# Patient Record
Sex: Female | Born: 1945 | Race: White | Hispanic: No | Marital: Married | Smoking: Former smoker
Health system: Southern US, Community
[De-identification: ages and names within clinical notes are randomized; demographics above are authoritative.]

## PROBLEM LIST (undated history)

## (undated) DIAGNOSIS — M545 Low back pain, unspecified: Secondary | ICD-10-CM

## (undated) DIAGNOSIS — M797 Fibromyalgia: Secondary | ICD-10-CM

## (undated) DIAGNOSIS — Z923 Personal history of irradiation: Secondary | ICD-10-CM

## (undated) DIAGNOSIS — I341 Nonrheumatic mitral (valve) prolapse: Secondary | ICD-10-CM

## (undated) DIAGNOSIS — E785 Hyperlipidemia, unspecified: Secondary | ICD-10-CM

## (undated) DIAGNOSIS — J40 Bronchitis, not specified as acute or chronic: Secondary | ICD-10-CM

## (undated) DIAGNOSIS — M5136 Other intervertebral disc degeneration, lumbar region: Secondary | ICD-10-CM

## (undated) DIAGNOSIS — M810 Age-related osteoporosis without current pathological fracture: Secondary | ICD-10-CM

## (undated) DIAGNOSIS — M5124 Other intervertebral disc displacement, thoracic region: Secondary | ICD-10-CM

## (undated) DIAGNOSIS — M1990 Unspecified osteoarthritis, unspecified site: Secondary | ICD-10-CM

## (undated) DIAGNOSIS — U071 COVID-19: Secondary | ICD-10-CM

## (undated) DIAGNOSIS — M51369 Other intervertebral disc degeneration, lumbar region without mention of lumbar back pain or lower extremity pain: Secondary | ICD-10-CM

## (undated) DIAGNOSIS — N183 Chronic kidney disease, stage 3 unspecified: Secondary | ICD-10-CM

## (undated) DIAGNOSIS — N3090 Cystitis, unspecified without hematuria: Secondary | ICD-10-CM

## (undated) DIAGNOSIS — J189 Pneumonia, unspecified organism: Secondary | ICD-10-CM

## (undated) DIAGNOSIS — C50919 Malignant neoplasm of unspecified site of unspecified female breast: Secondary | ICD-10-CM

## (undated) DIAGNOSIS — H269 Unspecified cataract: Secondary | ICD-10-CM

## (undated) DIAGNOSIS — G8929 Other chronic pain: Secondary | ICD-10-CM

## (undated) HISTORY — DX: Fibromyalgia: M79.7

## (undated) HISTORY — DX: Pneumonia, unspecified organism: J18.9

## (undated) HISTORY — DX: Other intervertebral disc displacement, thoracic region: M51.24

## (undated) HISTORY — DX: Cystitis, unspecified without hematuria: N30.90

## (undated) HISTORY — DX: Low back pain: M54.5

## (undated) HISTORY — DX: Unspecified cataract: H26.9

## (undated) HISTORY — DX: Bronchitis, not specified as acute or chronic: J40

## (undated) HISTORY — PX: BREAST LUMPECTOMY: SHX2

## (undated) HISTORY — PX: FOOT SURGERY: SHX648

## (undated) HISTORY — DX: Other intervertebral disc degeneration, lumbar region: M51.36

## (undated) HISTORY — DX: Unspecified osteoarthritis, unspecified site: M19.90

## (undated) HISTORY — DX: Other intervertebral disc degeneration, lumbar region without mention of lumbar back pain or lower extremity pain: M51.369

## (undated) HISTORY — DX: Chronic kidney disease, stage 3 unspecified: N18.30

## (undated) HISTORY — DX: Malignant neoplasm of unspecified site of unspecified female breast: C50.919

## (undated) HISTORY — DX: Age-related osteoporosis without current pathological fracture: M81.0

## (undated) HISTORY — DX: COVID-19: U07.1

## (undated) HISTORY — PX: OTHER SURGICAL HISTORY: SHX169

## (undated) HISTORY — DX: Hyperlipidemia, unspecified: E78.5

## (undated) HISTORY — PX: EYE SURGERY: SHX253

## (undated) HISTORY — DX: Low back pain, unspecified: M54.50

## (undated) HISTORY — DX: Other chronic pain: G89.29

---

## 1988-12-19 HISTORY — PX: ABDOMINAL HYSTERECTOMY: SHX81

## 2001-03-21 ENCOUNTER — Encounter: Payer: Self-pay | Admitting: Obstetrics and Gynecology

## 2001-03-21 ENCOUNTER — Encounter: Admission: RE | Admit: 2001-03-21 | Discharge: 2001-03-21 | Payer: Self-pay | Admitting: Obstetrics and Gynecology

## 2004-07-05 ENCOUNTER — Encounter: Admission: RE | Admit: 2004-07-05 | Discharge: 2004-07-05 | Payer: Self-pay | Admitting: Internal Medicine

## 2004-11-05 ENCOUNTER — Other Ambulatory Visit: Admission: RE | Admit: 2004-11-05 | Discharge: 2004-11-05 | Payer: Self-pay | Admitting: Family Medicine

## 2004-11-05 ENCOUNTER — Ambulatory Visit: Payer: Self-pay | Admitting: Family Medicine

## 2005-01-11 ENCOUNTER — Ambulatory Visit: Payer: Self-pay | Admitting: Family Medicine

## 2005-05-19 ENCOUNTER — Ambulatory Visit: Payer: Self-pay | Admitting: Internal Medicine

## 2005-05-27 ENCOUNTER — Ambulatory Visit: Payer: Self-pay | Admitting: Family Medicine

## 2005-05-31 ENCOUNTER — Ambulatory Visit: Payer: Self-pay | Admitting: Internal Medicine

## 2005-06-09 ENCOUNTER — Ambulatory Visit: Payer: Self-pay | Admitting: Internal Medicine

## 2005-10-24 ENCOUNTER — Encounter: Admission: RE | Admit: 2005-10-24 | Discharge: 2005-10-24 | Payer: Self-pay | Admitting: Internal Medicine

## 2006-07-18 ENCOUNTER — Ambulatory Visit: Payer: Self-pay | Admitting: Internal Medicine

## 2007-03-05 ENCOUNTER — Encounter: Admission: RE | Admit: 2007-03-05 | Discharge: 2007-03-05 | Payer: Self-pay | Admitting: Internal Medicine

## 2007-03-14 ENCOUNTER — Ambulatory Visit: Payer: Self-pay | Admitting: Internal Medicine

## 2007-05-22 ENCOUNTER — Telehealth (INDEPENDENT_AMBULATORY_CARE_PROVIDER_SITE_OTHER): Payer: Self-pay | Admitting: *Deleted

## 2007-05-22 ENCOUNTER — Emergency Department (HOSPITAL_COMMUNITY): Admission: EM | Admit: 2007-05-22 | Discharge: 2007-05-22 | Payer: Self-pay | Admitting: Emergency Medicine

## 2007-05-23 ENCOUNTER — Ambulatory Visit: Payer: Self-pay | Admitting: Internal Medicine

## 2007-05-23 ENCOUNTER — Telehealth (INDEPENDENT_AMBULATORY_CARE_PROVIDER_SITE_OTHER): Payer: Self-pay | Admitting: *Deleted

## 2007-05-24 DIAGNOSIS — M545 Low back pain, unspecified: Secondary | ICD-10-CM | POA: Insufficient documentation

## 2007-05-24 DIAGNOSIS — M797 Fibromyalgia: Secondary | ICD-10-CM | POA: Insufficient documentation

## 2007-09-04 ENCOUNTER — Telehealth (INDEPENDENT_AMBULATORY_CARE_PROVIDER_SITE_OTHER): Payer: Self-pay | Admitting: *Deleted

## 2007-09-28 ENCOUNTER — Ambulatory Visit: Payer: Self-pay | Admitting: Internal Medicine

## 2007-09-28 DIAGNOSIS — E782 Mixed hyperlipidemia: Secondary | ICD-10-CM | POA: Insufficient documentation

## 2007-09-30 LAB — CONVERTED CEMR LAB
AST: 16 units/L (ref 0–37)
Albumin: 4.5 g/dL (ref 3.5–5.2)
CO2: 28 meq/L (ref 19–32)
Calcium: 9.3 mg/dL (ref 8.4–10.5)
Cholesterol: 235 mg/dL (ref 0–200)
GFR calc Af Amer: 131 mL/min
GFR calc non Af Amer: 108 mL/min
Glucose, Bld: 89 mg/dL (ref 70–99)
HDL: 69.7 mg/dL (ref >39.0)
Hemoglobin: 13 g/dL (ref 12.0–15.0)
Lymphocytes Relative: 33.7 % (ref 12.0–46.0)
MCHC: 35.2 g/dL (ref 30.0–36.0)
MCV: 90 fL (ref 78.0–100.0)
Neutro Abs: 2.5 10*3/uL (ref 1.4–7.7)
RDW: 12.5 % (ref 11.5–14.6)
Sodium: 141 meq/L (ref 135–145)
TSH: 1.88 microintl units/mL (ref 0.35–5.50)
Total Bilirubin: 0.9 mg/dL (ref 0.3–1.2)
Total CHOL/HDL Ratio: 3.4
VLDL: 17 mg/dL (ref 0–40)

## 2007-10-01 ENCOUNTER — Encounter (INDEPENDENT_AMBULATORY_CARE_PROVIDER_SITE_OTHER): Payer: Self-pay | Admitting: *Deleted

## 2007-10-08 ENCOUNTER — Encounter (INDEPENDENT_AMBULATORY_CARE_PROVIDER_SITE_OTHER): Payer: Self-pay | Admitting: *Deleted

## 2007-10-12 ENCOUNTER — Ambulatory Visit: Payer: Self-pay | Admitting: Internal Medicine

## 2008-01-03 ENCOUNTER — Telehealth (INDEPENDENT_AMBULATORY_CARE_PROVIDER_SITE_OTHER): Payer: Self-pay | Admitting: *Deleted

## 2008-03-25 ENCOUNTER — Ambulatory Visit: Payer: Self-pay | Admitting: Internal Medicine

## 2008-03-25 DIAGNOSIS — M48061 Spinal stenosis, lumbar region without neurogenic claudication: Secondary | ICD-10-CM | POA: Insufficient documentation

## 2008-03-25 LAB — CONVERTED CEMR LAB
Bilirubin Urine: NEGATIVE
Blood in Urine, dipstick: NEGATIVE
Glucose, Urine, Semiquant: NEGATIVE
Protein, U semiquant: NEGATIVE
Specific Gravity, Urine: 1.015
WBC Urine, dipstick: NEGATIVE

## 2008-07-16 ENCOUNTER — Encounter: Admission: RE | Admit: 2008-07-16 | Discharge: 2008-07-16 | Payer: Self-pay | Admitting: Internal Medicine

## 2008-07-18 ENCOUNTER — Encounter: Payer: Self-pay | Admitting: Internal Medicine

## 2008-07-24 ENCOUNTER — Encounter: Admission: RE | Admit: 2008-07-24 | Discharge: 2008-07-24 | Payer: Self-pay | Admitting: Internal Medicine

## 2008-10-13 ENCOUNTER — Encounter: Payer: Self-pay | Admitting: Internal Medicine

## 2008-11-21 ENCOUNTER — Ambulatory Visit: Payer: Self-pay | Admitting: Vascular Surgery

## 2008-12-19 HISTORY — PX: BREAST SURGERY: SHX581

## 2008-12-19 HISTORY — PX: CYSTECTOMY: SUR359

## 2009-02-12 ENCOUNTER — Ambulatory Visit: Payer: Self-pay | Admitting: Internal Medicine

## 2009-02-12 LAB — CONVERTED CEMR LAB: LDL Goal: 130 mg/dL

## 2009-02-23 LAB — CONVERTED CEMR LAB
ALT: 16 units/L (ref 0–35)
AST: 19 units/L (ref 0–37)
Albumin: 4.3 g/dL (ref 3.5–5.2)
CO2: 28 meq/L (ref 19–32)
Chloride: 105 meq/L (ref 96–112)
Creatinine, Ser: 0.9 mg/dL (ref 0.4–1.2)
Direct LDL: 135.5 mg/dL
GFR calc Af Amer: 82 mL/min
MCHC: 34.4 g/dL (ref 30.0–36.0)
Monocytes Absolute: 0.3 10*3/uL (ref 0.1–1.0)
Monocytes Relative: 7.9 % (ref 3.0–12.0)
Neutrophils Relative %: 47.9 % (ref 43.0–77.0)
Potassium: 4.4 meq/L (ref 3.5–5.1)
RBC: 4.18 M/uL (ref 3.87–5.11)
RDW: 12.8 % (ref 11.5–14.6)
Total CHOL/HDL Ratio: 3.9
Triglycerides: 123 mg/dL (ref 0–149)
VLDL: 25 mg/dL (ref 0–40)
Vit D, 25-Hydroxy: 18 ng/mL — ABNORMAL LOW (ref 30–89)
WBC: 3.7 10*3/uL — ABNORMAL LOW (ref 4.5–10.5)

## 2009-02-25 ENCOUNTER — Encounter (INDEPENDENT_AMBULATORY_CARE_PROVIDER_SITE_OTHER): Payer: Self-pay | Admitting: *Deleted

## 2009-02-25 ENCOUNTER — Telehealth (INDEPENDENT_AMBULATORY_CARE_PROVIDER_SITE_OTHER): Payer: Self-pay | Admitting: *Deleted

## 2009-08-26 ENCOUNTER — Encounter: Payer: Self-pay | Admitting: Internal Medicine

## 2009-09-17 ENCOUNTER — Telehealth (INDEPENDENT_AMBULATORY_CARE_PROVIDER_SITE_OTHER): Payer: Self-pay | Admitting: *Deleted

## 2009-09-23 ENCOUNTER — Encounter (INDEPENDENT_AMBULATORY_CARE_PROVIDER_SITE_OTHER): Payer: Self-pay | Admitting: Surgery

## 2009-09-23 ENCOUNTER — Ambulatory Visit (HOSPITAL_BASED_OUTPATIENT_CLINIC_OR_DEPARTMENT_OTHER): Admission: RE | Admit: 2009-09-23 | Discharge: 2009-09-23 | Payer: Self-pay | Admitting: Surgery

## 2009-09-28 ENCOUNTER — Encounter: Payer: Self-pay | Admitting: Internal Medicine

## 2009-10-02 ENCOUNTER — Encounter: Admission: RE | Admit: 2009-10-02 | Discharge: 2009-10-02 | Payer: Self-pay | Admitting: Surgery

## 2009-10-05 ENCOUNTER — Encounter: Admission: RE | Admit: 2009-10-05 | Discharge: 2009-10-05 | Payer: Self-pay | Admitting: Surgery

## 2009-10-13 ENCOUNTER — Encounter: Payer: Self-pay | Admitting: Internal Medicine

## 2009-10-14 ENCOUNTER — Ambulatory Visit: Admission: RE | Admit: 2009-10-14 | Discharge: 2009-12-18 | Payer: Self-pay | Admitting: Radiation Oncology

## 2009-10-15 ENCOUNTER — Encounter: Payer: Self-pay | Admitting: Internal Medicine

## 2009-10-21 ENCOUNTER — Encounter (INDEPENDENT_AMBULATORY_CARE_PROVIDER_SITE_OTHER): Payer: Self-pay | Admitting: Surgery

## 2009-10-21 ENCOUNTER — Ambulatory Visit (HOSPITAL_BASED_OUTPATIENT_CLINIC_OR_DEPARTMENT_OTHER): Admission: RE | Admit: 2009-10-21 | Discharge: 2009-10-21 | Payer: Self-pay | Admitting: Surgery

## 2009-10-26 ENCOUNTER — Encounter: Payer: Self-pay | Admitting: Internal Medicine

## 2009-10-28 ENCOUNTER — Ambulatory Visit (HOSPITAL_COMMUNITY): Admission: RE | Admit: 2009-10-28 | Discharge: 2009-10-28 | Payer: Self-pay | Admitting: Oncology

## 2009-10-28 ENCOUNTER — Ambulatory Visit: Payer: Self-pay | Admitting: Oncology

## 2009-10-28 ENCOUNTER — Encounter: Payer: Self-pay | Admitting: Internal Medicine

## 2009-10-28 LAB — CBC WITH DIFFERENTIAL/PLATELET
Basophils Absolute: 0 10*3/uL (ref 0.0–0.1)
HCT: 36 % (ref 34.8–46.6)
HGB: 12.3 g/dL (ref 11.6–15.9)
MONO#: 0.4 10*3/uL (ref 0.1–0.9)
NEUT#: 2.8 10*3/uL (ref 1.5–6.5)
NEUT%: 59.4 % (ref 38.4–76.8)
WBC: 4.8 10*3/uL (ref 3.9–10.3)
lymph#: 1.4 10*3/uL (ref 0.9–3.3)

## 2009-10-29 LAB — COMPREHENSIVE METABOLIC PANEL
ALT: 15 U/L (ref 0–35)
Albumin: 4.6 g/dL (ref 3.5–5.2)
BUN: 18 mg/dL (ref 6–23)
CO2: 22 mEq/L (ref 19–32)
Calcium: 9.3 mg/dL (ref 8.4–10.5)
Chloride: 104 mEq/L (ref 96–112)
Creatinine, Ser: 0.96 mg/dL (ref 0.40–1.20)

## 2009-10-29 LAB — LACTATE DEHYDROGENASE: LDH: 137 U/L (ref 94–250)

## 2009-12-16 ENCOUNTER — Encounter: Payer: Self-pay | Admitting: Internal Medicine

## 2009-12-21 ENCOUNTER — Ambulatory Visit: Admission: RE | Admit: 2009-12-21 | Discharge: 2010-01-01 | Payer: Self-pay | Admitting: Radiation Oncology

## 2009-12-31 ENCOUNTER — Encounter: Payer: Self-pay | Admitting: Internal Medicine

## 2010-02-09 ENCOUNTER — Ambulatory Visit: Payer: Self-pay | Admitting: Internal Medicine

## 2010-02-09 ENCOUNTER — Encounter: Payer: Self-pay | Admitting: Internal Medicine

## 2010-03-09 ENCOUNTER — Ambulatory Visit: Payer: Self-pay | Admitting: Oncology

## 2010-03-11 ENCOUNTER — Encounter: Payer: Self-pay | Admitting: Internal Medicine

## 2010-03-16 ENCOUNTER — Ambulatory Visit: Payer: Self-pay | Admitting: Internal Medicine

## 2010-03-16 LAB — CONVERTED CEMR LAB
AST: 22 units/L (ref 0–37)
Albumin: 4.5 g/dL (ref 3.5–5.2)
Alkaline Phosphatase: 58 units/L (ref 39–117)
Basophils Relative: 1.1 % (ref 0.0–3.0)
Bilirubin Urine: NEGATIVE
Chloride: 106 meq/L (ref 96–112)
Direct LDL: 151 mg/dL
Eosinophils Relative: 2.8 % (ref 0.0–5.0)
GFR calc non Af Amer: 59.51 mL/min (ref >60)
Glucose, Bld: 94 mg/dL (ref 70–99)
HCT: 38.9 % (ref 36.0–46.0)
HDL: 74.3 mg/dL (ref >39.00)
Hemoglobin: 13.1 g/dL (ref 12.0–15.0)
Ketones, urine, test strip: NEGATIVE
MCHC: 33.5 g/dL (ref 30.0–36.0)
Monocytes Absolute: 0.3 10*3/uL (ref 0.1–1.0)
Neutro Abs: 1.7 10*3/uL (ref 1.4–7.7)
Neutrophils Relative %: 61.2 % (ref 43.0–77.0)
RBC: 4.13 M/uL (ref 3.87–5.11)
Total Bilirubin: 0.5 mg/dL (ref 0.3–1.2)
Total CHOL/HDL Ratio: 3
Total Protein: 7.4 g/dL (ref 6.0–8.3)
Urobilinogen, UA: 0.2
VLDL: 20.6 mg/dL (ref 0.0–40.0)

## 2010-03-22 ENCOUNTER — Ambulatory Visit: Payer: Self-pay | Admitting: Internal Medicine

## 2010-03-22 DIAGNOSIS — Z853 Personal history of malignant neoplasm of breast: Secondary | ICD-10-CM | POA: Insufficient documentation

## 2010-03-22 DIAGNOSIS — E559 Vitamin D deficiency, unspecified: Secondary | ICD-10-CM | POA: Insufficient documentation

## 2010-05-07 ENCOUNTER — Ambulatory Visit: Payer: Self-pay | Admitting: Internal Medicine

## 2010-05-07 DIAGNOSIS — R21 Rash and other nonspecific skin eruption: Secondary | ICD-10-CM | POA: Insufficient documentation

## 2010-05-28 ENCOUNTER — Telehealth (INDEPENDENT_AMBULATORY_CARE_PROVIDER_SITE_OTHER): Payer: Self-pay | Admitting: *Deleted

## 2010-06-01 ENCOUNTER — Ambulatory Visit: Payer: Self-pay | Admitting: Oncology

## 2010-06-03 LAB — CBC WITH DIFFERENTIAL/PLATELET
BASO%: 1 % (ref 0.0–2.0)
Eosinophils Absolute: 0.1 10*3/uL (ref 0.0–0.5)
HCT: 37.3 % (ref 34.8–46.6)
LYMPH%: 23.5 % (ref 14.0–49.7)
MCHC: 34.5 g/dL (ref 31.5–36.0)
MONO#: 0.3 10*3/uL (ref 0.1–0.9)
MONO%: 6.8 % (ref 0.0–14.0)
NEUT%: 67 % (ref 38.4–76.8)
Platelets: 227 10*3/uL (ref 145–400)
RBC: 4.03 10*6/uL (ref 3.70–5.45)
RDW: 13.8 % (ref 11.2–14.5)
lymph#: 1.2 10*3/uL (ref 0.9–3.3)

## 2010-06-03 LAB — COMPREHENSIVE METABOLIC PANEL
ALT: 17 U/L (ref 0–35)
Albumin: 4.5 g/dL (ref 3.5–5.2)
Alkaline Phosphatase: 55 U/L (ref 39–117)
CO2: 22 mEq/L (ref 19–32)
Chloride: 105 mEq/L (ref 96–112)
Creatinine, Ser: 0.89 mg/dL (ref 0.40–1.20)
Potassium: 4.4 mEq/L (ref 3.5–5.3)
Sodium: 140 mEq/L (ref 135–145)
Total Protein: 6.9 g/dL (ref 6.0–8.3)

## 2010-06-09 ENCOUNTER — Encounter: Payer: Self-pay | Admitting: Internal Medicine

## 2010-06-10 ENCOUNTER — Telehealth: Payer: Self-pay | Admitting: Internal Medicine

## 2010-06-29 ENCOUNTER — Encounter: Payer: Self-pay | Admitting: Internal Medicine

## 2010-07-05 ENCOUNTER — Telehealth (INDEPENDENT_AMBULATORY_CARE_PROVIDER_SITE_OTHER): Payer: Self-pay | Admitting: *Deleted

## 2010-09-09 ENCOUNTER — Telehealth (INDEPENDENT_AMBULATORY_CARE_PROVIDER_SITE_OTHER): Payer: Self-pay | Admitting: *Deleted

## 2010-09-10 ENCOUNTER — Ambulatory Visit: Payer: Self-pay | Admitting: Internal Medicine

## 2010-09-10 DIAGNOSIS — R079 Chest pain, unspecified: Secondary | ICD-10-CM | POA: Insufficient documentation

## 2010-09-13 LAB — CONVERTED CEMR LAB
CK-MB: 1.1 ng/mL (ref 0.3–4.0)
Relative Index: 1.8 (ref 0.0–2.5)

## 2010-09-24 ENCOUNTER — Telehealth: Payer: Self-pay | Admitting: Internal Medicine

## 2010-09-28 ENCOUNTER — Encounter (INDEPENDENT_AMBULATORY_CARE_PROVIDER_SITE_OTHER): Payer: Self-pay | Admitting: *Deleted

## 2010-10-04 ENCOUNTER — Encounter: Admission: RE | Admit: 2010-10-04 | Discharge: 2010-10-04 | Payer: Self-pay | Admitting: Internal Medicine

## 2010-10-05 ENCOUNTER — Ambulatory Visit: Payer: Self-pay | Admitting: Internal Medicine

## 2010-10-05 LAB — CONVERTED CEMR LAB
Bilirubin Urine: NEGATIVE
Blood in Urine, dipstick: NEGATIVE
Glucose, Urine, Semiquant: NEGATIVE
Ketones, urine, test strip: NEGATIVE
Specific Gravity, Urine: 1.01
Urobilinogen, UA: 0.2

## 2010-10-06 ENCOUNTER — Encounter: Payer: Self-pay | Admitting: Internal Medicine

## 2010-10-08 ENCOUNTER — Telehealth (INDEPENDENT_AMBULATORY_CARE_PROVIDER_SITE_OTHER): Payer: Self-pay | Admitting: *Deleted

## 2010-10-29 ENCOUNTER — Ambulatory Visit: Payer: Self-pay | Admitting: Oncology

## 2010-11-02 LAB — CBC WITH DIFFERENTIAL/PLATELET
LYMPH%: 19.8 % (ref 14.0–49.7)
MCHC: 34.8 g/dL (ref 31.5–36.0)
MONO%: 6.2 % (ref 0.0–14.0)
NEUT#: 4.3 10*3/uL (ref 1.5–6.5)
RDW: 13 % (ref 11.2–14.5)
WBC: 6.2 10*3/uL (ref 3.9–10.3)
lymph#: 1.2 10*3/uL (ref 0.9–3.3)

## 2010-11-02 LAB — COMPREHENSIVE METABOLIC PANEL
Alkaline Phosphatase: 72 U/L (ref 39–117)
Calcium: 9.3 mg/dL (ref 8.4–10.5)
Glucose, Bld: 96 mg/dL (ref 70–99)
Sodium: 138 mEq/L (ref 135–145)

## 2010-11-05 ENCOUNTER — Encounter: Payer: Self-pay | Admitting: Cardiology

## 2010-11-08 ENCOUNTER — Ambulatory Visit: Payer: Self-pay | Admitting: Cardiology

## 2010-11-09 ENCOUNTER — Encounter: Payer: Self-pay | Admitting: Cardiology

## 2010-12-24 ENCOUNTER — Telehealth: Payer: Self-pay | Admitting: Internal Medicine

## 2011-01-08 ENCOUNTER — Encounter: Payer: Self-pay | Admitting: Oncology

## 2011-01-18 NOTE — Letter (Signed)
Summary: [facility]  [facility]   Imported By: Lanelle Bal 01/05/2010 09:35:23  _____________________________________________________________________  External Attachment:    Type:   Image     Comment:   External Document

## 2011-01-18 NOTE — Progress Notes (Signed)
Summary: Culture Results  Phone Note Outgoing Call   Call placed by: Shonna Chock CMA,  October 08, 2010 4:42 PM Call placed to: Patient Summary of Call: Left message on Voice mail informing patient:  To diagnose a true UTI the criteria are: > 100,000 colonies of a single organism .Multiple types present & only 50,000 colonies. Start antibiotics if symptoms progress while out of town . Otherwise reculture upon return if symptoms persist. Hopp  Patient to call if any questions or concerns./[facility] CMA  October 08, 2010 4:42 PM

## 2011-01-18 NOTE — Assessment & Plan Note (Signed)
Summary: rash behind rt ear and under rt breast//lch   Vital Signs:  Patient profile:   65 year old female Weight:      128.4 pounds Temp:     98.6 degrees F oral Pulse rate:   64 / minute Resp:     17 per minute BP sitting:   110 / 66  (left arm) Cuff size:   regular  Vitals Entered By: Shonna Chock (May 07, 2010 11:58 AM) CC: Rash x 1 week behind right ear and under right breast Comments REVIEWED MED LIST, PATIENT AGREED DOSE AND INSTRUCTION CORRECT    CC:  Rash x 1 week behind right ear and under right breast.  History of Present Illness: She has had a non painful , pruritic  rash in R retroauricular area & R lateral chest X 1 week w/o exposure or trigger.Rx: none .It progresses through day & affects sleep, awakening her. No constitutional symptoms. Anti -histamines cause hyperactivity.  Allergies: 1)  ! Morphine 2)  ! * Actonel 3)  ! * Boniva 4)  ! * Albuterol  Review of Systems Resp:  Denies cough, shortness of breath, and wheezing. Allergy:  Complains of sneezing; denies itching eyes; No angioedema.  Physical Exam  General:  well-nourished,in no acute distress; alert,appropriate and cooperative throughout examination Abdomen:  Bowel sounds positive,abdomen soft and non-tender without masses, organomegaly or hernias noted. Skin:  Minimal erythema R inframammary area  & R retroauricular. Mild dermatographia Cervical Nodes:  No lymphadenopathy noted Axillary Nodes:  No palpable lymphadenopathy   Impression & Recommendations:  Problem # 1:  RASH-NONVESICULAR (ICD-782.1)  pruritic  Her updated medication list for this problem includes:    Mometasone Furoate 0.1 % Crea (Mometasone furoate) .Marland Kitchen... Apply two times a day  Complete Medication List: 1)  Trazodone Hcl 50 Mg Tabs (Trazodone hcl) .... 3 tabs daily 2)  Tylenol 2 Tabs Qhs  3)  Relpax 20 Mg Tabs (Eletriptan hydrobromide) .... Take as directed for headache 4)  Acetaminophen-codeine #3 300-30 Mg Tabs  (Acetaminophen-codeine) .... As needed q 6-8 hrs 5)  Vitamin D3 50000 Unit Caps (cholecalciferol)  .Marland Kitchen.. 1 capsule once weekly 6)  Tamoxifen Citrate 10 Mg Tabs (Tamoxifen citrate) .Marland Kitchen.. 1 by mouth once daily 7)  Mometasone Furoate 0.1 % Crea (Mometasone furoate) .... Apply two times a day  Patient Instructions: 1)  T- gel coal tar shampoo every other day until scalp clear. Singulair samples 10 mg once daily until skin clear. Prescriptions: MOMETASONE FUROATE 0.1 % CREA (MOMETASONE FUROATE) apply two times a day  #30 grams x 0   Entered and Authorized by:   Marga Melnick MD   Signed by:   Marga Melnick MD on 05/07/2010   Method used:   Faxed to ...       [facility] (retail)       [address]       [place], [place]  [address]       Ph: [phone] or [phone]       Fax: [phone]   RxID:   [id number]

## 2011-01-18 NOTE — Progress Notes (Signed)
Summary: CARDIOLOGY REFERRAL  Phone Note Outgoing Call   Call placed by: Magdalen Spatz [facility],  September 24, 2010 4:18 PM Call placed to: Patient Summary of Call: IN REFERENCE TO CARDIOLOGY REFERRAL.......I CALLED TO INFORM PT OF APPT W/DR [facility] FOR CHEST PAIN, PT STATES SINCE ALL THE OTHER TESTS WERE FINE, SHE DOESN'T WANT THIS REFERRAL & ASKED ME TO CANCEL THE APPT.  Magdalen Spatz [facility]  September 24, 2010 4:17 PM   Follow-up for Phone Call        please tell her I replied between sessions @ my medical conference in [place] that I feel very strongly that  she should have this evaluation. Dr Shirlee Latch is  a Engineer, production . Follow-up by: Marga Melnick MD,  September 24, 2010 4:21 PM  Additional Follow-up for Phone Call Additional follow up Details #1::        I called patient back, told her exactly what Dr. Alwyn Ren said above, patient was silent, the asked if she could call me back on Monday morning, 09-27-2010, I agreed. Additional Follow-up by: Magdalen Spatz [facility],  September 24, 2010 4:42 PM    Additional Follow-up for Phone Call Additional follow up Details #2::    thanks Follow-up by: Marga Melnick MD,  September 24, 2010 7:11 PM  Additional Follow-up for Phone Call Additional follow up Details #3:: Details for Additional Follow-up Action Taken: I spoke with patient who has decided she will be seen for a consult only at Cardiology, but states she will not have any procedures.  I will schedule pt for a consult w/Dr. Shirlee Latch.( Noted. [facility]) Additional Follow-up by: Magdalen Spatz [facility],  September 27, 2010 4:59 PM

## 2011-01-18 NOTE — Letter (Signed)
Summary: [facility]   Imported By: Lanelle Bal 01/11/2010 09:45:48  _____________________________________________________________________  External Attachment:    Type:   Image     Comment:   External Document

## 2011-01-18 NOTE — Assessment & Plan Note (Signed)
Summary: CHEST TIGHTNESS/CDJ   Vital Signs:  Patient profile:   65 year old female Weight:      128.2 pounds O2 Sat:      98 % on Room air Temp:     98.7 degrees F oral Pulse rate:   78 / minute Resp:     14 per minute BP sitting:   108 / 64  (left arm) Cuff size:   regular  Vitals Entered By: Shonna Chock CMA (September 10, 2010 10:37 AM)  O2 Flow:  Room air CC: Chest tightness off/on x 2 weeks   CC:  Chest tightness off/on x 2 weeks.  History of Present Illness:      This is a 65 year old female who presents with Chest pain X 2 weeks.  The patient reports resting chest pain, but denies nausea, vomiting, diaphoresis, shortness of breath, palpitations, dizziness, light headedness, syncope, and indigestion.  The pain is described as intermittent and  "squeezing".  The pain is located in the substernal area.  The pain radiates to the jaw.  Episodes of chest pain last 2-5 minutes.  The pain  has no triggers ;it has occurred while driving  .It woke her  @ 6:30 am 09/09/2010 & lasted hours.She has not treated the pain. The last episode was mid day 09/22.Strong FH CAD; her father had MI @ 80. PMH of dyslipidemia ;meds have been declined in favor of TLC.LDL goal = < 130 based on NMR Lipoprofile.CVE as walking 25 min 5X/week w/o symptoms.  Current Medications (verified): 1)  Trazodone Hcl 50 Mg Tabs (Trazodone Hcl) .... 3 Tabs Daily 2)  Tylenol 2 Tabs Qhs 3)  Relpax 20 Mg  Tabs (Eletriptan Hydrobromide) .... Take As Directed For Headache 4)  Acetaminophen-Codeine #3 300-30 Mg Tabs (Acetaminophen-Codeine) .... As Needed Q 6-8 Hrs 5)  Vitamin D3 50000 Unit Caps (Cholecalciferol) .Marland Kitchen.. 1 Capsule Once Weekly 6)  Tamoxifen Citrate 10 Mg Tabs (Tamoxifen Citrate) .Marland Kitchen.. 1 By Mouth Once Daily 7)  Mometasone Furoate 0.1 % Crea (Mometasone Furoate) .... Apply Two Times A Day 8)  Cyclobenzaprine Hcl 10 Mg Tabs (Cyclobenzaprine Hcl) .Marland Kitchen.. 1 Two Times A Day & At Bedtime As Needed  For Back Pain 9)  Tramadol  Hcl 50 Mg Tabs (Tramadol Hcl) .Marland Kitchen.. 1 Every 6 Hrs As Needed For Back Apin  Allergies: 1)  ! Morphine 2)  ! * Actonel 3)  ! * Boniva 4)  ! * Albuterol  Past History:  Past Medical History: Low back pain, chronic Bulging  Disc & Spinal Stenosis , Dr Venetia Maxon ;Fibromyalgia Osteopenia:T score -2.4 @ L femoral neck, -0.9 @ spine 02/09/2010; bisphosphonate & calcium  intolerance(Note: Tamoxifen started 02/2010, Dr Darnelle Catalan) Breast cancer, PMH of Vitamin D deficiency(vit D 18 in 01/2009) Hyperlipidemia:NMR Lipoprofile  : LDL 157(1618/664),TG 95, HDL 75. LDL goal= < 130; FH CAD  Review of Systems ENT:  Denies difficulty swallowing and hoarseness. Resp:  Denies chest pain with inspiration, coughing up blood, shortness of breath, and wheezing. GI:  Denies bloody stools and dark tarry stools.  Physical Exam  General:  well-nourished,in no acute distress; alert,appropriate and cooperative throughout examination Eyes:  No corneal or conjunctival inflammation noted. No icterus Mouth:  Oral mucosa and oropharynx without lesions or exudates.  Teeth in good repair. No pharyngeal erythema.   Lungs:  Normal respiratory effort, chest expands symmetrically. Lungs are clear to auscultation, no crackles or wheezes. Heart:  Normal rate and regular rhythm. S1 and S2 normal without gallop, murmur,  click, rub .Soft S4 Abdomen:  Bowel sounds positive,abdomen soft  but minimally tender  periumbilical  area without masses, organomegaly or hernias noted. Pulses:  R and L carotid,radial,dorsalis pedis and posterior tibial pulses are full and equal bilaterally Extremities:  No clubbing, cyanosis, edema. Negative Homan's Neurologic:  alert & oriented X3.   Skin:  Intact without suspicious lesions or rashes Psych:  memory intact for recent and remote, normally interactive, good eye contact, and not anxious appearing.     Impression & Recommendations:  Problem # 1:  CHEST PAIN (ICD-786.50)  Orders: EKG w/  Interpretation (93000) Venipuncture (25956) TLB-Cardiac Panel ([id number]) T-Troponin I ([id number]) T- * Misc. Laboratory test [phone]) Cardiology Referral (Cardiology) Misc. Referral (Misc. Ref)  Problem # 2:  HYPERLIPIDEMIA (ICD-272.2)  Problem # 3:  CORONARY ARTERY DISEASE, FAMILY HX (ICD-V17.3)  Complete Medication List: 1)  Trazodone Hcl 50 Mg Tabs (Trazodone hcl) .... 3 tabs daily 2)  Tylenol 2 Tabs Qhs  3)  Relpax 20 Mg Tabs (Eletriptan hydrobromide) .... Take as directed for headache 4)  Acetaminophen-codeine #3 300-30 Mg Tabs (Acetaminophen-codeine) .... As needed q 6-8 hrs 5)  Vitamin D3 50000 Unit Caps (cholecalciferol)  .Marland Kitchen.. 1 capsule once weekly 6)  Tamoxifen Citrate 10 Mg Tabs (Tamoxifen citrate) .Marland Kitchen.. 1 by mouth once daily 7)  Mometasone Furoate 0.1 % Crea (Mometasone furoate) .... Apply two times a day 8)  Cyclobenzaprine Hcl 10 Mg Tabs (Cyclobenzaprine hcl) .Marland Kitchen.. 1 two times a day & at bedtime as needed  for back pain 9)  Tramadol Hcl 50 Mg Tabs (Tramadol hcl) .Marland Kitchen.. 1 every 6 hrs as needed for back apin 10)  Ranitidine Hcl 150 Mg Tabs (Ranitidine hcl) .Marland Kitchen.. 1 two times a day pre meals  Patient Instructions: 1)  To ER if pain recurs & persists. Hold exercise until the stress test is completed. 2)  Avoid foods high in acid (tomatoes, citrus juices, spicy foods). Avoid eating within two hours of lying down or before exercising. Do not over eat; try smaller more frequent meals. Elevate head of bed twelve inches when sleeping. Prescriptions: RANITIDINE HCL 150 MG TABS (RANITIDINE HCL) 1 two times a day pre meals  #60 x 0   Entered and Authorized by:   Marga Melnick MD   Signed by:   Marga Melnick MD on 09/10/2010   Method used:   Print then Give to Patient   RxID:   [id number]   Appended Document: CHEST TIGHTNESS/CDJ

## 2011-01-18 NOTE — Letter (Signed)
Summary: Primary Care Consult Scheduled Letter  [facility] [place]  [address] [place], [place] [address]   Phone: [phone]  Fax: [phone]      09/28/2010 MRN: [id number]  KATYANA TROLINGER [address] [place], [place]  [address]    Dear Ms. Mayford Knife,    We have scheduled an appointment for you.  At the recommendation of Dr. Marga Melnick, we have scheduled you a consult with Dr. Marca Ancona of [facility] on 10-19-2010 at 9:45am.  Their address is [address], [address] Floor, [place] [address]. The office phone number is [phone].  If this appointment day and time is not convenient for you, please feel free to call the office of the doctor you are being referred to at the number listed above and reschedule the appointment.    It is important for you to keep your scheduled appointments. We are here to make sure you are given good patient care.   Thank you,    Renee, Patient Care Coordinator [place] [facility]

## 2011-01-18 NOTE — Assessment & Plan Note (Signed)
Summary: cpx//lch   Vital Signs:  Patient profile:   65 year old female Height:      64.74 inches Weight:      129.8 pounds BMI:     21.85 Temp:     98.5 degrees F oral Pulse rate:   68 / minute Resp:     14 per minute BP sitting:   112 / 70  (left arm) Cuff size:   regular  Vitals Entered By: Shonna Chock (March 22, 2010 10:04 AM)  Comments REVIEWED MED LIST, PATIENT AGREED DOSE AND INSTRUCTION CORRECT    History of Present Illness: [name] is here for a physical; she has some issues related to prior beast surgery, possibly lymphedema as per Dr Darnelle Catalan.  Lipid Management History:      Positive NCEP/ATP III risk factors include female age 33 years old or older.  Negative NCEP/ATP III risk factors include non-diabetic, no family history for ischemic heart disease, non-tobacco-user status, non-hypertensive, no ASHD (atherosclerotic heart disease), no prior stroke/TIA, no peripheral vascular disease, and no history of aortic aneurysm.     Preventive Screening-Counseling & Management  Caffeine-Diet-Exercise     Does Patient Exercise: yes  Allergies: 1)  ! Morphine 2)  ! * Actonel 3)  ! * Boniva 4)  ! * Albuterol  Past History:  Past Medical History: Low back pain, chronic Bulging  Disc & Spinal Stenosis , Dr Venetia Maxon ;Fibromyalgia Osteopenia:T score -2.4 @ L femoral neck, -0.9 @ spine 02/09/2010; bisphosphonate & calcium  intolerance(Note: Tamoxifen started 02/2010, Dr Darnelle Catalan) Breast cancer, hx of Vitamin D deficiency(vit D 18 in 01/2009) Hyperlipidemia:NMR : LDL 157(1618/664),TG 95, HDL 75. LDL goal= < 130; FH CAD  Past Surgical History: Hysterectomy & USO for fibroid  1991 G2 P2  Lumpectomy 1991 benign  No colonoscopy; " I just don't  want one; I just don't want to take the  solution (prep) due to n& v with solution" SOC reviewed. Lumpectomy  with more extensive resection 2010 Dr Jamey Ripa; Radiation   Family History: Family History MI : F,P aunt,PGF,P  uncle,PGM Father: d 18, initial  MI @ 20 Mother: HTN, duodenal  ulcers Siblings:  bro HTN  Social History: Married Alcohol use-no Never Smoked Intermittent  heart healthy diet Regular exercise-yes: walks 5X/week  Review of Systems  The patient denies anorexia, fever, vision loss, decreased hearing, hoarseness, chest pain, syncope, dyspnea on exertion, peripheral edema, headaches, hemoptysis, abdominal pain, melena, hematochezia, severe indigestion/heartburn, hematuria, incontinence, muscle weakness, suspicious skin lesions, depression, unusual weight change, abnormal bleeding, enlarged lymph nodes, and angioedema.         Weight up 2 # post vacation. Cough with recent URI.  Physical Exam  General:  well-nourished; alert,appropriate and cooperative throughout examination Head:  Normocephalic and atraumatic without obvious abnormalities. . Eyes:  No corneal or conjunctival inflammation noted. Perrla. Funduscopic exam benign, without hemorrhages, exudates or papilledema.  Ears:  External ear exam shows no significant lesions or deformities.  Otoscopic examination reveals clear canals, tympanic membranes are intact bilaterally without bulging, retraction, inflammation or discharge. Hearing is grossly normal bilaterally. Nose:  External nasal examination shows no deformity or inflammation. Nasal mucosa are pink and moist without lesions or exudates. Mouth:  Oral mucosa and oropharynx without lesions or exudates.  Teeth in good repair. Neck:  No deformities, masses, or tenderness noted. Thyroid small & irregular Lungs:  Normal respiratory effort, chest expands symmetrically. Lungs are clear to auscultation, no crackles or wheezes. Heart:  Normal rate and  regular rhythm. S1 and S2 normal without gallop, murmur, click, rub . S4 Abdomen:  Bowel sounds positive,abdomen soft and non-tender without masses, organomegaly or hernias noted. Aorta palpable w/o AAA Genitalia:  to establish with Gyn Msk:   No deformity or scoliosis noted of thoracic or lumbar spine.   Pulses:  R and L carotid,radial,dorsalis pedis and posterior tibial pulses are full and equal bilaterally Extremities:  No clubbing, cyanosis, edema, or deformity noted with normal full range of motion of all joints.   Neurologic:  alert & oriented X3, strength normal in all extremities, and DTRs symmetrical and normal.   Skin:  Intact without suspicious lesions or rashes Cervical Nodes:  No lymphadenopathy noted Axillary Nodes:  No palpable lymphadenopathy Psych:  memory intact for recent and remote, normally interactive, and good eye contact.     Impression & Recommendations:  Problem # 1:  ROUTINE GENERAL MEDICAL EXAM@HEALTH  CARE FACL (ICD-V70.0)  Orders: EKG w/ Interpretation (93000)  Problem # 2:  BRONCHITIS-ACUTE (ICD-466.0) Resolving  Problem # 3:  OSTEOPENIA (ICD-733.90) Now on Tamoxifen  Problem # 4:  HYPERLIPIDEMIA (ICD-272.2)  Orders: EKG w/ Interpretation (93000)  Problem # 5:  VITAMIN D DEFICIENCY (ICD-268.9)  Orders: Venipuncture ([address]) T-Vitamin D (25-Hydroxy) (60454-09811)  Complete Medication List: 1)  Trazodone Hcl 50 Mg Tabs (Trazodone hcl) .... 3 tabs daily 2)  Tylenol 2 Tabs Qhs  3)  Relpax 20 Mg Tabs (Eletriptan hydrobromide) .... Take as directed for headache 4)  Acetaminophen-codeine #3 300-30 Mg Tabs (Acetaminophen-codeine) .... As needed q 6-8 hrs 5)  Vitamin D3 50000 Unit Caps (cholecalciferol)  .Marland Kitchen.. 1 capsule once weekly  Lipid Assessment/Plan:      Based on NCEP/ATP III, the patient's risk factor category is "0-1 risk factors".  The patient's lipid goals are as follows: Total cholesterol goal is 200; LDL cholesterol goal is 130; HDL cholesterol goal is 50; Triglyceride goal is 150.  Her LDL cholesterol goal has not been met.  Secondary causes for hyperlipidemia have been ruled out.  She has been counseled on adjunctive measures for lowering her cholesterol and has been provided with  dietary instructions.    Patient Instructions: 1)  Read Dr Gildardo Griffes Eat ,Drink & Be Healthy. 2)  Please schedule a follow-up appointment in 6 months. 3)  It is important that you exercise regularly at least 20 minutes 5 times a week. If you develop chest pain, have severe difficulty breathing, or feel very tired , stop exercising immediately and seek medical attention. 4)  Schedule a colonoscopy  to help detect colon cancer. 5)  Lipid Panel prior to visit, ICD-9:272.4 Prescriptions: RELPAX 20 MG  TABS (ELETRIPTAN HYDROBROMIDE) take as directed for headache  #6 Tablet x 5   Entered and Authorized by:   Sara Melnick MD   Signed by:   Sara Melnick MD on 03/22/2010   Method used:   Print then Give to Patient   RxID:   [id number] TRAZODONE HCL 50 MG TABS (TRAZODONE HCL) 3 tabs daily  #90 x 11   Entered and Authorized by:   Sara Melnick MD   Signed by:   Sara Melnick MD on 03/22/2010   Method used:   Print then Give to Patient   RxID:   [id number]

## 2011-01-18 NOTE — Letter (Signed)
[facility]   Imported By: Lanelle Bal 01/19/2010 08:03:07  _____________________________________________________________________  External Attachment:    Type:   Image     Comment:   External Document

## 2011-01-18 NOTE — Progress Notes (Signed)
Summary: Cold Sores  Phone Note Call from Patient Call back at Home Phone [phone]   Caller: Patient Summary of Call: Patient was in to see Dr. Alwyn Ren a few weeks ago. She woke up this morning with cold sores and would like to know if she can have some acyclovir to treat this for this weekend. She says she has a hard time with taking medicines but she know she can take this one.   Her pharmacy is [facility] on [address]. Initial call taken by: Harold Barban,  May 28, 2010 2:07 PM  Follow-up for Phone Call        This is not on med list-needs to be triaged Follow-up by: Shonna Chock,  May 28, 2010 2:08 PM  Additional Follow-up for Phone Call Additional follow up Details #1::        pt informed, she got med a while back from derm, and has some leftover and will try some OTC. if no better OV .Kandice Hams  May 28, 2010 2:22 PM   Additional Follow-up by: Kandice Hams,  May 28, 2010 2:22 PM

## 2011-01-18 NOTE — Assessment & Plan Note (Signed)
Summary: np6. chestpain. pt has bcbs. gd   Primary Provider:  Dr. Alwyn Ren   History of Present Illness: 65 yo with history of hyperlipidemia, breast cancer, and spinal stenosis presents for evaluation of chest tightness.  In September, patient had a number of episodes of chest tightness.  She would wake up in the early morning with moderate substernal chest tightness, lasting about 5-10 minutes at a time.  A few times, she had the tightness later during the day.  It was never related to exertion.  The symptoms lasted for about 1 week and then resolved completely.  She has had no problems since that time in September.  She exercises (aerobics, walking) without any dyspnea or chest pain.  She has been doing well recently.  In 9/11 at the time of the symptoms, she had a normal D dimer and cardiac enzymes.  She did not want further evaluation.    ECG: NSR, normal (9/11).  Refused ECG today.   Current Medications (verified): 1)  Trazodone Hcl 50 Mg Tabs (Trazodone Hcl) .... 3 Tabs Daily 2)  Relpax 20 Mg  Tabs (Eletriptan Hydrobromide) .... Take As Directed For Headache 3)  Acetaminophen-Codeine #3 300-30 Mg Tabs (Acetaminophen-Codeine) .... As Needed Q 6-8 Hrs 4)  Vitamin D 1000 Unit Tabs (Cholecalciferol) .Marland Kitchen.. 1 By Mouth Weekly  Allergies: 1)  ! Morphine 2)  ! * Actonel 3)  ! * Boniva 4)  ! * Albuterol  Past History:  Past Medical History: 1. Low back pain, chronic: Bulging  Disc & Spinal Stenosis , Dr Venetia Maxon  2. Fibromyalgia 3. Osteopenia:T score -2.4 @ L femoral neck, -0.9 @ spine 02/09/2010; bisphosphonate & calcium intolerance(Note: Tamoxifen started 02/2010, Dr Darnelle Catalan) 4. Breast cancer: completed radiation 2/11.  5. Vitamin D deficiency (vit D 18 in 01/2009) 6. Hyperlipidemia: NMR Lipoprofile  : LDL 157(1618/664),TG 95, HDL 75. LDL goal= < 130; FH CAD. Diet-controlled.  Family History: Reviewed history from 03/22/2010 and no changes required. Family History MI : F,P aunt,PGF,P  uncle,PGM Father: d 41, initial  MI @ 81 Mother: HTN, duodenal  ulcers Siblings:  bro HTN  Social History: Reviewed history from 03/22/2010 and no changes required. Married Alcohol use-no Never Smoked Intermittent  heart healthy diet Regular exercise-yes: walks 5X/week  Review of Systems       All systems reviewed and negative except as per HPI.   Vital Signs:  Patient profile:   65 year old female Height:      64 inches Weight:      129 pounds BMI:     22.22 Pulse rate:   70 / minute Resp:     16 per minute BP sitting:   114 / 76  (left arm)  Vitals Entered By: Kem Parkinson (November 08, 2010 12:00 PM)  Physical Exam  General:  Well developed, well nourished, in no acute distress. Head:  normocephalic and atraumatic Nose:  no deformity, discharge, inflammation, or lesions Mouth:  Teeth, gums and palate normal. Oral mucosa normal. Neck:  Neck supple, no JVD. No masses, thyromegaly or abnormal cervical nodes. Lungs:  Clear bilaterally to auscultation and percussion. Heart:  Non-displaced PMI, chest non-tender; regular rate and rhythm, S1, S2 without murmurs, rubs or gallops. Carotid upstroke normal, no bruit.  Pedals normal pulses. No edema, no varicosities. Abdomen:  Bowel sounds positive; abdomen soft and non-tender without masses, organomegaly, or hernias noted. No hepatosplenomegaly. Msk:  Back normal, normal gait. Muscle strength and tone normal. Extremities:  No clubbing or cyanosis. Neurologic:  Alert and oriented x 3. Skin:  Intact without lesions or rashes. Psych:  Normal affect.   Impression & Recommendations:  Problem # 1:  CHEST PAIN (ICD-786.50) Patient had multiple episodes of atypical chest pain during 1 week in 9/11.  At the time, ECG showed no significant changes and cardiac enzymes/D dimer were normal.  She has had no symptoms since that time.  She has never had exertional symptoms and tends to be at least moderately active most days.  I explained  to her that though her symptoms were atypical, she does have hyperlipidemia and a family history of CAD, so a stress echocardiogram would be a reasonable step to risk stratify her.  She does not want to do this at this time but promised me that if the symptoms recur, she will contact us for further evaluation.  She will take ASA 81 mg daily.   Problem # 2:  HYPERLIPIDEMIA (ICD-272.2) With LDL close to 160 when last checked, a statin would be very reasonable.  She does not want to do this at this time.    Patient Instructions: 1)  Your physician has recommended you make the following change in your medication:  2)  Take Aspirin 81mg  daily--this should be buffered or coated. 3)  Your physician recommends that you schedule a follow-up appointment as needed with Dr Shirlee Latch.

## 2011-01-18 NOTE — Letter (Signed)
[facility]   Imported By: Lanelle Bal 04/05/2010 10:00:42  _____________________________________________________________________  External Attachment:    Type:   Image     Comment:   External Document

## 2011-01-18 NOTE — Miscellaneous (Signed)
Summary: BONE DENSITY  Clinical Lists Changes  Orders: Added new Test order of T-Bone Densitometry (77080) - Signed Added new Test order of T-Lumbar Vertebral Assessment (77082) - Signed 

## 2011-01-18 NOTE — Letter (Signed)
[facility]   Imported By: Lanelle Bal 07/05/2010 13:16:30  _____________________________________________________________________  External Attachment:    Type:   Image     Comment:   External Document

## 2011-01-18 NOTE — Progress Notes (Signed)
Summary: [facility]  Phone Note Call from Patient Call back at Home Phone [phone]   Caller: Patient Summary of Call: patient wants referral to [facility] for back pain -she didnt have the name of the dr or practice - she want to discuss with dr hopper Initial call taken by: Okey Regal [place],  July 05, 2010 2:43 PM  Follow-up for Phone Call        Pt states that Dr.Hopper recommended that she see neuro. She would like to know if you know anything about these doctors and if [facility] would be a good place for her to go to. Dr.Sweasey, Dr. Hubbard [facility] CMA  July 05, 2010 4:54 PM   Additional Follow-up for Phone Call Additional follow up Details #1::        I reviewed their credentials, either appropriate. I recommend she either see Dr Venetia Maxon or see me  & bring past medical records concerning her Spinal Stenosis. She may need updated imaging depending on active symptoms & exam findings ,.[facility] will want an updated note.The Neurosurgeons usually will not see someone w/o such a preliminary assessment. Additional Follow-up by: Marga Melnick MD,  July 06, 2010 8:01 AM    Additional Follow-up for Phone Call Additional follow up Details #2::    Spoke with patient and she ok'd all information provided by Dr.Hopper and said she was in the middle of something and she will call back if she decideds to see Dr.Hopper, otherwise patient to f/u with Dr.Stern and the process for her being referred to [facility] will flow from there./Chrae [facility] CMA  July 06, 2010 10:51 AM

## 2011-01-18 NOTE — Progress Notes (Signed)
Summary: Triage: Fibromyalgia  Phone Note Call from Patient Call back at Home Phone [phone]   Caller: Patient Summary of Call: Patient with really bad episodes of Fibromyalgia x 3 weeks and would like to know if she could get a rx for 1.) Promethazine 25mg  tab  2.) Meperidine (Sud for demerol) 50mg  tab this was originally rx'ed by a doctor that done Foot Surgery  I asked patient has she ever seen anyone for her fibromyalgia and she indicated she seen Dr.Deveshwar but its been years ago  Dr.Hopper please advise, last OV 05/07/10 (rash), 03/22/10 (CPX)    Chrae Malloy  June 10, 2010 4:40 PM   Follow-up for Phone Call        discussed; symptoms probably from Spinal Stenosis for which seen in past by Dr Venetia Maxon .She'll call his office for appt. New Rx called in Follow-up by: Marga Melnick MD,  June 10, 2010 6:36 PM    New/Updated Medications: CYCLOBENZAPRINE HCL 10 MG TABS (CYCLOBENZAPRINE HCL) 1 two times a day & at bedtime as needed  for back pain TRAMADOL HCL 50 MG TABS (TRAMADOL HCL) 1 every 6 hrs as needed for back apin Prescriptions: TRAMADOL HCL 50 MG TABS (TRAMADOL HCL) 1 every 6 hrs as needed for back apin  #30 x 0   Entered and Authorized by:   Marga Melnick MD   Signed by:   Marga Melnick MD on 06/10/2010   Method used:   Faxed to ...       [facility] (retail)       [address]       [place], [place]  [address]       Ph: [phone] or [phone]       Fax: [phone]   RxID:   [id number] CYCLOBENZAPRINE HCL 10 MG TABS (CYCLOBENZAPRINE HCL) 1 two times a day & at bedtime as needed  for back pain  #20 x 0   Entered and Authorized by:   Marga Melnick MD   Signed by:   Marga Melnick MD on 06/10/2010   Method used:   Faxed to ...       [facility] (retail)       [address]       [place], [place]  [address]       Ph: [phone] or [phone]       Fax: [phone]   RxID:    [id number]

## 2011-01-18 NOTE — Progress Notes (Signed)
Summary: chest tightness  Phone Note Call from Patient Call back at Home Phone [phone]   Caller: Patient Summary of Call: Spoke w/ patient says has had 3-4 episodes of chest tightness in the past week had been intermittent no SOB, nausea, or numbness or tingling in arms. Offered appt today w/ another physican patient refused only wanted to see Hop, also offered early appt tomorrow at 8:30am and patient request something later stated "I don't think it's urgent" ok w/ waiting until tomorrow. Initial call taken by: Doristine Devoid CMA,  September 09, 2010 11:26 AM

## 2011-01-18 NOTE — Letter (Signed)
Summary: Cyndia Bent MD  Cyndia Bent MD   Imported By: Lennie Odor 07/15/2010 12:08:52  _____________________________________________________________________  External Attachment:    Type:   Image     Comment:   External Document

## 2011-01-18 NOTE — Assessment & Plan Note (Signed)
Summary: POSSIBLE UTI/KB   Vital Signs:  Patient profile:   65 year old female Weight:      127.8 pounds BMI:     21.52 Temp:     97.9 degrees F oral Pulse rate:   60 / minute Resp:     17 per minute BP sitting:   112 / 68  (left arm) Cuff size:   regular  Vitals Entered By: Shonna Chock CMA (October 05, 2010 3:08 PM) CC: ? UTI: Frequency, discomfort, and lower back pain x 3 days.  Overall patient feels bad, Dysuria   CC:  ? UTI: Frequency, discomfort, and lower back pain x 3 days.  Overall patient feels bad, and Dysuria.  History of Present Illness: Dysuria      This is a 65 year old woman who presents with Dysuria X 3 days . She leaves for [facility] [place] tomorrow.  The patient reports burning with urination, urinary frequency, urgency, and vaginal itching, but denies hematuria and vaginal discharge.  Associated symptoms include bilateral  flank pain and pelvic pressure.  The patient denies the following associated symptoms: nausea, vomiting, fever, shaking chills, and abdominal pain.  History is significant for no urinary tract problems in recent past .  Rx:  none  Current Medications (verified): 1)  Trazodone Hcl 50 Mg Tabs (Trazodone Hcl) .... 3 Tabs Daily 2)  Tylenol 2 Tabs Qhs 3)  Relpax 20 Mg  Tabs (Eletriptan Hydrobromide) .... Take As Directed For Headache 4)  Acetaminophen-Codeine #3 300-30 Mg Tabs (Acetaminophen-Codeine) .... As Needed Q 6-8 Hrs 5)  Vitamin D3 50000 Unit Caps (Cholecalciferol) .Marland Kitchen.. 1 Capsule Once Weekly 6)  Tamoxifen Citrate 10 Mg Tabs (Tamoxifen Citrate) .Marland Kitchen.. 1 By Mouth Once Daily 7)  Mometasone Furoate 0.1 % Crea (Mometasone Furoate) .... Apply Two Times A Day 8)  Cyclobenzaprine Hcl 10 Mg Tabs (Cyclobenzaprine Hcl) .Marland Kitchen.. 1 Two Times A Day & At Bedtime As Needed  For Back Pain 9)  Tramadol Hcl 50 Mg Tabs (Tramadol Hcl) .Marland Kitchen.. 1 Every 6 Hrs As Needed For Back Apin 10)  Ranitidine Hcl 150 Mg Tabs (Ranitidine Hcl) .Marland Kitchen.. 1 Two Times A Day Pre  Meals  Allergies: 1)  ! Morphine 2)  ! * Actonel 3)  ! * Boniva 4)  ! * Albuterol  Physical Exam  General:  well-nourished,in no acute distress; alert,appropriate and cooperative throughout examination Abdomen:  Bowel sounds positive,abdomen soft but slightly tender suprapubic area without masses, organomegaly or hernias noted. Msk:  She  lay down  & sat up w/o help. Slight flank tenderness to percussion Extremities:  No clubbing, cyanosis, edema. Neurologic:  alert & oriented X3, strength normal in all extremities, and DTRs symmetrical and normal.   Skin:  Intact without suspicious lesions or rashes Cervical Nodes:  No lymphadenopathy noted Axillary Nodes:  No palpable lymphadenopathy   Impression & Recommendations:  Problem # 1:  DYSURIA (ICD-788.1) with back pain; clinically no LBS . UA is normal. Her updated medication list for this problem includes:    Smz-tmp Ds 800-160 Mg Tabs (Sulfamethoxazole-trimethoprim) .Marland Kitchen... 1 two times a day with 8 oz of water  Orders: T-Culture, Urine ([id number])  Complete Medication List: 1)  Trazodone Hcl 50 Mg Tabs (Trazodone hcl) .... 3 tabs daily 2)  Tylenol 2 Tabs Qhs  3)  Relpax 20 Mg Tabs (Eletriptan hydrobromide) .... Take as directed for headache 4)  Acetaminophen-codeine #3 300-30 Mg Tabs (Acetaminophen-codeine) .... As needed q 6-8 hrs 5)  Vitamin D 1000 Unit Tabs (  Cholecalciferol) .Marland Kitchen.. 1 by mouth weekly 6)  Tramadol Hcl 50 Mg Tabs (Tramadol hcl) .Marland Kitchen.. 1 every 6 hrs as needed for back apin 7)  Smz-tmp Ds 800-160 Mg Tabs (Sulfamethoxazole-trimethoprim) .Marland Kitchen.. 1 two times a day with 8 oz of water  Other Orders: UA Dipstick w/o Micro (manual) (91478)  Patient Instructions: 1)  Drink as much fluid as you can tolerate for the next few days. Prescriptions: SMZ-TMP DS 800-160 MG TABS (SULFAMETHOXAZOLE-TRIMETHOPRIM) 1 two times a day with 8 oz of water  #14 x 0   Entered and Authorized by:   Marga Melnick MD   Signed by:   Marga Melnick MD on 10/05/2010   Method used:   Print then Give to Patient   RxID:   [id number]    Orders Added: 1)  UA Dipstick w/o Micro (manual) [81002] 2)  Est. Patient Level III [62952] 3)  T-Culture, Urine [[id number]]    Laboratory Results   Urine Tests    Routine Urinalysis   Color: lt. yellow Appearance: Clear Glucose: negative   (Normal Range: Negative) Bilirubin: negative   (Normal Range: Negative) Ketone: negative   (Normal Range: Negative) Spec. Gravity: 1.010   (Normal Range: 1.003-1.035) Blood: negative   (Normal Range: Negative) pH: 5.0   (Normal Range: 5.0-8.0) Protein: negative   (Normal Range: Negative) Urobilinogen: 0.2   (Normal Range: 0-1) Nitrite: negative   (Normal Range: Negative) Leukocyte Esterace: negative   (Normal Range: Negative)

## 2011-01-20 NOTE — Letter (Signed)
Summary: [facility]  [facility]   Imported By: Sherian Rein 12/08/2010 08:02:34  _____________________________________________________________________  External Attachment:    Type:   Image     Comment:   External Document

## 2011-01-20 NOTE — Progress Notes (Signed)
Summary: Chest pain--out of town  Phone Note Call from Patient   Summary of Call: Patient left message on triage that she was havig chest pain and is out of town. She requested that we call her with her cardiologist name.   I advised the patient that of her MD name, and she notes that she is much better now, but just wanted the info in case she decided to go to the ER. Initial call taken by: Lucious Groves CMA,  December 24, 2010 11:35 AM

## 2011-03-21 ENCOUNTER — Other Ambulatory Visit: Payer: Self-pay | Admitting: Dermatology

## 2011-03-23 LAB — POCT HEMOGLOBIN-HEMACUE: Hemoglobin: 13.8 g/dL (ref 12.0–15.0)

## 2011-03-24 LAB — POCT HEMOGLOBIN-HEMACUE: Hemoglobin: 10.6 g/dL — ABNORMAL LOW (ref 12.0–15.0)

## 2011-03-29 ENCOUNTER — Telehealth: Payer: Self-pay | Admitting: Internal Medicine

## 2011-03-29 NOTE — Telephone Encounter (Signed)
Entered lab orders for 6/11 labs

## 2011-03-29 NOTE — Telephone Encounter (Signed)
Patient has appt with Dr Alwyn Ren on 6/18 and fasting labs for 6/11---what orders and codes do you want to use???     thanks

## 2011-03-29 NOTE — Telephone Encounter (Signed)
Lipid, Hep,BMP, CBCD,TSH, Vit D, stool cards: 272.4/995.20/733.0

## 2011-04-05 ENCOUNTER — Other Ambulatory Visit: Payer: Self-pay

## 2011-04-05 MED ORDER — ELETRIPTAN HYDROBROMIDE 20 MG PO TABS: 20.0000 mg | ORAL_TABLET | ORAL | Status: DC | PRN

## 2011-05-03 NOTE — Procedures (Signed)
DUPLEX DEEP VENOUS EXAM - LOWER EXTREMITY   INDICATION:  Right calf pain status post foot surgery.   HISTORY:  Edema:  No.  Trauma/Surgery:  Yes.  Pain:  Yes.  PE:  No.  Previous DVT:  None.  Anticoagulants:  None.  Other:   CO-REFERRING PHYSICIAN:  Mark A. Perini, M.D.   DUPLEX EXAM:                CFV   SFV   PopV  PTV    GSV                R  L  R  L  R  L  R   L  R  L  Thrombosis    0  0  0     0     0      0  Spontaneous   +  +  +     +     +      +  Phasic        +  +  +     +     +      +  Augmentation  +  +  +     +     +      +  Compressible  +  +  +     +     +      +  Competent     +  +  +     +     +      +   Legend:  + - yes  o - no  p - partial  D - decreased   IMPRESSION:  No evidence of DVT noted in the right leg.    _____________________________  Quita Skye Hart [place], M.D.   MG/MEDQ  D:  11/21/2008  T:  11/21/2008  Job:  [id number]   cc:   Loraine Leriche A. Perini, M.D.

## 2011-05-06 NOTE — Assessment & Plan Note (Signed)
[facility]                        [place] [facility] OFFICE NOTE   NAME:Santana, Sara BRIONES                   MRN:          [id number]  DATE:03/14/2007                            DOB:          January 28, 1946    [name] was seen March 14, 2007 with protracted bronchitis.  She has been sick since late February.  Her symptoms began before her  airflight to [place] and she was sick the entire time she was there.  She has had fever, nasal congestion and signs of rhinosinusitis.  At  this time her major symptoms are cough with yellow sputum.  She has not  had fever since March 11, 3007; on that date her temperature was 100 to  101.   At this time she denies nasal obstruction, facial pain, anosmia or  purulence.  She questions whether she may have halitosis.  She has no  dental pain or earache.  She has not treated the symptoms with anything  than over the counter medicines.   Her son does have asthma; she has no past history of reactive airways  disease. She has never smoked.   Her weight is stable at 126, temperature was 97.7, respiratory rate 15  and pulse 52.  Blood pressure is 100/78.  She has full extraocular motion.  She has no icterus or jaundice.  The  otolaryngologic exam was unremarkable.  Specifically tympanic membranes  were clear and nares were patent.  There was no erythema to the  oropharynx.  CHEST:  Revealed mild bronchovesicular changes in the left lower lobe.  She had a recurrent incessant barking cough.  She had no lymphadenopathy  in the head, neck or axilla.   An additional problem was a questionable cyst over the upper back.  Clinically this appears to be an epidermal inclusion cyst; is less  likely to be a lipoma.  This should be removed if associated with  recurrent infection or cellulitis.   For over a month she describes pain across the dorsum of her right foot  with ambulation.   Below the right lateral  malleolus there is a cystic lesion that  transilluminates.  It does change in size though with range of motion  maneuvers of the ankle.  There is no pain to compression of the tarsal  bones.  The skin is clear with no signs of cellulitis or erythema.  With  flexion of the ankle and compression of the calf she has pain in the  calf.  Clinically there is no evidence of deep venous thrombosis.   This is felt to be a ganglion or a cyst on the ligamentous support  structures of ankle.  Referral to an orthopedist will be pursued.  Hopefully local injection will suffice.   Because of the prolonged bronchitis she was placed on Avalox, given  three samples with a 5 day prescription as a followup.  She was also  given Tussionex because of the recalcitrant cough which places her at  risk for rib injury in view of her osteopenia.     Titus Dubin. Alwyn Ren, MD,FACP,FCCP  Electronically Signed    [facility]  DD: 03/14/2007  DT: 03/14/2007  Job #: [id number]

## 2011-05-18 ENCOUNTER — Encounter (INDEPENDENT_AMBULATORY_CARE_PROVIDER_SITE_OTHER): Payer: Self-pay | Admitting: Surgery

## 2011-05-30 ENCOUNTER — Other Ambulatory Visit (INDEPENDENT_AMBULATORY_CARE_PROVIDER_SITE_OTHER): Payer: BC Managed Care – PPO

## 2011-05-30 ENCOUNTER — Other Ambulatory Visit: Payer: Self-pay | Admitting: Internal Medicine

## 2011-05-30 DIAGNOSIS — E785 Hyperlipidemia, unspecified: Secondary | ICD-10-CM

## 2011-05-30 DIAGNOSIS — T887XXA Unspecified adverse effect of drug or medicament, initial encounter: Secondary | ICD-10-CM

## 2011-05-30 LAB — CBC WITH DIFFERENTIAL/PLATELET
Basophils Absolute: 0 10*3/uL (ref 0.0–0.1)
Basophils Relative: 0.9 % (ref 0.0–3.0)
Eosinophils Absolute: 0.1 10*3/uL (ref 0.0–0.7)
Lymphocytes Relative: 33.7 % (ref 12.0–46.0)
MCHC: 35 g/dL (ref 30.0–36.0)
Neutrophils Relative %: 55.4 % (ref 43.0–77.0)
Platelets: 240 10*3/uL (ref 150.0–400.0)
RBC: 3.98 Mil/uL (ref 3.87–5.11)

## 2011-05-30 LAB — BASIC METABOLIC PANEL
CO2: 28 mEq/L (ref 19–32)
Calcium: 9.2 mg/dL (ref 8.4–10.5)
Creatinine, Ser: 1.1 mg/dL (ref 0.4–1.2)

## 2011-05-30 LAB — HEPATIC FUNCTION PANEL
AST: 20 U/L (ref 0–37)
Albumin: 4.6 g/dL (ref 3.5–5.2)
Alkaline Phosphatase: 67 U/L (ref 39–117)
Total Protein: 7.3 g/dL (ref 6.0–8.3)

## 2011-05-30 LAB — LIPID PANEL
Cholesterol: 253 mg/dL — ABNORMAL HIGH (ref 0–200)
Total CHOL/HDL Ratio: 4

## 2011-05-30 LAB — TSH: TSH: 2.27 u[IU]/mL (ref 0.35–5.50)

## 2011-05-30 NOTE — Progress Notes (Signed)
Labs only

## 2011-06-04 ENCOUNTER — Encounter: Payer: Self-pay | Admitting: Internal Medicine

## 2011-06-06 ENCOUNTER — Ambulatory Visit (INDEPENDENT_AMBULATORY_CARE_PROVIDER_SITE_OTHER): Payer: BC Managed Care – PPO | Admitting: Internal Medicine

## 2011-06-06 ENCOUNTER — Encounter: Payer: Self-pay | Admitting: Internal Medicine

## 2011-06-06 DIAGNOSIS — M810 Age-related osteoporosis without current pathological fracture: Secondary | ICD-10-CM | POA: Insufficient documentation

## 2011-06-06 DIAGNOSIS — G43909 Migraine, unspecified, not intractable, without status migrainosus: Secondary | ICD-10-CM

## 2011-06-06 DIAGNOSIS — E559 Vitamin D deficiency, unspecified: Secondary | ICD-10-CM

## 2011-06-06 DIAGNOSIS — G479 Sleep disorder, unspecified: Secondary | ICD-10-CM

## 2011-06-06 DIAGNOSIS — E782 Mixed hyperlipidemia: Secondary | ICD-10-CM

## 2011-06-06 MED ORDER — TRAZODONE HCL 50 MG PO TABS: 50.0000 mg | ORAL_TABLET | Freq: Every day | ORAL | Status: DC

## 2011-06-06 MED ORDER — ELETRIPTAN HYDROBROMIDE 20 MG PO TABS: 20.0000 mg | ORAL_TABLET | ORAL | Status: DC | PRN

## 2011-06-06 NOTE — Patient Instructions (Addendum)
Consider Colonoscopy as discussed. Preventive Health Care: Exercise  30-45  minutes a day, 3-4 days a week. Walking is especially valuable in preventing Osteoporosis. Eat a low-fat diet with lots of fruits and vegetables, up to 7-9 servings per day. Seatbelts can save your life. Wear them always. BMD every 25 months. Vitamin D level > 40. Consider low dose statin (Livalo 2 mg or Crestor 5 mg  ONCE  weekly ) with  fasting Labs :Lipids, hepatic panel, CK, A1c( 272.4, V17.3, 995.20, 790.29). Call if meds needed for cough

## 2011-06-06 NOTE — Assessment & Plan Note (Signed)
Vit D goal => 40 (40-60)

## 2011-06-06 NOTE — Progress Notes (Signed)
Subjective:    Patient ID: [name], female    DOB: [birth year], 65 y.o.   MRN: [id number]  HPI  Mrs [name]  is here for a physical;acute issues  Include cough.      Review of Systems Cough:Onset:6 weeks ago Extrinsic symptoms:itchy eyes, sneezing:no  Infectious symptoms :fever, purulent secretions :not now Chest symptoms: pain, sputum production, hemoptysis,dyspnea; no; minor wheezing GI symptoms: Dyspepsia, reflux:no Occupational/environmental exposures:no Smoking:in college only ACE inhibitor:no Treatment/efficacy:no treatment. The major and minor symptoms of rhinosinusitis were reviewed. She denies nasal congestion/obstruction; nasal purulence; facial pain; anosmia;headache; halitosis; earache and dental pain.She has intermittent fatigue; she previously saw Dr [name] but was intolerant to recommended therapies.  Past medical history/family history pulmonary disease: her son had asthma as child. Dr. Shirlee Latch , Cardoilogist, will follow as needed. She declined further evaluation with stress testing. No GI symptoms; no Colonoscopy to date; [facility] reviewed ."I'm  thinking about it" No GU symptoms     Objective:   Physical Exam Gen.: Healthy and well-nourished in appearance. Alert, appropriate and cooperative throughout exam. Head: Normocephalic without obvious abnormalities Eyes: No corneal or conjunctival inflammation noted. Pupils equal round reactive to light and accommodation. Fundal exam is benign without hemorrhages, exudate, papilledema. Extraocular motion intact. Vision slightly decreased OS  Ears: External  ear exam reveals no significant lesions or deformities. Canals clear .TMs normal. Hearing is grossly normal bilaterally. Nose: External nasal exam reveals no deformity or inflammation. Nasal mucosa are pink and moist. No lesions or exudates noted. Septum   normal  Mouth: Oral mucosa and oropharynx reveal no lesions or exudates. Teeth in good repair. Neck: No  deformities, masses, or tenderness noted. Range of motion &. Thyroid  normal. Lungs: Normal respiratory effort; chest expands symmetrically. Lungs are clear to auscultation without rales, wheezes, or increased work of breathing. Heart: Normal rate and rhythm. Normal S1 and S2. No gallop, click, or rub. S4 w/o  murmur. Abdomen: Bowel sounds normal; abdomen soft  w/o masses, organomegaly or hernias noted. Genitalia: Gyn to be established                                                                                     Musculoskeletal/extremities: No deformity or scoliosis noted of  the thoracic or lumbar spine. No clubbing, cyanosis, edema, or deformity noted. Range of motion  normal .Tone & strength  normal.Nail health  Good. Minor crepitus L knee Vascular: Carotid, radial artery, dorsalis pedis and dorsalis posterior tibial pulses are full and equal. No bruits present. Neurologic: Alert and oriented x3. Deep tendon reflexes symmetrical and normal.          Skin: Intact without suspicious lesions or rashes. Lymph: No cervical, axillary, or inguinal lymphadenopathy present. Psych: Mood and affect are normal. Normally interactive  Assessment & Plan:  #1 comprehensive physical exam; no acute findings #2 see Problem List with Assessments & Recommendations # 3 cough, improving w/o treatment Plan: see Orders

## 2011-06-27 ENCOUNTER — Telehealth: Payer: Self-pay | Admitting: Internal Medicine

## 2011-06-27 NOTE — Telephone Encounter (Signed)
Also says there is a 150mg  pill---she would rather take one 150mg  pill instead of three 50mg  pills  (unless the cost is the factor)--she uses [facility] on [address], [place]

## 2011-06-27 NOTE — Telephone Encounter (Signed)
Patient called to check status of this prescription---she is leaving town Wed morning, so needs to have the correct prescription by Tuesday evening

## 2011-06-27 NOTE — Telephone Encounter (Signed)
error 

## 2011-06-28 MED ORDER — TRAZODONE HCL 150 MG PO TABS: 150.0000 mg | ORAL_TABLET | Freq: Every day | ORAL | Status: DC

## 2011-06-28 NOTE — Telephone Encounter (Signed)
150 mg qhs # 90, R x3

## 2011-06-28 NOTE — Telephone Encounter (Signed)
Dr.Hopper please advise, this patient is stating that shr would like Trazodone 150mg  1 daily or Trazodone 50mg  (3 by mouth daily). Patient's current med list states 1 by mouth at bedtime only.

## 2011-06-28 NOTE — Telephone Encounter (Signed)
RX sent to pharmacy  

## 2011-08-29 ENCOUNTER — Other Ambulatory Visit: Payer: Self-pay | Admitting: Internal Medicine

## 2011-08-29 DIAGNOSIS — Z853 Personal history of malignant neoplasm of breast: Secondary | ICD-10-CM

## 2011-08-29 DIAGNOSIS — Z9889 Other specified postprocedural states: Secondary | ICD-10-CM

## 2011-09-02 ENCOUNTER — Encounter (INDEPENDENT_AMBULATORY_CARE_PROVIDER_SITE_OTHER): Payer: Self-pay | Admitting: General Surgery

## 2011-09-06 ENCOUNTER — Encounter (INDEPENDENT_AMBULATORY_CARE_PROVIDER_SITE_OTHER): Payer: Self-pay | Admitting: Surgery

## 2011-09-06 ENCOUNTER — Ambulatory Visit (INDEPENDENT_AMBULATORY_CARE_PROVIDER_SITE_OTHER): Payer: BC Managed Care – PPO | Admitting: Surgery

## 2011-09-06 VITALS — BP 116/78 | HR 80 | Temp 98.0°F | Resp 16 | Ht 65.5 in | Wt 129.2 lb

## 2011-09-06 DIAGNOSIS — N62 Hypertrophy of breast: Secondary | ICD-10-CM

## 2011-09-06 DIAGNOSIS — Z853 Personal history of malignant neoplasm of breast: Secondary | ICD-10-CM

## 2011-09-06 DIAGNOSIS — IMO0002 Reserved for concepts with insufficient information to code with codable children: Secondary | ICD-10-CM

## 2011-09-06 NOTE — Patient Instructions (Signed)
We will try to arrange another mammogram since it had some changes in the right breast. I think he should also have an MRI as youroriginal cancer with difficult to diagnose. I don't think he increase in the size of her right breast is due to any recurrent cancer, but I think we need to be certain of that.

## 2011-09-06 NOTE — Progress Notes (Signed)
NAME: [name]       DOB: [birth year]           DATE: 09/06/2011       MRN: [id number]   [name] is a 65 y.o.Marland Kitchenfemale who presents for routine followup of her Central right breast cancer diagnosed in 2100 and treated with lumpectomy and radiation therapy. SheNotes that the right breast seems to be getting larger over the last 3-4 months. Is not having any pain. The patient lateral to her breast also seems to be more prominent. She is very concerned about this although says that she doesn't think she has a recurrent cancer. She has noticed no specific mass or lump in either breast. PFSH: She has had no significant changes since the last visit here.  ROS: There have been no significant changes since the last visit here  EXAM: General: The patient is alert, oriented, generally healty appearing, NAD. Mood and affect are normal.  Breasts:  The right breast is status post central mastectomy with loss of the nipple areolar complex. It was a little irregular, but there is not a dominant mass. It seems to be about the same size as the left breast, despite the prior partial mastectomy. There is a small red area centrally located about 1.5 cm in diameter. I think this is related to radiation. No suspicious areas in the left breast.  Lymphatics: She has no axillary or supraclavicular adenopathy on either side.  Extremities: Full ROM of the surgical side with no lymphedema noted.  Data Reviewed: I have looked at last year's mammogram as well as the notes from the oncology office  Impression: There is no obvious recurrent cancer. However I'm concerned about the increase in size in her right breast.  Plan: We will try to arrange to have her mammogram done this month instead of waiting until the middle of next month because of the change in the right breast. We also be recommendations that she have an MRI of the breast since her original cancer was a difficult to diagnose. It also been  two years since the original diagnosis and she has not had an MRI since then. Will continue to follow up on an annual basis here.

## 2011-09-14 ENCOUNTER — Ambulatory Visit
Admission: RE | Admit: 2011-09-14 | Discharge: 2011-09-14 | Disposition: A | Discharge status: Home / Self Care | Payer: BC Managed Care – PPO | Source: Ambulatory Visit | Attending: Surgery | Admitting: Surgery

## 2011-09-14 DIAGNOSIS — Z853 Personal history of malignant neoplasm of breast: Secondary | ICD-10-CM

## 2011-09-14 DIAGNOSIS — IMO0002 Reserved for concepts with insufficient information to code with codable children: Secondary | ICD-10-CM

## 2011-09-15 ENCOUNTER — Ambulatory Visit
Admission: RE | Admit: 2011-09-15 | Discharge: 2011-09-15 | Disposition: A | Discharge status: Home / Self Care | Payer: No Typology Code available for payment source | Source: Ambulatory Visit | Attending: Surgery | Admitting: Surgery

## 2011-09-15 DIAGNOSIS — Z853 Personal history of malignant neoplasm of breast: Secondary | ICD-10-CM

## 2011-09-15 MED ORDER — GADOBENATE DIMEGLUMINE 529 MG/ML IV SOLN
10.0000 mL | Freq: Once | INTRAVENOUS | Status: AC | PRN
  Administered 2011-09-15: 10 mL via INTRAVENOUS

## 2011-09-30 ENCOUNTER — Other Ambulatory Visit: Payer: Self-pay | Admitting: Internal Medicine

## 2011-09-30 MED ORDER — TRAZODONE HCL 50 MG PO TABS: 50.0000 mg | ORAL_TABLET | Freq: Three times a day (TID) | ORAL | Status: DC

## 2011-09-30 NOTE — Telephone Encounter (Signed)
Dr.Hopper please advise 

## 2011-09-30 NOTE — Telephone Encounter (Signed)
OK 

## 2011-10-04 NOTE — Telephone Encounter (Signed)
Addended by: Edgardo Roys on: 10/04/2011 05:20 PM   Modules accepted: Orders

## 2011-10-04 NOTE — Telephone Encounter (Signed)
Changed rx from 1 by mouth TID to 3 by mouth QHS

## 2011-10-06 LAB — CULTURE, BLOOD (ROUTINE X 2)

## 2011-10-06 LAB — I-STAT 8, (EC8 V) (CONVERTED LAB)
BUN: 13
Chloride: 110
Glucose, Bld: 80
HCT: 39
Hemoglobin: 13.3
Operator id: 146091
pCO2, Ven: 31.9 — ABNORMAL LOW

## 2011-10-06 LAB — DIFFERENTIAL
Eosinophils Absolute: 0.1
Lymphocytes Relative: 24
Lymphs Abs: 1.1
Monocytes Relative: 11
Neutro Abs: 2.9
Neutrophils Relative %: 63

## 2011-10-06 LAB — CBC
MCV: 89.4
Platelets: 218
RBC: 4.31
WBC: 4.6

## 2011-10-06 LAB — POCT I-STAT CREATININE
Creatinine, Ser: 1
Operator id: 146091

## 2011-10-11 ENCOUNTER — Other Ambulatory Visit: Payer: Self-pay | Admitting: Oncology

## 2011-10-11 ENCOUNTER — Encounter (HOSPITAL_BASED_OUTPATIENT_CLINIC_OR_DEPARTMENT_OTHER): Payer: BC Managed Care – PPO | Admitting: Oncology

## 2011-10-11 DIAGNOSIS — Z170 Estrogen receptor positive status [ER+]: Secondary | ICD-10-CM

## 2011-10-11 DIAGNOSIS — C50919 Malignant neoplasm of unspecified site of unspecified female breast: Secondary | ICD-10-CM

## 2011-10-11 LAB — COMPREHENSIVE METABOLIC PANEL
ALT: 14 U/L (ref 0–35)
AST: 15 U/L (ref 0–37)
Albumin: 4.2 g/dL (ref 3.5–5.2)
Calcium: 9.7 mg/dL (ref 8.4–10.5)
Chloride: 104 mEq/L (ref 96–112)
Potassium: 4.1 mEq/L (ref 3.5–5.3)
Sodium: 140 mEq/L (ref 135–145)
Total Protein: 7.2 g/dL (ref 6.0–8.3)

## 2011-10-11 LAB — CBC WITH DIFFERENTIAL/PLATELET
BASO%: 0.5 % (ref 0.0–2.0)
EOS%: 1.5 % (ref 0.0–7.0)
HGB: 12.8 g/dL (ref 11.6–15.9)
MCH: 31.6 pg (ref 25.1–34.0)
MCHC: 34.4 g/dL (ref 31.5–36.0)
RDW: 13.7 % (ref 11.2–14.5)
WBC: 4.6 10*3/uL (ref 3.9–10.3)
lymph#: 1.1 10*3/uL (ref 0.9–3.3)

## 2011-10-18 ENCOUNTER — Encounter (HOSPITAL_BASED_OUTPATIENT_CLINIC_OR_DEPARTMENT_OTHER): Payer: BC Managed Care – PPO | Admitting: Oncology

## 2011-10-18 DIAGNOSIS — N644 Mastodynia: Secondary | ICD-10-CM

## 2011-10-18 DIAGNOSIS — Z170 Estrogen receptor positive status [ER+]: Secondary | ICD-10-CM

## 2011-10-18 DIAGNOSIS — Z853 Personal history of malignant neoplasm of breast: Secondary | ICD-10-CM

## 2011-10-21 ENCOUNTER — Telehealth: Payer: Self-pay | Admitting: *Deleted

## 2012-05-08 ENCOUNTER — Ambulatory Visit (INDEPENDENT_AMBULATORY_CARE_PROVIDER_SITE_OTHER): Payer: BC Managed Care – PPO | Admitting: Internal Medicine

## 2012-05-08 VITALS — BP 112/76 | HR 75 | Temp 98.0°F | Wt 128.0 lb

## 2012-05-08 DIAGNOSIS — N390 Urinary tract infection, site not specified: Secondary | ICD-10-CM

## 2012-05-08 LAB — POCT URINALYSIS DIPSTICK
Ketones, UA: NEGATIVE
Protein, UA: NEGATIVE
Spec Grav, UA: 1.01
pH, UA: 6

## 2012-05-08 MED ORDER — SULFAMETHOXAZOLE-TRIMETHOPRIM 800-160 MG PO TABS: 1.0000 | ORAL_TABLET | Freq: Two times a day (BID) | ORAL | Status: AC

## 2012-05-08 NOTE — Progress Notes (Signed)
  Subjective:    Patient ID: [name], female    DOB: 12/18/1946, 66 y.o.   MRN: [id number]  HPI Acute visit Symptoms started 4 days ago with urinary frequency, urine looks cloudy. He has a history of previous UTIs,  last UTI was more than a year ago.  Past Medical History  Diagnosis Date  . Arthritis   . Bronchitis   . Pneumonia   . Fibromyalgia   . Osteopenia   . HX: breast cancer     completed radiation 01/2010  . Vitamin D deficiency     18, 01/2009  . Hyperlipemia   . Chronic low back pain     bulging disc & spinal stenosis  . Osteoporosis   . Cancer     rt side    Review of Systems No fever, chills? No nausea, vomiting. No flank pain but she does have mild abdominal discomfort in the lower aspect.    Objective:   Physical Exam General -- alert, well-developed. No apparent distress.  Lungs -- normal respiratory effort, no intercostal retractions, no accessory muscle use, and normal breath sounds.   Heart-- normal rate, regular rhythm, no murmur, and no gallop.   Abdomen-- not distended, soft, slightly tender in the lower abdomen bilaterally, no mass or rebound. Extremities-- no pretibial edema bilaterally  Psych-- Cognition and judgment appear intact. Alert and cooperative with normal attention span and concentration.  not anxious appearing and not depressed appearing.       Assessment & Plan:   Symptoms and urinalysis consistent with a UTI. Will start antibiotics pending the urine culture, she reports previously some problems with "a lot of medicines", the chart is reviewed, she was prescribed Bactrim in 2011, no apparent problems consequently will start with Bactrim, if she has any problems with this antibiotic she will call and will switch to another agent.

## 2012-05-08 NOTE — Patient Instructions (Signed)
Start the antibiotic, Bactrim Drink plenty of fluids Call if not better in few days, call if you have severe symptoms, fever, chills, nausea or vomiting.

## 2012-05-09 ENCOUNTER — Encounter: Payer: Self-pay | Admitting: Internal Medicine

## 2012-05-11 LAB — URINE CULTURE

## 2012-05-15 ENCOUNTER — Telehealth: Payer: Self-pay | Admitting: Internal Medicine

## 2012-05-15 DIAGNOSIS — Z0000 Encounter for general adult medical examination without abnormal findings: Secondary | ICD-10-CM

## 2012-05-15 DIAGNOSIS — E785 Hyperlipidemia, unspecified: Secondary | ICD-10-CM

## 2012-05-15 NOTE — Telephone Encounter (Signed)
Future orders placed for here

## 2012-05-15 NOTE — Telephone Encounter (Signed)
Pt called stating she would like to have her labs done prior to her CPE. She has called her insurance company to verify that the labs will be covered. What labs will she need?

## 2012-05-15 NOTE — Telephone Encounter (Signed)
Thank you :)

## 2012-06-01 ENCOUNTER — Other Ambulatory Visit (INDEPENDENT_AMBULATORY_CARE_PROVIDER_SITE_OTHER): Payer: BC Managed Care – PPO

## 2012-06-01 DIAGNOSIS — E785 Hyperlipidemia, unspecified: Secondary | ICD-10-CM

## 2012-06-01 DIAGNOSIS — Z0000 Encounter for general adult medical examination without abnormal findings: Secondary | ICD-10-CM

## 2012-06-01 LAB — LIPID PANEL
Cholesterol: 224 mg/dL — ABNORMAL HIGH (ref 0–200)
Total CHOL/HDL Ratio: 3
Triglycerides: 67 mg/dL (ref 0.0–149.0)

## 2012-06-01 LAB — CBC WITH DIFFERENTIAL/PLATELET
Basophils Relative: 1.1 % (ref 0.0–3.0)
Eosinophils Relative: 3.6 % (ref 0.0–5.0)
Lymphocytes Relative: 26.3 % (ref 12.0–46.0)
MCV: 93.7 fl (ref 78.0–100.0)
Monocytes Relative: 6.4 % (ref 3.0–12.0)
Neutrophils Relative %: 62.6 % (ref 43.0–77.0)
RBC: 4.07 Mil/uL (ref 3.87–5.11)
WBC: 3.6 10*3/uL — ABNORMAL LOW (ref 4.5–10.5)

## 2012-06-01 LAB — LDL CHOLESTEROL, DIRECT: Direct LDL: 145 mg/dL

## 2012-06-01 LAB — BASIC METABOLIC PANEL
CO2: 24 mEq/L (ref 19–32)
Calcium: 9.1 mg/dL (ref 8.4–10.5)
GFR: 64.25 mL/min (ref >60.00)
Sodium: 141 mEq/L (ref 135–145)

## 2012-06-01 LAB — HEPATIC FUNCTION PANEL
AST: 21 U/L (ref 0–37)
Albumin: 4.3 g/dL (ref 3.5–5.2)

## 2012-06-01 LAB — TSH: TSH: 1.79 u[IU]/mL (ref 0.35–5.50)

## 2012-06-01 NOTE — Progress Notes (Signed)
Labs only

## 2012-06-07 ENCOUNTER — Other Ambulatory Visit: Payer: Self-pay | Admitting: Internal Medicine

## 2012-06-07 NOTE — Telephone Encounter (Signed)
#  90 OK 

## 2012-06-07 NOTE — Telephone Encounter (Signed)
Dr.Hopper please advise if ok to give number 90

## 2012-06-08 ENCOUNTER — Ambulatory Visit (INDEPENDENT_AMBULATORY_CARE_PROVIDER_SITE_OTHER): Payer: BC Managed Care – PPO | Admitting: Internal Medicine

## 2012-06-08 ENCOUNTER — Other Ambulatory Visit: Payer: Self-pay | Admitting: Internal Medicine

## 2012-06-08 ENCOUNTER — Encounter: Payer: Self-pay | Admitting: Internal Medicine

## 2012-06-08 VITALS — BP 110/74 | HR 65 | Temp 98.6°F | Resp 12 | Ht 64.08 in | Wt 128.2 lb

## 2012-06-08 DIAGNOSIS — Z0000 Encounter for general adult medical examination without abnormal findings: Secondary | ICD-10-CM

## 2012-06-08 DIAGNOSIS — M48061 Spinal stenosis, lumbar region without neurogenic claudication: Secondary | ICD-10-CM

## 2012-06-08 MED ORDER — GABAPENTIN 100 MG PO CAPS: ORAL_CAPSULE | ORAL | Status: DC

## 2012-06-08 MED ORDER — TRAZODONE HCL 50 MG PO TABS: ORAL_TABLET | ORAL | Status: DC

## 2012-06-08 MED ORDER — ELETRIPTAN HYDROBROMIDE 20 MG PO TABS: 20.0000 mg | ORAL_TABLET | ORAL | Status: DC | PRN

## 2012-06-08 NOTE — Progress Notes (Signed)
Subjective:    Patient ID: [name], female    DOB: 1945-12-28, 66 y.o.   MRN: [id number]  HPI  [name]  is here for a physical;acute issues include back pain & fatigue.      Review of Systems She has constant lumbosacral area pain alternating from one side to the other. This is described as burning and intermittently radiating into the legs to heel. It is not associated with numbness, tingling, or weakness in the legs. She's not had incontinence of urine or stool. She also denies fever, chills, sweats, unexplained weight loss, hematuria, pyuria, or dysuria. She must lie supine for 10 hours a day to function. Spinal stenosis was diagnosed by Dr. Venetia Maxon, neurosurgeon. She was intolerant to pain medications prescribed by him.  She denies abdominal pain, melena, rectal bleeding. She has  not had a colonoscopy; she states that she " just did not wish" to pursue this. Standard of care was reviewed     Objective:   Physical Exam Gen.: Healthy and well-nourished in appearance. Alert, appropriate and cooperative throughout exam. Head: Normocephalic without obvious abnormalities  Eyes: No corneal or conjunctival inflammation noted. Pupils equal round reactive to light and accommodation. Fundal exam is benign without hemorrhages, exudate, papilledema. Extraocular motion intact. Vision grossly decreased in OS. Ears: External  ear exam reveals no significant lesions or deformities. Canals clear .TMs normal. Hearing is grossly normal bilaterally. Nose: External nasal exam reveals no deformity or inflammation. Nasal mucosa are pink and moist. No lesions or exudates noted.  Mouth: Oral mucosa and oropharynx reveal no lesions or exudates. Teeth in good repair. Neck: No deformities, masses, or tenderness noted. Range of motion good. Thyroid normal. Lungs: Normal respiratory effort; chest expands symmetrically. Lungs are clear to auscultation without rales, wheezes, or increased work of  breathing. Heart: Normal rate and rhythm. Normal S1 and S2. No gallop, click, or rub. S4 w/o  murmur. Abdomen: Bowel sounds normal; abdomen soft and nontender. No masses, organomegaly or hernias noted. Genitalia: Gyn F/U needed                                                                                 Musculoskeletal/extremities: No deformity or scoliosis noted of  the thoracic or lumbar spine. No clubbing, cyanosis, edema, or deformity noted. Range of motion  normal .Tone & strength  normal.Joints normal. Nail health  good. She is able to lie flat and sit up without help. Straight leg raising is negative. There is some crepitus of the left knee without associated effusion Vascular: Carotid, radial artery, dorsalis pedis and  posterior tibial pulses are full and equal. No bruits present. Neurologic: Alert and oriented x3. Deep tendon reflexes symmetrical and normal. Gait, including heel and toe walking, is normal.         Skin: Intact without suspicious lesions or rashes. Lymph: No cervical, axillary lymphadenopathy present. Psych: Mood and affect are normal. Normally interactive  Assessment & Plan:  #1 comprehensive physical exam; no acute findings #2 see Problem List with Assessments & Recommendations  #3 lumbosacral pain with radiculopathy in the context of spinal stenosis. A trial of gabapentin with titration discussed.  #4 lack of colonoscopic surveillance; the standard of care reviewed  #5 vitamin D level was 29; it was recommended she take 1000 international units of vitamin D 3 daily and recheck vitamin D level in 4-6 months.  Plan: see Orders

## 2012-06-08 NOTE — Telephone Encounter (Signed)
Patient called and stated these were to be re-ordered based on her visit today  Trazodone & relpax, please send to [facility] on [address]. Patient would like a call back once sent [phone]

## 2012-06-08 NOTE — Patient Instructions (Addendum)
Preventive Health Care: Eat a low-fat diet with lots of fruits and vegetables, up to 7-9 servings per day.  Health Care Power of Attorney & Living Will place you in charge of your health care  decisions. Verify these are  in place. As per the Standard of Care , screening Colonoscopy recommended @ 50 & every 5-10 years thereafter . More frequent monitor would be dictated by family history or findings @ Colonoscopy .  The normal goal for  Vitamin D is 40-60. Vitamin D, along with calcium( 600 mg twice a day) & weight bearing exercises ( @ least 30 minutes of walking @ least 3X/ week),  is essential for bone health. Vitamin D is the # 1 cause of muscle pain in women. There are injectable & parenteral  Therapies ( Prolia & Reclast) for osteoporosis which can be pursued if you desire. Your bone density should be monitored at least every 25 months. Assess response to the gabapentin one every 8 hours as needed. If it is partially beneficial, it can be increased up to a total of 3 pills every 8 hours as needed. This increase of 1 pill each dose  should take place over 72 hours at least.

## 2012-06-08 NOTE — Telephone Encounter (Signed)
RX's sent in, left message on VM informing patient.

## 2012-06-08 NOTE — Telephone Encounter (Signed)
Dr.Hopper please advise if ok to give 1.)  # 270 with additional refills on Trazodone  2.) How many Relpax to dispense, ? If ok to give additional refills

## 2012-06-08 NOTE — Telephone Encounter (Signed)
Relpax #9; trazodone  # [phone]

## 2012-06-29 ENCOUNTER — Encounter (INDEPENDENT_AMBULATORY_CARE_PROVIDER_SITE_OTHER): Payer: Self-pay | Admitting: Surgery

## 2012-07-04 ENCOUNTER — Ambulatory Visit (INDEPENDENT_AMBULATORY_CARE_PROVIDER_SITE_OTHER): Payer: BC Managed Care – PPO | Admitting: Surgery

## 2012-07-04 ENCOUNTER — Encounter (INDEPENDENT_AMBULATORY_CARE_PROVIDER_SITE_OTHER): Payer: Self-pay | Admitting: Surgery

## 2012-07-04 VITALS — BP 108/60 | HR 64 | Temp 96.5°F | Resp 12 | Ht 65.0 in | Wt 129.0 lb

## 2012-07-04 DIAGNOSIS — Z853 Personal history of malignant neoplasm of breast: Secondary | ICD-10-CM

## 2012-07-04 NOTE — Patient Instructions (Addendum)
I'll plan to see you again in about 6 months. Have your mammogram as scheduled this fall and have the radiology Department send me a copy please

## 2012-07-04 NOTE — Progress Notes (Signed)
Chief complaint: Discomfort in her right breast status post lumpectomy and radiation therapy  History of present illness: This patient underwent a partial central mastectomy on the right side in 2010. I saw her this fall when she was noticed that the right breast seemed to be getting larger than the left. She also felt uncomfortable in the breast but did not feel a definite mass. A mammogram and MRI at that point in time were negative. She comes in today because she is concerned that she still having some discomfort in the right breast.  Exam Vital signs:BP 108/60  Pulse 64  Temp 96.5 F (35.8 C) (Temporal)  Resp 12  Ht 5\' 5"  (1.651 m)  Wt 129 lb (58.514 kg)  BMI 21.47 kg/m2 General: Versus alert oriented healthy appearing slightly anxious. Breasts: Right breast is status post central mastectomy with a transverse incision. It is a little bit larger than the left. It is diffusely tender. There is some mild nodularity consistent with fibrocystic changes in the lower inner quadrant near the inframammary fold. The left breast is unremarkable.  Lymphatics: No axillary or supraclavicular adenopathy is noted.  Impression: Status post lumpectomy radiation for stage I right breast cancer centralWith no evidence of local recurrences  Plan: I'll see her back in 6 months. I think much of her discomfort is from her radiation surgery combination. It is not appear to be evidence of recurrence. She is due mammogram this fall as well.

## 2012-08-13 ENCOUNTER — Other Ambulatory Visit (INDEPENDENT_AMBULATORY_CARE_PROVIDER_SITE_OTHER): Payer: Self-pay | Admitting: Surgery

## 2012-08-13 DIAGNOSIS — Z853 Personal history of malignant neoplasm of breast: Secondary | ICD-10-CM

## 2012-09-24 ENCOUNTER — Ambulatory Visit
Admission: RE | Admit: 2012-09-24 | Discharge: 2012-09-24 | Disposition: A | Discharge status: Home / Self Care | Payer: Self-pay | Source: Ambulatory Visit | Attending: Surgery | Admitting: Surgery

## 2012-09-24 DIAGNOSIS — Z853 Personal history of malignant neoplasm of breast: Secondary | ICD-10-CM

## 2012-09-25 ENCOUNTER — Other Ambulatory Visit: Payer: Self-pay | Admitting: Internal Medicine

## 2012-09-25 NOTE — Telephone Encounter (Signed)
Dr.Hopper please advise 3 by mouth at bedtime, #90

## 2012-10-09 ENCOUNTER — Other Ambulatory Visit: Payer: Self-pay | Admitting: *Deleted

## 2012-10-09 ENCOUNTER — Other Ambulatory Visit (HOSPITAL_BASED_OUTPATIENT_CLINIC_OR_DEPARTMENT_OTHER): Payer: BC Managed Care – PPO | Admitting: Lab

## 2012-10-09 DIAGNOSIS — Z853 Personal history of malignant neoplasm of breast: Secondary | ICD-10-CM

## 2012-10-09 DIAGNOSIS — C50119 Malignant neoplasm of central portion of unspecified female breast: Secondary | ICD-10-CM

## 2012-10-09 LAB — CBC WITH DIFFERENTIAL/PLATELET
BASO%: 1 % (ref 0.0–2.0)
Eosinophils Absolute: 0.2 10*3/uL (ref 0.0–0.5)
LYMPH%: 19.5 % (ref 14.0–49.7)
MCHC: 34.4 g/dL (ref 31.5–36.0)
MONO#: 0.3 10*3/uL (ref 0.1–0.9)
NEUT#: 4.1 10*3/uL (ref 1.5–6.5)
Platelets: 250 10*3/uL (ref 145–400)
RBC: 3.97 10*6/uL (ref 3.70–5.45)
RDW: 13.2 % (ref 11.2–14.5)
WBC: 5.8 10*3/uL (ref 3.9–10.3)
lymph#: 1.1 10*3/uL (ref 0.9–3.3)

## 2012-10-09 LAB — CANCER ANTIGEN 27.29: CA 27.29: 22 U/mL (ref 0–39)

## 2012-10-09 LAB — COMPREHENSIVE METABOLIC PANEL (CC13)
ALT: 21 U/L (ref 0–55)
Albumin: 4.3 g/dL (ref 3.5–5.0)
CO2: 24 mEq/L (ref 22–29)
Calcium: 9.9 mg/dL (ref 8.4–10.4)
Chloride: 105 mEq/L (ref 98–107)
Glucose: 92 mg/dl (ref 70–99)
Potassium: 4.2 mEq/L (ref 3.5–5.1)
Sodium: 139 mEq/L (ref 136–145)
Total Protein: 6.7 g/dL (ref 6.4–8.3)

## 2012-10-16 ENCOUNTER — Ambulatory Visit (HOSPITAL_BASED_OUTPATIENT_CLINIC_OR_DEPARTMENT_OTHER): Payer: BC Managed Care – PPO | Admitting: Oncology

## 2012-10-16 VITALS — BP 111/74 | HR 71 | Temp 98.0°F | Resp 20 | Ht 65.0 in | Wt 128.6 lb

## 2012-10-16 DIAGNOSIS — C50119 Malignant neoplasm of central portion of unspecified female breast: Secondary | ICD-10-CM

## 2012-10-16 DIAGNOSIS — Z853 Personal history of malignant neoplasm of breast: Secondary | ICD-10-CM

## 2012-10-16 DIAGNOSIS — Z170 Estrogen receptor positive status [ER+]: Secondary | ICD-10-CM

## 2012-10-16 NOTE — Progress Notes (Signed)
ID: ETHA SUMAN   DOB: May 01, 1946  MR#: [id number]  WUJ#:[id number]  PCP: Marga Melnick, MD GYN:  SUCicero Duck OTHER MD: Chipper Herb   HISTORY OF PRESENT ILLNESS: In July of 2009, she had her screening mammogram which was essentially negative but because the patient reported a possible right breast mass, she had a diagnostic right breast mammogram and ultrasound July 24, 2008.  This showed some asymmetric skin density underlying the medial nipple which on physical exam was mobile, superficial and pea sized.  Ultrasound showed an intradermal oval mass with increased sound transmission, most consistent with an epidermal inclusion cyst.    The patient then did well but in early October of this year, palpated a definite mass in the right breast and on October 15th, she also had a mass in the right posterior shoulder.  She wanted both of these removed and this was performed by Cicero Duck on September 23, 2009.  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  The margins were clear but the closest margin was less than 1 mm for the in situ component.  There was no evidence of lymphovascular invasion and the tumor appeared to be a Stage II (invasive component).  The prognostic marker showed the invasive cells to be ER positive at 100%, PR weakly positive at 7% with a borderline elevated Ki-67 at 23%.  The tumor showed questionable amplification by CISH with a ratio of 2.27.   With this information, the patient had bilateral diagnostic mammography October 15th finding heterogeneously dense breasts bilaterally with slight thickening of the areola on the right.  This was felt to be most likely post surgical.  Bilateral breast MRIs were obtained October 18th.  There was a 1.5 cm postoperative seroma in the medial subareolar aspect of the right breast with  vigorous rim enhancement anteriorly.  There were no other masses present in the right breast, right axilla, internal mammary area or on the left side.    With this information and given the close margin, Dr. Jamey Ripa proceeded to right lumpectomy and sentinel lymph node sampling October 21, 2009, the final report there ([id number]) showed only a minimal amount of ductal carcinoma in situ (1 mm) in the reexcision area.  There was no invasive component.  Both sentinel lymph nodes were negative.  Margins were now ample.  Her subsequent history is as detailed below.  INTERVAL HISTORY: Aaminah returns today for followup of her low risk breast cancer. The interval history is unremarkable. She had her husband just returned from a barge trip to [place] which she enjoyed.  REVIEW OF SYSTEMS: Her only complaint is low back pain. This is not new. She does some back exercises which helped a little. Otherwise she describes herself is moderately fatigued. She has some tenderness in the medial aspect of her right breast. A detailed review of systems was otherwise noncontributory  PAST MEDICAL HISTORY: Past Medical History  Diagnosis Date  . Arthritis   . Bronchitis   . Pneumonia     as OP X 2  . Fibromyalgia   . HX: breast cancer     completed radiation 01/2010  . Vitamin d deficiency     16 in  01/2009  . Hyperlipemia   . Chronic low back pain     bulging disc & spinal stenosis  . Cancer  R lumpectomy ; radiation; no chemotherapy  . Osteoporosis   Significant for history of mitral valve prolapse diagnosed through remote echocardiography, history of hyperlipidemia, history of fibromyalgia, status post excision of a Morton's neuroma in November of 2009 and status post hysterectomy with unilateral salpingo-oophorectomy in 1991 (the patient has one ovary still in place).  She has rare migraines, and she has some osteoarthritis symptoms involving particularly the lower back and both hips.    PAST SURGICAL  HISTORY: Past Surgical History  Procedure Date  . Cesarean section      G 2 P 2  . Abdominal hysterectomy 1990    USO for fibroid , Dr Laureen Ochs  . Cystectomy 2010  . Breast surgery 2010    R side lumpectomy; Dr Jamey Ripa    FAMILY HISTORY Family History  Problem Relation Age of Onset  . Heart disease Mother     AF  . Heart disease Father 28    MI   . Heart attack Paternal Aunt     < 65  . Heart attack Paternal Grandfather     > 55  . Heart attack Paternal Uncle     > 55  . Heart attack Paternal Grandmother     ? age  . Ulcers Mother   . Hypertension Mother   . Hypertension Brother   The patient's father died at the age of 77 from coronary artery disease.  He had two sisters, both of whom had breast cancer but the patient does not know at what age it was diagnosed.  The patient's mother is alive at age 36+.  The patient herself has two brothers, no sisters.  There is no history of ovarian cancer in the family.  GYNECOLOGIC HISTORY: She is GX P2, first pregnancy to term at age 66.  She took hormones for about 6 months but did not like them so stopped.    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  Her husband, Dimas Aguas, is a Clinical research associate with [employer] and [employer] here in town.  Daughter Valdez, 37, lives in [place] and teaches reading.  Son Luisa Hart, [place], lives in [place] and works for [employer].  The patient has no grandchildren.  She attends [employer] here in town.    ADVANCED DIRECTIVES: Not in place  HEALTH MAINTENANCE: History  Substance Use Topics  . Smoking status: Former Smoker    Quit date: 12/19/1970  . Smokeless tobacco: Not on file   Comment: 4-5 years up to 3 cigarettes / day  . Alcohol Use: No     Colonoscopy: never  PAP: s/p hystrectomy  Bone density:  Lipid panel:  Allergies  Allergen Reactions  . Morphine     Hives  . Albuterol     REACTION: heart racing  . Risedronate Sodium     Gi symptoms    . Ibandronate Sodium     Stomach cramps    Current Outpatient Prescriptions  Medication Sig Dispense Refill  . acetaminophen-codeine (TYLENOL #3) 300-30 MG per tablet Take 1 tablet by mouth. EVERY 6-8 HOURS AS NEEDED       . Cholecalciferol (VITAMIN D3) 1000 UNITS CAPS Take by mouth. Once daily      . eletriptan (RELPAX) 20 MG tablet One tablet by mouth at onset of headache. May repeat in 2 hours if headache persists or recurs. may repeat in 2 hours if necessary  9 tablet  0  . gabapentin (NEURONTIN) 100 MG capsule One  every 8 hours as needed  30 capsule  2  . traZODone (DESYREL) 50 MG tablet TAKE THREE TABLETS BY MOUTH AT BEDTIME  90 tablet  0    OBJECTIVE: Middle-aged white woman who appears well Filed Vitals:   10/16/12 1335  BP: 111/74  Pulse: 71  Temp: 98 F (36.7 C)  Resp: 20     Body mass index is 21.40 kg/(m^2).    ECOG FS: 0  Sclerae unicteric Oropharynx clear No cervical or supraclavicular adenopathy Lungs no rales or rhonchi Heart regular rate and rhythm Abd benign MSK no focal spinal tenderness, no peripheral edema Neuro: nonfocal Breasts: She status post right lumpectomy. The area in the medial aspect of her right breast that is a little bit tender is entirely normal to inspection and palpation. The right axilla is benign. The right breast is still slightly larger than the left. The left breast is entirely unremarkable.   LAB RESULTS: Lab Results  Component Value Date   WBC 5.8 10/09/2012   NEUTROABS 4.1 10/09/2012   HGB 12.6 10/09/2012   HCT 36.6 10/09/2012   MCV 92.1 10/09/2012   PLT 250 10/09/2012      Chemistry      Component Value Date/Time   NA 139 10/09/2012 1311   NA 141 06/01/2012 0959   K 4.2 10/09/2012 1311   K 4.1 06/01/2012 0959   CL 105 10/09/2012 1311   CL 108 06/01/2012 0959   CO2 24 10/09/2012 1311   CO2 24 06/01/2012 0959   BUN 16.0 10/09/2012 1311   BUN 17 06/01/2012 0959   CREATININE 0.9 10/09/2012 1311   CREATININE 0.9  06/01/2012 0959      Component Value Date/Time   CALCIUM 9.9 10/09/2012 1311   CALCIUM 9.1 06/01/2012 0959   ALKPHOS 85 10/09/2012 1311   ALKPHOS 59 06/01/2012 0959   AST 16 10/09/2012 1311   AST 21 06/01/2012 0959   ALT 21 10/09/2012 1311   ALT 18 06/01/2012 0959   BILITOT 0.50 10/09/2012 1311   BILITOT 0.8 06/01/2012 0959       Lab Results  Component Value Date   LABCA2 22 10/09/2012    No components found with this basename: [id number]    No results found for this basename: INR:1;PROTIME:1 in the last 168 hours  Urinalysis    Component Value Date/Time   COLORURINE lt. yellow 10/05/2010 1446   APPEARANCEUR Clear 10/05/2010 1446   LABSPEC 1.010 10/05/2010 1446   PHURINE 5.0 10/05/2010 1446   HGBUR negative 10/05/2010 1446   BILIRUBINUR neg 05/08/2012 1407   BILIRUBINUR negative 10/05/2010 1446   UROBILINOGEN 0.2 05/08/2012 1407   UROBILINOGEN 0.2 10/05/2010 1446   NITRITE neg 05/08/2012 1407   NITRITE negative 10/05/2010 1446   LEUKOCYTESUR moderate (2+) 05/08/2012 1407    STUDIES: Mm Digital Diagnostic Bilat  09/24/2012  *RADIOLOGY REPORT*  Clinical Data:    Status post right lumpectomyand radiation therapy for breast cancer in 2010.  DIGITAL DIAGNOSTIC BILATERAL MAMMOGRAM WITH CAD  Comparison:  Previous examinations.  Findings:  Stable scattered fibroglandular tissue in both breasts. Stable post lumpectomy and postradiation changes on the right.  No new findings suspicious for malignancy in either breast. Mammographic images were processed with CAD.  IMPRESSION: No evidence of malignancy.  A follow up bilateral diagnostic mammogram is recommended in one year.  BI-RADS CATEGORY 2:  Benign finding(s).   Original Report Authenticated By: Darrol Angel, M.D.     ASSESSMENT: 66 y.o. [place] woman status post  right breast excisional biopsy of what was initially thought to be a cyst but turned out to be an invasive and in situ ductal carcinoma, definitively excised November 2010  with clear margins and a negative sentinel lymph node.  The invasive tumor was T1a N0, grade 2, estrogen receptor 100%, progesterone receptor 7% positive, HER2 borderline by CISH but negative on the Oncotype DX.  MIB-1 was 23%, and the recurrence score by Oncotype was 20%, dropping to 12% if she took tamoxifen for 5 years; however, she was intolerant of tamoxifen despite several attempts, and she decided to forego aromatase inhibitors.   PLAN: Gerry is doing very well from a breast cancer point of view and we actually spent most of the visit today discussing colonoscopies. I strongly encouraged her to get this done. As far as breast cancer followup is concerned she is uncomfortable coming to our office and I am not in disagreement with her being followed by Dr. Alwyn Ren her primary care physician. All she will need is yearly mammography and yearly physician breast exam.  As of now accordingly no further appointments have been made for her here. She knows I will be glad to see her at any point in the future if and when the need arises.   MAGRINAT,GUSTAV C    10/16/2012

## 2012-10-19 ENCOUNTER — Ambulatory Visit (INDEPENDENT_AMBULATORY_CARE_PROVIDER_SITE_OTHER): Payer: BC Managed Care – PPO | Admitting: Family Medicine

## 2012-10-19 ENCOUNTER — Encounter: Payer: Self-pay | Admitting: Family Medicine

## 2012-10-19 VITALS — BP 107/73 | HR 81 | Temp 98.4°F | Ht 64.25 in | Wt 129.8 lb

## 2012-10-19 DIAGNOSIS — R21 Rash and other nonspecific skin eruption: Secondary | ICD-10-CM | POA: Insufficient documentation

## 2012-10-19 MED ORDER — HYDROXYZINE HCL 25 MG PO TABS: 25.0000 mg | ORAL_TABLET | Freq: Three times a day (TID) | ORAL | Status: DC | PRN

## 2012-10-19 MED ORDER — TRIAMCINOLONE ACETONIDE 0.1 % EX OINT: TOPICAL_OINTMENT | Freq: Two times a day (BID) | CUTANEOUS | Status: DC

## 2012-10-19 NOTE — Assessment & Plan Note (Signed)
New.  Pt's rash more consistent w/ contact reaction or small bites.  No evidence of shingles.  Start topical steroid.  Offered atarax- pt prefers to hold off at this time but script printed.  Reviewed supportive care and red flags that should prompt return.  Pt expressed understanding and is in agreement w/ plan.

## 2012-10-19 NOTE — Patient Instructions (Addendum)
This appears to be something you came in contact with or small bites Use the triamcinolone ointment twice daily for the itching Take the hydroxyzine as needed for itching Avoid overly hot showers- this will worsen the itching Call with any questions or concerns Hang in there!

## 2012-10-19 NOTE — Progress Notes (Signed)
  Subjective:    Patient ID: [name], female    DOB: 1946-01-05, 66 y.o.   MRN: [id number]  HPI Blisters on back- pt reports has cluster of blisters that are splitting open on R back.  Very itchy but no pain.  Pt unable to see area in question.  1st noticed 1 week ago.  No contacts w/ similar sxs.  Otherwise feeling well.  No pets.  No change in sleeping arrangements.   Review of Systems For ROS see HPI     Objective:   Physical Exam  Vitals reviewed. Constitutional: She appears well-developed and well-nourished. No distress.  Skin: Skin is warm and dry. Rash (pinpoint, discrete excoriated lesions extending from L shoulder to T7.  no vesicles noted, + dry skin) noted.          Assessment & Plan:

## 2012-10-25 ENCOUNTER — Other Ambulatory Visit: Payer: Self-pay | Admitting: Internal Medicine

## 2012-10-25 NOTE — Telephone Encounter (Signed)
Per Dr.Hopper ok #90/1 refill

## 2012-12-18 ENCOUNTER — Encounter (INDEPENDENT_AMBULATORY_CARE_PROVIDER_SITE_OTHER): Payer: Self-pay | Admitting: Surgery

## 2012-12-31 ENCOUNTER — Other Ambulatory Visit: Payer: Self-pay | Admitting: Internal Medicine

## 2013-01-01 NOTE — Telephone Encounter (Signed)
Dr.Hopper verify instructions 3 by mouth at bedtime

## 2013-01-01 NOTE — Telephone Encounter (Signed)
Correct 3 qhs # 270

## 2013-03-01 ENCOUNTER — Other Ambulatory Visit: Payer: Self-pay | Admitting: Internal Medicine

## 2013-03-20 ENCOUNTER — Telehealth: Payer: Self-pay | Admitting: Internal Medicine

## 2013-03-20 NOTE — Telephone Encounter (Signed)
Noted, patient with appointment 03/25/2013

## 2013-03-20 NOTE — Telephone Encounter (Signed)
Patient Information:  Caller Name: Teryl Lucy  Phone: [phone]  Patient: [name], [name]  Gender: Female  DOB: Jun 27, 1946  Age: 67 Years  PCP: Marga Melnick  Office Follow Up:  Does the office need to follow up with this patient?: No  Instructions For The Office: N/A  RN Note:  Pt has had intermittent episodes of severe fatique and dizziness at different times of the day.  Pt denies recent illness, hydration issues, stool changes or injuries. Pt has had episodes while driving, not severe, but dizzy. Pt was in kitchen on 4-1 and room started spinning, Pt felt like she would fall if she wasn't holding on.  When Fatique episodes hit, Pt is unable to get out of bed, has to lay around until she has energy to get up. Pt has noticed palpatations at different times w/ the dizziness and fatique.  Pt is currently asymtomatic.  All emergent symptoms ruled out per Dizziness protocol.  Pt transfered to office for appt needs within 2 weeks.  Pt verbalized understanding.  Symptoms  Reason For Call & Symptoms: Fatique and Dizziness  Reviewed Health History In EMR: Yes  Reviewed Medications In EMR: Yes  Reviewed Allergies In EMR: Yes  Reviewed Surgeries / Procedures: Yes  Date of Onset of Symptoms: 01/23/2013  Guideline(s) Used:  Dizziness  Disposition Per Guideline:   See Within 2 Weeks in Office  Reason For Disposition Reached:   Dizziness not present now, but is a chronic symptom (recurrent or ongoing AND lasting > 4 weeks)  Advice Given:  N/A  Patient Will Follow Care Advice:  YES

## 2013-03-22 ENCOUNTER — Encounter: Payer: Self-pay | Admitting: Lab

## 2013-03-25 ENCOUNTER — Encounter: Payer: Self-pay | Admitting: Internal Medicine

## 2013-03-25 ENCOUNTER — Telehealth: Payer: Self-pay

## 2013-03-25 ENCOUNTER — Ambulatory Visit (INDEPENDENT_AMBULATORY_CARE_PROVIDER_SITE_OTHER): Payer: BC Managed Care – PPO | Admitting: Internal Medicine

## 2013-03-25 VITALS — BP 118/80 | HR 67 | Temp 98.1°F | Wt 127.8 lb

## 2013-03-25 DIAGNOSIS — R000 Tachycardia, unspecified: Secondary | ICD-10-CM

## 2013-03-25 DIAGNOSIS — E875 Hyperkalemia: Secondary | ICD-10-CM

## 2013-03-25 DIAGNOSIS — R42 Dizziness and giddiness: Secondary | ICD-10-CM

## 2013-03-25 DIAGNOSIS — R5381 Other malaise: Secondary | ICD-10-CM

## 2013-03-25 LAB — CBC WITH DIFFERENTIAL/PLATELET
Basophils Relative: 0.7 % (ref 0.0–3.0)
Eosinophils Absolute: 0 10*3/uL (ref 0.0–0.7)
Hemoglobin: 13.4 g/dL (ref 12.0–15.0)
MCHC: 34.5 g/dL (ref 30.0–36.0)
MCV: 90.8 fl (ref 78.0–100.0)
Monocytes Absolute: 0.3 10*3/uL (ref 0.1–1.0)
Neutro Abs: 3.4 10*3/uL (ref 1.4–7.7)
Neutrophils Relative %: 68.2 % (ref 43.0–77.0)
RBC: 4.28 Mil/uL (ref 3.87–5.11)

## 2013-03-25 LAB — BASIC METABOLIC PANEL
CO2: 26 mEq/L (ref 19–32)
Chloride: 107 mEq/L (ref 96–112)
Creatinine, Ser: 1 mg/dL (ref 0.4–1.2)
Glucose, Bld: 95 mg/dL (ref 70–99)

## 2013-03-25 NOTE — Patient Instructions (Addendum)
Review and correct the record as indicated. Please share record with all medical staff seen.  If you activate the  My Chart system; lab & Xray results will be released directly  to you as soon as I review & address these through the computer. If you choose not to sign up for My Chart within 36 hours of labs being drawn; results will be reviewed & interpretation added before being copied & mailed, causing a delay in getting the results to you.If you do not receive that report within 7-10 days ,please call. Additionally you can use this system to gain direct  access to your records  if  out of town or @ an office of a  physician who is not in  the My Chart network.  This improves continuity of care & places you in control of your medical record.  

## 2013-03-25 NOTE — Progress Notes (Signed)
Subjective:    Patient ID: [name], female    DOB: [birth year], 67 y.o.   MRN: [id number]  HPI  She describes intermittent fatigue since July 2013 without specific trigger or exacerbating factor. The symptoms will last one-1-1/2 days and benefit with rest.  With these episodes she describes some orthopnea and subjective tachycardia. She does not document exertional dyspnea with episodes.  There is a history of tachycardia after use of albuterol metered-dose inhaler. She was evaluated by Dr. Shirlee Latch, her cardiologist. He recommended stress test but this was not pursued.   She has had at least 7 episodes of vertigo lasting less than 1 minute since December 2013. She had 4-5 in December/January. There is no specific trigger or activity for these. They're not associated with any visual change or auditory symptoms .     Review of Systems  Fever/ chills : no Night sweats: no                                                                                           Vision changes ( blurred/ double/ loss): no                                                                                               Hoarseness or swallowing dysfunction: no                                                                                         Bowel changes( constipation/ diarrhea):no                                                                                     Weight change: no   Exertional chest pain:no  Dyspnea on exertion:no  Cough: no Hemoptysis: no  New medications: no Leg swelling: no PND: no  Melena/ rectal bleeding: no Adenopathy: no Severe snoring:no  Daytime sleepiness: no Skin / hair / nail changes: no Temperature intolerance( heat/ cold) :no  Feeling depressed:no  Anhedonia:no Altered appetite: no Poor sleep/ Apnea : no  Abnormal bruising / bleeding or enlarged lymph nodes: no                                                                          PMH/ FH of thyroid disease: Daughter with hypothyroidism     Objective:   Physical Exam Gen.: Thin but healthy and well-nourished in appearance. Alert, appropriate and cooperative throughout exam. Head: Normocephalic without obvious abnormalities; hair fine  Ears: Some wax bilaterally but TMs visible. Whisper heard at 6 feet bilaterally. Air conduction greater than bone conduction bilaterally. Question lateralization to the right ear with a tuning fork  Eyes: No corneal or conjunctival inflammation noted. No lid lag or proptosis. Extraocular motion intact. No nystagmus Nose: External nasal exam reveals no deformity or inflammation. Nasal mucosa are pink and moist.  Mouth: Oral mucosa and oropharynx reveal no lesions or exudates. Teeth in good repair. Neck: No deformities, masses, or tenderness noted. Range of motion & Thyroid normal. Lungs: Normal respiratory effort; chest expands symmetrically. Lungs are clear to auscultation without rales, wheezes, or increased work of breathing. Heart: Normal rate and rhythm. Normal S1 and S2. No gallop, click, or rub. S4 w/o murmur. Abdomen: Bowel sounds normal; abdomen soft and nontender. No masses, organomegaly or hernias noted.                               Musculoskeletal/extremities: No clubbing, cyanosis, edema, or significant extremity  deformity noted. Range of motion normal .Tone & strength  Normal. Joints normal. Nail health good. Able to lie down & sit up w/o help. Negative SLR bilaterally Vascular: Carotid, radial artery, dorsalis pedis and  posterior tibial pulses are full and equal. No bruits present. Neurologic: Alert and oriented x3. Deep tendon reflexes symmetrical and normal.  Gait normal. Romberg testing and finger to nose testing normal .        Skin: Intact without suspicious lesions or rashes. Lymph: No cervical, axillary lymphadenopathy present. Psych: Mood and affect  are normal. Normally interactive                                                                                        Assessment & Plan:  #1 intermittent fatigue lasting 1-1-1/2 days without specific trigger. Associated is subjective tachycardia and orthopnea. It is possible that this represents a paroxysmal supraventricular tachycardia  #2 paroxysmal vertigo, unrelated to #1 by history.  Plan: Basic labs will be checked. An event monitor should be employed if she has recurrent fatigue with subjective tachycardia. The vertigo can be evaluated by otolaryngology in view of the questionable changes on tuning fork exam.

## 2013-03-25 NOTE — Telephone Encounter (Signed)
Left message on VM informing patient she needs to come in for potassium recheck (per DPR: ok to leave detailed message on VM)  Future order placed, patient instructed to call and scheduled a non fasting lab appointment for tomorrow

## 2013-03-25 NOTE — Telephone Encounter (Signed)
Message copied by Maurice Small on Mon Mar 25, 2013  5:28 PM ------      Message from: Pecola Lawless      Created: Mon Mar 25, 2013  5:24 PM       Elevated potassium  (K+) can occur if the blood sample sits for a while in the Lab  and the red blood cells break down. That process is verified if repeat potassium is within normal limits. Please repeat K+ 03/26/13.      All other lab results are excellent.       If you have the heart symptoms; please call to schedule a Holter monitor to evaluate the heart rhythm and rate.            If the vertigo symptoms recur; I recommend an ENT consultation because of the symptoms and the borderline abnormal tuning fork exam. Hopp ------

## 2013-03-26 ENCOUNTER — Other Ambulatory Visit (INDEPENDENT_AMBULATORY_CARE_PROVIDER_SITE_OTHER): Payer: BC Managed Care – PPO

## 2013-03-26 DIAGNOSIS — E875 Hyperkalemia: Secondary | ICD-10-CM

## 2013-03-26 NOTE — Telephone Encounter (Signed)
Patient coming in at 1:30 pm for labs

## 2013-03-29 ENCOUNTER — Telehealth: Payer: Self-pay

## 2013-03-29 NOTE — Telephone Encounter (Signed)
Per note from Dr.Hopper f/u with patient: Chart list morphine as an allergy, toxicology report was positive for codeine, morphine, hydromorphone, hydrocodone and gabapentin. We only have gabapentin and trazodone on med list, some of the results from toxicology indicate patient could be taking something else that we do not have listed on medication list. Per MD "This needs to be clarified for me to be able to continue prescribing trazodone and gabapentin .   I contacted the patient and she indicated that she is not taking anything that Dr.Hopper is not prescribing. Patient has not seen another MD, neither is patient taking medications of a friend, family member, or loved one.  I spoke with Dois Davenport our clinical manager and she indicated patient can come in to recollect urine samples. I will follow-up with patient on Monday 04/01/13

## 2013-04-01 ENCOUNTER — Telehealth: Payer: Self-pay | Admitting: *Deleted

## 2013-04-01 NOTE — Telephone Encounter (Signed)
Spoke with patient advised that in order to clear questions concerning urine drug screen she must come bal to the office in order for [place] to recollect the sample. Patient states that she came during the lunch hour last time and nobody was available to label and process the sample. Advised pt not to come between 12 pm-1:30pm to avoid that from happening again. Patient states "this has been a terrible experience and I have been very upset since Friday about this when I got the phone call from Chrae." I apologized to the patient for the anxiety this has caused her and the inconvenience this has caused her. Patient agreed to come back in again to have urine recollected, lab staff made aware.

## 2013-04-02 NOTE — Telephone Encounter (Signed)
[name] Sales promotion account executive of Toxicology) was in office on yesterday 04/01/13 and discussed this situation with Dr.Hopper, this was a lab error, he will further follow-up with patient

## 2013-04-08 ENCOUNTER — Telehealth: Payer: Self-pay | Admitting: Internal Medicine

## 2013-04-08 DIAGNOSIS — E785 Hyperlipidemia, unspecified: Secondary | ICD-10-CM

## 2013-04-08 DIAGNOSIS — Z0000 Encounter for general adult medical examination without abnormal findings: Secondary | ICD-10-CM

## 2013-04-08 DIAGNOSIS — E559 Vitamin D deficiency, unspecified: Secondary | ICD-10-CM

## 2013-04-08 NOTE — Telephone Encounter (Signed)
Patient would like to have labs done prior to her 6/23 CPE. She prefers to go to [facility]. 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

## 2013-04-08 NOTE — Telephone Encounter (Signed)
Orders placed.

## 2013-04-12 ENCOUNTER — Other Ambulatory Visit: Payer: Self-pay | Admitting: Dermatology

## 2013-04-18 ENCOUNTER — Encounter: Payer: Self-pay | Admitting: Internal Medicine

## 2013-05-28 ENCOUNTER — Other Ambulatory Visit: Payer: Self-pay | Admitting: Internal Medicine

## 2013-05-28 NOTE — Telephone Encounter (Signed)
Previously verified by MD, 3 by mouth at bedtime

## 2013-05-30 ENCOUNTER — Telehealth: Payer: Self-pay

## 2013-05-30 NOTE — Telephone Encounter (Addendum)
Patient called concerned about increased swelling of right breast which was treated with radiation in 2011.Dr.Murray recommends patient see surgeon.Patient does have appointment on 07/04/13.Dr.Murray will call patient.

## 2013-06-03 ENCOUNTER — Other Ambulatory Visit (INDEPENDENT_AMBULATORY_CARE_PROVIDER_SITE_OTHER): Payer: BC Managed Care – PPO

## 2013-06-03 DIAGNOSIS — E559 Vitamin D deficiency, unspecified: Secondary | ICD-10-CM

## 2013-06-03 DIAGNOSIS — E785 Hyperlipidemia, unspecified: Secondary | ICD-10-CM

## 2013-06-03 DIAGNOSIS — Z0000 Encounter for general adult medical examination without abnormal findings: Secondary | ICD-10-CM

## 2013-06-03 LAB — LDL CHOLESTEROL, DIRECT: Direct LDL: 122.6 mg/dL

## 2013-06-03 LAB — CBC WITH DIFFERENTIAL/PLATELET
Basophils Relative: 0.6 % (ref 0.0–3.0)
Eosinophils Relative: 0.9 % (ref 0.0–5.0)
MCV: 93.2 fl (ref 78.0–100.0)
Monocytes Absolute: 0.4 10*3/uL (ref 0.1–1.0)
Monocytes Relative: 7.4 % (ref 3.0–12.0)
Neutrophils Relative %: 74.2 % (ref 43.0–77.0)
Platelets: 249 10*3/uL (ref 150.0–400.0)
RBC: 4.11 Mil/uL (ref 3.87–5.11)
WBC: 5.4 10*3/uL (ref 4.5–10.5)

## 2013-06-03 LAB — HEPATIC FUNCTION PANEL
ALT: 12 U/L (ref 0–35)
Albumin: 4.2 g/dL (ref 3.5–5.2)
Alkaline Phosphatase: 59 U/L (ref 39–117)
Total Protein: 7.1 g/dL (ref 6.0–8.3)

## 2013-06-03 LAB — LIPID PANEL
Cholesterol: 211 mg/dL — ABNORMAL HIGH (ref 0–200)
HDL: 64.7 mg/dL (ref >39.00)
VLDL: 16.8 mg/dL (ref 0.0–40.0)

## 2013-06-03 LAB — BASIC METABOLIC PANEL
Chloride: 107 mEq/L (ref 96–112)
Creatinine, Ser: 1 mg/dL (ref 0.4–1.2)
GFR: 58.24 mL/min — ABNORMAL LOW (ref >60.00)
Potassium: 4.6 mEq/L (ref 3.5–5.1)

## 2013-06-03 LAB — TSH: TSH: 1.16 u[IU]/mL (ref 0.35–5.50)

## 2013-06-06 LAB — VITAMIN D 1,25 DIHYDROXY: Vitamin D 1, 25 (OH)2 Total: 56 pg/mL (ref 18–72)

## 2013-06-07 ENCOUNTER — Encounter (INDEPENDENT_AMBULATORY_CARE_PROVIDER_SITE_OTHER): Payer: Self-pay | Admitting: Surgery

## 2013-06-07 ENCOUNTER — Other Ambulatory Visit (INDEPENDENT_AMBULATORY_CARE_PROVIDER_SITE_OTHER): Payer: Self-pay

## 2013-06-07 ENCOUNTER — Ambulatory Visit (INDEPENDENT_AMBULATORY_CARE_PROVIDER_SITE_OTHER): Payer: BC Managed Care – PPO | Admitting: Surgery

## 2013-06-07 VITALS — BP 128/70 | HR 72 | Temp 97.1°F | Resp 15 | Ht 65.5 in | Wt 125.0 lb

## 2013-06-07 DIAGNOSIS — Z853 Personal history of malignant neoplasm of breast: Secondary | ICD-10-CM

## 2013-06-07 DIAGNOSIS — I890 Lymphedema, not elsewhere classified: Secondary | ICD-10-CM

## 2013-06-07 NOTE — Progress Notes (Signed)
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 She  Continues to note that the right breast seems to be getting larger since last visit, feels heavy and is tender and interferes with some of her normal activities because of the discomfort.  She has had no significant changes since the last visit here.  ROS: There have been no significant changes since the last visit here  EXAM: General: The patient is alert, oriented, generally healty appearing, NAD. Mood and affect are normal.  Breasts:  The right breast is status post central mastectomy with loss of the nipple areolar complex. It is tender but no dominant mass and no irregularity. The left is norma  Lymphatics: She has no axillary or supraclavicular adenopathy on either side.  Extremities: Full ROM of the surgical side with no lymphedema noted.  Data Reviewed: Clinical Data: Status post right lumpectomyand radiation therapy  for breast cancer in 2010.  DIGITAL DIAGNOSTIC BILATERAL MAMMOGRAM WITH CAD  Comparison: Previous examinations.  Findings: Stable scattered fibroglandular tissue in both breasts.  Stable post lumpectomy and postradiation changes on the right. No  new findings suspicious for malignancy in either breast.  Mammographic images were processed with CAD.  IMPRESSION:  No evidence of malignancy. A follow up bilateral diagnostic  mammogram is recommended in one year.  BI-RADS CATEGORY 2: Benign finding(s).  Original Report Authenticated By: Darrol Angel, M.D.   Impression: Hx right breast cancer, no evidence of recurrence. 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 Will ask  for PT consult to see if some help for the breast heaviness, enlargement is available.I gave her copies of her most recent mammogram and her MRI report from 2012

## 2013-06-07 NOTE — Patient Instructions (Signed)
We will make a referral to physical therapy to see if they can help with some of the symptoms in your right breast.  I will see back again in 6 months, after your next mammogram

## 2013-06-10 ENCOUNTER — Ambulatory Visit (INDEPENDENT_AMBULATORY_CARE_PROVIDER_SITE_OTHER): Payer: BC Managed Care – PPO | Admitting: Internal Medicine

## 2013-06-10 ENCOUNTER — Encounter: Payer: Self-pay | Admitting: Internal Medicine

## 2013-06-10 VITALS — BP 110/78 | HR 77 | Temp 98.5°F | Ht 65.25 in | Wt 124.0 lb

## 2013-06-10 DIAGNOSIS — E559 Vitamin D deficiency, unspecified: Secondary | ICD-10-CM

## 2013-06-10 DIAGNOSIS — M810 Age-related osteoporosis without current pathological fracture: Secondary | ICD-10-CM

## 2013-06-10 MED ORDER — BENZONATATE 200 MG PO CAPS: 200.0000 mg | ORAL_CAPSULE | Freq: Three times a day (TID) | ORAL | Status: DC | PRN

## 2013-06-10 MED ORDER — FLUTICASONE PROPIONATE 50 MCG/ACT NA SUSP: 1.0000 | Freq: Two times a day (BID) | NASAL | Status: DC | PRN

## 2013-06-10 NOTE — Patient Instructions (Addendum)
Plain Mucinex (NOT D) for thick secretions ;force NON dairy fluids .   Nasal cleansing in the shower as discussed with lather of mild shampoo.After 10 seconds wash off lather while  exhaling through nostrils. Make sure that all residual soap is removed to prevent irritation.  Fluticasone 1 spray in each nostril twice a day as needed. Use the "crossover" technique into opposite nostril spraying toward opposite ear @ 45 degree angle, not straight up into nostril.  Use a Neti pot daily only  as needed for significant sinus congestion; going from open side to congested side . As per the Standard of Care , screening Colonoscopy recommended @ 50 & every 5-10 years thereafter . More frequent monitor would be dictated by family history or findings @ Colonoscopy Share results with all non [facility] medical staff seen

## 2013-06-10 NOTE — Progress Notes (Signed)
  Subjective:    Patient ID: [name], female    DOB: 23-Jul-1946, 67 y.o.   MRN: [id number]  HPI  Arline Asp is here for a physical;acute issues include a cough for 9 weeks     Review of Systems   Initially she had some fever &  sore throat; these have resolved. The cough is mainly nonproductive except for some cloudy material in the morning. She describes sneezing and postnasal drainage.  She is not having itchy, watery eyes. She has no fever, chills or sweats.  She also denies frontal headache, facial pain, nasal purulence, otic pain, or otic discharge  She's had some hoarseness with the cough but denies significant dyspepsia or dysphasia.  She is not on an ACE inhibitor.     Objective:   Physical Exam  Gen.: Healthy and well-nourished in appearance. Alert, appropriate and cooperative throughout exam. Head: Normocephalic without obvious abnormalities Eyes: No corneal or conjunctival inflammation noted.  Extraocular motion intact. Monocular lenses worn. Ears: External  ear exam reveals no significant lesions or deformities. Canals reveal some wax bilaterally. Hearing is grossly normal bilaterally. Nose: External nasal exam reveals no deformity or inflammation. Nasal mucosa are pink and moist. No lesions or exudates noted. Septum  Slightly deviated to L Mouth: Oral mucosa and oropharynx reveal no lesions or exudates. Teeth in good repair. Neck: No deformities, masses, or tenderness noted. Range of motion & Thyroid normal. Lungs: Normal respiratory effort; chest expands symmetrically. Lungs are clear to auscultation without rales, wheezes, or increased work of breathing. Heart: Normal rate and rhythm. Normal S1 and S2. No gallop, click, or rub. S4 w/o murmur. Abdomen: Bowel sounds normal; abdomen soft and nontender. No masses, organomegaly or hernias noted.Aorta palpable ; no AAA Genitalia: As per Gyn                                  Musculoskeletal/extremities: No deformity or  scoliosis noted of  the thoracic or lumbar spine.  No clubbing, cyanosis, edema, or significant extremity  deformity noted. Range of motion normal .Tone & strength  Normal.Slight crepitus L knee. Joints normal. Isolated fungal nail changes Able to lie down & sit up w/o help. Negative SLR bilaterally Vascular: Carotid, radial artery, dorsalis pedis and  posterior tibial pulses are full and equal. No bruits present. Neurologic: Alert and oriented x3. Deep tendon reflexes symmetrical and normal.         Skin: Intact without suspicious lesions or rashes. Lymph: No cervical, axillary lymphadenopathy present. Psych: Mood and affect are normal. Normally interactive                                                                                       Assessment & Plan:  #1 comprehensive physical exam; no acute findings # 2 cough , ? extrinsic  Plan: see Orders  & Recommendations

## 2013-06-12 ENCOUNTER — Telehealth (INDEPENDENT_AMBULATORY_CARE_PROVIDER_SITE_OTHER): Payer: Self-pay

## 2013-06-12 NOTE — Telephone Encounter (Signed)
Message copied by Ronney Lion on Wed Jun 12, 2013  9:05 AM ------      Message from: Rise Paganini      Created: Tue Jun 11, 2013  3:18 PM      Regarding: Streck      Contact: [phone]       Patient stated that she is supposed to be referred to a physician that deals with fluid build up around the breast. Please call to discuss. Thank you. ------

## 2013-06-12 NOTE — Telephone Encounter (Signed)
Returned pts's call regarding her referral to PT.  Let the pt know that the referral was placed on June 20 and that [facility] is to call her to schedule her initial visit.  Pt stated that she has not heard from them and would like to get the ball rolling on this.  I let the pt know that our referral coordinator is out right now, but will be in later this morning and I will pass the message along to her.

## 2013-06-12 NOTE — Addendum Note (Signed)
Addended by: Edwena Felty T on: 06/12/2013 02:54 PM   Modules accepted: Orders

## 2013-06-13 ENCOUNTER — Telehealth (INDEPENDENT_AMBULATORY_CARE_PROVIDER_SITE_OTHER): Payer: Self-pay

## 2013-06-13 NOTE — Telephone Encounter (Signed)
Pt has not heard from the Rehab referral. Can you please call to check on the referral and call the pt back to let her know what's going on.

## 2013-06-17 NOTE — Telephone Encounter (Signed)
Pt walked into office today concerned about her PT referral.  I contacted [facility] and got pt scheduled for July 8.  Pt aware.

## 2013-06-18 ENCOUNTER — Ambulatory Visit (INDEPENDENT_AMBULATORY_CARE_PROVIDER_SITE_OTHER)
Admission: RE | Admit: 2013-06-18 | Discharge: 2013-06-18 | Disposition: A | Discharge status: Home / Self Care | Payer: BC Managed Care – PPO | Source: Ambulatory Visit | Attending: Internal Medicine | Admitting: Internal Medicine

## 2013-06-18 DIAGNOSIS — E559 Vitamin D deficiency, unspecified: Secondary | ICD-10-CM

## 2013-06-25 ENCOUNTER — Ambulatory Visit: Payer: BC Managed Care – PPO | Attending: Surgery | Admitting: Physical Therapy

## 2013-06-25 DIAGNOSIS — Z853 Personal history of malignant neoplasm of breast: Secondary | ICD-10-CM | POA: Insufficient documentation

## 2013-06-25 DIAGNOSIS — Z9010 Acquired absence of unspecified breast and nipple: Secondary | ICD-10-CM | POA: Insufficient documentation

## 2013-06-25 DIAGNOSIS — I890 Lymphedema, not elsewhere classified: Secondary | ICD-10-CM | POA: Insufficient documentation

## 2013-06-25 DIAGNOSIS — IMO0001 Reserved for inherently not codable concepts without codable children: Secondary | ICD-10-CM | POA: Insufficient documentation

## 2013-07-02 ENCOUNTER — Ambulatory Visit: Payer: BC Managed Care – PPO | Admitting: Physical Therapy

## 2013-07-04 ENCOUNTER — Ambulatory Visit (INDEPENDENT_AMBULATORY_CARE_PROVIDER_SITE_OTHER): Payer: Self-pay | Admitting: Surgery

## 2013-07-05 ENCOUNTER — Telehealth (INDEPENDENT_AMBULATORY_CARE_PROVIDER_SITE_OTHER): Payer: Self-pay | Admitting: General Surgery

## 2013-07-05 NOTE — Telephone Encounter (Signed)
[name] with [facility] called for order for breast prosthesis and bras. Order completed and in Dr Tenna Child box for signature.

## 2013-07-09 ENCOUNTER — Ambulatory Visit: Payer: BC Managed Care – PPO | Admitting: Physical Therapy

## 2013-07-24 ENCOUNTER — Other Ambulatory Visit: Payer: Self-pay

## 2013-07-26 ENCOUNTER — Other Ambulatory Visit (INDEPENDENT_AMBULATORY_CARE_PROVIDER_SITE_OTHER): Payer: Self-pay | Admitting: Surgery

## 2013-07-26 DIAGNOSIS — Z853 Personal history of malignant neoplasm of breast: Secondary | ICD-10-CM

## 2013-08-01 ENCOUNTER — Encounter: Payer: Self-pay | Admitting: Internal Medicine

## 2013-08-01 ENCOUNTER — Telehealth: Payer: Self-pay | Admitting: Internal Medicine

## 2013-08-01 MED ORDER — GABAPENTIN 100 MG PO CAPS: 100.0000 mg | ORAL_CAPSULE | Freq: Three times a day (TID) | ORAL | Status: DC

## 2013-08-01 NOTE — Telephone Encounter (Signed)
Patient just picked up her Gabapentin medication from the pharmacy and has questions about why there were no refills and needs clarification on how to take it. Please advise.

## 2013-08-01 NOTE — Telephone Encounter (Signed)
Spoke with the pharmacy and they filled an Rx from 06/08/2012. Advised the pharmacy that we will contact the patient. The pharmacy would like to have follow up to determine what they should on their end.

## 2013-08-01 NOTE — Telephone Encounter (Signed)
I called patient, patient was concerned that her rx she picked up today did not have any refills on it. I informed patient that per MD #5 refills ok'd. I will call the pharmacy and verify. I called pharmacy, spoke with [facility] rx was called in with 5 refills, they forgot to note the 5 refills on medication bottle given today.  I called patient back and she verbalized understanding that she has refills on file at the pharmacy

## 2013-08-01 NOTE — Telephone Encounter (Signed)
Called pharmacy gave verbal for gabapentin 100mg  tablets #30 with 5RF

## 2013-08-01 NOTE — Telephone Encounter (Signed)
R X total of 6 mos

## 2013-08-26 ENCOUNTER — Telehealth: Payer: Self-pay | Admitting: Internal Medicine

## 2013-08-26 ENCOUNTER — Encounter: Payer: Self-pay | Admitting: Internal Medicine

## 2013-08-26 ENCOUNTER — Emergency Department (HOSPITAL_COMMUNITY)
Admission: EM | Admit: 2013-08-26 | Discharge: 2013-08-26 | Disposition: A | Discharge status: Home / Self Care | Payer: BC Managed Care – PPO | Attending: Emergency Medicine | Admitting: Emergency Medicine

## 2013-08-26 ENCOUNTER — Encounter (HOSPITAL_COMMUNITY): Payer: Self-pay | Admitting: Emergency Medicine

## 2013-08-26 DIAGNOSIS — R220 Localized swelling, mass and lump, head: Secondary | ICD-10-CM | POA: Insufficient documentation

## 2013-08-26 DIAGNOSIS — H5789 Other specified disorders of eye and adnexa: Secondary | ICD-10-CM | POA: Insufficient documentation

## 2013-08-26 DIAGNOSIS — Z8639 Personal history of other endocrine, nutritional and metabolic disease: Secondary | ICD-10-CM | POA: Insufficient documentation

## 2013-08-26 DIAGNOSIS — Z79899 Other long term (current) drug therapy: Secondary | ICD-10-CM | POA: Insufficient documentation

## 2013-08-26 DIAGNOSIS — Z923 Personal history of irradiation: Secondary | ICD-10-CM | POA: Insufficient documentation

## 2013-08-26 DIAGNOSIS — Z862 Personal history of diseases of the blood and blood-forming organs and certain disorders involving the immune mechanism: Secondary | ICD-10-CM | POA: Insufficient documentation

## 2013-08-26 DIAGNOSIS — IMO0001 Reserved for inherently not codable concepts without codable children: Secondary | ICD-10-CM | POA: Insufficient documentation

## 2013-08-26 DIAGNOSIS — R609 Edema, unspecified: Secondary | ICD-10-CM

## 2013-08-26 DIAGNOSIS — G8929 Other chronic pain: Secondary | ICD-10-CM | POA: Insufficient documentation

## 2013-08-26 DIAGNOSIS — Z853 Personal history of malignant neoplasm of breast: Secondary | ICD-10-CM | POA: Insufficient documentation

## 2013-08-26 DIAGNOSIS — Z87891 Personal history of nicotine dependence: Secondary | ICD-10-CM | POA: Insufficient documentation

## 2013-08-26 DIAGNOSIS — Z8701 Personal history of pneumonia (recurrent): Secondary | ICD-10-CM | POA: Insufficient documentation

## 2013-08-26 DIAGNOSIS — Z8739 Personal history of other diseases of the musculoskeletal system and connective tissue: Secondary | ICD-10-CM | POA: Insufficient documentation

## 2013-08-26 NOTE — Telephone Encounter (Signed)
Pt advised to seek treatment at the ER where she is located. States she is has swelling up in to her leg on the left side, droopiness of the left eye lid and broken blood vessels in her left eye.

## 2013-08-26 NOTE — ED Notes (Signed)
Pt. reports left ankle swelling with slight pain denies injury or fall. Ambulatory.

## 2013-08-26 NOTE — Telephone Encounter (Signed)
Patient Information:  Caller Name: Filomena  Phone: [phone]  Patient: Sara, [name]  Gender: Female  DOB: [birth year]  Age: 67 Years  PCP: Marga Melnick  Office Follow Up:  Does the office need to follow up with this patient?: Yes  Instructions For The Office: Makinzi is out of town until 09/03/13. Please call her at [phone]   Symptoms  Reason For Call & Symptoms: Deya states she had sent message to my chart at 11:11 on 08/26/13. States she has swelling in her feet and ankles up to her calves onset 08/24/13.  Left leg > right leg. Denies pain - states " they just feel funny.." no shortness of breath. Swelling has improved with elevation but is still present. Per leg edema has go to ED now or office with PCP approval. Is out of town until 09/03/13 of town. Asking if she should continue to elevate legs. Advised to continue to elevate until heard from office. PLEASE CALL [name] at [phone].  Reviewed Health History In EMR: Yes  Reviewed Medications In EMR: Yes  Reviewed Allergies In EMR: Yes  Reviewed Surgeries / Procedures: Yes  Date of Onset of Symptoms: Unknown  Guideline(s) Used:  Leg Swelling and Edema  Disposition Per Guideline:   Go to ED Now (or to Office with PCP Approval)  Reason For Disposition Reached:   Thigh, calf, or ankle swelling in both legs, but one side is definitely more swollen  Advice Given:  N/A  Patient Will Follow Care Advice:  YES

## 2013-08-26 NOTE — ED Provider Notes (Signed)
CSN: [id number]     Arrival date & time 08/26/13  1921 History   First MD Initiated Contact with Patient 08/26/13 2003     Chief Complaint  Patient presents with  . Joint Swelling   (Consider location/radiation/quality/duration/timing/severity/associated sxs/prior Treatment) HPI Patient has had several days of bilateral ankle swelling left greater than right. Patient states she walks and is on her feet often for prolonged periods. She says the swelling has resolved since she has kept her legs elevated. She's had no pain in her ankles or calves. She denies any shortness of breath especially with exertion or lying flat. She has no recent surgeries or extended travel. Is no history of thromboembolic disease.  Patient also states she's had this puffiness to her bilateral upper eyelids and redness to the sclera of her eyes for the past week. She's had no eye pain or vision changes. No new known allergen exposures. No trauma. Swelling and redness have greatly improved. Patient discussed symptoms with her primary doctor who suggested she be evaluated in the emergency department for possible blood clot. Past Medical History  Diagnosis Date  . Arthritis   . Bronchitis   . Pneumonia     as OP X 2; no Pneumovax  . Fibromyalgia   . HX: breast cancer     completed radiation 01/2010  . Vitamin D deficiency     18 in  01/2009  . Hyperlipemia   . Chronic low back pain     bulging disc & spinal stenosis  . Breast cancer     R lumpectomy ; radiation; no chemotherapy  . Osteoporosis    Past Surgical History  Procedure Laterality Date  . Cesarean section       G 2 P 2  . Abdominal hysterectomy  1990    USO for fibroid , Dr Laureen Ochs  . Cystectomy  2010  . Breast surgery  2010    R side lumpectomy; Dr Jamey Ripa  . No colonoscopy      "I just don't want to do it". SOC reviewed   Family History  Problem Relation Age of Onset  . Atrial fibrillation Mother     coumadin  . Ulcers Mother   .  Hypertension Mother   . Heart attack Father 23    died @ 76  . Heart attack Paternal Aunt     < 65  . Heart attack Paternal Grandfather     > 55  . Heart attack Paternal Uncle     > 55  . Heart attack Paternal Grandmother     < 65  . Hypertension Brother   . Thyroid disease Daughter     on Synthroid since high school  . Diabetes Neg Hx   . Stroke Neg Hx    History  Substance Use Topics  . Smoking status: Former Smoker    Quit date: 12/19/1970  . Smokeless tobacco: Not on file     Comment: 4-5 years up to 3 cigarettes / day  . Alcohol Use: No   OB History   Grav Para Term Preterm Abortions TAB SAB Ect Mult Living                 Review of Systems  Constitutional: Negative for fever and chills.  HENT: Positive for facial swelling. Negative for neck pain.   Eyes: Positive for redness.  Respiratory: Negative for cough, shortness of breath and wheezing.   Cardiovascular: Positive for leg swelling. Negative for chest pain and palpitations.  Gastrointestinal: Negative for nausea, vomiting, abdominal pain and diarrhea.  Musculoskeletal: Negative for myalgias and back pain.  Skin: Negative for rash and wound.  Neurological: Negative for dizziness, weakness, light-headedness, numbness and headaches.  All other systems reviewed and are negative.    Allergies  Morphine; Albuterol; Risedronate sodium; and Ibandronate sodium  Home Medications   Current Outpatient Rx  Name  Route  Sig  Dispense  Refill  . acetaminophen (TYLENOL) 500 MG tablet   Oral   Take 1,000 mg by mouth daily.         Marland Kitchen gabapentin (NEURONTIN) 100 MG capsule   Oral   Take 1 capsule (100 mg total) by mouth 3 (three) times daily. One every 8 hours as needed   30 capsule   5   . RELPAX 20 MG tablet      TAKE ONE (1) TABLET AT ONSET OF HEADACHE. MAY REPEAT IN 2 HOURS IF HEADACHE PERSISTS OR RECURS   9 tablet   1   . traZODone (DESYREL) 50 MG tablet   Oral   Take 150 mg by mouth at bedtime.           BP 164/98  Pulse 77  Temp(Src) 98.1 F (36.7 C) (Oral)  Resp 20  SpO2 97% Physical Exam  Nursing note and vitals reviewed. Constitutional: She is oriented to person, place, and time. She appears well-developed and well-nourished. No distress.  HENT:  Head: Normocephalic and atraumatic.  Mouth/Throat: Oropharynx is clear and moist.  Patient has small bilateral ptosis to 2 minimally swollen upper eyelid. Mild left greater than right scleral redness. No evidence of any intraocular injury. no hyphema. Full range of ocular motion without pain.  Eyes: EOM are normal. Pupils are equal, round, and reactive to light.  Neck: Normal range of motion. Neck supple.  Cardiovascular: Normal rate and regular rhythm.   Pulmonary/Chest: Effort normal and breath sounds normal. No respiratory distress. She has no wheezes. She has no rales. She exhibits no tenderness.  Abdominal: Soft. Bowel sounds are normal. She exhibits no distension and no mass. There is no tenderness. There is no rebound and no guarding.  Musculoskeletal: Normal range of motion. She exhibits no edema and no tenderness.  Range of motion of bilateral knees and ankles. No tenderness to palpation. She has 2+ dorsalis pedis bilaterally. She has no appreciable swelling in bilateral ankles. She has no calf swelling bilaterally. She has no calf tenderness bilaterally. No cords present bilaterally.  Neurological: She is alert and oriented to person, place, and time.  Patient is alert and oriented x3 with clear, goal oriented speech. Patient has 5/5 motor in all extremities. Sensation is intact to light touch. Bilateral finger-to-nose is normal with no signs of dysmetria. Patient has a normal gait and walks without assistance.   Skin: Skin is warm and dry. No rash noted. No erythema.  Psychiatric: She has a normal mood and affect. Her behavior is normal.    ED Course  Procedures (including critical care time) Labs Review Labs Reviewed -  No data to display Imaging Review No results found.  MDM   1. Peripheral edema    I do not suspect patient's 2 complaints are related. Patient likely has had environmental allergen exposure causing mild conjunctivitis. There is no evidence of infectious process. No purulence. No fevers chills. Patient was offered Claritin or Zyrtec but states that she has side effects from these medications and declined to take them. As for the patient's lower extremity swelling  I believe this is all related to peripheral edema due to poor circulation and prolonged standing. Likelihood for DVT is very low. I discussed this with the patient and offered emergency department ultrasound for 100% certainty to rule out DVT. She has declined further evaluation in the emergency department. The patient is advised to wear compression stockings and keep legs elevated when sitting. She is urged to return to the emergency department immediately for worsening swelling, calf pain, and especially for any chest pain or shortness of breath. Patient has been urged to followup with her primary doctor.    Loren Racer, MD 08/26/13 2038

## 2013-08-26 NOTE — Telephone Encounter (Signed)
Patient Information: ° Caller Name: [name] ° Phone: [phone] ° Patient: [name], [name] ° Gender: Female ° DOB: 05/27/1946 ° Age: 67 Years ° PCP: Hopper, William ° °Office Follow Up: ° Does the office need to follow up with this patient?: Yes ° Instructions For The Office: [name] is out of town until 09/03/13. Please call her at [phone] ° ° °Symptoms ° Reason For Call & Symptoms: [name] states she had sent message to my chart at 11:11 on 08/26/13. States she has swelling in her feet and ankles up to her calves onset 08/24/13.  Left leg > right leg. Denies pain - states " they just feel funny.." no shortness of breath. Swelling has improved with elevation but is still present. Per leg edema has go to ED now or office with PCP approval. Is out of town until 09/03/13 of town. Asking if she should continue to elevate legs. Advised to continue to elevate until heard from office. PLEASE CALL [name] at [phone]. ° Reviewed Health History In EMR: Yes ° Reviewed Medications In EMR: Yes ° Reviewed Allergies In EMR: Yes ° Reviewed Surgeries / Procedures: Yes ° Date of Onset of Symptoms: Unknown ° °Guideline(Santana) Used: ° Leg Swelling and Edema ° °Disposition Per Guideline:  ° Go to ED Now (or to Office with PCP Approval) ° °Reason For Disposition Reached:  ° Thigh, calf, or ankle swelling in both legs, but one side is definitely more swollen ° °Advice Given: ° N/A ° °Patient Will Follow Care Advice: ° YES ° ° °

## 2013-08-27 ENCOUNTER — Telehealth: Payer: Self-pay | Admitting: General Practice

## 2013-08-27 ENCOUNTER — Telehealth: Payer: Self-pay | Admitting: Internal Medicine

## 2013-08-27 NOTE — Telephone Encounter (Signed)
Poke with pt yesterday after receiving a CAN message. Pt was advised to seek treatment at the ER. Since she is out of town.

## 2013-08-27 NOTE — Telephone Encounter (Signed)
Encounter closed per protocol of pt having an appt with [facility] tomorrow.

## 2013-08-27 NOTE — Telephone Encounter (Signed)
Sudden onset of episode of swelling feet, ankles, legs yesterday 9/8.  Noting broken blood vessels in eyes, puffy eyelids.  Seen in ER with peripheral edema - had BP readings of 164/98,   197/108. Follow up in Pharmacy today 9/9 with BP readings of 158/105,  164/93,  150/104 after resting.   Triaged in elevated BP readings, Appt tomorrow 9/10  At 1:00pm Dr Alwyn Ren.

## 2013-08-28 ENCOUNTER — Ambulatory Visit (INDEPENDENT_AMBULATORY_CARE_PROVIDER_SITE_OTHER): Payer: BC Managed Care – PPO | Admitting: Internal Medicine

## 2013-08-28 ENCOUNTER — Encounter: Payer: Self-pay | Admitting: Internal Medicine

## 2013-08-28 VITALS — BP 163/83 | HR 75 | Temp 98.4°F | Wt 124.6 lb

## 2013-08-28 DIAGNOSIS — R609 Edema, unspecified: Secondary | ICD-10-CM

## 2013-08-28 DIAGNOSIS — R030 Elevated blood-pressure reading, without diagnosis of hypertension: Secondary | ICD-10-CM

## 2013-08-28 MED ORDER — HYDROCHLOROTHIAZIDE 12.5 MG PO CAPS: 12.5000 mg | ORAL_CAPSULE | Freq: Every day | ORAL | Status: DC

## 2013-08-28 NOTE — Progress Notes (Signed)
  Subjective:    Patient ID: [name], female    DOB: 1946-12-17, 67 y.o.   MRN: [id number]  HPI  The emergency room note 08/26/13 was reviewed. She was seen for ankle swelling. She also has some tingling along the left medial ankle/shin area. Other symptoms included some puffiness of the upper eyelids.  Her blood pressure was found to be 197/108 in the emergency room; she has no history of hypertension. She denied prolonged travel or change in sodium intake prior to her symptoms  She also had some erythema of the sclera without purulent discharge.  She does describe some chronic low back pain without exacerbation 9/8. Also she denied symptoms this urinary or pyuria.  She has compression hose but has not had to wear them as the edema has resolved without definitive treatment.  There is no personal or family history of deep venous thrombosis or pulmonary thromboemboli.      Review of Systems Renal and kidney function rechecked 06/03/13 were all normal.  She had no worrisome symptoms of dyspnea, calf pain, or chest pain.  Blood pressure range from 150/93-164/105     Objective:   Physical Exam  Appears healthy and well-nourished & in no acute distress  No carotid bruits are present.No neck pain distention present at 10 - 15 degrees.  Heart rhythm and rate are normal with no significant murmurs or gallops.  Chest is clear with no increased work of breathing  There is no evidence of aortic aneurysm or renal artery bruits.  Abdomen soft with no organomegaly or masses. No HJR  No clubbing, cyanosis or edema present.  Pedal pulses are intact   No ischemic skin changes are present . Nails healthy with chronic fungal changes   Alert and oriented. Strength, tone          Assessment & Plan:  #1 edema resolved  #2 elevated blood pressure without diagnosis of hypertension  Plan: See orders and recommendations

## 2013-08-28 NOTE — Patient Instructions (Addendum)
Minimal Blood Pressure Goal= AVERAGE < 140/90;  Ideal is an AVERAGE < 135/85. This AVERAGE should be calculated from @ least 5-7 BP readings taken @ different times of day on different days of week. You should not respond to isolated BP readings , but rather the AVERAGE for that week .Please bring your  blood pressure cuff to office visits to verify that it is reliable.It  can also be checked against the blood pressure device at the pharmacy. 

## 2013-08-29 ENCOUNTER — Telehealth: Payer: Self-pay | Admitting: General Practice

## 2013-08-29 NOTE — Telephone Encounter (Signed)
Pt.notified

## 2013-08-29 NOTE — Telephone Encounter (Signed)
Pt was seen in the office yesterday by Hopp. States she was never told what to do about her high blood pressures. Read the encounter note and advised pt to: Minimal Blood Pressure Goal= AVERAGE < 140/90; Ideal is an AVERAGE < 135/85. This AVERAGE should be calculated from @ least 5-7 BP readings taken @ different times of day on different days of week. You should not respond to isolated BP readings , but rather the AVERAGE for that week .Please bring your blood pressure cuff to office visits to verify that it is reliable.It can also be checked against the blood pressure device at the pharmacy. Per [employer]. Pt states that her diastolic BP readings have been consistently above 100 for three days and her systolic ranges around 160. Tried to get pt to calm down due to her seeming stressed about this. Please advise.

## 2013-08-29 NOTE — Telephone Encounter (Signed)
She should start the HCTZ and continue the blood pressure monitor as instructed.

## 2013-08-31 ENCOUNTER — Encounter (HOSPITAL_COMMUNITY): Payer: Self-pay | Admitting: *Deleted

## 2013-08-31 ENCOUNTER — Emergency Department (HOSPITAL_COMMUNITY)
Admission: EM | Admit: 2013-08-31 | Discharge: 2013-08-31 | Disposition: A | Discharge status: Home / Self Care | Payer: BC Managed Care – PPO | Attending: Emergency Medicine | Admitting: Emergency Medicine

## 2013-08-31 ENCOUNTER — Emergency Department (HOSPITAL_COMMUNITY): Payer: BC Managed Care – PPO

## 2013-08-31 DIAGNOSIS — R030 Elevated blood-pressure reading, without diagnosis of hypertension: Secondary | ICD-10-CM

## 2013-08-31 DIAGNOSIS — Z8709 Personal history of other diseases of the respiratory system: Secondary | ICD-10-CM | POA: Insufficient documentation

## 2013-08-31 DIAGNOSIS — IMO0001 Reserved for inherently not codable concepts without codable children: Secondary | ICD-10-CM | POA: Insufficient documentation

## 2013-08-31 DIAGNOSIS — M129 Arthropathy, unspecified: Secondary | ICD-10-CM | POA: Insufficient documentation

## 2013-08-31 DIAGNOSIS — Z79899 Other long term (current) drug therapy: Secondary | ICD-10-CM | POA: Insufficient documentation

## 2013-08-31 DIAGNOSIS — I10 Essential (primary) hypertension: Secondary | ICD-10-CM | POA: Insufficient documentation

## 2013-08-31 DIAGNOSIS — Z8701 Personal history of pneumonia (recurrent): Secondary | ICD-10-CM | POA: Insufficient documentation

## 2013-08-31 DIAGNOSIS — M810 Age-related osteoporosis without current pathological fracture: Secondary | ICD-10-CM | POA: Insufficient documentation

## 2013-08-31 DIAGNOSIS — Z923 Personal history of irradiation: Secondary | ICD-10-CM | POA: Insufficient documentation

## 2013-08-31 DIAGNOSIS — Z862 Personal history of diseases of the blood and blood-forming organs and certain disorders involving the immune mechanism: Secondary | ICD-10-CM | POA: Insufficient documentation

## 2013-08-31 DIAGNOSIS — Z853 Personal history of malignant neoplasm of breast: Secondary | ICD-10-CM | POA: Insufficient documentation

## 2013-08-31 DIAGNOSIS — Z8639 Personal history of other endocrine, nutritional and metabolic disease: Secondary | ICD-10-CM | POA: Insufficient documentation

## 2013-08-31 DIAGNOSIS — R609 Edema, unspecified: Secondary | ICD-10-CM

## 2013-08-31 DIAGNOSIS — Z87891 Personal history of nicotine dependence: Secondary | ICD-10-CM | POA: Insufficient documentation

## 2013-08-31 LAB — CBC WITH DIFFERENTIAL/PLATELET
Hemoglobin: 12.3 g/dL (ref 12.0–15.0)
Lymphs Abs: 1.3 10*3/uL (ref 0.7–4.0)
MCH: 31 pg (ref 26.0–34.0)
Monocytes Relative: 9 % (ref 3–12)
Neutro Abs: 2.8 10*3/uL (ref 1.7–7.7)
Neutrophils Relative %: 62 % (ref 43–77)
RBC: 3.97 MIL/uL (ref 3.87–5.11)

## 2013-08-31 LAB — URINALYSIS, ROUTINE W REFLEX MICROSCOPIC
Bilirubin Urine: NEGATIVE
Hgb urine dipstick: NEGATIVE
Specific Gravity, Urine: 1.007 (ref 1.005–1.030)
pH: 5 (ref 5.0–8.0)

## 2013-08-31 LAB — URINE MICROSCOPIC-ADD ON

## 2013-08-31 LAB — BASIC METABOLIC PANEL
BUN: 15 mg/dL (ref 6–23)
Chloride: 100 mEq/L (ref 96–112)
Glucose, Bld: 87 mg/dL (ref 70–99)
Potassium: 3.5 mEq/L (ref 3.5–5.1)

## 2013-08-31 LAB — POCT I-STAT TROPONIN I: Troponin i, poc: 0 ng/mL (ref 0.00–0.08)

## 2013-08-31 MED ORDER — HYDROCHLOROTHIAZIDE 12.5 MG PO CAPS: 25.0000 mg | ORAL_CAPSULE | Freq: Every day | ORAL | Status: DC

## 2013-08-31 NOTE — ED Provider Notes (Signed)
CSN: [id number]     Arrival date & time 08/31/13  1802 History   First MD Initiated Contact with Patient 08/31/13 1818     Chief Complaint  Patient presents with  . Hypertension   (Consider location/radiation/quality/duration/timing/severity/associated sxs/prior Treatment) HPI Patient presents to ED with cc htn and mild headache. Patient has no hx of htn but took her blood pressure at home on her husband's cuff and noticed it has been markedly elevated over the past 4 days. She has been taking her pressures up to 5 times a day. Highest reading at home was 190/110. She has had slight intermittent headache described as "sinus pressure" with when episode of sharp left sided cp radiating to the R arm and jaw 2 days ago. She c./o of continued intermittent jaw pain. Chest and jaw pain described as sharp, fleeting, and lasting seconds at a time. The patient has no cp, jaw pain today. Denies N/V/diaphoresis, or dyspnea.She denies sxs of stroke or TIA. She is non smoker, no hx of htn, dm, high cholesterol. No Family hx of stroke or MI. Denies use of cocaine or sympathomimetics. Patient called her pcp and was sent to ED by triage nurse.   Past Medical History  Diagnosis Date  . Arthritis   . Bronchitis   . Pneumonia     as OP X 2; no Pneumovax  . Fibromyalgia   . HX: breast cancer     completed radiation 01/2010  . Vitamin D deficiency     18 in  01/2009  . Hyperlipemia   . Chronic low back pain     bulging disc & spinal stenosis  . Breast cancer     R lumpectomy ; radiation; no chemotherapy  . Osteoporosis    Past Surgical History  Procedure Laterality Date  . Cesarean section       G 2 P 2  . Abdominal hysterectomy  1990    USO for fibroid , Dr Laureen Ochs  . Cystectomy  2010  . Breast surgery  2010    R side lumpectomy; Dr Jamey Ripa  . No colonoscopy      "I just don't want to do it". SOC reviewed   Family History  Problem Relation Age of Onset  . Atrial fibrillation Mother     coumadin   . Ulcers Mother   . Hypertension Mother   . Heart attack Father 65    died @ 97  . Heart attack Paternal Aunt     < 65  . Heart attack Paternal Grandfather     > 55  . Heart attack Paternal Uncle     > 55  . Heart attack Paternal Grandmother     < 65  . Hypertension Brother   . Thyroid disease Daughter     on Synthroid since high school  . Diabetes Neg Hx   . Stroke Neg Hx    History  Substance Use Topics  . Smoking status: Former Smoker    Quit date: 12/19/1970  . Smokeless tobacco: Not on file     Comment: 4-5 years up to 3 cigarettes / day  . Alcohol Use: No   OB History   Grav Para Term Preterm Abortions TAB SAB Ect Mult Living                 Review of Systems Ten systems reviewed and are negative for acute change, except as noted in the HPI.   Allergies  Morphine; Albuterol; Risedronate sodium; and Ibandronate  sodium  Home Medications   Current Outpatient Rx  Name  Route  Sig  Dispense  Refill  . acetaminophen (TYLENOL) 500 MG tablet   Oral   Take 1,000 mg by mouth daily.         Marland Kitchen gabapentin (NEURONTIN) 100 MG capsule   Oral   Take 1 capsule (100 mg total) by mouth 3 (three) times daily. One every 8 hours as needed   30 capsule   5   . hydrochlorothiazide (MICROZIDE) 12.5 MG capsule   Oral   Take 1 capsule (12.5 mg total) by mouth daily.   30 capsule   5   . RELPAX 20 MG tablet      TAKE ONE (1) TABLET AT ONSET OF HEADACHE. MAY REPEAT IN 2 HOURS IF HEADACHE PERSISTS OR RECURS   9 tablet   1   . traZODone (DESYREL) 50 MG tablet   Oral   Take 150 mg by mouth at bedtime.          BP 166/112  Pulse 73  Temp(Src) 98.2 F (36.8 C)  Resp 17  SpO2 95% Physical Exam  Physical Exam  Nursing note and vitals reviewed. Constitutional: She is oriented to person, place, and time. She appears well-developed and well-nourished. No distress.  HENT:  Head: Normocephalic and atraumatic.  Eyes: Conjunctivae normal and EOM are normal. Pupils are  equal, round, and reactive to light. No scleral icterus.  Neck: Normal range of motion.  Cardiovascular: Normal rate, regular rhythm and normal heart sounds.  Exam reveals no gallop and no friction rub.   No murmur heard. Pulmonary/Chest: Effort normal and breath sounds normal. No respiratory distress.  Abdominal: Soft. Bowel sounds are normal. She exhibits no distension and no mass. There is no tenderness. There is no guarding.  Neurological: She is alert and oriented to person, place, and time.  Skin: Skin is warm and dry. She is not diaphoretic.    ED Course  Procedures (including critical care time) Labs Review Labs Reviewed  BASIC METABOLIC PANEL - Abnormal; Notable for the following:    GFR calc non Af Amer 67 (*)    GFR calc Af Amer 78 (*)    All other components within normal limits  URINALYSIS, ROUTINE W REFLEX MICROSCOPIC - Abnormal; Notable for the following:    Leukocytes, UA SMALL (*)    All other components within normal limits  PRO B NATRIURETIC PEPTIDE - Abnormal; Notable for the following:    Pro B Natriuretic peptide (BNP) 140.9 (*)    All other components within normal limits  CBC WITH DIFFERENTIAL  URINE MICROSCOPIC-ADD ON  POCT I-STAT TROPONIN I   Imaging Review No results found.   ECG interpretation   Date: 09/05/2013  Rate: 64  Rhythm: normal sinus rhythm  QRS Axis: normal  Intervals: normal  ST/T Wave abnormalities: normal  Conduction Disutrbances: none  Narrative Interpretation:   Old EKG Reviewed: No significant changes noted (04/04/2013)    MDM   1. Hypertension   2. Edema   3. Elevated blood pressure reading without diagnosis of hypertension    Patient with htn. Concern in range for possibility of HTN Urgency?vs Emergency. No neurologic deficits or complaints at this time. Labs are pending. No si/sx of stroke or MI at this time.    Patient ekg unchanged form previous and unconcerning for ischemic changes. No Lab abnormalities.  Decreased GFR likley due to age, no elevation in creatinine.  UA appears contaminated. Pro BNP unconcerning  for CHF as cause of her transient peripheral edema.  I have discussed the case with my attending provider and we feel that the patient is not in htn urgency. The patient states that she was told to take the HCTZ only when she saw swelling in her ankles. She did not take any today or yesterday. On repeat exam the patient continues to have slight headache but states that it is similar to her usual headaches and she is unconcerned. No si/sx of stroke. Repeat neuro exam shows no focal deficit. Denies chest pain. I will have the patient begin taking HCTZ 25 mg daily and follow up with Dr. Alwyn Ren 1st thing Monday morning. Discussed return precautions. Patient expresses her understanding and agrees with plan of care.  The patient appears reasonably screened and/or stabilized for discharge and I doubt any other medical condition or other [facility] requiring further screening, evaluation, or treatment in the ED at this time prior to discharge.        Arthor Captain, PA-C 09/05/13 [phone]

## 2013-08-31 NOTE — ED Notes (Signed)
Pt has been checking BP regularly at home x 4 days, noticed to be elevated. Highest was 190/110. Pt reports 3/10 HA at this time but states "it feels more like sinus pressure." Pt also reports that on Thursday while driving she had a sudden onset of left sided CP radiating to right shoulder and jaw pain lasting appx 10 minutes. States it resolved on its own but continues to have intermittent jaw pain.

## 2013-08-31 NOTE — ED Notes (Signed)
Pt reports being seen here on Monday night for peripheral edema, having htn since then. No hx of htn, does not take meds. Called pcp and was told to come to ed. bp 179/112 pta.

## 2013-08-31 NOTE — ED Notes (Signed)
PA at bedside.

## 2013-09-02 ENCOUNTER — Telehealth: Payer: Self-pay | Admitting: General Practice

## 2013-09-02 NOTE — Telephone Encounter (Signed)
Patient is calling back to speak with Dois Davenport. She can be reached at [phone].

## 2013-09-02 NOTE — Telephone Encounter (Signed)
Caller: [name]/Patient; PCP: Marga Melnick; CB#: [phone]; Call regarding BP 172/108 (has been running high all week) headache ; Pt had some swelling earlier in the week/she went to the ED on MOnday night 08/26/13. Pt does not have a hx of hypertension. Pt f/u at the office the next day. See EPIC. Pts BP is 172/108. She also has a headache that is severe and RN advised ED. Pt argued with RN saying that the RN had advised she go to the Ed last week but when she saw her PCP he advised he would not have sent her. RN checked ePIC and saw note that indicated pt has had no hx of hypertension and MD prescribed HCTZ 5mg  QD. RN advised pt that medication was prescribed (she states he advised only to take it if swelling occurred in the ankles. Meanwhile pt reports vision change/spots before eyes that she has had "on and off all week - last time last night). RN maintained ED advised but since pt refused advised she at least start the medication as prescribed.

## 2013-09-02 NOTE — Telephone Encounter (Signed)
Attempted to call patient to discuss MD recommendation, had to leave message on voice mail to return our call.

## 2013-09-02 NOTE — Telephone Encounter (Signed)
HCTZ 12.5 mg recommend daily. Losartan 100 mg one half pill should also be initiated if blood pressure medication  not added in the emergency room. Dispense 30. Followup with blood pressure readings in 7-10 days on this combination.

## 2013-09-02 NOTE — Telephone Encounter (Signed)
Spoke with pt to follow up with hypertension concerns. Pt gave B/P readings of (150/104-->163/83-->141/97-->163/81-->179/112-->126/89--> and today 118/80-->105/79-->100/77) Advised pt to check B/P prior to taking medication due to subsequent drop in pressure that occurs with medication. I also advised to not take medication if B/P was less than 110/80. Losartan not sent to pharmacy due to drop in B/P readings.

## 2013-09-03 ENCOUNTER — Encounter: Payer: Self-pay | Admitting: Internal Medicine

## 2013-09-05 NOTE — ED Provider Notes (Signed)
Medical screening examination/treatment/procedure(s) were performed by non-physician practitioner and as supervising physician I was immediately available for consultation/collaboration.   [name]. Cipriana Biller, MD 09/05/13 [phone]

## 2013-09-09 ENCOUNTER — Encounter: Payer: Self-pay | Admitting: Internal Medicine

## 2013-09-17 ENCOUNTER — Telehealth: Payer: Self-pay | Admitting: Internal Medicine

## 2013-09-17 NOTE — Telephone Encounter (Signed)
Patient call and stated that she saw on mychart that she needed a tetanus shot. Also if possible can she have that order sent to [facility] office.

## 2013-09-18 NOTE — Telephone Encounter (Signed)
Spoke with the pt and informed her that she can call the [place] office and make an appt on the nurse schedule to have the Tdap.  Pt agreed.//AB/CMA

## 2013-10-10 ENCOUNTER — Ambulatory Visit
Admission: RE | Admit: 2013-10-10 | Discharge: 2013-10-10 | Disposition: A | Discharge status: Home / Self Care | Payer: BC Managed Care – PPO | Source: Ambulatory Visit | Attending: Surgery | Admitting: Surgery

## 2013-10-10 DIAGNOSIS — Z853 Personal history of malignant neoplasm of breast: Secondary | ICD-10-CM

## 2013-10-23 ENCOUNTER — Encounter (INDEPENDENT_AMBULATORY_CARE_PROVIDER_SITE_OTHER): Payer: Self-pay | Admitting: Surgery

## 2013-10-24 ENCOUNTER — Other Ambulatory Visit: Payer: Self-pay

## 2013-11-18 ENCOUNTER — Encounter: Payer: Self-pay | Admitting: Internal Medicine

## 2013-11-18 ENCOUNTER — Other Ambulatory Visit: Payer: Self-pay | Admitting: Internal Medicine

## 2013-11-18 NOTE — Telephone Encounter (Signed)
Please advise 

## 2013-11-18 NOTE — Telephone Encounter (Signed)
Trazodone refilled per protocol.

## 2014-01-28 ENCOUNTER — Telehealth: Payer: Self-pay | Admitting: Internal Medicine

## 2014-01-28 NOTE — Telephone Encounter (Signed)
Referral Ok

## 2014-01-28 NOTE — Telephone Encounter (Signed)
Pt is requesting a referral to  [facility] for fluid retention from the breast cancer.  Dr Margot Chimes has retired (he referred her before.)

## 2014-01-29 ENCOUNTER — Telehealth (INDEPENDENT_AMBULATORY_CARE_PROVIDER_SITE_OTHER): Payer: Self-pay | Admitting: *Deleted

## 2014-01-29 NOTE — Telephone Encounter (Signed)
LM for pt to return my call.  I was returning her call in regards to her request for a referral to PT for her lymphedema.  She last seen Dr. Margot Chimes in June 2014 and per his note she should follow up in 6 months.  I scheduled pt for an appt with Dr. Ninfa Linden on 02/11/14 @ 2:10pm to be seen prior to any referrals being ordered.

## 2014-01-29 NOTE — Telephone Encounter (Signed)
Pt returned my call and she stated she is going on a trip and will not be back until the end of March and she prefers a female doctor.  I rescheduled her appt for her to be seen by Dr. Barry Dienes on 03/14/14 @ 11:00am.  She is agreeable with this appt.

## 2014-02-11 ENCOUNTER — Ambulatory Visit (INDEPENDENT_AMBULATORY_CARE_PROVIDER_SITE_OTHER): Payer: BC Managed Care – PPO | Admitting: Surgery

## 2014-02-17 ENCOUNTER — Other Ambulatory Visit: Payer: Self-pay | Admitting: Internal Medicine

## 2014-03-14 ENCOUNTER — Ambulatory Visit (INDEPENDENT_AMBULATORY_CARE_PROVIDER_SITE_OTHER): Payer: BC Managed Care – PPO | Admitting: General Surgery

## 2014-03-14 ENCOUNTER — Other Ambulatory Visit (INDEPENDENT_AMBULATORY_CARE_PROVIDER_SITE_OTHER): Payer: Self-pay

## 2014-03-14 ENCOUNTER — Ambulatory Visit (INDEPENDENT_AMBULATORY_CARE_PROVIDER_SITE_OTHER): Payer: BC Managed Care – PPO | Admitting: Surgery

## 2014-03-14 ENCOUNTER — Encounter (INDEPENDENT_AMBULATORY_CARE_PROVIDER_SITE_OTHER): Payer: Self-pay | Admitting: General Surgery

## 2014-03-14 ENCOUNTER — Telehealth (INDEPENDENT_AMBULATORY_CARE_PROVIDER_SITE_OTHER): Payer: Self-pay

## 2014-03-14 VITALS — BP 128/80 | HR 75 | Temp 98.6°F | Resp 16 | Ht 65.0 in | Wt 123.0 lb

## 2014-03-14 DIAGNOSIS — Z853 Personal history of malignant neoplasm of breast: Secondary | ICD-10-CM

## 2014-03-14 DIAGNOSIS — I890 Lymphedema, not elsewhere classified: Secondary | ICD-10-CM

## 2014-03-14 NOTE — Patient Instructions (Signed)
We will refer you back to PT.  Will refer to [name].

## 2014-03-14 NOTE — Telephone Encounter (Signed)
Referral to Raeanne Gathers RFT for lymphedema placed in epic and given to [facility] to process per Dr Marlowe Aschoff order.

## 2014-03-14 NOTE — Progress Notes (Signed)
Chief Complaint  Patient presents with  . Breast Cancer Long Term Follow Up    HISTORY: Patient is a 68 year old female who is approximately 5 years status post breast conservation therapy for right-sided breast cancer. This was done by Dr. Margot Chimes.  She has continued to be plagued with right breast swelling. When she saw Dr. 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 She did go to PT and do some exercises, but she was out of the country and had several other things going on, and so she did not keep up with this. The pain has increased and she thinks it's worse than it was previously. She denies any new breast masses. She got her mammogram in November and this was negative for concerning features.  Past Medical History  Diagnosis Date  . Arthritis   . Bronchitis   . Pneumonia     as OP X 2; no Pneumovax  . Fibromyalgia   . HX: breast cancer     completed radiation 01/2010  . Vitamin D deficiency     18 in  01/2009  . Hyperlipemia   . Chronic low back pain     bulging disc & spinal stenosis  . Breast cancer     R lumpectomy ; radiation; no chemotherapy  . Osteoporosis     Past Surgical History  Procedure Laterality Date  . Cesarean section       G 2 P 2  . Abdominal hysterectomy  1990    USO for fibroid , Dr Maryruth Eve  . Cystectomy  2010  . Breast surgery  2010    R side lumpectomy; Dr Margot Chimes  . No colonoscopy      "I just don't want to do it". SOC reviewed    Current Outpatient Prescriptions  Medication Sig Dispense Refill  . eletriptan (RELPAX) 20 MG tablet Take 20 mg by mouth as needed for migraine. One tablet by mouth at onset of headache. May repeat in 2 hours if headache persists or recurs.      . gabapentin (NEURONTIN) 100 MG capsule Take 100 mg by mouth 3 (three) times daily as needed.       . hydrochlorothiazide (MICROZIDE) 12.5 MG capsule Take 2 capsules (25 mg total) by mouth daily.  30 capsule  5  . traZODone (DESYREL) 50  MG tablet TAKE 3 TABLETS BY MOUTH EVERY NIGHT AT BEDTIME  270 tablet  0   No current facility-administered medications for this visit.     Allergies  Allergen Reactions  . Morphine Hives and Palpitations     Because of a history of documented adverse serious drug reaction;Medi Alert bracelet  is recommended  . Albuterol Palpitations    REACTION: heart racing  . Fish Allergy Other (See Comments)    Severe headache  . Risedronate Sodium     Gi symptoms  . Ibandronate Sodium     Stomach cramps     Family History  Problem Relation Age of Onset  . Atrial fibrillation Mother     coumadin  . Ulcers Mother   . Hypertension Mother   . Heart attack Father 75    died @ 11  . Heart attack Paternal Aunt     < 46  . Heart attack Paternal Grandfather     > 55  . Heart attack Paternal Uncle     > 55  . Heart attack Paternal Grandmother     < 71  . Hypertension  Brother   . Thyroid disease Daughter     on Synthroid since high school  . Diabetes Neg Hx   . Stroke Neg Hx      History   Social History  . Marital Status: Married    Spouse Name: N/A    Number of Children: N/A  . Years of Education: N/A   Social History Main Topics  . Smoking status: Former Smoker    Quit date: 12/19/1970  . Smokeless tobacco: None     Comment: 4-5 years up to 3 cigarettes / day  . Alcohol Use: No  . Drug Use: No  . Sexual Activity: None   Other Topics Concern  . None   Social History Narrative  . None     REVIEW OF SYSTEMS - PERTINENT POSITIVES ONLY: 12 point review of systems negative other than HPI and PMH except for breast pain.    EXAM: Filed Vitals:   03/14/14 1108  BP: 128/80  Pulse: 75  Temp: 98.6 F (37 C)  Resp: 16    Wt Readings from Last 3 Encounters:  03/14/14 123 lb (55.792 kg)  08/28/13 124 lb 9.6 oz (56.518 kg)  06/10/13 124 lb (56.246 kg)     Gen:  No acute distress.  Well nourished and well groomed.   Neurological: Alert and oriented to person,  place, and time. Coordination normal.  Head: Normocephalic and atraumatic.  Eyes: Conjunctivae are normal. Pupils are equal, round, and reactive to light. No scleral icterus.  Neck: Normal range of motion. Neck supple. No tracheal deviation or thyromegaly present.  Cardiovascular: Normal rate, regular rhythm, normal heart sounds and intact distal pulses.  Exam reveals no gallop and no friction rub.  No murmur heard. Breast: right nipple surgically absent.  Right breast enlarged compared to left.  No seroma palpated.  Breast sore just to right of sternum.  No appreciable difference in arm size visibly.  No palpable masses or skin dimpling on either breast.  No axillary adenopathy.   Respiratory: Effort normal.  No respiratory distress. No chest wall tenderness. Breath sounds normal.  No wheezes, rales or rhonchi.  GI: Soft. Bowel sounds are normal. The abdomen is soft and nontender.  There is no rebound and no guarding.  Musculoskeletal: Normal range of motion. Extremities are nontender.  Lymphadenopathy: No cervical, preauricular, postauricular or axillary adenopathy is present Skin: Skin is warm and dry. No rash noted. No diaphoresis. No erythema. No pallor. No clubbing, cyanosis, or edema.   Psychiatric: Normal mood and affect. Behavior is normal. Judgment and thought content normal.    LABORATORY RESULTS: Available labs are reviewed   No results found for this or any previous visit (from the past 2160 hour(s)).   RADIOLOGY RESULTS: See E-Chart or I-Site for most recent results.  Images and reports are reviewed.  No results found.    ASSESSMENT AND PLAN: BREAST CANCER, HX OF No clinical evidence of disease.   Pt continues to have lymphedema of right breast and possibly the upper arm.  We will refer back to PT.  I also advised to try ice/ibuprofen.    I will follow up in 1 year.       Milus Height MD Surgical Oncology, General and [facility]      Visit Diagnoses: 1. BREAST CANCER, HX OF     Primary Care Physician: Unice Cobble, MD

## 2014-03-14 NOTE — Assessment & Plan Note (Addendum)
No clinical evidence of disease.   Pt continues to have lymphedema of right breast and possibly the upper arm.  We will refer back to PT.  I also advised to try ice/ibuprofen.    I will follow up in 1 year.

## 2014-03-17 ENCOUNTER — Ambulatory Visit: Payer: BC Managed Care – PPO | Admitting: Physical Therapy

## 2014-03-18 ENCOUNTER — Telehealth (INDEPENDENT_AMBULATORY_CARE_PROVIDER_SITE_OTHER): Payer: Self-pay | Admitting: *Deleted

## 2014-03-18 ENCOUNTER — Ambulatory Visit: Payer: BC Managed Care – PPO | Admitting: Physical Therapy

## 2014-03-18 NOTE — Telephone Encounter (Signed)
Called pt to make sure she was aware of her appt with Raeanne Gathers 4.13.15 @ 1:45 for Lymphedema.  LMOM for her to call back.  Anderson Malta

## 2014-03-18 NOTE — Telephone Encounter (Signed)
Pt called back and I relayed her appt information.  Pt understands and is in agreeance at this time.Anderson Malta

## 2014-03-25 ENCOUNTER — Encounter: Payer: Self-pay | Admitting: Internal Medicine

## 2014-03-25 ENCOUNTER — Other Ambulatory Visit (INDEPENDENT_AMBULATORY_CARE_PROVIDER_SITE_OTHER): Payer: BC Managed Care – PPO

## 2014-03-25 ENCOUNTER — Ambulatory Visit (INDEPENDENT_AMBULATORY_CARE_PROVIDER_SITE_OTHER): Payer: BC Managed Care – PPO | Admitting: Internal Medicine

## 2014-03-25 VITALS — BP 118/80 | HR 76 | Temp 98.0°F | Resp 14 | Wt 123.6 lb

## 2014-03-25 DIAGNOSIS — L608 Other nail disorders: Secondary | ICD-10-CM

## 2014-03-25 DIAGNOSIS — L601 Onycholysis: Secondary | ICD-10-CM

## 2014-03-25 LAB — CBC WITH DIFFERENTIAL/PLATELET
BASOS PCT: 0.9 % (ref 0.0–3.0)
Basophils Absolute: 0 10*3/uL (ref 0.0–0.1)
EOS ABS: 0.1 10*3/uL (ref 0.0–0.7)
Eosinophils Relative: 2.5 % (ref 0.0–5.0)
HEMATOCRIT: 38.5 % (ref 36.0–46.0)
HEMOGLOBIN: 13.1 g/dL (ref 12.0–15.0)
LYMPHS ABS: 1.1 10*3/uL (ref 0.7–4.0)
LYMPHS PCT: 24.4 % (ref 12.0–46.0)
MCHC: 34.2 g/dL (ref 30.0–36.0)
MCV: 90.7 fl (ref 78.0–100.0)
MONOS PCT: 7.8 % (ref 3.0–12.0)
Monocytes Absolute: 0.3 10*3/uL (ref 0.1–1.0)
NEUTROS ABS: 2.8 10*3/uL (ref 1.4–7.7)
Neutrophils Relative %: 64.4 % (ref 43.0–77.0)
Platelets: 258 10*3/uL (ref 150.0–400.0)
RBC: 4.24 Mil/uL (ref 3.87–5.11)
RDW: 13.7 % (ref 11.5–14.6)
WBC: 4.4 10*3/uL — ABNORMAL LOW (ref 4.5–10.5)

## 2014-03-25 LAB — TSH: TSH: 1.66 u[IU]/mL (ref 0.35–5.50)

## 2014-03-25 LAB — T4, FREE: Free T4: 0.85 ng/dL (ref 0.60–1.60)

## 2014-03-25 MED ORDER — KETOCONAZOLE 2 % EX CREA: 1.0000 "application " | TOPICAL_CREAM | Freq: Two times a day (BID) | CUTANEOUS | Status: DC

## 2014-03-25 NOTE — Addendum Note (Signed)
Addended by: Harl Bowie on: 03/25/2014 11:00 AM   Modules accepted: Orders

## 2014-03-25 NOTE — Progress Notes (Signed)
Pre visit review using our clinic review tool, if applicable. No additional management support is needed unless otherwise documented below in the visit note. 

## 2014-03-25 NOTE — Patient Instructions (Signed)
Your next office appointment will be determined based upon review of your pending labs. Those instructions will be transmitted to you through My Chart . 

## 2014-03-25 NOTE — Progress Notes (Signed)
   Subjective:    Patient ID: [name], female    DOB: 1946-03-23, 68 y.o.   MRN: [id number]  HPI  She's had some nail issues for approximately 2 years; these were minor until the last 3 months.It has been significantly progressive. There is irregularity of the nail line and lifting distally.  She saw Dr. Martin[place], dermatologist who performed scraping & prescribed Thymol alcohol solution . She has not been able to use this because of its odor.  She denies any trigger or exacerbating factors such as excess water exposure.  Review of the chart indicates her TSH has been therapeutic for the last 7 years. Last value was 03/25/13.   Review of Systems She has noted some chills without fever or associated weight loss Eye: no blurred, double ,loss of vision Cardiovascular: no palpitations; racing; irregularity ENT/GI: no constipation; diarrhea;hoarseness;dysphagia Neuro: no numbness or tingling; tremor Endo: no temperature intolerance to heat ,cold       Objective:   Physical Exam  Gen.:  well-nourished; in no acute distress Eyes: Extraocular motion intact; no lid lag or proptosis ,minor vertical nystagmus Neck: full ROM; no masses ; thyroid small  Heart: Normal rhythm and rate without significant murmur, gallop, or extra heart sounds Lungs: Chest clear to auscultation without rales,rales, wheezes Neuro:Deep tendon reflexes are equal and within normal limits; no tremor  Skin: Warm and dry without significant lesions or rashes; diffuse onycholysis of fingernails. Slightly dark R great toe nail Lymphatic: no cervical or axillary LA Psych: Normally communicative and interactive; no abnormal mood or affect clinically.         Assessment & Plan:  #1 onycholysis See orders

## 2014-03-31 ENCOUNTER — Ambulatory Visit: Payer: BC Managed Care – PPO | Attending: General Surgery | Admitting: Physical Therapy

## 2014-03-31 DIAGNOSIS — I890 Lymphedema, not elsewhere classified: Secondary | ICD-10-CM | POA: Insufficient documentation

## 2014-03-31 DIAGNOSIS — IMO0001 Reserved for inherently not codable concepts without codable children: Secondary | ICD-10-CM | POA: Insufficient documentation

## 2014-04-14 ENCOUNTER — Ambulatory Visit: Payer: BC Managed Care – PPO

## 2014-04-16 ENCOUNTER — Ambulatory Visit: Payer: BC Managed Care – PPO | Admitting: Physical Therapy

## 2014-04-28 ENCOUNTER — Ambulatory Visit: Payer: BC Managed Care – PPO | Attending: General Surgery | Admitting: Physical Therapy

## 2014-04-28 DIAGNOSIS — I890 Lymphedema, not elsewhere classified: Secondary | ICD-10-CM | POA: Diagnosis not present

## 2014-04-28 DIAGNOSIS — IMO0001 Reserved for inherently not codable concepts without codable children: Secondary | ICD-10-CM | POA: Diagnosis present

## 2014-05-01 ENCOUNTER — Ambulatory Visit: Payer: BC Managed Care – PPO

## 2014-05-01 DIAGNOSIS — IMO0001 Reserved for inherently not codable concepts without codable children: Secondary | ICD-10-CM | POA: Diagnosis not present

## 2014-05-13 ENCOUNTER — Encounter: Payer: BC Managed Care – PPO | Admitting: Physical Therapy

## 2014-05-16 ENCOUNTER — Telehealth: Payer: Self-pay | Admitting: *Deleted

## 2014-05-16 ENCOUNTER — Other Ambulatory Visit: Payer: Self-pay | Admitting: Internal Medicine

## 2014-05-16 DIAGNOSIS — M810 Age-related osteoporosis without current pathological fracture: Secondary | ICD-10-CM

## 2014-05-16 DIAGNOSIS — Z853 Personal history of malignant neoplasm of breast: Secondary | ICD-10-CM

## 2014-05-16 DIAGNOSIS — E782 Mixed hyperlipidemia: Secondary | ICD-10-CM

## 2014-05-16 DIAGNOSIS — E559 Vitamin D deficiency, unspecified: Secondary | ICD-10-CM

## 2014-05-16 NOTE — Telephone Encounter (Signed)
Spoke with pt advised labs ordered. 

## 2014-05-16 NOTE — Telephone Encounter (Signed)
Done

## 2014-05-16 NOTE — Telephone Encounter (Signed)
Pt called requesting labs prior to 7.1.15 appoint.  Please advise

## 2014-05-19 ENCOUNTER — Other Ambulatory Visit: Payer: Self-pay | Admitting: Internal Medicine

## 2014-05-19 ENCOUNTER — Ambulatory Visit: Payer: BC Managed Care – PPO | Attending: General Surgery | Admitting: Physical Therapy

## 2014-05-19 ENCOUNTER — Encounter: Payer: BC Managed Care – PPO | Admitting: Physical Therapy

## 2014-05-19 DIAGNOSIS — I890 Lymphedema, not elsewhere classified: Secondary | ICD-10-CM | POA: Diagnosis not present

## 2014-05-19 DIAGNOSIS — IMO0001 Reserved for inherently not codable concepts without codable children: Secondary | ICD-10-CM | POA: Diagnosis present

## 2014-05-19 NOTE — Telephone Encounter (Signed)
OK X1 

## 2014-05-22 ENCOUNTER — Encounter: Payer: BC Managed Care – PPO | Admitting: Physical Therapy

## 2014-06-11 ENCOUNTER — Other Ambulatory Visit (INDEPENDENT_AMBULATORY_CARE_PROVIDER_SITE_OTHER): Payer: BC Managed Care – PPO

## 2014-06-11 DIAGNOSIS — E782 Mixed hyperlipidemia: Secondary | ICD-10-CM

## 2014-06-11 DIAGNOSIS — E559 Vitamin D deficiency, unspecified: Secondary | ICD-10-CM

## 2014-06-11 DIAGNOSIS — M810 Age-related osteoporosis without current pathological fracture: Secondary | ICD-10-CM

## 2014-06-11 LAB — HEPATIC FUNCTION PANEL
ALBUMIN: 4.4 g/dL (ref 3.5–5.2)
ALK PHOS: 53 U/L (ref 39–117)
ALT: 15 U/L (ref 0–35)
AST: 20 U/L (ref 0–37)
Bilirubin, Direct: 0.1 mg/dL (ref 0.0–0.3)
TOTAL PROTEIN: 6.7 g/dL (ref 6.0–8.3)
Total Bilirubin: 0.6 mg/dL (ref 0.2–1.2)

## 2014-06-11 LAB — BASIC METABOLIC PANEL
BUN: 13 mg/dL (ref 6–23)
CO2: 25 meq/L (ref 19–32)
Calcium: 9.1 mg/dL (ref 8.4–10.5)
Chloride: 105 mEq/L (ref 96–112)
Creatinine, Ser: 0.9 mg/dL (ref 0.4–1.2)
GFR: 63.07 mL/min (ref >60.00)
GLUCOSE: 99 mg/dL (ref 70–99)
POTASSIUM: 4.1 meq/L (ref 3.5–5.1)
SODIUM: 138 meq/L (ref 135–145)

## 2014-06-11 LAB — LIPID PANEL
CHOLESTEROL: 215 mg/dL — AB (ref 0–200)
HDL: 64.8 mg/dL (ref >39.00)
LDL Cholesterol: 136 mg/dL — ABNORMAL HIGH (ref 0–99)
NONHDL: 150.2
Total CHOL/HDL Ratio: 3
Triglycerides: 70 mg/dL (ref 0.0–149.0)
VLDL: 14 mg/dL (ref 0.0–40.0)

## 2014-06-13 ENCOUNTER — Encounter: Payer: BC Managed Care – PPO | Admitting: Internal Medicine

## 2014-06-15 ENCOUNTER — Encounter: Payer: Self-pay | Admitting: Internal Medicine

## 2014-06-15 LAB — VITAMIN D 1,25 DIHYDROXY
VITAMIN D 1, 25 (OH) TOTAL: 60 pg/mL (ref 18–72)
VITAMIN D3 1, 25 (OH): 60 pg/mL

## 2014-06-18 ENCOUNTER — Ambulatory Visit (INDEPENDENT_AMBULATORY_CARE_PROVIDER_SITE_OTHER): Payer: BC Managed Care – PPO | Admitting: Internal Medicine

## 2014-06-18 ENCOUNTER — Encounter: Payer: Self-pay | Admitting: Internal Medicine

## 2014-06-18 VITALS — BP 108/70 | HR 77 | Temp 98.2°F | Ht 65.0 in | Wt 123.0 lb

## 2014-06-18 DIAGNOSIS — E782 Mixed hyperlipidemia: Secondary | ICD-10-CM

## 2014-06-18 DIAGNOSIS — Z0000 Encounter for general adult medical examination without abnormal findings: Secondary | ICD-10-CM

## 2014-06-18 DIAGNOSIS — R059 Cough, unspecified: Secondary | ICD-10-CM

## 2014-06-18 DIAGNOSIS — R05 Cough: Secondary | ICD-10-CM

## 2014-06-18 DIAGNOSIS — J310 Chronic rhinitis: Secondary | ICD-10-CM

## 2014-06-18 MED ORDER — AZITHROMYCIN 250 MG PO TABS: ORAL_TABLET | ORAL | Status: DC

## 2014-06-18 MED ORDER — MONTELUKAST SODIUM 10 MG PO TABS
10.0000 mg | ORAL_TABLET | Freq: Every day | ORAL | Status: DC
Start: 2014-06-18 — End: 2015-06-24

## 2014-06-18 NOTE — Patient Instructions (Signed)

## 2014-06-18 NOTE — Progress Notes (Signed)
Pre visit review using our clinic review tool, if applicable. No additional management support is needed unless otherwise documented below in the visit note. 

## 2014-06-18 NOTE — Progress Notes (Signed)
Subjective:    Patient ID: [name], female    DOB: 1946/09/14, 68 y.o.   MRN: [id number]  HPI   She  is here for a physical;acute issues include cough & chest congestion for several weeks.   A heart healthy diet is followed; exercise encompasses 45 minutes > 5  times per week as fitness & walking without symptoms.  Family history is positive for premature coronary disease. Advanced cholesterol testing reveals  LDL goal is less than 130 ; ideally < 100 . To date no statin.  Low dose ASA not taken  Review of Systems   She's had a cough for several weeks and attributes this to postnasal drainage. She denies frontal headache, facial pain, nasal purulence, otic pain,or  otic discharge. Some yellow sputum production. She also has no fever, chills, or sweats. Specifically denied are  chest pain, palpitations, dyspnea, or claudication.  Significant abdominal symptoms , memory deficit, or myalgias not present.Some cramping R foot.       Objective:   Physical Exam Gen.: Healthy and well-nourished in appearance. Alert, appropriate and cooperative throughout exam. Appears younger than stated age  Head: Normocephalic without obvious abnormalities; fine hair anteriorly. alopecia  Eyes: No corneal or conjunctival inflammation noted. Pupils equal round reactive to light and accommodation. Extraocular motion intact.  Ears: External  ear exam reveals no significant lesions or deformities. Canals clear .TMs normal. Hearing is grossly normal bilaterally. Nose: External nasal exam reveals no deformity or inflammation. Nasal mucosa are pink and moist. No lesions or exudates noted.   Mouth: Oral mucosa and oropharynx reveal no lesions or exudates. Teeth in good repair. Neck: No deformities, masses, or tenderness noted. Range of motion & Thyroid normal.. Lungs: Normal respiratory effort; chest expands symmetrically. Lungs are clear to auscultation without rales, wheezes, or increased work of  breathing.Dry cough. Heart: Normal rate and rhythm. Normal S1 and S2. No gallop, click, or rub.No murmur. Abdomen: Bowel sounds normal; abdomen soft and nontender. No masses, organomegaly or hernias noted. Genitalia: as per Gyn                                  Musculoskeletal/extremities: No deformity or scoliosis noted of  the thoracic or lumbar spine.  No clubbing, cyanosis, edema, or significant extremity  deformity noted. Range of motion normal .Tone & strength normal. Hand joints normal Fingernail  health good. Able to lie down & sit up w/o help. Negative SLR bilaterally Vascular: Carotid, radial artery, dorsalis pedis and  posterior tibial pulses are full and equal. No bruits present. Neurologic: Alert and oriented x3. Deep tendon reflexes symmetrical and normal.  Gait normal  .       Skin: Intact without suspicious lesions or rashes. Lymph: No cervical, axillary lymphadenopathy present. Psych: Mood and affect are normal. Normally interactive                                                                                        Assessment & Plan:  #1 comprehensive physical exam; no acute findings #2 cough ? From rhinitis  Plan: see Orders  & Recommendations

## 2014-06-23 ENCOUNTER — Telehealth: Payer: Self-pay

## 2014-06-23 ENCOUNTER — Other Ambulatory Visit: Payer: Self-pay | Admitting: Internal Medicine

## 2014-06-23 NOTE — Telephone Encounter (Addendum)
Received a fax from [place] at [address] [facility] that prior authorization is required on Relpax 20 mg. Patient ID [id number]. Prior authorization has been submitted via cover my meds.  Plan phone # [phone] [facility]

## 2014-06-23 NOTE — Telephone Encounter (Signed)
Prior authorization for Relpax could not be submitted through info provided by the pharmacy. States patient is not enrolled.

## 2014-06-24 NOTE — Telephone Encounter (Signed)
Left message for patient to return call...just need to obtain correct insurance name, insurance id # and group #

## 2014-06-24 NOTE — Telephone Encounter (Signed)
Left msg on triage requestingto speak with nurse with Dr. Linna Darner concerning the PA from yesterday. He think he gave you the wrong info...[name]

## 2014-06-24 NOTE — Telephone Encounter (Deleted)
Ins name:

## 2014-06-25 NOTE — Telephone Encounter (Addendum)
[facility] original fax had incorrect insurance information.   Form for prior authorization to [facility] has been filled out and patient's husband notified. He states he prefers to pick this form up

## 2014-06-25 NOTE — Telephone Encounter (Signed)
Pt called back upset that no one has returned her call.  Her insurance is [employer].  [facility].  Please call her.  When the pharmacy faxed [place] there was the fax number to [facility].   Patients number to call back is [phone] Husband.

## 2014-07-22 ENCOUNTER — Ambulatory Visit: Payer: BC Managed Care – PPO | Admitting: Internal Medicine

## 2014-08-16 ENCOUNTER — Other Ambulatory Visit: Payer: Self-pay | Admitting: Internal Medicine

## 2014-08-18 NOTE — Telephone Encounter (Signed)
OK X 3 mos 

## 2014-09-15 ENCOUNTER — Other Ambulatory Visit (INDEPENDENT_AMBULATORY_CARE_PROVIDER_SITE_OTHER): Payer: Self-pay | Admitting: General Surgery

## 2014-09-15 DIAGNOSIS — Z853 Personal history of malignant neoplasm of breast: Secondary | ICD-10-CM

## 2014-09-26 ENCOUNTER — Other Ambulatory Visit: Payer: Self-pay | Admitting: Dermatology

## 2014-10-13 ENCOUNTER — Ambulatory Visit
Admission: RE | Admit: 2014-10-13 | Discharge: 2014-10-13 | Disposition: A | Discharge status: Home / Self Care | Payer: BC Managed Care – PPO | Source: Ambulatory Visit | Attending: General Surgery | Admitting: General Surgery

## 2014-10-13 DIAGNOSIS — Z853 Personal history of malignant neoplasm of breast: Secondary | ICD-10-CM

## 2014-11-16 ENCOUNTER — Other Ambulatory Visit: Payer: Self-pay | Admitting: Internal Medicine

## 2014-11-17 NOTE — Telephone Encounter (Signed)
OK X1 

## 2015-01-26 ENCOUNTER — Other Ambulatory Visit: Payer: Self-pay | Admitting: Internal Medicine

## 2015-01-26 NOTE — Telephone Encounter (Signed)
OK X1 

## 2015-02-13 ENCOUNTER — Other Ambulatory Visit: Payer: Self-pay | Admitting: Internal Medicine

## 2015-02-16 NOTE — Telephone Encounter (Signed)
OK X3

## 2015-05-18 ENCOUNTER — Other Ambulatory Visit: Payer: Self-pay | Admitting: Internal Medicine

## 2015-05-19 NOTE — Telephone Encounter (Signed)
Sent refill to [facility] with appt info attach...[name]

## 2015-05-19 NOTE — Telephone Encounter (Signed)
OK X 1 with 1 refill Needs OV before additional refills LAST SEEN 7/15

## 2015-06-03 ENCOUNTER — Telehealth: Payer: Self-pay | Admitting: *Deleted

## 2015-06-03 ENCOUNTER — Telehealth: Payer: Self-pay | Admitting: Internal Medicine

## 2015-06-03 NOTE — Telephone Encounter (Signed)
A user error has taken place: duplicate call a nurse msg...[name]

## 2015-06-03 NOTE — Telephone Encounter (Signed)
Patient Name: [name]  DOB: [birth year]    Initial Comment Caller states she is having pain in lower left abdomen.    Nurse Assessment  Nurse: Mallie Mussel, RN, Alveta Heimlich Date/Time Eilene Ghazi Time): 06/03/2015 10:34:44 AM  Confirm and document reason for call. If symptomatic, describe symptoms. ---Caller states that she has lower left abdominal pain which began yesterday. The abdomen feels bloated to her. She states that the pain is constant. She rates the pain as 4 on 0-10 scale. Denies vomiting and diarrhea. Her last BM was this morning. She had a fever last night, but not this morning.  Has the patient traveled out of the country within the last 30 days? ---No  Does the patient require triage? ---Yes  Related visit to physician within the last 2 weeks? ---No  Does the PT have any chronic conditions? (i.e. diabetes, asthma, etc.) ---No     Guidelines    Guideline Title Affirmed Question Affirmed Notes  Abdominal Pain - Female [1] MILD-MODERATE pain AND [2] constant AND [3] present > 2 hours    Final Disposition User   See Physician within 4 Hours (or PCP triage) Mallie Mussel, RN, Alveta Heimlich    Comments  The triage outcome was for her to be seen in 4 hours. She prefers to see Dr. Linna Darner who only has an appointment at 4pm which is beyond the 4 hours recommended. No other appointments available with other doctors on the schedule. I called the backline and info given to [place]. I advised her that I will need a doctor's okay for her to wait until then. Caller states that she is okay to wait that long. Dr. Linna Darner wants her to go to an ER or UC to be seen.  Caller advised of Dr. Clayborn Heron instructions and she declined. She will be calling back to schedule an appointment.

## 2015-06-04 ENCOUNTER — Ambulatory Visit: Payer: Self-pay | Admitting: Internal Medicine

## 2015-06-15 ENCOUNTER — Other Ambulatory Visit: Payer: Self-pay | Admitting: Internal Medicine

## 2015-06-15 ENCOUNTER — Other Ambulatory Visit: Payer: Self-pay

## 2015-06-15 NOTE — Telephone Encounter (Signed)
OK X 3 mos  My retirement date is 12/19/2015; but I will be in office only T, Weds & Thurs during the months Oct-Dec.You should transition your care to another PCP by Oct 1,2016.

## 2015-06-15 NOTE — Telephone Encounter (Signed)
Please advise 

## 2015-06-16 ENCOUNTER — Other Ambulatory Visit: Payer: Self-pay | Admitting: Emergency Medicine

## 2015-06-16 MED ORDER — GABAPENTIN 100 MG PO CAPS: 100.0000 mg | ORAL_CAPSULE | Freq: Three times a day (TID) | ORAL | Status: DC | PRN

## 2015-06-17 ENCOUNTER — Other Ambulatory Visit: Payer: Self-pay | Admitting: Internal Medicine

## 2015-06-17 ENCOUNTER — Other Ambulatory Visit (INDEPENDENT_AMBULATORY_CARE_PROVIDER_SITE_OTHER): Payer: Self-pay

## 2015-06-17 DIAGNOSIS — E782 Mixed hyperlipidemia: Secondary | ICD-10-CM

## 2015-06-17 DIAGNOSIS — Z853 Personal history of malignant neoplasm of breast: Secondary | ICD-10-CM

## 2015-06-17 DIAGNOSIS — E559 Vitamin D deficiency, unspecified: Secondary | ICD-10-CM

## 2015-06-17 LAB — TSH: TSH: 1.55 u[IU]/mL (ref 0.35–4.50)

## 2015-06-17 LAB — CBC WITH DIFFERENTIAL/PLATELET
BASOS PCT: 0.9 % (ref 0.0–3.0)
Basophils Absolute: 0 10*3/uL (ref 0.0–0.1)
Eosinophils Absolute: 0.1 10*3/uL (ref 0.0–0.7)
Eosinophils Relative: 1.9 % (ref 0.0–5.0)
HEMATOCRIT: 38.1 % (ref 36.0–46.0)
Hemoglobin: 13 g/dL (ref 12.0–15.0)
LYMPHS ABS: 1.2 10*3/uL (ref 0.7–4.0)
Lymphocytes Relative: 23.6 % (ref 12.0–46.0)
MCHC: 34.1 g/dL (ref 30.0–36.0)
MCV: 91.1 fl (ref 78.0–100.0)
MONO ABS: 0.3 10*3/uL (ref 0.1–1.0)
MONOS PCT: 6.6 % (ref 3.0–12.0)
NEUTROS PCT: 67 % (ref 43.0–77.0)
Neutro Abs: 3.4 10*3/uL (ref 1.4–7.7)
PLATELETS: 246 10*3/uL (ref 150.0–400.0)
RBC: 4.18 Mil/uL (ref 3.87–5.11)
RDW: 13.3 % (ref 11.5–15.5)
WBC: 5.1 10*3/uL (ref 4.0–10.5)

## 2015-06-17 LAB — BASIC METABOLIC PANEL
BUN: 14 mg/dL (ref 6–23)
CO2: 28 mEq/L (ref 19–32)
Calcium: 9.3 mg/dL (ref 8.4–10.5)
Chloride: 105 mEq/L (ref 96–112)
Creatinine, Ser: 0.96 mg/dL (ref 0.40–1.20)
GFR: 61.37 mL/min (ref >60.00)
GLUCOSE: 95 mg/dL (ref 70–99)
POTASSIUM: 4.4 meq/L (ref 3.5–5.1)
SODIUM: 139 meq/L (ref 135–145)

## 2015-06-17 LAB — HEPATIC FUNCTION PANEL
ALK PHOS: 55 U/L (ref 39–117)
ALT: 12 U/L (ref 0–35)
AST: 15 U/L (ref 0–37)
Albumin: 4.4 g/dL (ref 3.5–5.2)
BILIRUBIN DIRECT: 0 mg/dL (ref 0.0–0.3)
BILIRUBIN TOTAL: 0.5 mg/dL (ref 0.2–1.2)
Total Protein: 7 g/dL (ref 6.0–8.3)

## 2015-06-17 LAB — LIPID PANEL
CHOL/HDL RATIO: 3
Cholesterol: 205 mg/dL — ABNORMAL HIGH (ref 0–200)
HDL: 68.1 mg/dL (ref >39.00)
LDL CALC: 125 mg/dL — AB (ref 0–99)
NonHDL: 136.9
TRIGLYCERIDES: 60 mg/dL (ref 0.0–149.0)
VLDL: 12 mg/dL (ref 0.0–40.0)

## 2015-06-17 LAB — VITAMIN D 25 HYDROXY (VIT D DEFICIENCY, FRACTURES): VITD: 22.01 ng/mL — ABNORMAL LOW (ref 30.00–100.00)

## 2015-06-24 ENCOUNTER — Other Ambulatory Visit: Payer: Self-pay | Admitting: Emergency Medicine

## 2015-06-24 ENCOUNTER — Encounter: Payer: Self-pay | Admitting: Internal Medicine

## 2015-06-24 ENCOUNTER — Ambulatory Visit (INDEPENDENT_AMBULATORY_CARE_PROVIDER_SITE_OTHER): Payer: BLUE CROSS/BLUE SHIELD | Admitting: Internal Medicine

## 2015-06-24 VITALS — BP 124/84 | HR 64 | Temp 97.7°F | Resp 16 | Ht 64.5 in | Wt 124.0 lb

## 2015-06-24 DIAGNOSIS — M5412 Radiculopathy, cervical region: Secondary | ICD-10-CM

## 2015-06-24 DIAGNOSIS — Z0000 Encounter for general adult medical examination without abnormal findings: Secondary | ICD-10-CM

## 2015-06-24 DIAGNOSIS — R109 Unspecified abdominal pain: Secondary | ICD-10-CM

## 2015-06-24 MED ORDER — ELETRIPTAN HYDROBROMIDE 20 MG PO TABS: ORAL_TABLET | ORAL | Status: DC

## 2015-06-24 MED ORDER — TRAZODONE HCL 50 MG PO TABS: 150.0000 mg | ORAL_TABLET | Freq: Every day | ORAL | Status: DC

## 2015-06-24 NOTE — Progress Notes (Signed)
   Subjective:    Patient ID: [name], female    DOB: Jan 23, 1946, 69 y.o.   MRN: [id number]  HPI She is here for a physical;acute issues include recurrent lower abdominal pain.  Since January of this year she's had 3 episodes of lower quadrant abdominal pain, greater on the left than the right. Typically this will last 2-3 days and be associated with low-grade fever and loose stools. She has never had a colonoscopy although the standard of care has been discussed and such has been recommended. These episodes have not been treated specifically. She has had  no other GI symptoms. Her CBC was normal on 6/29. Her mother had history of ulcers.  She woke up earlier this week with pain in the neck radiating down to the right shoulder and upper extremity. There is no associated neuromuscular deficit. She has gabapentin which she takes as needed. She has not taken it for the neck/right upper extremity pain.  She is on a heart healthy low-salt diet. With diet and exercise she has decreased her LDL from 157 to 125. She has never taken a statin. She is in a fitness class 2 times a week and also walks almost every other day without cardio pulmonary symptoms. She does not monitor blood pressure but it has been normal on all doctor visits.   Review of Systems  Chest pain, palpitations, tachycardia, exertional dyspnea, paroxysmal nocturnal dyspnea, claudication or edema are absent. No unexplained weight loss, significant dyspepsia, dysphagia, melena, rectal bleeding, or persistently small caliber stools. Dysuria, pyuria, hematuria, frequency, nocturia or polyuria are denied. Change in hair, skin, nails denied. No bowel changes of constipation or diarrhea. No intolerance to heat or cold.      Objective:   Physical Exam  Pertinent or positive findings include: Wax is present on the right greater than the left in her otic canals. She has some ptosis bilaterally. She has decreased range of motion of the  cervical spine with some discomfort in the right upper extremity and shoulder with right lateral neck rotation. The aorta is palpable without aneurysm. She has slight crepitus of her knees.  General appearance :adequately nourished; in no distress.  Eyes: No conjunctival inflammation or scleral icterus is present.  Oral exam:  Lips and gums are healthy appearing.There is no oropharyngeal erythema or exudate noted. Dental hygiene is good.  Heart:  Normal rate and regular rhythm. S1 and S2 normal without gallop, murmur, click, rub or other extra sounds    Lungs:Chest clear to auscultation; no wheezes, rhonchi,rales ,or rubs present.No increased work of breathing.   Abdomen: bowel sounds normal, soft and non-tender without masses, organomegaly or hernias noted.  No guarding or rebound.   Vascular : all pulses equal ; no bruits present.  Skin:Warm & dry.  Intact without suspicious lesions or rashes ; no tenting or jaundice   Lymphatic: No lymphadenopathy is noted about the head, neck, axilla  Neuro: Strength, tone & DTRs normal.         Assessment & Plan:  #1 comprehensive physical exam; no acute findings  #2 recurrent lower quadrant abdominal pain suggestive of intermittent diverticulitis. No colonoscopy to date despite repeated discussions of the standard of care.  #3 acute C5 radiculopathy.  Plan: see Orders  & Recommendations. Referral will be made to GI as prelude to colonoscopy

## 2015-06-24 NOTE — Progress Notes (Signed)
Pre visit review using our clinic review tool, if applicable. No additional management support is needed unless otherwise documented below in the visit note. 

## 2015-06-24 NOTE — Patient Instructions (Signed)
The GI referral will be scheduled and you'll be notified of the time.Please call the Referral Co-Ordinator @ [phone] if you have not been notified of appointment time within 7-10 days.  Use a cervical memory foam pillow to prevent hyperextension or hyperflexion of the cervical spine.Assess response to the gabapentin one every 8 hours as needed. If it is partially beneficial, it can be increased up to a total of 3 pills every 8 hours as needed. This increase of 1 pill each dose  should take place over 72 hours at least.

## 2015-07-02 ENCOUNTER — Telehealth: Payer: Self-pay | Admitting: Internal Medicine

## 2015-07-02 ENCOUNTER — Other Ambulatory Visit: Payer: Self-pay | Admitting: Emergency Medicine

## 2015-07-02 ENCOUNTER — Encounter: Payer: Self-pay | Admitting: Internal Medicine

## 2015-07-02 MED ORDER — BENZONATATE 200 MG PO CAPS: 200.0000 mg | ORAL_CAPSULE | Freq: Three times a day (TID) | ORAL | Status: DC | PRN

## 2015-07-02 MED ORDER — AZITHROMYCIN 1 G PO PACK: 1.0000 g | PACK | Freq: Once | ORAL | Status: DC

## 2015-07-02 NOTE — Telephone Encounter (Signed)
zpack  And Tessalon 200 mg tid prn #15

## 2015-07-02 NOTE — Telephone Encounter (Signed)
Please advise 

## 2015-07-02 NOTE — Telephone Encounter (Signed)
Pt has a deep cough and she is out of town and wanted to know if Dr Donivan Scull could call her in some cough meds?  She has not been seen for this issue.  If he is able to, please sent to pharmacy below.   [facility] in [phone]   Best number for pt [phone]

## 2015-07-03 ENCOUNTER — Other Ambulatory Visit: Payer: Self-pay | Admitting: Emergency Medicine

## 2015-07-03 MED ORDER — AZITHROMYCIN 250 MG PO TABS: ORAL_TABLET | ORAL | Status: DC

## 2015-07-31 ENCOUNTER — Other Ambulatory Visit: Payer: Self-pay | Admitting: Internal Medicine

## 2015-07-31 NOTE — Telephone Encounter (Signed)
Please advise, refill sent 07/02/15

## 2015-07-31 NOTE — Telephone Encounter (Signed)
OK X1 

## 2015-08-03 ENCOUNTER — Other Ambulatory Visit: Payer: Self-pay | Admitting: Emergency Medicine

## 2015-08-03 MED ORDER — BENZONATATE 200 MG PO CAPS: 200.0000 mg | ORAL_CAPSULE | Freq: Three times a day (TID) | ORAL | Status: DC | PRN

## 2015-09-01 ENCOUNTER — Encounter: Payer: Self-pay | Admitting: Internal Medicine

## 2015-09-01 ENCOUNTER — Other Ambulatory Visit (INDEPENDENT_AMBULATORY_CARE_PROVIDER_SITE_OTHER): Payer: BLUE CROSS/BLUE SHIELD

## 2015-09-01 ENCOUNTER — Ambulatory Visit (INDEPENDENT_AMBULATORY_CARE_PROVIDER_SITE_OTHER)
Admission: RE | Admit: 2015-09-01 | Discharge: 2015-09-01 | Disposition: A | Discharge status: Home / Self Care | Payer: BLUE CROSS/BLUE SHIELD | Source: Ambulatory Visit | Attending: Internal Medicine | Admitting: Internal Medicine

## 2015-09-01 ENCOUNTER — Ambulatory Visit (INDEPENDENT_AMBULATORY_CARE_PROVIDER_SITE_OTHER): Payer: BLUE CROSS/BLUE SHIELD | Admitting: Internal Medicine

## 2015-09-01 VITALS — BP 132/82 | HR 67 | Temp 98.1°F | Resp 16 | Wt 126.0 lb

## 2015-09-01 DIAGNOSIS — J209 Acute bronchitis, unspecified: Secondary | ICD-10-CM

## 2015-09-01 DIAGNOSIS — R0689 Other abnormalities of breathing: Secondary | ICD-10-CM

## 2015-09-01 DIAGNOSIS — R0989 Other specified symptoms and signs involving the circulatory and respiratory systems: Secondary | ICD-10-CM

## 2015-09-01 LAB — CBC WITH DIFFERENTIAL/PLATELET
BASOS PCT: 0.6 % (ref 0.0–3.0)
Basophils Absolute: 0 10*3/uL (ref 0.0–0.1)
EOS PCT: 1 % (ref 0.0–5.0)
Eosinophils Absolute: 0.1 10*3/uL (ref 0.0–0.7)
HCT: 37.6 % (ref 36.0–46.0)
Hemoglobin: 12.7 g/dL (ref 12.0–15.0)
LYMPHS ABS: 1.7 10*3/uL (ref 0.7–4.0)
Lymphocytes Relative: 30.2 % (ref 12.0–46.0)
MCHC: 33.8 g/dL (ref 30.0–36.0)
MCV: 91.4 fl (ref 78.0–100.0)
MONOS PCT: 5.1 % (ref 3.0–12.0)
Monocytes Absolute: 0.3 10*3/uL (ref 0.1–1.0)
NEUTROS ABS: 3.5 10*3/uL (ref 1.4–7.7)
NEUTROS PCT: 63.1 % (ref 43.0–77.0)
PLATELETS: 239 10*3/uL (ref 150.0–400.0)
RBC: 4.12 Mil/uL (ref 3.87–5.11)
RDW: 13.6 % (ref 11.5–15.5)
WBC: 5.5 10*3/uL (ref 4.0–10.5)

## 2015-09-01 MED ORDER — PREDNISONE 10 MG PO TABS: ORAL_TABLET | ORAL | Status: DC

## 2015-09-01 NOTE — Patient Instructions (Addendum)
Use Breo once daily: Pull cap down to release medication. Blow out as much as possible then inhale powder as deeply as possible. Hold breath to count of ten then exhale.  Gargle and spit after use. Lot #: [id number] Expiration date: 01/2017  Plain Mucinex (NOT D) for thick secretions ;force NON dairy fluids .   Nasal cleansing in the shower as discussed with lather of mild shampoo.After 10 seconds wash off lather while  exhaling through nostrils. Make sure that all residual soap is removed to prevent irritation.  Flonase OR Nasacort AQ 1 spray in each nostril twice a day as needed. Use the "crossover" technique into opposite nostril spraying toward opposite ear @ 45 degree angle, not straight up into nostril.  Plain Allegra (NOT D )  160 daily , Loratidine 10 mg , OR Zyrtec 10 mg @ bedtime  as needed for itchy eyes & sneezing.  Carry room temperature water and sip liberally after coughing.

## 2015-09-01 NOTE — Progress Notes (Signed)
Pre visit review using our clinic review tool, if applicable. No additional management support is needed unless otherwise documented below in the visit note. 

## 2015-09-01 NOTE — Progress Notes (Signed)
   Subjective:    Patient ID: [name], female    DOB: 1946-11-18, 69 y.o.   MRN: [id number]  HPI She has had a cough since mid July. Initially she thought it was a "virus". She took a Z-Pak for fever, head congestion, & sore throat. The symptoms did improve. She's continued to have the cough and chest tightness. She can produce sputum but has not visualized it, simply swallowing it. She states every few days she'll have a low-grade fever up to 99. She has some associated wheezing & also she has postnasal drainage.   She denies a history of asthma.  She has no significant reflux symptoms.  Review of Systems Frontal headache, facial pain , nasal purulence, dental pain, sore throat , otic pain or otic discharge denied @ this time. No fever , chills or sweats. Unexplained weight loss, abdominal pain, significant dyspepsia, dysphagia, melena, rectal bleeding, or persistently small caliber stools are denied.     Objective:   Physical Exam There is marked erythema of the nasal mucosa. She has wax in both otic canals. The tympanic membranes were not visualized. Initially she had rales in the right lower lobe which resolved with deep breathing. She has a harsh barking, nonproductive cough. No increased work of breathing present.  General appearance:Adequately nourished; no acute distress or increased work of breathing is present.    Lymphatic: No  lymphadenopathy about the head, neck, or axilla .  Eyes: No conjunctival inflammation or lid edema is present. There is no scleral icterus.  Ears:  External ear exam shows no significant lesions or deformities.    Nose:  External nasal examination shows no deformity or inflammation. No septal dislocation or deviation.No obstruction to airflow.   Oral exam: Dental hygiene is good; lips and gums are healthy appearing.There is no oropharyngeal erythema or exudate .  Neck:  No deformities, thyromegaly, masses, or tenderness noted.   Supple with  full range of motion without pain.   Heart:  Normal rate and regular rhythm. S1 and S2 normal without gallop, murmur, click, rub or other extra sounds.    Extremities:  No cyanosis, edema, or clubbing  noted    Skin: Warm & dry w/o tenting or jaundice. No significant lesions or rash.       Assessment & Plan:  #1 acute bronchitis w/o bronchospasm but  abnormal breath sounds suggested in the right lower lobe. Rule out community-acquired pneumonia, atypical. #2 nonallergic rhinitis Plan: See orders and recommendations

## 2015-09-04 ENCOUNTER — Ambulatory Visit: Payer: BLUE CROSS/BLUE SHIELD | Admitting: Internal Medicine

## 2015-09-16 ENCOUNTER — Other Ambulatory Visit: Payer: Self-pay | Admitting: General Surgery

## 2015-09-16 DIAGNOSIS — Z853 Personal history of malignant neoplasm of breast: Secondary | ICD-10-CM

## 2015-10-05 ENCOUNTER — Ambulatory Visit: Payer: BLUE CROSS/BLUE SHIELD | Admitting: Internal Medicine

## 2015-10-19 ENCOUNTER — Other Ambulatory Visit: Payer: Self-pay | Admitting: Internal Medicine

## 2015-10-19 ENCOUNTER — Telehealth: Payer: Self-pay | Admitting: Internal Medicine

## 2015-10-19 DIAGNOSIS — R059 Cough, unspecified: Secondary | ICD-10-CM

## 2015-10-19 DIAGNOSIS — R05 Cough: Secondary | ICD-10-CM

## 2015-10-19 NOTE — Telephone Encounter (Signed)
Patient states she was seen 09/01/15 and still has a cough, especially when she lays down at night. "Feel like I'm breathing through mud." She would like to know what she should do. She uses [facility] on [address] CB# [phone]

## 2015-10-19 NOTE — Telephone Encounter (Signed)
Pulmonary evaluation by Dr Melvyn Novas needed. He is expert on chronic cough

## 2015-10-19 NOTE — Telephone Encounter (Signed)
LVM informing pt

## 2015-10-21 ENCOUNTER — Ambulatory Visit
Admission: RE | Admit: 2015-10-21 | Discharge: 2015-10-21 | Disposition: A | Discharge status: Home / Self Care | Payer: BLUE CROSS/BLUE SHIELD | Source: Ambulatory Visit | Attending: General Surgery | Admitting: General Surgery

## 2015-10-21 DIAGNOSIS — Z853 Personal history of malignant neoplasm of breast: Secondary | ICD-10-CM

## 2015-10-29 ENCOUNTER — Encounter: Payer: Self-pay | Admitting: Internal Medicine

## 2015-10-29 ENCOUNTER — Ambulatory Visit (INDEPENDENT_AMBULATORY_CARE_PROVIDER_SITE_OTHER): Payer: BLUE CROSS/BLUE SHIELD | Admitting: Internal Medicine

## 2015-10-29 VITALS — BP 108/78 | HR 71 | Ht 64.5 in | Wt 126.8 lb

## 2015-10-29 DIAGNOSIS — R05 Cough: Secondary | ICD-10-CM | POA: Diagnosis not present

## 2015-10-29 DIAGNOSIS — R058 Other specified cough: Secondary | ICD-10-CM

## 2015-10-29 MED ORDER — PANTOPRAZOLE SODIUM 40 MG PO TBEC: DELAYED_RELEASE_TABLET | ORAL | Status: DC

## 2015-10-29 MED ORDER — BENZONATATE 200 MG PO CAPS: ORAL_CAPSULE | ORAL | Status: DC

## 2015-10-29 NOTE — Assessment & Plan Note (Signed)
The most common causes of chronic cough in immunocompetent adults include the following: upper airway cough syndrome (UACS), previously referred to as postnasal drip syndrome (PNDS), which is caused by variety of rhinosinus conditions; (2) asthma; (3) GERD; (4) chronic bronchitis from cigarette smoking or other inhaled environmental irritants; (5) nonasthmatic eosinophilic bronchitis; and (6) bronchiectasis.   These conditions, singly or in combination, have accounted for up to 94% of the causes of chronic cough in prospective studies.   Other conditions have constituted no >6% of the causes in prospective studies These have included bronchogenic carcinoma, chronic interstitial pneumonia, sarcoidosis, left ventricular failure, ACEI-induced cough, and aspiration from a condition associated with pharyngeal dysfunction.    Chronic cough is often simultaneously caused by more than one condition. A single cause has been found from 38 to 82% of the time, multiple causes from 18 to 62%. Multiply caused cough has been the result of three diseases up to 42% of the time.       Based on hx and exam, this is most likely:  Classic Upper airway cough syndrome, so named because it's frequently impossible to sort out how much is  CR/sinusitis with freq throat clearing (which can be related to primary GERD)   vs  causing  secondary (" extra esophageal")  GERD from wide swings in gastric pressure that occur with throat clearing, often  promoting self use of mint and menthol lozenges that reduce the lower esophageal sphincter tone and exacerbate the problem further in a cyclical fashion.   These are the same pts (now being labeled as having "irritable larynx syndrome" by some cough centers) who not infrequently have a history of having failed to tolerate ace inhibitors,  dry powder inhalers or biphosphonates or report having atypical reflux symptoms that don't respond to standard doses of PPI , and are easily confused as  having aecopd or asthma flares by even experienced allergists/ pulmonologists.   The first step is to maximize acid suppression and eliminate cyclical coughing then regroup if the cough persists.  I had an extended discussion with the patient reviewing all relevant studies completed to date and  lasting 35/60 min ov  1) Explained:   Explained the natural history of uri and why it's necessary in patients at risk to treat GERD aggressively - at least  short term -   to reduce risk of evolving cyclical cough initially  triggered by epithelial injury and a heightened sensitivty to the effects of any upper airway irritants,  most importantly acid - related - then perpetuated by epithelial injury related to the cough itself as the upper airway collapses on itself.  That is, the more sensitive the epithelium becomes once it is damaged by the virus, the more the ensuing irritability> the more the cough, the more the secondary reflux (especially in those prone to reflux) the more the irritation of the sensitive mucosa and so on in a  Classic cyclical pattern.    2) Each maintenance medication was reviewed in detail including most importantly the difference between maintenance and as needed and under what circumstances the prns are to be used.  Please see instructions for details which were reviewed in writing and the patient given a copy.  Marland Kitchen       Marland Kitchen

## 2015-10-29 NOTE — Progress Notes (Signed)
Subjective:     Patient ID: [name], female   DOB: Aug 08, 1946,    MRN: [id number]  HPI   38 yowf quit smoking 1970s but never heavy smoker referred by Dr Linna Darner to pulmonary clinic 10/29/2015 with incessant harsh upper airway coughing fits despite rx as follows: Breo  Mucinex (NOT D) for thick secretions ;force NON dairy fluids .  Nasal cleansing in the shower as discussed with lather of mild shampoo.After 10 seconds wash off lather while exhaling through nostrils. Make sure that all residual soap is removed to prevent irritation.  Flonase OR Nasacort AQ 1 spray in each nostril twice a day as needed. Use the "crossover" technique into opposite nostril spraying toward opposite ear @ 45 degree angle, not straight up into nostril.  Plain Allegra (NOT D ) 160 daily , Loratidine 10 mg , OR Zyrtec 10 mg @ bedtime as needed for itchy eyes & sneezing.   10/29/2015 1st [facility] office visit/ Surie Suchocki   Chief Complaint  Patient presents with  . Pulmonary Consult    Pt c/o wet cough with clear mucus, increased SOB that is worse when laying down, some wheeze and tightness on occasion. Pt denies CP.   Onset cough was acute with viral syndrome  In July 2016 with fever/ aches all over> all symptoms  resolved x for cough- was also at night now mostly daytime and non productive  best rx = [facility] Says feels if she could just cough it all up she'd be fine but actually hasn't coughed anything up at all / only sob with cough/ sensation of pnds but no excessive nasal discharge   No obvious other patterns in day to day or daytime variabilty or assoccp or overt sinus or hb symptoms. No unusual exp hx or h/o childhood pna/ asthma or knowledge of premature birth.  Sleeping ok without nocturnal  or early am exacerbation  of respiratory  c/o's or need for noct saba. Also denies any obvious fluctuation of symptoms with weather or environmental changes or other aggravating or alleviating factors  except as outlined above   Current Medications, Allergies, Complete Past Medical History, Past Surgical History, Family History, and Social History were reviewed in [employer] record.         Review of Systems  Constitutional: Negative.  Negative for fever and unexpected weight change.  HENT: Positive for postnasal drip and rhinorrhea. Negative for congestion, dental problem, ear pain, nosebleeds, sinus pressure, sneezing, sore throat and trouble swallowing.   Eyes: Negative.  Negative for redness and itching.  Respiratory: Positive for cough and shortness of breath. Negative for chest tightness and wheezing.   Cardiovascular: Negative.  Negative for palpitations and leg swelling.  Gastrointestinal: Negative.  Negative for nausea and vomiting.  Endocrine: Negative.   Genitourinary: Negative.  Negative for dysuria.  Musculoskeletal: Negative.  Negative for joint swelling.  Skin: Negative.  Negative for rash.  Allergic/Immunologic: Negative.   Neurological: Negative.  Negative for headaches.  Hematological: Negative.  Does not bruise/bleed easily.  Psychiatric/Behavioral: Negative.  Negative for dysphoric mood. The patient is not nervous/anxious.        Objective:   Physical Exam    Extremely harsh /shrill very loud cough heard across the office with the door shut and yet pt says "I don't know why I'm here"   Wt Readings from Last 3 Encounters:  10/29/15 126 lb 12.8 oz (57.516 kg)  09/01/15 126 lb (57.153 kg)  06/24/15 124 lb (56.246 kg)  Vital signs reviewed   HEENT: nl dentition, turbinates, and oropharynx which is pristine . Nl external ear canals without cough reflex   NECK :  without JVD/Nodes/TM/ nl carotid upstrokes bilaterally   LUNGS: no acc muscle use, clear to A and P bilaterally without cough on insp or exp maneuvers   CV:  RRR  no s3 or murmur or increase in P2, no edema   ABD:  soft and nontender with nl excursion in the supine  position. No bruits or organomegaly, bowel sounds nl  MS:  warm without deformities, calf tenderness, cyanosis or clubbing  SKIN: warm and dry without lesions    NEURO:  alert, approp, no deficits      I personally reviewed images and agree with radiology impression as follows:  CXR:  09/01/15 No active cardiopulmonary disease     Assessment:

## 2015-10-29 NOTE — Patient Instructions (Addendum)
Pantoprazole (protonix) 40 mg   Take  30-60 min before first meal of the day and Pepcid (famotidine)  20 mg one @  bedtime until return to office - this is the best way to tell whether stomach acid is contributing to your problem.    For drainage / throat tickle try take CHLORPHENIRAMINE  4 mg - take one every 4 hours as needed - available over the counter- may cause drowsiness so start with just a bedtime dose or two and see how you tolerate it before trying in daytime     GERD (REFLUX)  is an extremely common cause of respiratory symptoms just like yours , many times with no obvious heartburn at all.    It can be treated with medication, but also with lifestyle changes including elevation of the head of your bed (ideally with 6 inch  bed blocks),  Smoking cessation, avoidance of late meals, excessive alcohol, and avoid fatty foods, chocolate, peppermint, colas, red wine, and acidic juices such as orange juice.  NO MINT OR MENTHOL PRODUCTS SO NO COUGH DROPS  USE SUGARLESS CANDY INSTEAD (Jolley ranchers or Stover's or Life Savers) or even ice chips will also do - the key is to swallow to prevent all throat clearing. NO OIL BASED VITAMINS - use powdered substitutes.    Use tessilon 200 mg every 4 hours while away  until not coughing any more at all x 5 days then wean off   Return in 2 weeks if not 100% better

## 2015-11-03 ENCOUNTER — Institutional Professional Consult (permissible substitution): Payer: BLUE CROSS/BLUE SHIELD | Admitting: Internal Medicine

## 2015-11-16 ENCOUNTER — Ambulatory Visit: Payer: BLUE CROSS/BLUE SHIELD | Admitting: Internal Medicine

## 2015-12-10 ENCOUNTER — Telehealth: Payer: Self-pay

## 2015-12-10 NOTE — Telephone Encounter (Signed)
PA initiated via covermymeds. Key for PA is [id number]. The PA was approved.

## 2015-12-20 HISTORY — PX: CATARACT EXTRACTION, BILATERAL: SHX1313

## 2015-12-30 ENCOUNTER — Ambulatory Visit (INDEPENDENT_AMBULATORY_CARE_PROVIDER_SITE_OTHER): Payer: BLUE CROSS/BLUE SHIELD | Admitting: Internal Medicine

## 2015-12-30 ENCOUNTER — Encounter: Payer: Self-pay | Admitting: Internal Medicine

## 2015-12-30 VITALS — BP 124/78 | HR 69 | Temp 97.9°F | Resp 16 | Wt 125.0 lb

## 2015-12-30 DIAGNOSIS — R058 Other specified cough: Secondary | ICD-10-CM

## 2015-12-30 DIAGNOSIS — M810 Age-related osteoporosis without current pathological fracture: Secondary | ICD-10-CM | POA: Diagnosis not present

## 2015-12-30 DIAGNOSIS — G43909 Migraine, unspecified, not intractable, without status migrainosus: Secondary | ICD-10-CM

## 2015-12-30 DIAGNOSIS — R05 Cough: Secondary | ICD-10-CM | POA: Diagnosis not present

## 2015-12-30 DIAGNOSIS — M4806 Spinal stenosis, lumbar region: Secondary | ICD-10-CM | POA: Diagnosis not present

## 2015-12-30 DIAGNOSIS — M797 Fibromyalgia: Secondary | ICD-10-CM

## 2015-12-30 DIAGNOSIS — M48061 Spinal stenosis, lumbar region without neurogenic claudication: Secondary | ICD-10-CM

## 2015-12-30 NOTE — Assessment & Plan Note (Signed)
Yesterday daily minimal symptoms  takes gabapentin as needed for more severe symptoms

## 2015-12-30 NOTE — Progress Notes (Signed)
Subjective:    Patient ID: [name], female    DOB: 07-14-1946, 70 y.o.   MRN: [id number]  HPI She is here to establish with a new pcp.   Cough: Since July she has had a cough.  She had a severe URI and it started with that.  She tends to cough with talking and sometimes eating.  She feels like it is irritation in her throat.  She uses tessalon as needed and that works. She saw Dr Melvyn Novas and was placed on protonix - he believed she had GERD.  There was no imporvement so she stopped the medication. The cough was severe and has gotten much better.  She denies any cold symptoms, sob, wheeze.  She wonders if at times she has more of a struggle to breath, but she denies sob.  She denies GERD, allergies and there is no trigger of the cough from weather or temperature changes.  No previous history of cough since the URI in the summer.  She is able to bring up some white phlegm with the cough.    Migraines headaches:  She gets migraines maybe 2-3 / year. Relpax works well and she denies side effects.    Spinal stenosis:  She takes gabapenin as needed - it causes headaches so she tries not to take it   She gets lower back pain it can go down her legs at times.  She aways has some degree of pain, but she can manage it.   It sometimes is more severe.  She is exercising regularly.        Op:  She did not tolerate a bisphosphonate in the past (fosamax or actonel or boniva - she does not recall).  She walks daily and exercises.  She eats well.  She does not take any supplements.    Fibromyalgia:  She was diagnosed years ago and it is ok now.     Medications and allergies reviewed with patient and updated if appropriate.  Patient Active Problem List   Diagnosis Date Noted  . Upper airway cough syndrome 10/29/2015  . Osteoporosis 06/06/2011  . Vitamin D deficiency 03/22/2010  . BREAST CANCER, HX OF 03/22/2010  . SPINAL STENOSIS, LUMBAR 03/25/2008  . HYPERLIPIDEMIA 09/28/2007  . LOW BACK PAIN  05/24/2007  . FIBROMYALGIA 05/24/2007    Current Outpatient Prescriptions on File Prior to Visit  Medication Sig Dispense Refill  . benzonatate (TESSALON) 200 MG capsule One four times daily as needed for cough 40 capsule 2  . eletriptan (RELPAX) 20 MG tablet TAKE 1 TABLET BY MOUTH AT ONSET OF HEADACHE. MAY REPEAT 1 TABLET IN 2 HOURS IF HEADACHE PERSISTS OR RECURS 9 tablet 0  . gabapentin (NEURONTIN) 100 MG capsule Take 1 capsule (100 mg total) by mouth 3 (three) times daily as needed. 30 capsule 3  . pantoprazole (PROTONIX) 40 MG tablet Take 1 tablet 30-60 minutes before first meal of the day 30 tablet 0  . traZODone (DESYREL) 50 MG tablet Take 3 tablets (150 mg total) by mouth at bedtime. 270 tablet 3   No current facility-administered medications on file prior to visit.    Past Medical History  Diagnosis Date  . Arthritis   . Bronchitis     recurrent  . Pneumonia     as OP X 2; no Pneumovax  . Fibromyalgia   . Vitamin D deficiency     18 in  01/2009  . Hyperlipemia   . Chronic low back  pain     bulging disc & spinal stenosis  . Breast cancer ([place])     R lumpectomy ; radiation; no chemotherapy  . Osteoporosis     Past Surgical History  Procedure Laterality Date  . Cesarean section       G 2 P 2  . Abdominal hysterectomy  1990    USO for fibroid , Dr Maryruth Eve  . Cystectomy  2010  . Breast surgery  2010    R side lumpectomy; Dr Margot Chimes  . No colonoscopy      "I just don't want to do it". SOC reviewed    Social History   Social History  . Marital Status: Married    Spouse Name: N/A  . Number of Children: N/A  . Years of Education: N/A   Social History Main Topics  . Smoking status: Former Smoker    Quit date: 12/19/1970  . Smokeless tobacco: Not on file     Comment: [phone], up to 3 cigarettes / day  . Alcohol Use: No  . Drug Use: No  . Sexual Activity: Not on file   Other Topics Concern  . Not on file   Social History Narrative    Family History    Problem Relation Age of Onset  . Atrial fibrillation Mother     S/P cardioversion  . Ulcers Mother   . Hypertension Mother   . Heart attack Father 48    died @ 34  . Heart attack Paternal Aunt     < 61  . Heart attack Paternal Grandfather     > 55  . Heart attack Paternal Uncle     > 55  . Heart attack Paternal Grandmother     < 98  . Hypertension Brother   . Thyroid disease Daughter     on Synthroid since high school  . Diabetes Brother   . Stroke Neg Hx     Review of Systems  Constitutional: Negative for fever and chills.  Respiratory: Positive for cough. Negative for chest tightness, shortness of breath and wheezing.   Cardiovascular: Negative for chest pain, palpitations and leg swelling.  Gastrointestinal:       No GERD  Neurological: Positive for headaches (occ migraines). Negative for dizziness and light-headedness.       Objective:   Filed Vitals:   12/30/15 1042  BP: 124/78  Pulse: 69  Temp: 97.9 F (36.6 C)  Resp: 16   Filed Weights   12/30/15 1042  Weight: 125 lb (56.7 kg)   Body mass index is 21.13 kg/(m^2).   Physical Exam Constitutional: Appears well-developed and well-nourished. No distress.  Neck: Neck supple. No tracheal deviation present. No thyromegaly present.  No carotid bruit. No cervical adenopathy.   Cardiovascular: Normal rate, regular rhythm and normal heart sounds.   No murmur heard.  No edema Pulmonary/Chest: Effort normal and breath sounds normal. No respiratory distress. No wheezes.       Assessment & Plan:

## 2015-12-30 NOTE — Assessment & Plan Note (Signed)
No improvement with protonix - so she stopped it ? Throat irritation vs neurogenic  Consider ENT eval - she will let me know Continue tessalon as needed Improving and hopefully will continue to improve

## 2015-12-30 NOTE — Assessment & Plan Note (Signed)
Diagnosed years ago Symptoms manageable Exercises regularly

## 2015-12-30 NOTE — Assessment & Plan Note (Signed)
Bone density improved-last density 2014 Exercising regularly Not currently taking any supplements Has not tolerated bisphosphonates in the past

## 2015-12-30 NOTE — Patient Instructions (Addendum)
  We have reviewed your prior records including labs and tests today.   Medications reviewed and updated.  No changes recommended at this time.  Please followup annually for a physical exam.

## 2015-12-30 NOTE — Assessment & Plan Note (Signed)
Infrequent relpax as needed

## 2015-12-30 NOTE — Progress Notes (Signed)
Pre visit review using our clinic review tool, if applicable. No additional management support is needed unless otherwise documented below in the visit note. 

## 2016-04-15 ENCOUNTER — Telehealth: Payer: Self-pay | Admitting: Internal Medicine

## 2016-04-15 NOTE — Telephone Encounter (Signed)
Patient left message on Triage requesting a Breo rx.

## 2016-04-15 NOTE — Telephone Encounter (Signed)
[facility]  Patient Name: [name]  DOB: 07/18/1946    Initial Comment Caller states c/o difficulty breathing, feels like she isn't getting enough o2   Nurse Assessment  Nurse: Wynetta Emery, RN, Baker Janus Date/Time (Eastern Time): 04/15/2016 10:37:18 AM  Confirm and document reason for call. If symptomatic, describe symptoms. You must click the next button to save text entered. ---Lalonnie states she is short of breath can't get enough air in getting worse over last two weeks states has had a cough since last July  Has the patient traveled out of the country within the last 30 days? ---No  Does the patient have any new or worsening symptoms? ---Yes  Will a triage be completed? ---Yes  Related visit to physician within the last 2 weeks? ---No  Does the PT have any chronic conditions? (i.e. diabetes, asthma, etc.) ---No  Is this a behavioral health or substance abuse call? ---No     Guidelines    Guideline Title Affirmed Question Affirmed Notes  Cough - Acute Productive Cough present > 10 days    Final Disposition User   See PCP When Office is Open (within 3 days) Wynetta Emery, Therapist, sports, Baker Janus    Comments  NOTE:: appoint not made she wants to come in Tuesday but no availabilty will call first of week for appt states she has to leave for another appt at this time.  NOTE states she is having slight shortness of breath that she notices but she does not needs to be seen until sometime next week; she is going out of country and wants to be sure it is not something serious.   Disagree/Comply: Comply

## 2016-04-16 MED ORDER — FLUTICASONE FUROATE-VILANTEROL 100-25 MCG/INH IN AEPB
1.0000 | INHALATION_SPRAY | Freq: Every day | RESPIRATORY_TRACT | Status: DC
Start: 2016-04-16 — End: 2018-01-24

## 2016-04-16 NOTE — Telephone Encounter (Signed)
Breo sent to [facility] - call her and see how she is doing - may need appt

## 2016-04-18 NOTE — Telephone Encounter (Signed)
Spoke with pt. She stated that she has an appt with Dr Quay Burow on Wed. Advised her to keep that appt and to pick-up the RX from [facility].

## 2016-04-20 ENCOUNTER — Ambulatory Visit (INDEPENDENT_AMBULATORY_CARE_PROVIDER_SITE_OTHER): Payer: BLUE CROSS/BLUE SHIELD | Admitting: Internal Medicine

## 2016-04-20 ENCOUNTER — Encounter: Payer: Self-pay | Admitting: Internal Medicine

## 2016-04-20 VITALS — BP 126/80 | HR 78 | Temp 98.7°F | Resp 16 | Wt 125.0 lb

## 2016-04-20 DIAGNOSIS — R059 Cough, unspecified: Secondary | ICD-10-CM

## 2016-04-20 DIAGNOSIS — R05 Cough: Secondary | ICD-10-CM | POA: Diagnosis not present

## 2016-04-20 DIAGNOSIS — R058 Other specified cough: Secondary | ICD-10-CM

## 2016-04-20 MED ORDER — AZITHROMYCIN 250 MG PO TABS: ORAL_TABLET | ORAL | Status: DC

## 2016-04-20 NOTE — Progress Notes (Signed)
Subjective:    Patient ID: [name], female    DOB: 10-16-1946, 70 y.o.   MRN: [id number]  HPI She is here for an acute visit for cough and difficulty getting a deep breath.   She is still having the cough.  It is chronic and started last July after a URI.  She has seen pulmonary.  It has gotten better, but is stil there.  If she does not talk the cough is better.  She has noticed some slight difficulty getting enough oxygen or a full deep breath, but denies shortness of breath.  She notices it more at night when she lays down.  She is able to exercise and has no difficulty.  She has some wheeze.   She walks daily and does exercise class and feels ok with this.    She denies cold symptoms.  She is going on vacation soon and wanted to make sure everything is ok.    Medications and allergies reviewed with patient and updated if appropriate.  Patient Active Problem List   Diagnosis Date Noted  . Migraine 12/30/2015  . Upper airway cough syndrome 10/29/2015  . Osteoporosis 06/06/2011  . Vitamin D deficiency 03/22/2010  . BREAST CANCER, HX OF 03/22/2010  . SPINAL STENOSIS, LUMBAR 03/25/2008  . HYPERLIPIDEMIA 09/28/2007  . LOW BACK PAIN 05/24/2007  . Fibromyalgia 05/24/2007    Current Outpatient Prescriptions on File Prior to Visit  Medication Sig Dispense Refill  . benzonatate (TESSALON) 200 MG capsule One four times daily as needed for cough 40 capsule 2  . eletriptan (RELPAX) 20 MG tablet TAKE 1 TABLET BY MOUTH AT ONSET OF HEADACHE. MAY REPEAT 1 TABLET IN 2 HOURS IF HEADACHE PERSISTS OR RECURS 9 tablet 0  . fluticasone furoate-vilanterol (BREO ELLIPTA) 100-25 MCG/INH AEPB Inhale 1 puff into the lungs daily. 1 each 1  . gabapentin (NEURONTIN) 100 MG capsule Take 1 capsule (100 mg total) by mouth 3 (three) times daily as needed. 30 capsule 3  . traZODone (DESYREL) 50 MG tablet Take 3 tablets (150 mg total) by mouth at bedtime. 270 tablet 3   No current  facility-administered medications on file prior to visit.    Past Medical History  Diagnosis Date  . Arthritis   . Bronchitis     recurrent  . Pneumonia     as OP X 2; no Pneumovax  . Fibromyalgia   . Vitamin D deficiency     18 in  01/2009  . Hyperlipemia   . Chronic low back pain     bulging disc & spinal stenosis  . Breast cancer ([place])     R lumpectomy ; radiation; no chemotherapy  . Osteoporosis     Past Surgical History  Procedure Laterality Date  . Cesarean section       G 2 P 2  . Abdominal hysterectomy  1990    USO for fibroid , Dr Maryruth Eve  . Cystectomy  2010  . Breast surgery  2010    R side lumpectomy; Dr Margot Chimes  . No colonoscopy      "I just don't want to do it". SOC reviewed    Social History   Social History  . Marital Status: Married    Spouse Name: N/A  . Number of Children: N/A  . Years of Education: N/A   Social History Main Topics  . Smoking status: Former Smoker    Quit date: 12/19/1970  . Smokeless tobacco: None  Comment: [phone], up to 3 cigarettes / day  . Alcohol Use: No  . Drug Use: No  . Sexual Activity: Not Asked   Other Topics Concern  . None   Social History Narrative    Family History  Problem Relation Age of Onset  . Atrial fibrillation Mother     S/P cardioversion  . Ulcers Mother   . Hypertension Mother   . Heart attack Father 30    died @ 63  . Heart attack Paternal Aunt     < 31  . Heart attack Paternal Grandfather     > 55  . Heart attack Paternal Uncle     > 55  . Heart attack Paternal Grandmother     < 10  . Hypertension Brother   . Thyroid disease Daughter     on Synthroid since high school  . Diabetes Brother   . Stroke Neg Hx     Review of Systems  Constitutional: Negative for fever and chills.  HENT: Positive for rhinorrhea (not new). Negative for congestion, postnasal drip, sinus pressure, trouble swallowing and voice change (no hoarseness).   Respiratory: Positive for cough, shortness of  breath and wheezing.   Cardiovascular: Negative for chest pain and palpitations.       Objective:   Filed Vitals:   04/20/16 1641  BP: 126/80  Pulse: 78  Temp: 98.7 F (37.1 C)  Resp: 16   Filed Weights   04/20/16 1641  Weight: 125 lb (56.7 kg)   Body mass index is 21.13 kg/(m^2).   Physical Exam GENERAL APPEARANCE: Appears stated age, well appearing, NAD EYES: conjunctiva clear, no icterus HEENT: bilateral tympanic membranes and ear canals normal, oropharynx with mild erythema, no thyromegaly, trachea midline, no cervical or supraclavicular lymphadenopathy LUNGS: Clear to auscultation without wheeze or crackles, unlabored breathing, good air entry bilaterally HEART: Normal S1,S2 without murmurs EXTREMITIES: Without clubbing, cyanosis, or edema      Assessment & Plan:   See Problem List for Assessment and Plan of chronic medical problems.

## 2016-04-20 NOTE — Assessment & Plan Note (Signed)
Cough is better overall, worse with talking Slight feeling of not getting a good deep breath or enough oxygen - no shortness of breath, no difficulty exercising No cold symptoms Exam is normal Reassured, she will monitor closely No additional treatment Given rx for zpak for trip just in case - not to be taken now

## 2016-04-20 NOTE — Patient Instructions (Signed)
    Medications reviewed and updated.  No changes recommended at this time.     

## 2016-04-20 NOTE — Progress Notes (Signed)
Pre visit review using our clinic review tool, if applicable. No additional management support is needed unless otherwise documented below in the visit note. 

## 2016-05-23 DIAGNOSIS — H02831 Dermatochalasis of right upper eyelid: Secondary | ICD-10-CM | POA: Diagnosis not present

## 2016-06-06 DIAGNOSIS — L814 Other melanin hyperpigmentation: Secondary | ICD-10-CM | POA: Diagnosis not present

## 2016-06-06 DIAGNOSIS — L821 Other seborrheic keratosis: Secondary | ICD-10-CM | POA: Diagnosis not present

## 2016-06-06 DIAGNOSIS — D2271 Melanocytic nevi of right lower limb, including hip: Secondary | ICD-10-CM | POA: Diagnosis not present

## 2016-06-06 DIAGNOSIS — D2261 Melanocytic nevi of right upper limb, including shoulder: Secondary | ICD-10-CM | POA: Diagnosis not present

## 2016-07-01 ENCOUNTER — Other Ambulatory Visit: Payer: Self-pay | Admitting: Internal Medicine

## 2016-07-01 DIAGNOSIS — H04123 Dry eye syndrome of bilateral lacrimal glands: Secondary | ICD-10-CM | POA: Diagnosis not present

## 2016-07-06 DIAGNOSIS — H02831 Dermatochalasis of right upper eyelid: Secondary | ICD-10-CM | POA: Diagnosis not present

## 2016-07-15 DIAGNOSIS — H04123 Dry eye syndrome of bilateral lacrimal glands: Secondary | ICD-10-CM | POA: Diagnosis not present

## 2016-07-18 ENCOUNTER — Ambulatory Visit: Payer: BLUE CROSS/BLUE SHIELD | Attending: General Surgery | Admitting: Physical Therapy

## 2016-07-18 ENCOUNTER — Encounter: Payer: Self-pay | Admitting: Physical Therapy

## 2016-07-18 DIAGNOSIS — I890 Lymphedema, not elsewhere classified: Secondary | ICD-10-CM | POA: Diagnosis not present

## 2016-07-18 NOTE — Therapy (Signed)
[facility] [address] [place], [place], [address] Phone: [phone]   Fax:  [phone]  Physical Therapy Evaluation  Patient Details  Name: [name] MRN: [id number] Date of Birth: [birth year] Referring Provider: Donell Beers  Encounter Date: 07/18/2016      PT End of Session - 07/18/16 1445    Visit Number 1   Number of Visits 1   PT Start Time 1345   PT Stop Time 1440   PT Time Calculation (min) 55 min   Activity Tolerance Patient tolerated treatment well   Behavior During Therapy [facility] for tasks assessed/performed      Past Medical History:  Diagnosis Date  . Arthritis   . Breast cancer ([facility])    R lumpectomy ; radiation; no chemotherapy  . Bronchitis    recurrent  . Chronic low back pain    bulging disc & spinal stenosis  . Fibromyalgia   . Hyperlipemia   . Osteoporosis   . Pneumonia    as OP X 2; no Pneumovax  . Vitamin D deficiency    18 in  01/2009    Past Surgical History:  Procedure Laterality Date  . ABDOMINAL HYSTERECTOMY  1990   USO for fibroid , Dr Laureen Ochs  . BREAST SURGERY  2010   R side lumpectomy; Dr Jamey Ripa  . CESAREAN SECTION      G 2 P 2  . CYSTECTOMY  2010  . no colonoscopy     "I just don't want to do it". SOC reviewed    There were no vitals filed for this visit.       Subjective Assessment - 07/18/16 1354    Subjective I wish there was a way to just draw the fluid out. I have been here before and I do the massage but it is still swelling. I can not tolerate on of those compression bras. They dig in to my ribs and my breast is so tender. I try to do the massage once a day if I am sore. It is hard to do because I have arthritis in my right hand but I try to do it once per day. My breast did well after therapy before but in the past 6-8 months I noticed it started swelling up again.    Pertinent History Pt with history of right breast cancer s/p lumpectomy in 2010. Pt completed  radiation therapy in 2011 and has been having edema in right breast since 2012   Patient Stated Goals to reduce the size of my right breast   Currently in Pain? No/denies            [facility] PT Assessment - 07/18/16 0001      Assessment   Medical Diagnosis right breast cancer   Referring Provider Byerly   Onset Date/Surgical Date 10/19/09   Hand Dominance Right   Prior Therapy lymphedema therapy 2 years ago     Precautions   Precautions Other (comment)  lymphedema     Restrictions   Weight Bearing Restrictions No     Balance Screen   Has the patient fallen in the past 6 months No   Has the patient had a decrease in activity level because of a fear of falling?  No   Is the patient reluctant to leave their home because of a fear of falling?  No     Home Nurse, mental health Private residence   Living Arrangements Spouse/significant other   Available Help  at Discharge Family   Type of Home Other(Comment)  town home   [place] to enter   Entrance Stairs-Number of Steps 20   Entrance Stairs-Rails Right  on one side and left on the other side   Home Layout Multi-level   Alternate Level Stairs-Number of Steps 14   Alternate Level Stairs-Rails Right   Home Equipment None     Prior Function   Level of Independence Independent   Vocation Other (comment)  housewife   Vocation Requirements normal house work - vacuum, mop etc   Leisure gentle fitness class 2x/wk, tries to walk several times a [employer]   Overall Cognitive Status Within Functional Limits for tasks assessed     Observation/Other Assessments   Other Surveys  --  LLIS: 32% impairment           LYMPHEDEMA/ONCOLOGY QUESTIONNAIRE - 07/18/16 1402      Type   Cancer Type right breast cancer     Surgeries   Lumpectomy Date 10/19/09   Sentinel Lymph Node Biopsy Date 10/19/09   Number Lymph Nodes Removed --  pt states small number of lymph nodes     Date  Lymphedema/Swelling Started   Date 10/20/11     Treatment   Active Chemotherapy Treatment No   Past Chemotherapy Treatment No   Active Radiation Treatment No   Past Radiation Treatment Yes   Date 01/19/10   Body Site right breast   Current Hormone Treatment No   Past Hormone Therapy No     What other symptoms do you have   Are you Having Heaviness or Tightness Yes   Are you having Pain Yes  has pain after self massage   Are you having pitting edema No   Is it Hard or Difficult finding clothes that fit No   Do you have infections No   Is there Decreased scar mobility No     Lymphedema Assessments   Lymphedema Assessments Upper extremities     Right Upper Extremity Lymphedema   15 cm Proximal to Olecranon Process 28 cm   Olecranon Process 22.3 cm   15 cm Proximal to Ulnar Styloid Process 21.1 cm   Just Proximal to Ulnar Styloid Process 13.9 cm   Across Hand at [employer] 18 cm   At [place] of 2nd Digit 5.5 cm     Left Upper Extremity Lymphedema   15 cm Proximal to Olecranon Process 26.5 cm   Olecranon Process 21 cm   15 cm Proximal to Ulnar Styloid Process 20 cm   Just Proximal to Ulnar Styloid Process 14 cm   Across Hand at [employer] 17.5 cm   At [place] of 2nd Digit 5.8 cm                [facility] Adult PT Treatment/Exercise - 07/18/16 0001      Manual Therapy   Manual Therapy Edema management   Edema Management made pt a pad to wear in her bra against lateral side of breast out of 1/2 inch foam covered in thick stockinette to help control edema                PT Education - 07/18/16 1444    Education provided Yes   Education Details educated on lymphedema risk reduction practices, need for compression sleeve, anatomy and physiology of lymphatic system, and compression pumps   Person(s) Educated Patient   Methods Explanation;Handout;Verbal cues   Comprehension  Verbalized understanding                Long Term Clinic Goals - 07/18/16  1451      CC Long Term Goal  #1   Title Pt will be educated about risk reduction practices for lymphedema   Status Achieved            Plan - 07/18/16 1445    Clinical Impression Statement Pt arrived to clinic with compliants of right breast lymphedema. She had a lumpectomy in 2010 and underwent radiation. She began having edema in 2012. She received lymphedema services at this facility two years ago. She was able to correctly verbalize steps for self manual lymphatic drainage. She has a history of fibromyalgia and states the massage makes her very sore even when she was recieving therapy. Pt is going out of town this week and will not be back until October. Pt was given a piece of grey foam to wear in her bra to give her extra compression. She does not have any fibrosis at this time but her right lateral breast is larger than left. She is unable to tolerate a compression bra. She was also educated about compression pumps and states she would like to try the foam for a while first before the pump. She was given a handout about swell spots and how to obtain them to replace the grey foam. She was also educated on lymphedema risk reduction practices.    PT Frequency One time visit   PT Treatment/Interventions ADLs/Self Care Home Management   PT Next Visit Plan one time visit   Consulted and Agree with Plan of Care Patient      Patient will benefit from skilled therapeutic intervention in order to improve the following deficits and impairments:  Increased edema, Decreased knowledge of precautions  Visit Diagnosis: Lymphedema, not elsewhere classified - Plan: PT plan of care cert/re-cert     Problem List Patient Active Problem List   Diagnosis Date Noted  . Migraine 12/30/2015  . Upper airway cough syndrome 10/29/2015  . Osteoporosis 06/06/2011  . Vitamin D deficiency 03/22/2010  . BREAST CANCER, HX OF 03/22/2010  . SPINAL STENOSIS, LUMBAR 03/25/2008  . HYPERLIPIDEMIA 09/28/2007  .  LOW BACK PAIN 05/24/2007  . Fibromyalgia 05/24/2007    Alexia Freestone 07/18/2016, 3:05 PM  [facility] [place], [place], [address] Phone: [phone]   Fax:  [phone]  Name: [name] MRN: [id number] Date of Birth: June 03, 1946  PHYSICAL THERAPY DISCHARGE SUMMARY  Visits from Start of Care: 1  Current functional level related to goals / functional outcomes: See above   Remaining deficits: Right breast edema but pt educated about foam in bra   Education / Equipment: Foam pad to wear in bra, see above under education Plan: Patient agrees to discharge.  Patient goals were met. Patient is being discharged due to meeting the stated rehab goals.  ?????

## 2016-07-19 ENCOUNTER — Ambulatory Visit: Payer: BLUE CROSS/BLUE SHIELD | Admitting: Physical Therapy

## 2016-07-29 DIAGNOSIS — H02831 Dermatochalasis of right upper eyelid: Secondary | ICD-10-CM | POA: Diagnosis not present

## 2016-07-29 DIAGNOSIS — H02403 Unspecified ptosis of bilateral eyelids: Secondary | ICD-10-CM | POA: Diagnosis not present

## 2016-07-29 DIAGNOSIS — H53453 Other localized visual field defect, bilateral: Secondary | ICD-10-CM | POA: Diagnosis not present

## 2016-07-29 DIAGNOSIS — H02401 Unspecified ptosis of right eyelid: Secondary | ICD-10-CM | POA: Diagnosis not present

## 2016-07-29 DIAGNOSIS — H02834 Dermatochalasis of left upper eyelid: Secondary | ICD-10-CM | POA: Diagnosis not present

## 2016-08-12 ENCOUNTER — Other Ambulatory Visit: Payer: Self-pay | Admitting: Internal Medicine

## 2016-10-02 ENCOUNTER — Encounter: Payer: Self-pay | Admitting: Internal Medicine

## 2016-10-02 NOTE — Progress Notes (Signed)
Subjective:    Patient ID: [name], female    DOB: 23-Oct-1946, 70 y.o.   MRN: [id number]  HPI She is here for a physical exam.   She still has the chronic cough.  She feels like breathing is harder - she is aware of it, but it does not stop her from doing anything.  She denies shortness of breath.  She thinks the cough is slightly better.  Activity does not make it worse. She gets a headache from the breo and she is unsure if it helps.  She has seen pulmonary about one year ago - Dr Melvyn Novas thought she had GERD and she denies any GERD symptoms and does not feel that is the cause of her cough.      Medications and allergies reviewed with patient and updated if appropriate.  Patient Active Problem List   Diagnosis Date Noted  . Migraine 12/30/2015  . Upper airway cough syndrome 10/29/2015  . Osteoporosis 06/06/2011  . Vitamin D deficiency 03/22/2010  . BREAST CANCER, HX OF 03/22/2010  . SPINAL STENOSIS, LUMBAR 03/25/2008  . HYPERLIPIDEMIA 09/28/2007  . LOW BACK PAIN 05/24/2007  . Fibromyalgia 05/24/2007    Current Outpatient Prescriptions on File Prior to Visit  Medication Sig Dispense Refill  . benzonatate (TESSALON) 200 MG capsule One four times daily as needed for cough 40 capsule 2  . fluticasone furoate-vilanterol (BREO ELLIPTA) 100-25 MCG/INH AEPB Inhale 1 puff into the lungs daily. 1 each 1  . gabapentin (NEURONTIN) 100 MG capsule Take 1 capsule (100 mg total) by mouth 3 (three) times daily as needed. 30 capsule 3  . RELPAX 20 MG tablet TAKE 1 TABLET BY MOUTH AT ONSET OF HEADACHE. MAY REPEAT 1 TABLET IN 2 HOURS IF HEADACHE PERSISTS OR RECURS 9 tablet 0  . traZODone (DESYREL) 50 MG tablet TAKE 3 TABLETS(150 MG) BY MOUTH AT BEDTIME 270 tablet 0   No current facility-administered medications on file prior to visit.     Past Medical History:  Diagnosis Date  . Arthritis   . Breast cancer ([place])    R lumpectomy ; radiation; no chemotherapy  . Bronchitis    recurrent  . Chronic low back pain    bulging disc & spinal stenosis  . Fibromyalgia   . Hyperlipemia   . Osteoporosis   . Pneumonia    as OP X 2; no Pneumovax  . Vitamin D deficiency    19 in  01/2009    Past Surgical History:  Procedure Laterality Date  . ABDOMINAL HYSTERECTOMY  1990   USO for fibroid , Dr Maryruth Eve  . BREAST SURGERY  2010   R side lumpectomy; Dr Margot Chimes  . CESAREAN SECTION      G 2 P 2  . CYSTECTOMY  2010  . no colonoscopy     "I just don't want to do it". SOC reviewed    Social History   Social History  . Marital status: Married    Spouse name: N/A  . Number of children: N/A  . Years of education: N/A   Social History Main Topics  . Smoking status: Former Smoker    Quit date: 12/19/1970  . Smokeless tobacco: Not on file     Comment: [phone], up to 3 cigarettes / day  . Alcohol use No  . Drug use: No  . Sexual activity: Not on file   Other Topics Concern  . Not on file   Social History Narrative  . No  narrative on file    Family History  Problem Relation Age of Onset  . Atrial fibrillation Mother     S/P cardioversion  . Ulcers Mother   . Hypertension Mother   . Heart attack Father 33    died @ 103  . Heart attack Paternal Aunt     < 81  . Heart attack Paternal Grandfather     > 55  . Heart attack Paternal Uncle     > 55  . Heart attack Paternal Grandmother     < 42  . Hypertension Brother   . Thyroid disease Daughter     on Synthroid since high school  . Diabetes Brother   . Stroke Neg Hx     Review of Systems  Constitutional: Negative for appetite change, chills, diaphoresis, fatigue, fever and unexpected weight change.  HENT: Negative for hearing loss.   Eyes: Negative for visual disturbance.  Respiratory: Positive for cough and wheezing (sometimes). Negative for shortness of breath.        Harder to breath  Cardiovascular: Positive for chest pain (chronic, occ, transient) and palpitations (flip flops on occasion).  Negative for leg swelling.  Gastrointestinal: Negative for abdominal pain, blood in stool, constipation, diarrhea and nausea.       No gerd  Genitourinary: Negative for dysuria and hematuria.  Musculoskeletal: Positive for back pain (tylenol ES, stays on back 12 hrs).  Skin: Negative for color change and rash.  Neurological: Negative for dizziness, light-headedness and headaches.  Psychiatric/Behavioral: Negative for dysphoric mood. The patient is not nervous/anxious.        Objective:   Vitals:   10/03/16 1429  BP: 122/74  Pulse: 74  Resp: 16  Temp: 98 F (36.7 C)   Filed Weights   10/03/16 1429  Weight: 126 lb (57.2 kg)   Body mass index is 20.97 kg/m.   Physical Exam Constitutional: She appears well-developed and well-nourished. No distress.  HENT:  Head: Normocephalic and atraumatic.  Right Ear: External ear normal. Normal ear canal and TM Left Ear: External ear normal.  Normal ear canal and TM Mouth/Throat: Oropharynx is clear and moist.  Eyes: Conjunctivae and EOM are normal.  Neck: Neck supple. No tracheal deviation present. No thyromegaly present.  No carotid bruit  Cardiovascular: Normal rate, regular rhythm and normal heart sounds.   No murmur heard.  No edema. Pulmonary/Chest: Effort normal and breath sounds normal. No respiratory distress. She has no wheezes. She has no rales.  Breast: deferred to Gyn Abdominal: Soft. She exhibits no distension. There is no tenderness.  Lymphadenopathy: She has no cervical adenopathy.  Skin: Skin is warm and dry. She is not diaphoretic.  Psychiatric: She has a normal mood and affect. Her behavior is normal.         Assessment & Plan:   Physical exam: Screening blood work  ordered Immunizations  Discussed immunizations - deferred all Colonoscopy - has never had one, deferred one today. Discussed cologuard Mammogram  Up to date  Gyn - not following with gyn  Dexa  Due - last dexa 2014 Eye exams  Up to  date Exercise - walks most days, takes fitness class twice a week Weight - normal BMI Skin - no concerns Substance abuse  - none  See Problem List for Assessment and Plan of chronic medical problems.

## 2016-10-02 NOTE — Patient Instructions (Addendum)
Think about colon cancer screening with cologuard.  You need to check with your insurance company to see if it is covered.    Test(s) ordered today. Your results will be released to [facility] (or called to you) after review, usually within 72hours after test completion. If any changes need to be made, you will be notified at that same time.  All other Health Maintenance issues reviewed.   All recommended immunizations and age-appropriate screenings are up-to-date or discussed.  No immunizations administered today.   Medications reviewed and updated.  No changes recommended at this time.  Your prescription(s) have been submitted to your pharmacy. Please take as directed and contact our office if you believe you are having problem(s) with the medication(s).  Please followup in one year, sooner if needed  Health Maintenance, Female Adopting a healthy lifestyle and getting preventive care can go a long way to promote health and wellness. Talk with your health care provider about what schedule of regular examinations is right for you. This is a good chance for you to check in with your provider about disease prevention and staying healthy. In between checkups, there are plenty of things you can do on your own. Experts have done a lot of research about which lifestyle changes and preventive measures are most likely to keep you healthy. Ask your health care provider for more information. WEIGHT AND DIET  Eat a healthy diet  Be sure to include plenty of vegetables, fruits, low-fat dairy products, and lean protein.  Do not eat a lot of foods high in solid fats, added sugars, or salt.  Get regular exercise. This is one of the most important things you can do for your health.  Most adults should exercise for at least 150 minutes each week. The exercise should increase your heart rate and make you sweat (moderate-intensity exercise).  Most adults should also do strengthening exercises at least twice a  week. This is in addition to the moderate-intensity exercise.  Maintain a healthy weight  Body mass index (BMI) is a measurement that can be used to identify possible weight problems. It estimates body fat based on height and weight. Your health care provider can help determine your BMI and help you achieve or maintain a healthy weight.  For females 90 years of age and older:   A BMI below 18.5 is considered underweight.  A BMI of 18.5 to 24.9 is normal.  A BMI of 25 to 29.9 is considered overweight.  A BMI of 30 and above is considered obese.  Watch levels of cholesterol and blood lipids  You should start having your blood tested for lipids and cholesterol at 69 years of age, then have this test every 5 years.  You may need to have your cholesterol levels checked more often if:  Your lipid or cholesterol levels are high.  You are older than 69 years of age.  You are at high risk for heart disease.  CANCER SCREENING   Lung Cancer  Lung cancer screening is recommended for adults 41-37 years old who are at high risk for lung cancer because of a history of smoking.  A yearly low-dose CT scan of the lungs is recommended for people who:  Currently smoke.  Have quit within the past 15 years.  Have at least a 30-pack-year history of smoking. A pack year is smoking an average of one pack of cigarettes a day for 1 year.  Yearly screening should continue until it has been 15 years  since you quit.  Yearly screening should stop if you develop a health problem that would prevent you from having lung cancer treatment.  Breast Cancer  Practice breast self-awareness. This means understanding how your breasts normally appear and feel.  It also means doing regular breast self-exams. Let your health care provider know about any changes, no matter how small.  If you are in your 20s or 30s, you should have a clinical breast exam (CBE) by a health care provider every 1-3 years as part  of a regular health exam.  If you are 98 or older, have a CBE every year. Also consider having a breast X-ray (mammogram) every year.  If you have a family history of breast cancer, talk to your health care provider about genetic screening.  If you are at high risk for breast cancer, talk to your health care provider about having an MRI and a mammogram every year.  Breast cancer gene (BRCA) assessment is recommended for women who have family members with BRCA-related cancers. BRCA-related cancers include:  Breast.  Ovarian.  Tubal.  Peritoneal cancers.  Results of the assessment will determine the need for genetic counseling and BRCA1 and BRCA2 testing. Cervical Cancer Your health care provider may recommend that you be screened regularly for cancer of the pelvic organs (ovaries, uterus, and vagina). This screening involves a pelvic examination, including checking for microscopic changes to the surface of your cervix (Pap test). You may be encouraged to have this screening done every 3 years, beginning at age 42.  For women ages 61-65, health care providers may recommend pelvic exams and Pap testing every 3 years, or they may recommend the Pap and pelvic exam, combined with testing for human papilloma virus (HPV), every 5 years. Some types of HPV increase your risk of cervical cancer. Testing for HPV may also be done on women of any age with unclear Pap test results.  Other health care providers may not recommend any screening for nonpregnant women who are considered low risk for pelvic cancer and who do not have symptoms. Ask your health care provider if a screening pelvic exam is right for you.  If you have had past treatment for cervical cancer or a condition that could lead to cancer, you need Pap tests and screening for cancer for at least 20 years after your treatment. If Pap tests have been discontinued, your risk factors (such as having a new sexual partner) need to be reassessed to  determine if screening should resume. Some women have medical problems that increase the chance of getting cervical cancer. In these cases, your health care provider may recommend more frequent screening and Pap tests. Colorectal Cancer  This type of cancer can be detected and often prevented.  Routine colorectal cancer screening usually begins at 69 years of age and continues through 69 years of age.  Your health care provider may recommend screening at an earlier age if you have risk factors for colon cancer.  Your health care provider may also recommend using home test kits to check for hidden blood in the stool.  A small camera at the end of a tube can be used to examine your colon directly (sigmoidoscopy or colonoscopy). This is done to check for the earliest forms of colorectal cancer.  Routine screening usually begins at age 52.  Direct examination of the colon should be repeated every 5-10 years through 69 years of age. However, you may need to be screened more often if early forms  of precancerous polyps or small growths are found. Skin Cancer  Check your skin from head to toe regularly.  Tell your health care provider about any new moles or changes in moles, especially if there is a change in a mole's shape or color.  Also tell your health care provider if you have a mole that is larger than the size of a pencil eraser.  Always use sunscreen. Apply sunscreen liberally and repeatedly throughout the day.  Protect yourself by wearing long sleeves, pants, a wide-brimmed hat, and sunglasses whenever you are outside. HEART DISEASE, DIABETES, AND HIGH BLOOD PRESSURE   High blood pressure causes heart disease and increases the risk of stroke. High blood pressure is more likely to develop in:  People who have blood pressure in the high end of the normal range (130-139/85-89 mm Hg).  People who are overweight or obese.  People who are African American.  If you are 18-39 years of  age, have your blood pressure checked every 3-5 years. If you are 27 years of age or older, have your blood pressure checked every year. You should have your blood pressure measured twice--once when you are at a hospital or clinic, and once when you are not at a hospital or clinic. Record the average of the two measurements. To check your blood pressure when you are not at a hospital or clinic, you can use:  An automated blood pressure machine at a pharmacy.  A home blood pressure monitor.  If you are between 76 years and 45 years old, ask your health care provider if you should take aspirin to prevent strokes.  Have regular diabetes screenings. This involves taking a blood sample to check your fasting blood sugar level.  If you are at a normal weight and have a low risk for diabetes, have this test once every three years after 69 years of age.  If you are overweight and have a high risk for diabetes, consider being tested at a younger age or more often. PREVENTING INFECTION  Hepatitis B  If you have a higher risk for hepatitis B, you should be screened for this virus. You are considered at high risk for hepatitis B if:  You were born in a country where hepatitis B is common. Ask your health care provider which countries are considered high risk.  Your parents were born in a high-risk country, and you have not been immunized against hepatitis B (hepatitis B vaccine).  You have HIV or AIDS.  You use needles to inject street drugs.  You live with someone who has hepatitis B.  You have had sex with someone who has hepatitis B.  You get hemodialysis treatment.  You take certain medicines for conditions, including cancer, organ transplantation, and autoimmune conditions. Hepatitis C  Blood testing is recommended for:  Everyone born from [birth year] through 1964.  Anyone with known risk factors for hepatitis C. Sexually transmitted infections (STIs)  You should be screened for sexually  transmitted infections (STIs) including gonorrhea and chlamydia if:  You are sexually active and are younger than 69 years of age.  You are older than 69 years of age and your health care provider tells you that you are at risk for this type of infection.  Your sexual activity has changed since you were last screened and you are at an increased risk for chlamydia or gonorrhea. Ask your health care provider if you are at risk.  If you do not have HIV, but are at  risk, it may be recommended that you take a prescription medicine daily to prevent HIV infection. This is called pre-exposure prophylaxis (PrEP). You are considered at risk if:  You are sexually active and do not regularly use condoms or know the HIV status of your partner(s).  You take drugs by injection.  You are sexually active with a partner who has HIV. Talk with your health care provider about whether you are at high risk of being infected with HIV. If you choose to begin PrEP, you should first be tested for HIV. You should then be tested every 3 months for as long as you are taking PrEP.  PREGNANCY   If you are premenopausal and you may become pregnant, ask your health care provider about preconception counseling.  If you may become pregnant, take 400 to 800 micrograms (mcg) of folic acid every day.  If you want to prevent pregnancy, talk to your health care provider about birth control (contraception). OSTEOPOROSIS AND MENOPAUSE   Osteoporosis is a disease in which the bones lose minerals and strength with aging. This can result in serious bone fractures. Your risk for osteoporosis can be identified using a bone density scan.  If you are 64 years of age or older, or if you are at risk for osteoporosis and fractures, ask your health care provider if you should be screened.  Ask your health care provider whether you should take a calcium or vitamin D supplement to lower your risk for osteoporosis.  Menopause may have  certain physical symptoms and risks.  Hormone replacement therapy may reduce some of these symptoms and risks. Talk to your health care provider about whether hormone replacement therapy is right for you.  HOME CARE INSTRUCTIONS   Schedule regular health, dental, and eye exams.  Stay current with your immunizations.   Do not use any tobacco products including cigarettes, chewing tobacco, or electronic cigarettes.  If you are pregnant, do not drink alcohol.  If you are breastfeeding, limit how much and how often you drink alcohol.  Limit alcohol intake to no more than 1 drink per day for nonpregnant women. One drink equals 12 ounces of beer, 5 ounces of wine, or 1 ounces of hard liquor.  Do not use street drugs.  Do not share needles.  Ask your health care provider for help if you need support or information about quitting drugs.  Tell your health care provider if you often feel depressed.  Tell your health care provider if you have ever been abused or do not feel safe at home.   This information is not intended to replace advice given to you by your health care provider. Make sure you discuss any questions you have with your health care provider.   Document Released: 06/20/2011 Document Revised: 12/26/2014 Document Reviewed: 11/06/2013 Elsevier Interactive Patient Education [employer].

## 2016-10-03 ENCOUNTER — Ambulatory Visit (INDEPENDENT_AMBULATORY_CARE_PROVIDER_SITE_OTHER): Payer: BLUE CROSS/BLUE SHIELD | Admitting: Internal Medicine

## 2016-10-03 ENCOUNTER — Encounter: Payer: Self-pay | Admitting: Internal Medicine

## 2016-10-03 VITALS — BP 122/74 | HR 74 | Temp 98.0°F | Resp 16 | Ht 65.0 in | Wt 126.0 lb

## 2016-10-03 DIAGNOSIS — R059 Cough, unspecified: Secondary | ICD-10-CM

## 2016-10-03 DIAGNOSIS — R05 Cough: Secondary | ICD-10-CM | POA: Diagnosis not present

## 2016-10-03 DIAGNOSIS — M816 Localized osteoporosis [Lequesne]: Secondary | ICD-10-CM | POA: Diagnosis not present

## 2016-10-03 DIAGNOSIS — Z0000 Encounter for general adult medical examination without abnormal findings: Secondary | ICD-10-CM | POA: Diagnosis not present

## 2016-10-03 DIAGNOSIS — R058 Other specified cough: Secondary | ICD-10-CM

## 2016-10-03 MED ORDER — TRAZODONE HCL 50 MG PO TABS: ORAL_TABLET | ORAL | 3 refills | Status: DC

## 2016-10-03 NOTE — Assessment & Plan Note (Signed)
Still with chronic cough, but she feels it is improving.  Also notes breathing is harder, no SOB Will check CXR Consider referral to ENT or back to pulmonary - she will let me know

## 2016-10-03 NOTE — Assessment & Plan Note (Addendum)
Last dexa 2014  dexa orderd Did not tolerate bisophos Not currently taking calcium and vitamin D Walking for exercise and does a fitness class 2/week

## 2016-10-03 NOTE — Progress Notes (Signed)
Pre visit review using our clinic review tool, if applicable. No additional management support is needed unless otherwise documented below in the visit note. 

## 2016-10-24 ENCOUNTER — Other Ambulatory Visit: Payer: Self-pay | Admitting: General Surgery

## 2016-10-24 DIAGNOSIS — Z853 Personal history of malignant neoplasm of breast: Secondary | ICD-10-CM

## 2016-10-31 ENCOUNTER — Ambulatory Visit
Admission: RE | Admit: 2016-10-31 | Discharge: 2016-10-31 | Disposition: A | Discharge status: Home / Self Care | Payer: BLUE CROSS/BLUE SHIELD | Source: Ambulatory Visit | Attending: General Surgery | Admitting: General Surgery

## 2016-10-31 ENCOUNTER — Other Ambulatory Visit (INDEPENDENT_AMBULATORY_CARE_PROVIDER_SITE_OTHER): Payer: BLUE CROSS/BLUE SHIELD

## 2016-10-31 DIAGNOSIS — M816 Localized osteoporosis [Lequesne]: Secondary | ICD-10-CM | POA: Diagnosis not present

## 2016-10-31 DIAGNOSIS — Z0000 Encounter for general adult medical examination without abnormal findings: Secondary | ICD-10-CM | POA: Diagnosis not present

## 2016-10-31 DIAGNOSIS — R928 Other abnormal and inconclusive findings on diagnostic imaging of breast: Secondary | ICD-10-CM | POA: Diagnosis not present

## 2016-10-31 DIAGNOSIS — Z853 Personal history of malignant neoplasm of breast: Secondary | ICD-10-CM

## 2016-10-31 LAB — COMPREHENSIVE METABOLIC PANEL
ALBUMIN: 4.5 g/dL (ref 3.5–5.2)
ALT: 13 U/L (ref 0–35)
AST: 13 U/L (ref 0–37)
Alkaline Phosphatase: 58 U/L (ref 39–117)
BUN: 15 mg/dL (ref 6–23)
CALCIUM: 9.3 mg/dL (ref 8.4–10.5)
CHLORIDE: 106 meq/L (ref 96–112)
CO2: 27 meq/L (ref 19–32)
Creatinine, Ser: 0.97 mg/dL (ref 0.40–1.20)
GFR: 60.4 mL/min (ref >60.00)
Glucose, Bld: 96 mg/dL (ref 70–99)
POTASSIUM: 4.6 meq/L (ref 3.5–5.1)
Sodium: 140 mEq/L (ref 135–145)
Total Bilirubin: 0.6 mg/dL (ref 0.2–1.2)
Total Protein: 7.1 g/dL (ref 6.0–8.3)

## 2016-10-31 LAB — CBC WITH DIFFERENTIAL/PLATELET
BASOS PCT: 0.6 % (ref 0.0–3.0)
Basophils Absolute: 0 10*3/uL (ref 0.0–0.1)
EOS ABS: 0 10*3/uL (ref 0.0–0.7)
EOS PCT: 0.8 % (ref 0.0–5.0)
HEMATOCRIT: 38 % (ref 36.0–46.0)
HEMOGLOBIN: 13.1 g/dL (ref 12.0–15.0)
LYMPHS PCT: 16.4 % (ref 12.0–46.0)
Lymphs Abs: 0.8 10*3/uL (ref 0.7–4.0)
MCHC: 34.4 g/dL (ref 30.0–36.0)
MCV: 90.3 fl (ref 78.0–100.0)
MONO ABS: 0.4 10*3/uL (ref 0.1–1.0)
Monocytes Relative: 9.3 % (ref 3.0–12.0)
NEUTROS ABS: 3.5 10*3/uL (ref 1.4–7.7)
Neutrophils Relative %: 72.9 % (ref 43.0–77.0)
PLATELETS: 251 10*3/uL (ref 150.0–400.0)
RBC: 4.21 Mil/uL (ref 3.87–5.11)
RDW: 13.7 % (ref 11.5–15.5)
WBC: 4.8 10*3/uL (ref 4.0–10.5)

## 2016-10-31 LAB — LIPID PANEL
CHOL/HDL RATIO: 3
Cholesterol: 221 mg/dL — ABNORMAL HIGH (ref 0–200)
HDL: 71.6 mg/dL (ref >39.00)
LDL CALC: 137 mg/dL — AB (ref 0–99)
NonHDL: 149.32
TRIGLYCERIDES: 64 mg/dL (ref 0.0–149.0)
VLDL: 12.8 mg/dL (ref 0.0–40.0)

## 2016-10-31 LAB — VITAMIN D 25 HYDROXY (VIT D DEFICIENCY, FRACTURES): VITD: 23.57 ng/mL — ABNORMAL LOW (ref 30.00–100.00)

## 2016-10-31 LAB — TSH: TSH: 1.57 u[IU]/mL (ref 0.35–4.50)

## 2016-11-04 ENCOUNTER — Encounter: Payer: Self-pay | Admitting: Internal Medicine

## 2016-11-11 ENCOUNTER — Ambulatory Visit (INDEPENDENT_AMBULATORY_CARE_PROVIDER_SITE_OTHER): Payer: BLUE CROSS/BLUE SHIELD | Admitting: Family

## 2016-11-11 ENCOUNTER — Encounter: Payer: Self-pay | Admitting: Family

## 2016-11-11 ENCOUNTER — Other Ambulatory Visit: Payer: BLUE CROSS/BLUE SHIELD

## 2016-11-11 DIAGNOSIS — R1030 Lower abdominal pain, unspecified: Secondary | ICD-10-CM

## 2016-11-11 LAB — POC URINALSYSI DIPSTICK (AUTOMATED)
Glucose, UA: NEGATIVE
Leukocytes, UA: NEGATIVE
NITRITE UA: NEGATIVE
PH UA: 5.5
RBC UA: NEGATIVE
Spec Grav, UA: >=1.03
UROBILINOGEN UA: 1

## 2016-11-11 MED ORDER — SULFAMETHOXAZOLE-TRIMETHOPRIM 800-160 MG PO TABS: 1.0000 | ORAL_TABLET | Freq: Two times a day (BID) | ORAL | 0 refills | Status: DC

## 2016-11-11 NOTE — Progress Notes (Signed)
Pre visit review using our clinic review tool, if applicable. No additional management support is needed unless otherwise documented below in the visit note. 

## 2016-11-11 NOTE — Progress Notes (Signed)
Subjective:    Patient ID: [name], female    DOB: February 28, 1946, 70 y.o.   MRN: [id number]  Chief Complaint  Patient presents with  . Abdominal Pain    Left sided abdominal pain and bloating. Sx started on Tuesday. Pt feels like she has been having issues with gluten and admits that she did have a number of biscuits on Tuesday.     HPI:  [name] is a 70 y.o. female who  has a past medical history of Arthritis; Breast cancer ([place]); Bronchitis; Chronic low back pain; Fibromyalgia; Hyperlipemia; Osteoporosis; Pneumonia; and Vitamin D deficiency. and presents today for an acute office visit.  Associated symptom of pain located in her lower left quadrant of her abdomen has been going on for about 4 days. Pain is described as an achy pain. Indicates she has had a fever of 101.6 with the last fever being about 12 hours ago. Denies any nausea, vomiting, constipation or diarrhea. She has a decreased appetite secondary to the pain. Had a small bowel movement this morning. Denies any modifying factors or attempted treatments. Previous history of partial hysterectomy with left ovary remaining. Does have frequency/urgency with no dysuria.   Allergies  Allergen Reactions  . Morphine Hives and Palpitations     Because of a history of documented adverse serious drug reaction;Medi Alert bracelet  is recommended  . Albuterol Palpitations    REACTION: heart racing  . Augmentin [Amoxicillin-Pot Clavulanate]     Abdominal cramping  . Ciprofloxacin     Abdominal cramping  . Fish Allergy Other (See Comments)    Severe headache  . Risedronate Sodium     Gi symptoms  . Ibandronate Sodium     Stomach cramps      Outpatient Medications Prior to Visit  Medication Sig Dispense Refill  . benzonatate (TESSALON) 200 MG capsule One four times daily as needed for cough 40 capsule 2  . fluticasone furoate-vilanterol (BREO ELLIPTA) 100-25 MCG/INH AEPB Inhale 1 puff into the lungs daily. 1  each 1  . gabapentin (NEURONTIN) 100 MG capsule Take 1 capsule (100 mg total) by mouth 3 (three) times daily as needed. 30 capsule 3  . RELPAX 20 MG tablet TAKE 1 TABLET BY MOUTH AT ONSET OF HEADACHE. MAY REPEAT 1 TABLET IN 2 HOURS IF HEADACHE PERSISTS OR RECURS 9 tablet 0  . traZODone (DESYREL) 50 MG tablet TAKE 3 TABLETS(150 MG) BY MOUTH AT BEDTIME 270 tablet 3   No facility-administered medications prior to visit.       Past Surgical History:  Procedure Laterality Date  . ABDOMINAL HYSTERECTOMY  1990   USO for fibroid , Dr Maryruth Eve  . BREAST SURGERY  2010   R side lumpectomy; Dr Margot Chimes  . CATARACT EXTRACTION, BILATERAL  2017  . CESAREAN SECTION      G 2 P 2  . CYSTECTOMY  2010  . EYE SURGERY     eye lid surgery  . no colonoscopy     "I just don't want to do it". SOC reviewed      Past Medical History:  Diagnosis Date  . Arthritis   . Breast cancer ([place])    R lumpectomy ; radiation; no chemotherapy  . Bronchitis    recurrent  . Chronic low back pain    bulging disc & spinal stenosis  . Fibromyalgia   . Hyperlipemia   . Osteoporosis   . Pneumonia    as OP X 2; no Pneumovax  .  Vitamin D deficiency    19 in  01/2009                                                         Review of Systems  Constitutional: Positive for fever. Negative for chills.  Respiratory: Negative for chest tightness and shortness of breath.   Gastrointestinal: Positive for abdominal pain. Negative for blood in stool, constipation, diarrhea, nausea and vomiting.  Genitourinary: Positive for frequency and urgency. Negative for dysuria and hematuria.      Objective:    BP 110/70 (BP Location: Left Arm, Patient Position: Sitting, Cuff Size: Normal)   Pulse 79   Temp 98.5 F (36.9 C) (Oral)   Ht 5\' 5"  (1.651 m)   Wt 124 lb 8 oz (56.5 kg)   SpO2 96%   BMI 20.72 kg/m  Nursing note and vital signs reviewed.  Physical Exam  Constitutional: She is oriented to person, place, and time. She  appears well-developed and well-nourished. No distress.  Cardiovascular: Normal rate, regular rhythm, normal heart sounds and intact distal pulses.   Pulmonary/Chest: Effort normal and breath sounds normal.  Abdominal: Normal appearance and bowel sounds are normal. She exhibits no mass. There is tenderness in the suprapubic area and left lower quadrant. There is no rigidity, no rebound, no guarding, no CVA tenderness, no tenderness at McBurney's point and negative Murphy's sign.  Neurological: She is alert and oriented to person, place, and time.  Skin: Skin is warm and dry.  Psychiatric: She has a normal mood and affect. Her behavior is normal. Judgment and thought content normal.        Assessment & Plan:   Problem List Items Addressed This Visit      Other   Lower abdominal pain    Lower abdominal pain with concern for possibly urinary tract infection, nephrolithiasis, diverticulitis, or ovarian cyst. In office urinalysis negative for leukocytes, nitrites, or hematuria. Urine sent for culture. Treat for urine tract infection with Bactrim. If symptoms worsen order for abdominal CT placed. Over-the-counter medications as needed for symptom relief and supportive care. Follow-up if symptoms worsen or do not improve.      Relevant Medications   sulfamethoxazole-trimethoprim (BACTRIM DS,SEPTRA DS) 800-160 MG tablet   Other Relevant Orders   CT Abdomen Pelvis Wo Contrast   POCT Urinalysis Dipstick (Automated) (Completed)   Urine Culture       I am having [name] start on sulfamethoxazole-trimethoprim. I am also having her maintain her gabapentin, benzonatate, fluticasone furoate-vilanterol, RELPAX, and traZODone.   Meds ordered this encounter  Medications  . sulfamethoxazole-trimethoprim (BACTRIM DS,SEPTRA DS) 800-160 MG tablet    Sig: Take 1 tablet by mouth 2 (two) times daily.    Dispense:  10 tablet    Refill:  0    Order Specific Question:   Supervising Provider    Answer:    Pricilla Holm A [id number]     Follow-up: Return if symptoms worsen or fail to improve.  Mauricio Po, FNP

## 2016-11-11 NOTE — Patient Instructions (Signed)
Thank you for choosing [facility].  SUMMARY AND INSTRUCTIONS:  They will call to schedule your CT scan if needed.   Please start the Bactrim.  We will call with your urine culture results.   Medication:  Tylenol as needed for discomfort.   Your prescription(s) have been submitted to your pharmacy or been printed and provided for you. Please take as directed and contact our office if you believe you are having problem(s) with the medication(s) or have any questions.   Follow up:  If your symptoms worsen or fail to improve, please contact our office for further instruction, or in case of emergency go directly to the emergency room at the closest medical facility.    Diverticulitis Diverticulitis is inflammation or infection of small pouches in your colon that form when you have a condition called diverticulosis. The pouches in your colon are called diverticula. Your colon, or large intestine, is where water is absorbed and stool is formed. Complications of diverticulitis can include:  Bleeding.  Severe infection.  Severe pain.  Perforation of your colon.  Obstruction of your colon. What are the causes? Diverticulitis is caused by bacteria. Diverticulitis happens when stool becomes trapped in diverticula. This allows bacteria to grow in the diverticula, which can lead to inflammation and infection. What increases the risk? People with diverticulosis are at risk for diverticulitis. Eating a diet that does not include enough fiber from fruits and vegetables may make diverticulitis more likely to develop. What are the signs or symptoms? Symptoms of diverticulitis may include:  Abdominal pain and tenderness. The pain is normally located on the left side of the abdomen, but may occur in other areas.  Fever and chills.  Bloating.  Cramping.  Nausea.  Vomiting.  Constipation.  Diarrhea.  Blood in your stool. How is this diagnosed? Your health care provider  will ask you about your medical history and do a physical exam. You may need to have tests done because many medical conditions can cause the same symptoms as diverticulitis. Tests may include:  Blood tests.  Urine tests.  Imaging tests of the abdomen, including X-rays and CT scans. When your condition is under control, your health care provider may recommend that you have a colonoscopy. A colonoscopy can show how severe your diverticula are and whether something else is causing your symptoms. How is this treated? Most cases of diverticulitis are mild and can be treated at home. Treatment may include:  Taking over-the-counter pain medicines.  Following a clear liquid diet.  Taking antibiotic medicines by mouth for 7-10 days. More severe cases may be treated at a hospital. Treatment may include:  Not eating or drinking.  Taking prescription pain medicine.  Receiving antibiotic medicines through an IV tube.  Receiving fluids and nutrition through an IV tube.  Surgery. Follow these instructions at home:  Follow your health care provider's instructions carefully.  Follow a full liquid diet or other diet as directed by your health care provider. After your symptoms improve, your health care provider may tell you to change your diet. He or she may recommend you eat a high-fiber diet. Fruits and vegetables are good sources of fiber. Fiber makes it easier to pass stool.  Take fiber supplements or probiotics as directed by your health care provider.  Only take medicines as directed by your health care provider.  Keep all your follow-up appointments. Contact a health care provider if:  Your pain does not improve.  You have a hard time eating food.  Your bowel movements do not return to normal. Get help right away if:  Your pain becomes worse.  Your symptoms do not get better.  Your symptoms suddenly get worse.  You have a fever.  You have repeated vomiting.  You have  bloody or black, tarry stools. This information is not intended to replace advice given to you by your health care provider. Make sure you discuss any questions you have with your health care provider. Document Released: 09/14/2005 Document Revised: 05/12/2016 Document Reviewed: 10/30/2013 Elsevier Interactive Patient Education  2017 [place].   Ovarian Cyst  An ovarian cyst is a fluid-filled sac that forms on an ovary. The ovaries are small organs that produce eggs in women. Various types of cysts can form on the ovaries. Some may cause symptoms and require treatment. Most ovarian cysts go away on their own, are not cancerous (are benign), and do not cause problems. Common types of ovarian cysts include:  Functional (follicle) cysts.  Occur during the menstrual cycle, and usually go away with the next menstrual cycle if you do not get pregnant.  Usually cause no symptoms.  Endometriomas.  Are cysts that form from the tissue that lines the uterus (endometrium).  Are sometimes called "chocolate cysts" because they become filled with blood that turns brown.  Can cause pain in the lower abdomen during intercourse and during your period.  Cystadenoma cysts.  Develop from cells on the outside surface of the ovary.  Can get very large and cause lower abdomen pain and pain with intercourse.  Can cause severe pain if they twist or break open (rupture).  Dermoid cysts.  Are sometimes found in both ovaries.  May contain different kinds of body tissue, such as skin, teeth, hair, or cartilage.  Usually do not cause symptoms unless they get very big.  Theca lutein cysts.  Occur when too much of a certain hormone (human chorionic gonadotropin) is produced and overstimulates the ovaries to produce an egg.  Are most common after having procedures used to assist with the conception of a baby (in vitro fertilization). What are the causes? Ovarian cysts may be caused by:  Ovarian  hyperstimulation syndrome. This is a condition that can develop from taking fertility medicines. It causes multiple large ovarian cysts to form.  Polycystic ovarian syndrome (PCOS). This is a common hormonal disorder that can cause ovarian cysts, as well as problems with your period or fertility. What increases the risk? The following factors may make you more likely to develop ovarian cysts:  Being overweight or obese.  Taking fertility medicines.  Taking certain forms of hormonal birth control.  Smoking. What are the signs or symptoms? Many ovarian cysts do not cause symptoms. If symptoms are present, they may include:  Pelvic pain or pressure.  Pain in the lower abdomen.  Pain during sex.  Abdominal swelling.  Abnormal menstrual periods.  Increasing pain with menstrual periods. How is this diagnosed? These cysts are commonly found during a routine pelvic exam. You may have tests to find out more about the cyst, such as:  Ultrasound.  X-ray of the pelvis.  CT scan.  MRI.  Blood tests. How is this treated? Many ovarian cysts go away on their own without treatment. Your health care provider may want to check your cyst regularly for 2-3 months to see if it changes. If you are in menopause, it is especially important to have your cyst monitored closely because menopausal women have a higher rate of ovarian cancer.  When treatment is needed, it may include:  Medicines to help relieve pain.  A procedure to drain the cyst (aspiration).  Surgery to remove the whole cyst.  Hormone treatment or birth control pills. These methods are sometimes used to help dissolve a cyst. Follow these instructions at home:  Take over-the-counter and prescription medicines only as told by your health care provider.  Do not drive or use heavy machinery while taking prescription pain medicine.  Get regular pelvic exams and Pap tests as often as told by your health care provider.  Return  to your normal activities as told by your health care provider. Ask your health care provider what activities are safe for you.  Do not use any products that contain nicotine or tobacco, such as cigarettes and e-cigarettes. If you need help quitting, ask your health care provider.  Keep all follow-up visits as told by your health care provider. This is important. Contact a health care provider if:  Your periods are late, irregular, or painful, or they stop.  You have pelvic pain that does not go away.  You have pressure on your bladder or trouble emptying your bladder completely.  You have pain during sex.  You have any of the following in your abdomen:  A feeling of fullness.  Pressure.  Discomfort.  Pain that does not go away.  Swelling.  You feel generally ill.  You become constipated.  You lose your appetite.  You develop severe acne.  You start to have more body hair and facial hair.  You are gaining weight or losing weight without changing your exercise and eating habits.  You think you may be pregnant. Get help right away if:  You have abdominal pain that is severe or gets worse.  You cannot eat or drink without vomiting.  You suddenly develop a fever.  Your menstrual period is much heavier than usual. This information is not intended to replace advice given to you by your health care provider. Make sure you discuss any questions you have with your health care provider. Document Released: 12/05/2005 Document Revised: 06/24/2016 Document Reviewed: 05/08/2016 Elsevier Interactive Patient Education  2017 [employer].

## 2016-11-11 NOTE — Assessment & Plan Note (Signed)
Lower abdominal pain with concern for possibly urinary tract infection, nephrolithiasis, diverticulitis, or ovarian cyst. In office urinalysis negative for leukocytes, nitrites, or hematuria. Urine sent for culture. Treat for urine tract infection with Bactrim. If symptoms worsen order for abdominal CT placed. Over-the-counter medications as needed for symptom relief and supportive care. Follow-up if symptoms worsen or do not improve.

## 2016-11-12 LAB — URINE CULTURE

## 2016-11-14 ENCOUNTER — Encounter: Payer: Self-pay | Admitting: Family

## 2016-11-17 ENCOUNTER — Ambulatory Visit (INDEPENDENT_AMBULATORY_CARE_PROVIDER_SITE_OTHER)
Admission: RE | Admit: 2016-11-17 | Discharge: 2016-11-17 | Disposition: A | Discharge status: Home / Self Care | Payer: BLUE CROSS/BLUE SHIELD | Source: Ambulatory Visit | Attending: Internal Medicine | Admitting: Internal Medicine

## 2016-11-17 DIAGNOSIS — M816 Localized osteoporosis [Lequesne]: Secondary | ICD-10-CM | POA: Diagnosis not present

## 2016-11-17 DIAGNOSIS — R05 Cough: Secondary | ICD-10-CM | POA: Diagnosis not present

## 2016-11-17 DIAGNOSIS — R059 Cough, unspecified: Secondary | ICD-10-CM

## 2016-11-18 ENCOUNTER — Other Ambulatory Visit: Payer: BLUE CROSS/BLUE SHIELD

## 2016-11-20 DIAGNOSIS — M816 Localized osteoporosis [Lequesne]: Secondary | ICD-10-CM | POA: Diagnosis not present

## 2016-11-24 ENCOUNTER — Encounter: Payer: Self-pay | Admitting: Internal Medicine

## 2017-05-03 ENCOUNTER — Other Ambulatory Visit: Payer: Self-pay | Admitting: *Deleted

## 2017-05-03 MED ORDER — GABAPENTIN 100 MG PO CAPS: 100.0000 mg | ORAL_CAPSULE | Freq: Three times a day (TID) | ORAL | 0 refills | Status: DC | PRN

## 2017-09-04 ENCOUNTER — Ambulatory Visit (INDEPENDENT_AMBULATORY_CARE_PROVIDER_SITE_OTHER): Payer: Self-pay | Admitting: Orthopaedic Surgery

## 2017-10-09 DIAGNOSIS — H04123 Dry eye syndrome of bilateral lacrimal glands: Secondary | ICD-10-CM | POA: Diagnosis not present

## 2017-10-11 ENCOUNTER — Ambulatory Visit (INDEPENDENT_AMBULATORY_CARE_PROVIDER_SITE_OTHER): Payer: BLUE CROSS/BLUE SHIELD | Admitting: Internal Medicine

## 2017-10-11 ENCOUNTER — Other Ambulatory Visit (INDEPENDENT_AMBULATORY_CARE_PROVIDER_SITE_OTHER): Payer: BLUE CROSS/BLUE SHIELD

## 2017-10-11 ENCOUNTER — Encounter: Payer: Self-pay | Admitting: Internal Medicine

## 2017-10-11 VITALS — BP 134/86 | HR 63 | Temp 97.7°F | Resp 16 | Ht 65.0 in | Wt 127.0 lb

## 2017-10-11 DIAGNOSIS — Z0000 Encounter for general adult medical examination without abnormal findings: Secondary | ICD-10-CM

## 2017-10-11 DIAGNOSIS — G43909 Migraine, unspecified, not intractable, without status migrainosus: Secondary | ICD-10-CM

## 2017-10-11 DIAGNOSIS — M48061 Spinal stenosis, lumbar region without neurogenic claudication: Secondary | ICD-10-CM

## 2017-10-11 DIAGNOSIS — I890 Lymphedema, not elsewhere classified: Secondary | ICD-10-CM | POA: Diagnosis not present

## 2017-10-11 DIAGNOSIS — M816 Localized osteoporosis [Lequesne]: Secondary | ICD-10-CM

## 2017-10-11 DIAGNOSIS — R079 Chest pain, unspecified: Secondary | ICD-10-CM | POA: Diagnosis not present

## 2017-10-11 DIAGNOSIS — R002 Palpitations: Secondary | ICD-10-CM | POA: Insufficient documentation

## 2017-10-11 LAB — TSH: TSH: 1.39 u[IU]/mL (ref 0.35–4.50)

## 2017-10-11 LAB — COMPREHENSIVE METABOLIC PANEL
ALK PHOS: 56 U/L (ref 39–117)
ALT: 12 U/L (ref 0–35)
AST: 14 U/L (ref 0–37)
Albumin: 4.5 g/dL (ref 3.5–5.2)
BUN: 14 mg/dL (ref 6–23)
CHLORIDE: 102 meq/L (ref 96–112)
CO2: 27 mEq/L (ref 19–32)
CREATININE: 0.9 mg/dL (ref 0.40–1.20)
Calcium: 9.4 mg/dL (ref 8.4–10.5)
GFR: 65.67 mL/min (ref >60.00)
Glucose, Bld: 93 mg/dL (ref 70–99)
Potassium: 4.2 mEq/L (ref 3.5–5.1)
SODIUM: 138 meq/L (ref 135–145)
Total Bilirubin: 0.7 mg/dL (ref 0.2–1.2)
Total Protein: 7.1 g/dL (ref 6.0–8.3)

## 2017-10-11 LAB — LIPID PANEL
CHOL/HDL RATIO: 3
Cholesterol: 239 mg/dL — ABNORMAL HIGH (ref 0–200)
HDL: 70.4 mg/dL (ref >39.00)
LDL CALC: 155 mg/dL — AB (ref 0–99)
NonHDL: 168.86
Triglycerides: 69 mg/dL (ref 0.0–149.0)
VLDL: 13.8 mg/dL (ref 0.0–40.0)

## 2017-10-11 LAB — CBC WITH DIFFERENTIAL/PLATELET
Basophils Absolute: 0.1 10*3/uL (ref 0.0–0.1)
Basophils Relative: 1 % (ref 0.0–3.0)
Eosinophils Absolute: 0.1 10*3/uL (ref 0.0–0.7)
Eosinophils Relative: 2.2 % (ref 0.0–5.0)
HEMATOCRIT: 40.1 % (ref 36.0–46.0)
Hemoglobin: 13.2 g/dL (ref 12.0–15.0)
LYMPHS PCT: 26.2 % (ref 12.0–46.0)
Lymphs Abs: 1.5 10*3/uL (ref 0.7–4.0)
MCHC: 33 g/dL (ref 30.0–36.0)
MCV: 93.1 fl (ref 78.0–100.0)
Monocytes Absolute: 0.4 10*3/uL (ref 0.1–1.0)
Monocytes Relative: 7.3 % (ref 3.0–12.0)
NEUTROS PCT: 63.3 % (ref 43.0–77.0)
Neutro Abs: 3.5 10*3/uL (ref 1.4–7.7)
PLATELETS: 281 10*3/uL (ref 150.0–400.0)
RBC: 4.3 Mil/uL (ref 3.87–5.11)
RDW: 13.4 % (ref 11.5–15.5)
WBC: 5.5 10*3/uL (ref 4.0–10.5)

## 2017-10-11 LAB — VITAMIN D 25 HYDROXY (VIT D DEFICIENCY, FRACTURES): VITD: 24.35 ng/mL — ABNORMAL LOW (ref 30.00–100.00)

## 2017-10-11 NOTE — Assessment & Plan Note (Signed)
Chronic pain, severe pain 2/year Takes gabapentin only as needed

## 2017-10-11 NOTE — Assessment & Plan Note (Addendum)
Bone density done 2017, next due 2019 Has not tolerated bisphosphonates Walking for exercise, does fitness class twice a week We will check vitamin D level - not taking vitamin d Encouraged high calcium diet since she is not taking supplementation  Does not to try medication

## 2017-10-11 NOTE — Assessment & Plan Note (Signed)
She has had a few episodes of chest pain - does not sound cardiac in nature and she is low risk  EKG today is normal - NSR at 61 bpm, normal EKG We will just monitor for now - if increases in frequency she will let me know and will need to do a stress test

## 2017-10-11 NOTE — Patient Instructions (Addendum)
Test(s) ordered today. Your results will be released to MyChart (or called to you) after review, usually within 72hours after test completion. If any changes need to be made, you will be notified at that same time.  All other Health Maintenance issues reviewed.   All recommended immunizations and age-appropriate screenings are up-to-date or discussed.  No immunizations administered today.   Medications reviewed and updated.  No changes recommended at this time.  Your prescription(s) have been submitted to your pharmacy. Please take as directed and contact our office if you believe you are having problem(s) with the medication(s).   Please followup in one year   Health Maintenance, Female Adopting a healthy lifestyle and getting preventive care can go a long way to promote health and wellness. Talk with your health care provider about what schedule of regular examinations is right for you. This is a good chance for you to check in with your provider about disease prevention and staying healthy. In between checkups, there are plenty of things you can do on your own. Experts have done a lot of research about which lifestyle changes and preventive measures are most likely to keep you healthy. Ask your health care provider for more information. Weight and diet Eat a healthy diet  Be sure to include plenty of vegetables, fruits, low-fat dairy products, and lean protein.  Do not eat a lot of foods high in solid fats, added sugars, or salt.  Get regular exercise. This is one of the most important things you can do for your health. ? Most adults should exercise for at least 150 minutes each week. The exercise should increase your heart rate and make you sweat (moderate-intensity exercise). ? Most adults should also do strengthening exercises at least twice a week. This is in addition to the moderate-intensity exercise.  Maintain a healthy weight  Body mass index (BMI) is a measurement that can  be used to identify possible weight problems. It estimates body fat based on height and weight. Your health care provider can help determine your BMI and help you achieve or maintain a healthy weight.  For females 20 years of age and older: ? A BMI below 18.5 is considered underweight. ? A BMI of 18.5 to 24.9 is normal. ? A BMI of 25 to 29.9 is considered overweight. ? A BMI of 30 and above is considered obese.  Watch levels of cholesterol and blood lipids  You should start having your blood tested for lipids and cholesterol at 70 years of age, then have this test every 5 years.  You may need to have your cholesterol levels checked more often if: ? Your lipid or cholesterol levels are high. ? You are older than 70 years of age. ? You are at high risk for heart disease.  Cancer screening Lung Cancer  Lung cancer screening is recommended for adults 55-80 years old who are at high risk for lung cancer because of a history of smoking.  A yearly low-dose CT scan of the lungs is recommended for people who: ? Currently smoke. ? Have quit within the past 15 years. ? Have at least a 30-pack-year history of smoking. A pack year is smoking an average of one pack of cigarettes a day for 1 year.  Yearly screening should continue until it has been 15 years since you quit.  Yearly screening should stop if you develop a health problem that would prevent you from having lung cancer treatment.  Breast Cancer  Practice breast self-awareness.   This means understanding how your breasts normally appear and feel.  It also means doing regular breast self-exams. Let your health care provider know about any changes, no matter how small.  If you are in your 20s or 30s, you should have a clinical breast exam (CBE) by a health care provider every 1-3 years as part of a regular health exam.  If you are 40 or older, have a CBE every year. Also consider having a breast X-ray (mammogram) every year.  If you  have a family history of breast cancer, talk to your health care provider about genetic screening.  If you are at high risk for breast cancer, talk to your health care provider about having an MRI and a mammogram every year.  Breast cancer gene (BRCA) assessment is recommended for women who have family members with BRCA-related cancers. BRCA-related cancers include: ? Breast. ? Ovarian. ? Tubal. ? Peritoneal cancers.  Results of the assessment will determine the need for genetic counseling and BRCA1 and BRCA2 testing.  Cervical Cancer Your health care provider may recommend that you be screened regularly for cancer of the pelvic organs (ovaries, uterus, and vagina). This screening involves a pelvic examination, including checking for microscopic changes to the surface of your cervix (Pap test). You may be encouraged to have this screening done every 3 years, beginning at age 21.  For women ages 30-65, health care providers may recommend pelvic exams and Pap testing every 3 years, or they may recommend the Pap and pelvic exam, combined with testing for human papilloma virus (HPV), every 5 years. Some types of HPV increase your risk of cervical cancer. Testing for HPV may also be done on women of any age with unclear Pap test results.  Other health care providers may not recommend any screening for nonpregnant women who are considered low risk for pelvic cancer and who do not have symptoms. Ask your health care provider if a screening pelvic exam is right for you.  If you have had past treatment for cervical cancer or a condition that could lead to cancer, you need Pap tests and screening for cancer for at least 20 years after your treatment. If Pap tests have been discontinued, your risk factors (such as having a new sexual partner) need to be reassessed to determine if screening should resume. Some women have medical problems that increase the chance of getting cervical cancer. In these cases,  your health care provider may recommend more frequent screening and Pap tests.  Colorectal Cancer  This type of cancer can be detected and often prevented.  Routine colorectal cancer screening usually begins at 70 years of age and continues through 70 years of age.  Your health care provider may recommend screening at an earlier age if you have risk factors for colon cancer.  Your health care provider may also recommend using home test kits to check for hidden blood in the stool.  A small camera at the end of a tube can be used to examine your colon directly (sigmoidoscopy or colonoscopy). This is done to check for the earliest forms of colorectal cancer.  Routine screening usually begins at age 50.  Direct examination of the colon should be repeated every 5-10 years through 70 years of age. However, you may need to be screened more often if early forms of precancerous polyps or small growths are found.  Skin Cancer  Check your skin from head to toe regularly.  Tell your health care provider about any   any new moles or changes in moles, especially if there is a change in a mole's shape or color.  Also tell your health care provider if you have a mole that is larger than the size of a pencil eraser.  Always use sunscreen. Apply sunscreen liberally and repeatedly throughout the day.  Protect yourself by wearing long sleeves, pants, a wide-brimmed hat, and sunglasses whenever you are outside.  Heart disease, diabetes, and high blood pressure  High blood pressure causes heart disease and increases the risk of stroke. High blood pressure is more likely to develop in: ? People who have blood pressure in the high end of the normal range (130-139/85-89 mm Hg). ? People who are overweight or obese. ? People who are African American.  If you are 38-35 years of age, have your blood pressure checked every 3-5 years. If you are 37 years of age or older, have your blood pressure checked every year.  You should have your blood pressure measured twice-once when you are at a hospital or clinic, and once when you are not at a hospital or clinic. Record the average of the two measurements. To check your blood pressure when you are not at a hospital or clinic, you can use: ? An automated blood pressure machine at a pharmacy. ? A home blood pressure monitor.  If you are between 45 years and 85 years old, ask your health care provider if you should take aspirin to prevent strokes.  Have regular diabetes screenings. This involves taking a blood sample to check your fasting blood sugar level. ? If you are at a normal weight and have a low risk for diabetes, have this test once every three years after 70 years of age. ? If you are overweight and have a high risk for diabetes, consider being tested at a younger age or more often. Preventing infection Hepatitis B  If you have a higher risk for hepatitis B, you should be screened for this virus. You are considered at high risk for hepatitis B if: ? You were born in a country where hepatitis B is common. Ask your health care provider which countries are considered high risk. ? Your parents were born in a high-risk country, and you have not been immunized against hepatitis B (hepatitis B vaccine). ? You have HIV or AIDS. ? You use needles to inject street drugs. ? You live with someone who has hepatitis B. ? You have had sex with someone who has hepatitis B. ? You get hemodialysis treatment. ? You take certain medicines for conditions, including cancer, organ transplantation, and autoimmune conditions.  Hepatitis C  Blood testing is recommended for: ? Everyone born from [birth year] through 1964. ? Anyone with known risk factors for hepatitis C.  Sexually transmitted infections (STIs)  You should be screened for sexually transmitted infections (STIs) including gonorrhea and chlamydia if: ? You are sexually active and are younger than 70 years of  age. ? You are older than 70 years of age and your health care provider tells you that you are at risk for this type of infection. ? Your sexual activity has changed since you were last screened and you are at an increased risk for chlamydia or gonorrhea. Ask your health care provider if you are at risk.  If you do not have HIV, but are at risk, it may be recommended that you take a prescription medicine daily to prevent HIV infection. This is called pre-exposure prophylaxis (PrEP). You are considered  at risk if: ? You are sexually active and do not regularly use condoms or know the HIV status of your partner(s). ? You take drugs by injection. ? You are sexually active with a partner who has HIV.  Talk with your health care provider about whether you are at high risk of being infected with HIV. If you choose to begin PrEP, you should first be tested for HIV. You should then be tested every 3 months for as long as you are taking PrEP. Pregnancy  If you are premenopausal and you may become pregnant, ask your health care provider about preconception counseling.  If you may become pregnant, take 400 to 800 micrograms (mcg) of folic acid every day.  If you want to prevent pregnancy, talk to your health care provider about birth control (contraception). Osteoporosis and menopause  Osteoporosis is a disease in which the bones lose minerals and strength with aging. This can result in serious bone fractures. Your risk for osteoporosis can be identified using a bone density scan.  If you are 6 years of age or older, or if you are at risk for osteoporosis and fractures, ask your health care provider if you should be screened.  Ask your health care provider whether you should take a calcium or vitamin D supplement to lower your risk for osteoporosis.  Menopause may have certain physical symptoms and risks.  Hormone replacement therapy may reduce some of these symptoms and risks. Talk to your health  care provider about whether hormone replacement therapy is right for you. Follow these instructions at home:  Schedule regular health, dental, and eye exams.  Stay current with your immunizations.  Do not use any tobacco products including cigarettes, chewing tobacco, or electronic cigarettes.  If you are pregnant, do not drink alcohol.  If you are breastfeeding, limit how much and how often you drink alcohol.  Limit alcohol intake to no more than 1 drink per day for nonpregnant women. One drink equals 12 ounces of beer, 5 ounces of wine, or 1 ounces of hard liquor.  Do not use street drugs.  Do not share needles.  Ask your health care provider for help if you need support or information about quitting drugs.  Tell your health care provider if you often feel depressed.  Tell your health care provider if you have ever been abused or do not feel safe at home. This information is not intended to replace advice given to you by your health care provider. Make sure you discuss any questions you have with your health care provider. Document Released: 06/20/2011 Document Revised: 05/12/2016 Document Reviewed: 09/08/2015 Elsevier Interactive Patient Education  [employer].

## 2017-10-11 NOTE — Assessment & Plan Note (Signed)
Infrequent Relpax effective-takes as needed Continue

## 2017-10-11 NOTE — Assessment & Plan Note (Signed)
Has intermittent palpitations, but this is not new and there has been no change EKG today is normal - NSR at 61 bpm, normal EKG - no change from prior from 2014

## 2017-10-11 NOTE — Progress Notes (Signed)
Subjective:    Patient ID: [name], female    DOB: 05-26-1946, 71 y.o.   MRN: [id number]  HPI She is here for a physical exam.   She has had palps and flip-flops of her heart.  Has had it for years.  She denies any recent change.  She denies any associated symptoms, such as sob, lightheadedness or chest pain with the palpitations.  There is no relation to food or caffeine.     She has had a couple of episodes of strong chest pain.  It lasts about a few minutes and then goes away.  The last episode occurred at rest when she was in bed.  She did not think it was anything concerning and did not see anyone at the time.  There has been no increase in intensity or frequency.    Tries to exercise 2/week, walking - but has not been doing it as regularly.  Medications and allergies reviewed with patient and updated if appropriate.  Patient Active Problem List   Diagnosis Date Noted  . Lower abdominal pain 11/11/2016  . Migraine 12/30/2015  . Upper airway cough syndrome 10/29/2015  . Osteoporosis 06/06/2011  . Vitamin D deficiency 03/22/2010  . BREAST CANCER, HX OF 03/22/2010  . SPINAL STENOSIS, LUMBAR 03/25/2008  . HYPERLIPIDEMIA 09/28/2007  . LOW BACK PAIN 05/24/2007  . Fibromyalgia 05/24/2007    Current Outpatient Prescriptions on File Prior to Visit  Medication Sig Dispense Refill  . benzonatate (TESSALON) 200 MG capsule One four times daily as needed for cough 40 capsule 2  . fluticasone furoate-vilanterol (BREO ELLIPTA) 100-25 MCG/INH AEPB Inhale 1 puff into the lungs daily. 1 each 1  . gabapentin (NEURONTIN) 100 MG capsule Take 1 capsule (100 mg total) by mouth 3 (three) times daily as needed. 30 capsule 0  . RELPAX 20 MG tablet TAKE 1 TABLET BY MOUTH AT ONSET OF HEADACHE. MAY REPEAT 1 TABLET IN 2 HOURS IF HEADACHE PERSISTS OR RECURS 9 tablet 0  . traZODone (DESYREL) 50 MG tablet TAKE 3 TABLETS(150 MG) BY MOUTH AT BEDTIME 270 tablet 3   No current facility-administered  medications on file prior to visit.     Past Medical History:  Diagnosis Date  . Arthritis   . Breast cancer ([place])    R lumpectomy ; radiation; no chemotherapy  . Bronchitis    recurrent  . Chronic low back pain    bulging disc & spinal stenosis  . Fibromyalgia   . Hyperlipemia   . Osteoporosis   . Pneumonia    as OP X 2; no Pneumovax  . Vitamin D deficiency    19 in  01/2009    Past Surgical History:  Procedure Laterality Date  . ABDOMINAL HYSTERECTOMY  1990   [facility] for fibroid , Dr Maryruth Eve  . BREAST SURGERY  2010   R side lumpectomy; Dr Margot Chimes  . CATARACT EXTRACTION, BILATERAL  2017  . CESAREAN SECTION      G 2 P 2  . CYSTECTOMY  2010  . EYE SURGERY     eye lid surgery  . no colonoscopy     "I just don't want to do it". SOC reviewed    Social History   Social History  . Marital status: Married    Spouse name: N/A  . Number of children: N/A  . Years of education: N/A   Social History Main Topics  . Smoking status: Former Smoker    Quit date: 12/19/1970  .  Smokeless tobacco: Never Used     Comment: [phone], up to 3 cigarettes / day  . Alcohol use No  . Drug use: No  . Sexual activity: Not Asked   Other Topics Concern  . None   Social History Narrative  . None    Family History  Problem Relation Age of Onset  . Atrial fibrillation Mother        S/P cardioversion  . Ulcers Mother   . Hypertension Mother   . Dementia Mother   . Heart attack Father 30       died @ 45  . Heart attack Paternal Aunt        < 37  . Heart attack Paternal Grandfather        > 55  . Heart attack Paternal Uncle        > 55  . Heart attack Paternal Grandmother        < 79  . Hypertension Brother   . Thyroid disease Daughter        on Synthroid since high school  . Diabetes Brother   . Stroke Neg Hx     Review of Systems  Constitutional: Negative for chills and fever.  Eyes: Negative for visual disturbance.  Respiratory: Negative for cough, shortness of breath  and wheezing.   Cardiovascular: Positive for chest pain, palpitations and leg swelling (left ankle - mild).       Lymphedema of arm  Gastrointestinal: Negative for abdominal pain, blood in stool, constipation, diarrhea and nausea.  Genitourinary: Negative for dysuria and hematuria.  Musculoskeletal: Positive for back pain (chronic). Negative for arthralgias.  Skin: Negative for color change and rash.  Neurological: Positive for headaches. Negative for dizziness and light-headedness.  Psychiatric/Behavioral: Negative for dysphoric mood and sleep disturbance (controlled). The patient is not nervous/anxious.        Objective:   Vitals:   10/11/17 1413  BP: 134/86  Pulse: 63  Resp: 16  Temp: 97.7 F (36.5 C)  SpO2: 97%   Filed Weights   10/11/17 1413  Weight: 127 lb (57.6 kg)   Body mass index is 21.13 kg/m.  Wt Readings from Last 3 Encounters:  10/11/17 127 lb (57.6 kg)  11/11/16 124 lb 8 oz (56.5 kg)  10/03/16 126 lb (57.2 kg)     Physical Exam Constitutional: She appears well-developed and well-nourished. No distress.  HENT:  Head: Normocephalic and atraumatic.  Right Ear: External ear normal. Normal ear canal and TM Left Ear: External ear normal.  Normal ear canal and TM Mouth/Throat: Oropharynx is clear and moist.  Eyes: Conjunctivae and EOM are normal.  Neck: Neck supple. No tracheal deviation present. No thyromegaly present.  No carotid bruit  Cardiovascular: Normal rate, regular rhythm and normal heart sounds.   No murmur heard.  No edema. Pulmonary/Chest: Effort normal and breath sounds normal. No respiratory distress. She has no wheezes. She has no rales.  Breast: deferred to Gyn Abdominal: Soft. She exhibits no distension. There is no tenderness.  Lymphadenopathy: She has no cervical adenopathy.  Skin: Skin is warm and dry. She is not diaphoretic.  Psychiatric: She has a normal mood and affect. Her behavior is normal.        Assessment & Plan:    Physical exam: Screening blood work ordered Immunizations flu vaccine, tetanus, new shingles vaccine discussed Colonoscopy   - never had one - deferred colonoscopy and cologuard Mammogram   up-to-date Gyn  No longer seeing  Dexa  up-to-date Eye exams   Up to date  EKG    last done September 2014, repeat today for palpitations Exercise walking, fitness class twice a week Weight  BMI normal Skin   No concerns Substance abuse   none  See Problem List for Assessment and Plan of chronic medical problems.   FU annually

## 2017-10-12 ENCOUNTER — Encounter: Payer: Self-pay | Admitting: Internal Medicine

## 2017-10-16 ENCOUNTER — Ambulatory Visit: Payer: BLUE CROSS/BLUE SHIELD | Attending: General Surgery | Admitting: Physical Therapy

## 2017-10-16 ENCOUNTER — Encounter: Payer: Self-pay | Admitting: Physical Therapy

## 2017-10-16 DIAGNOSIS — I890 Lymphedema, not elsewhere classified: Secondary | ICD-10-CM | POA: Insufficient documentation

## 2017-10-16 DIAGNOSIS — M6281 Muscle weakness (generalized): Secondary | ICD-10-CM | POA: Insufficient documentation

## 2017-10-16 DIAGNOSIS — M25611 Stiffness of right shoulder, not elsewhere classified: Secondary | ICD-10-CM | POA: Diagnosis not present

## 2017-10-16 DIAGNOSIS — M25511 Pain in right shoulder: Secondary | ICD-10-CM | POA: Diagnosis not present

## 2017-10-16 DIAGNOSIS — R293 Abnormal posture: Secondary | ICD-10-CM | POA: Insufficient documentation

## 2017-10-16 NOTE — Therapy (Signed)
[place], [place], [address] Phone: [phone]   Fax:  [phone]  Physical Therapy Evaluation  Patient Details  Name: [name] MRN: [id number] Date of Birth: [birth year] Referring Provider: Barry Dienes  Encounter Date: 10/16/2017      PT End of Session - 10/16/17 1216    Visit Number 1   Number of Visits 9   Date for PT Re-Evaluation 11/13/17   PT Start Time 1018   PT Stop Time 1100   PT Time Calculation (min) 42 min   Activity Tolerance Patient tolerated treatment well   Behavior During Therapy [facility] for tasks assessed/performed      Past Medical History:  Diagnosis Date  . Arthritis   . Breast cancer ([place])    R lumpectomy ; radiation; no chemotherapy  . Bronchitis    recurrent  . Chronic low back pain    bulging disc & spinal stenosis  . Fibromyalgia   . Hyperlipemia   . Osteoporosis   . Pneumonia    as OP X 2; no Pneumovax  . Vitamin D deficiency    19 in  01/2009    Past Surgical History:  Procedure Laterality Date  . ABDOMINAL HYSTERECTOMY  1990   USO for fibroid , Dr Maryruth Eve  . BREAST SURGERY  2010   R side lumpectomy; Dr Margot Chimes  . CATARACT EXTRACTION, BILATERAL  2017  . CESAREAN SECTION      G 2 P 2  . CYSTECTOMY  2010  . EYE SURGERY     eye lid surgery  . no colonoscopy     "I just don't want to do it". SOC reviewed    There were no vitals filed for this visit.       Subjective Assessment - 10/16/17 1025    Subjective My swelling has gotten worse. I can not tolerate a compression bra. I tried the foam from last visit but I could not tolerate that. I can wear a regular bra but I can not wear anything tight or confining. I tried the swell spots but that hurt as well. I try to do the masssage at least 5-6 days a week. I probably spend 15 min doing that. I can not tell a difference with the massage. It seems like I had more improvement when you did the massage  compared to when I did the massage. My right shoulder is irritated when I try to do the massage. I also have a sore spot in my chest when I do it.    Pertinent History Pt with history of right breast cancer s/p lumpectomy in 2010. Pt completed radiation therapy in 2011 and has been having edema in right breast since 2012   Patient Stated Goals for the swelling to go down   Currently in Pain? Yes   Pain Score 1    Pain Location Shoulder   Pain Orientation Right   Pain Descriptors / Indicators Aching;Dull   Pain Type Chronic pain   Pain Radiating Towards down right arm   Pain Onset Other (comment)  pt reports it has been there for years   Aggravating Factors  using weight that is too heavy in exercise class, lifting things that are heavy, doing the self lymphedema massage   Pain Relieving Factors doing nothing   Effect of Pain on Daily Activities effects ability to perform self drainage, ability to participate in exercise class and progress weights  [facility] PT Assessment - 10/16/17 0001      Assessment   Medical Diagnosis right breast cancer   Referring Provider Byerly   Onset Date/Surgical Date 10/19/09   Hand Dominance Right   Prior Therapy lymphedema therapy 2 years ago, 1 time lymph visit in 2017     Precautions   Precautions Other (comment)  lymphedema     Restrictions   Weight Bearing Restrictions No     Balance Screen   Has the patient fallen in the past 6 months No   Has the patient had a decrease in activity level because of a fear of falling?  No   Is the patient reluctant to leave their home because of a fear of falling?  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, pt does cooking   Leisure gentle fitness class 2x/wk, tries to walk several times a week     Cognition   Overall Cognitive Status Within Functional Limits for tasks assessed     Observation/Other Assessments   Other Surveys  --  LLIS: 26% impairment     ROM / Strength   AROM / PROM / Strength Strength     AROM   AROM Assessment Site Shoulder   Right/Left Shoulder Right;Left   Right Shoulder Flexion 161 Degrees  uncomfortable   Right Shoulder ABduction 130 Degrees  with tightness   Right Shoulder Internal Rotation 75 Degrees  very uncomfortable   Right Shoulder External Rotation 90 Degrees  some discomfort   Left Shoulder Flexion 162 Degrees   Left Shoulder ABduction 170 Degrees   Left Shoulder Internal Rotation 78 Degrees   Left Shoulder External Rotation 90 Degrees     Strength   Overall Strength Within functional limits for tasks performed           LYMPHEDEMA/ONCOLOGY QUESTIONNAIRE - 10/16/17 1044      Type   Cancer Type right breast cancer     Surgeries   Lumpectomy Date 10/19/09   Sentinel Lymph Node Biopsy Date 10/19/09   Number Lymph Nodes Removed --  pt states small number of lymph nodes     Date Lymphedema/Swelling Started   Date 10/20/11     Treatment   Active Chemotherapy Treatment No   Past Chemotherapy Treatment No   Active Radiation Treatment No   Past Radiation Treatment Yes   Date 01/19/10   Body Site right breast   Current Hormone Treatment No   Past Hormone Therapy No     What other symptoms do you have   Are you Having Heaviness or Tightness Yes   Are you having Pain Yes  has pain after self massage   Are you having pitting edema No   Is it Hard or Difficult  finding clothes that fit No   Do you have infections No   Is there Decreased scar  mobility No     Lymphedema Assessments   Lymphedema Assessments Upper extremities     Right Upper Extremity Lymphedema   15 cm Proximal to Olecranon Process 27.8 cm   Olecranon Process 21.5 cm   15 cm Proximal to Ulnar Styloid Process 20.6 cm   Just Proximal to Ulnar Styloid Process 13.8 cm   Across Hand at [employer] 17.5 cm   At [place] of 2nd Digit 5.7 cm     Left Upper Extremity Lymphedema   15 cm Proximal to Olecranon Process 27 cm   Olecranon Process 21 cm   15 cm Proximal to Ulnar Styloid Process 19.5 cm   Just Proximal to Ulnar Styloid Process 14 cm   Across Hand at [employer] 17.2 cm   At [place] of 2nd Digit 5.6 cm           Quick Dash - 10/16/17 0001    Open a tight or new jar No difficulty   Do heavy household chores (wash walls, wash floors) Moderate difficulty   Carry a shopping bag or briefcase No difficulty   Wash your back No difficulty   Use a knife to cut food No difficulty   Recreational activities in which you take some force or impact through your arm, shoulder, or hand (golf, hammering, tennis) Mild difficulty   During the past week, to what extent has your arm, shoulder or hand problem interfered with your normal social activities with family, friends, neighbors, or groups? Not at all   During the past week, to what extent has your arm, shoulder or hand problem limited your work or other regular daily activities Not at all   Arm, shoulder, or hand pain. Mild   Tingling (pins and needles) in your arm, shoulder, or hand None   Difficulty Sleeping No difficulty   DASH Score 9.09 %      Objective measurements completed on examination: See above findings.                          Long Term Clinic Goals - 10/16/17 1227      CC Long Term Goal  #1   Title Pt will receive a trial of [employer] compression pump for long term management of edema    Time 4   Period Weeks   Status New   Target Date 11/13/17     CC Long Term Goal  #2   Title Pt will be independent in self manual lymphatic drainage technique.   Time 4   Period Weeks   Status New   Target Date 11/13/17     CC Long Term Goal  #3   Title Pt will report a 50% improvement in right breast lymphedema to decrease risk of infection.   Time 4   Period Weeks   Status New   Target Date 11/13/17     CC Long Term Goal  #4   Title Pt to be indepent in a home exercise program for continued strengthening and stretching   Time 4   Period Weeks   Status New   Target Date 11/13/17     CC Long Term Goal  #5   Title Pt will demonstrate 160 degrees of right shoulder abduction to allow pt to reach out to side   Baseline 130   Time 4   Period Weeks   Status New  Plan - 10/16/17 1217    Clinical Impression Statement Pt arrives to clinic for right breast lymphedema. She was hear at this clinic for one visit last year. She had a lumpectomy in 2010 and underwent radiation. She began having lymphedema in 2012. She is unable to tolerate any type of compression due to pain and has a history of fibromyalgia. She has been attempting self drainage at home at least 6 days a week but states it is not as effective as what the therapist does. She would possibly benefit from a compression pump at this time since she is unable to tolerate compression bras. She would benefit from skilled PT services to decrease right breast lymphedema, decrease pain and recieve trial of a compression pump. She would also benefit from therapy to address her right shoulder tightness that bothers her when she does the self drainage technique.   History and Personal Factors relevant to plan of care: fibromyalgia   Clinical Presentation Stable   Clinical Presentation due to: pt has completed all treatments   Rehab Potential Good   Clinical Impairments Affecting Rehab Potential lumpectomy was 8 years ago    PT Frequency 2x / week   PT Duration 4 weeks   PT Treatment/Interventions ADLs/Self Care Home Management;Iontophoresis 4mg /ml Dexamethasone;Therapeutic exercise;Therapeutic activities;Patient/family education;Manual lymph drainage;Manual techniques;Passive range of motion;Vasopneumatic Device   PT Next Visit Plan begin MLD to right breast, may decrease to 1x/wk if pt has insurance issues, check on [facility] referral   Consulted and Agree with Plan of Care Patient      Patient will benefit from skilled therapeutic intervention in order to improve the following deficits and impairments:  Increased fascial restricitons, Pain, Postural dysfunction, Impaired UE functional use, Decreased strength, Decreased range of motion, Increased edema  Visit Diagnosis: Lymphedema, not elsewhere classified - Plan: PT plan of care cert/re-cert  Stiffness of right shoulder, not elsewhere classified - Plan: PT plan of care cert/re-cert  Acute pain of right shoulder - Plan: PT plan of care cert/re-cert  Muscle weakness (generalized) - Plan: PT plan of care cert/re-cert  Abnormal posture - Plan: PT plan of care cert/re-cert     Problem List Patient Active Problem List   Diagnosis Date Noted  . 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  . Intermittent palpitations 10/11/2017  . Intermittent chest pain 10/11/2017  . Migraine 12/30/2015  . Upper airway cough syndrome 10/29/2015  . Osteoporosis 06/06/2011  . Vitamin D deficiency 03/22/2010  . BREAST CANCER, HX OF 03/22/2010  . SPINAL STENOSIS, LUMBAR 03/25/2008  . HYPERLIPIDEMIA 09/28/2007  . Fibromyalgia 05/24/2007    Allyson Sabal [facility] 10/16/2017, 12:36 PM  [place], [place], [address] Phone: [phone]   Fax:  [phone]  Name: [name] MRN: [id number] Date of Birth: 1946/04/29  Manus Gunning, PT 10/16/17 12:37 PM

## 2017-10-19 ENCOUNTER — Ambulatory Visit: Payer: BLUE CROSS/BLUE SHIELD | Attending: General Surgery | Admitting: Physical Therapy

## 2017-10-19 DIAGNOSIS — M25611 Stiffness of right shoulder, not elsewhere classified: Secondary | ICD-10-CM | POA: Diagnosis not present

## 2017-10-19 DIAGNOSIS — R293 Abnormal posture: Secondary | ICD-10-CM | POA: Diagnosis not present

## 2017-10-19 DIAGNOSIS — M25511 Pain in right shoulder: Secondary | ICD-10-CM | POA: Diagnosis not present

## 2017-10-19 DIAGNOSIS — I890 Lymphedema, not elsewhere classified: Secondary | ICD-10-CM

## 2017-10-19 DIAGNOSIS — M6281 Muscle weakness (generalized): Secondary | ICD-10-CM

## 2017-10-19 NOTE — Therapy (Signed)
[place], [place], [address] Phone: [phone]   Fax:  [phone]  Physical Therapy Treatment  Patient Details  Name: [name] MRN: [id number] Date of Birth: Jul 16, 1946 Referring Provider: Barry Dienes  Encounter Date: 10/19/2017      PT End of Session - 10/19/17 1713    Visit Number 2   Number of Visits 9   PT Start Time 0258   PT Stop Time 1600   PT Time Calculation (min) 45 min   Activity Tolerance Patient tolerated treatment well   Behavior During Therapy [facility] for tasks assessed/performed      Past Medical History:  Diagnosis Date  . Arthritis   . Breast cancer ([place])    R lumpectomy ; radiation; no chemotherapy  . Bronchitis    recurrent  . Chronic low back pain    bulging disc & spinal stenosis  . Fibromyalgia   . Hyperlipemia   . Osteoporosis   . Pneumonia    as OP X 2; no Pneumovax  . Vitamin D deficiency    19 in  01/2009    Past Surgical History:  Procedure Laterality Date  . ABDOMINAL HYSTERECTOMY  1990   USO for fibroid , Dr Maryruth Eve  . BREAST SURGERY  2010   R side lumpectomy; Dr Margot Chimes  . CATARACT EXTRACTION, BILATERAL  2017  . CESAREAN SECTION      G 2 P 2  . CYSTECTOMY  2010  . EYE SURGERY     eye lid surgery  . no colonoscopy     "I just don't want to do it". SOC reviewed    There were no vitals filed for this visit.      Subjective Assessment - 10/19/17 1522    Subjective Pt states she has heard from [facility] but has not been able to schedule appt with them yet due to family issues    Pertinent History Pt with history of right breast cancer s/p lumpectomy in 2010. Pt completed radiation therapy in 2011 and has been having edema in right breast since 2012   Currently in Pain? No/denies                         [facility] Adult PT Treatment/Exercise - 10/19/17 0001      Manual Therapy   Manual Therapy Manual Lymphatic Drainage  (MLD);Passive ROM   Manual Lymphatic Drainage (MLD) In supine, short neck, superficial and deep abdominals, left  axillary nodes, anterior interaxillary anastamosis, right inguinal nodes right axillo-inguinal anastamosis,  right breast , extra time spend on axilla, then to sidelyiing for more work on lateral chest and posterior interaxillary anastamosis    Passive ROM to right shoulder during manual lymph drainage                         Long Term Clinic Goals - 10/16/17 1227      CC Long Term Goal  #1   Title Pt will receive a trial of FlexiTouch compression pump for long term management of edema   Time 4   Period Weeks   Status New   Target Date 11/13/17     CC Long Term Goal  #2   Title Pt will be independent in self manual lymphatic drainage technique.   Time 4   Period Weeks   Status New   Target Date 11/13/17     CC Long Term  Goal  #3   Title Pt will report a 50% improvement in right breast lymphedema to decrease risk of infection.   Time 4   Period Weeks   Status New   Target Date 11/13/17     CC Long Term Goal  #4   Title Pt to be indepent in a home exercise program for continued strengthening and stretching   Time 4   Period Weeks   Status New   Target Date 11/13/17     CC Long Term Goal  #5   Title Pt will demonstrate 160 degrees of right shoulder abduction to allow pt to reach out to side   Baseline 130   Time 4   Period Weeks   Status New            Plan - 10/19/17 1714    Clinical Impression Statement Pt has heard from [place] but has not yet set up first appointment. She tolerated manual lymph drainage well today with extra time to congestion in right axilla    Rehab Potential Good   Clinical Impairments Affecting Rehab Potential lumpectomy was 8 years ago   PT Frequency 2x / week   PT Duration 4 weeks   PT Treatment/Interventions ADLs/Self Care Home Management;Iontophoresis 4mg /ml Dexamethasone;Therapeutic exercise;Therapeutic  activities;Patient/family education;Manual lymph drainage;Manual techniques;Passive range of motion;Vasopneumatic Device   PT Next Visit Plan begin MLD to right breast, may decrease to 1x/wk if pt has insurance issues, check on [employer] results    Consulted and Agree with Plan of Care Patient      Patient will benefit from skilled therapeutic intervention in order to improve the following deficits and impairments:  Increased fascial restricitons, Pain, Postural dysfunction, Impaired UE functional use, Decreased strength, Decreased range of motion, Increased edema  Visit Diagnosis: Lymphedema, not elsewhere classified  Stiffness of right shoulder, not elsewhere classified  Muscle weakness (generalized)  Acute pain of right shoulder  Abnormal posture     Problem List Patient Active Problem List   Diagnosis Date Noted  . 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  . Intermittent palpitations 10/11/2017  . Intermittent chest pain 10/11/2017  . Migraine 12/30/2015  . Upper airway cough syndrome 10/29/2015  . Osteoporosis 06/06/2011  . Vitamin D deficiency 03/22/2010  . BREAST CANCER, HX OF 03/22/2010  . SPINAL STENOSIS, LUMBAR 03/25/2008  . HYPERLIPIDEMIA 09/28/2007  . Fibromyalgia 05/24/2007   Donato Heinz. Owens Shark PT  Norwood Levo 10/19/2017, 5:18 PM  [place], [place], [address] Phone: [phone]   Fax:  [phone]  Name: [name] MRN: [id number] Date of Birth: 1946-06-14

## 2017-10-21 ENCOUNTER — Other Ambulatory Visit: Payer: Self-pay | Admitting: Internal Medicine

## 2017-10-21 ENCOUNTER — Encounter: Payer: Self-pay | Admitting: Family Medicine

## 2017-10-21 ENCOUNTER — Ambulatory Visit (INDEPENDENT_AMBULATORY_CARE_PROVIDER_SITE_OTHER): Payer: BLUE CROSS/BLUE SHIELD | Admitting: Family Medicine

## 2017-10-21 VITALS — BP 124/76 | HR 68 | Temp 98.4°F | Ht 65.0 in | Wt 126.1 lb

## 2017-10-21 DIAGNOSIS — R8290 Unspecified abnormal findings in urine: Secondary | ICD-10-CM

## 2017-10-21 DIAGNOSIS — N3000 Acute cystitis without hematuria: Secondary | ICD-10-CM | POA: Diagnosis not present

## 2017-10-21 LAB — POCT URINALYSIS DIPSTICK
Bilirubin, UA: NEGATIVE
Glucose, UA: NEGATIVE
Ketones, UA: NEGATIVE
Nitrite, UA: NEGATIVE
PROTEIN UA: 15
RBC UA: NEGATIVE
SPEC GRAV UA: 1.025 (ref 1.010–1.025)
UROBILINOGEN UA: 0.2 U/dL
pH, UA: 5.5 (ref 5.0–8.0)

## 2017-10-21 MED ORDER — CEPHALEXIN 500 MG PO CAPS: 500.0000 mg | ORAL_CAPSULE | Freq: Two times a day (BID) | ORAL | 0 refills | Status: DC

## 2017-10-21 NOTE — Progress Notes (Signed)
[place] at [facility] [address], [place], [place] [address] [phone]  Date:  10/21/2017   Name:  TAVON CORRIHER   DOB:  02-Dec-1946   MRN:  [id number]  PCP:  Binnie Rail, MD    Chief Complaint: No chief complaint on file.   History of Present Illness:  PANAGIOTA PERFETTI is a 71 y.o. very pleasant female patient who presents with the following:  History of breast cancer Here today with concern of possible UTI-she has noted that her urine is cloudy in appearance- more so in the morning. She has noted this for about 4 days now No real dyrusia- maybe a bit uncomfortable No hematuria She has noted some urgency but no frequency No fever, chils or belly pain.  She always has back pain due to her spinal stenosis She is feeling a bit lousy overall    Patient Active Problem List   Diagnosis Date Noted  . 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  . Intermittent palpitations 10/11/2017  . Intermittent chest pain 10/11/2017  . Migraine 12/30/2015  . Upper airway cough syndrome 10/29/2015  . Osteoporosis 06/06/2011  . Vitamin D deficiency 03/22/2010  . BREAST CANCER, HX OF 03/22/2010  . SPINAL STENOSIS, LUMBAR 03/25/2008  . HYPERLIPIDEMIA 09/28/2007  . Fibromyalgia 05/24/2007    Past Medical History:  Diagnosis Date  . Arthritis   . Breast cancer ([place])    R lumpectomy ; radiation; no chemotherapy  . Bronchitis    recurrent  . Chronic low back pain    bulging disc & spinal stenosis  . Fibromyalgia   . Hyperlipemia   . Osteoporosis   . Pneumonia    as OP X 2; no Pneumovax  . Vitamin D deficiency    19 in  01/2009    Past Surgical History:  Procedure Laterality Date  . ABDOMINAL HYSTERECTOMY  1990   USO for fibroid , Dr Maryruth Eve  . BREAST SURGERY  2010   R side lumpectomy; Dr Margot Chimes  . CATARACT EXTRACTION, BILATERAL  2017  . CESAREAN SECTION      G 2 P 2  . CYSTECTOMY  2010  . EYE SURGERY     eye lid surgery   . no colonoscopy     "I just don't want to do it". SOC reviewed    Social History  Substance Use Topics  . Smoking status: Former Smoker    Quit date: 12/19/1970  . Smokeless tobacco: Never Used     Comment: [phone], up to 3 cigarettes / day  . Alcohol use No    Family History  Problem Relation Age of Onset  . Atrial fibrillation Mother        S/P cardioversion  . Ulcers Mother   . Hypertension Mother   . Dementia Mother   . Heart attack Father 66       died @ 93  . Heart attack Paternal Aunt        < 48  . Heart attack Paternal Grandfather        > 55  . Heart attack Paternal Uncle        > 55  . Heart attack Paternal Grandmother        < 34  . Hypertension Brother   . Thyroid disease Daughter        on Synthroid since high school  . Diabetes Brother   . Stroke Neg Hx  Allergies  Allergen Reactions  . Morphine Hives and Palpitations     Because of a history of documented adverse serious drug reaction;Medi Alert bracelet  is recommended  . Albuterol Palpitations    REACTION: heart racing  . Augmentin [Amoxicillin-Pot Clavulanate]     Abdominal cramping  . Ciprofloxacin     Abdominal cramping  . Fish Allergy Other (See Comments)    Severe headache  . Risedronate Sodium     Gi symptoms  . Ibandronate Sodium     Stomach cramps    Medication list has been reviewed and updated.  Current Outpatient Prescriptions on File Prior to Visit  Medication Sig Dispense Refill  . fluticasone furoate-vilanterol (BREO ELLIPTA) 100-25 MCG/INH AEPB Inhale 1 puff into the lungs daily. 1 each 1  . gabapentin (NEURONTIN) 100 MG capsule Take 1 capsule (100 mg total) by mouth 3 (three) times daily as needed. 30 capsule 0  . RELPAX 20 MG tablet TAKE 1 TABLET BY MOUTH AT ONSET OF HEADACHE. MAY REPEAT 1 TABLET IN 2 HOURS IF HEADACHE PERSISTS OR RECURS 9 tablet 0  . traZODone (DESYREL) 50 MG tablet TAKE 3 TABLETS(150 MG) BY MOUTH AT BEDTIME 270 tablet 3  . benzonatate  (TESSALON) 200 MG capsule One four times daily as needed for cough (Patient not taking: Reported on 10/21/2017) 40 capsule 2   No current facility-administered medications on file prior to visit.     Review of Systems:  As per HPI- otherwise negative.   Physical Examination: Vitals:   10/21/17 1037  BP: 124/76  Pulse: 68  Temp: 98.4 F (36.9 C)  SpO2: 97%   Vitals:   10/21/17 1037  Weight: 126 lb 1.3 oz (57.2 kg)  Height: 5\' 5"  (1.651 m)   Body mass index is 20.98 kg/m. Ideal Body Weight: Weight in (lb) to have BMI = 25: 149.9  GEN: WDWN, NAD, Non-toxic, A & O x 3, slim build, looks well  HEENT: Atraumatic, Normocephalic. Neck supple. No masses, No LAD.  Bilateral TM wnl, oropharynx normal.  PEERL,EOMI.   Ears and Nose: No external deformity. CV: RRR, No M/G/R. No JVD. No thrill. No extra heart sounds. PULM: CTA B, no wheezes, crackles, rhonchi. No retractions. No resp. distress. No accessory muscle use. ABD: S, NT, ND, +BS. No rebound. No HSM. EXTR: No c/c/e NEURO Normal gait.  PSYCH: Normally interactive. Conversant. Not depressed or anxious appearing.  Calm demeanor. Benign belly, no CVA tenderness    Results for orders placed or performed in visit on 10/21/17  POCT urinalysis dipstick  Result Value Ref Range   Color, UA yellow    Clarity, UA cloudy    Glucose, UA neg    Bilirubin, UA neg    Ketones, UA neg    Spec Grav, UA 1.025 1.010 - 1.025   Blood, UA neg    pH, UA 5.5 5.0 - 8.0   Protein, UA 15    Urobilinogen, UA 0.2 0.2 or 1.0 E.U./dL   Nitrite, UA neg    Leukocytes, UA Large (3+) (A) Negative    Assessment and Plan: Acute cystitis without hematuria - Plan: cephALEXin (KEFLEX) 500 MG capsule  Cloudy urine - Plan: POCT urinalysis dipstick, Urine Culture  Here with likely UTI Start treatment with keflex She will let us know if not feeling better soon- Sooner if worse.    Signed Lamar Blinks, MD

## 2017-10-21 NOTE — Patient Instructions (Signed)
We are going to treat you for a presumed UTI with keflex for one week- twice a day I will check your urine culture and get back to you Rest, plenty of fluids If you are not feeling better in the next 2 days please let me know- sooner if you are worse!

## 2017-10-24 DIAGNOSIS — R8290 Unspecified abnormal findings in urine: Secondary | ICD-10-CM | POA: Diagnosis not present

## 2017-10-25 ENCOUNTER — Ambulatory Visit: Payer: BLUE CROSS/BLUE SHIELD

## 2017-10-25 DIAGNOSIS — I890 Lymphedema, not elsewhere classified: Secondary | ICD-10-CM | POA: Diagnosis not present

## 2017-10-25 DIAGNOSIS — R293 Abnormal posture: Secondary | ICD-10-CM | POA: Diagnosis not present

## 2017-10-25 DIAGNOSIS — M25611 Stiffness of right shoulder, not elsewhere classified: Secondary | ICD-10-CM | POA: Diagnosis not present

## 2017-10-25 DIAGNOSIS — M6281 Muscle weakness (generalized): Secondary | ICD-10-CM | POA: Diagnosis not present

## 2017-10-25 DIAGNOSIS — M25511 Pain in right shoulder: Secondary | ICD-10-CM | POA: Diagnosis not present

## 2017-10-25 NOTE — Therapy (Signed)
[place], [place], [address] Phone: [phone]   Fax:  [phone]  Physical Therapy Treatment  Patient Details  Name: [name] MRN: [id number] Date of Birth: [birth year] Referring Provider: Barry Dienes   Encounter Date: 10/25/2017  PT End of Session - 10/25/17 1435    Visit Number  3    Number of Visits  9    Date for PT Re-Evaluation  11/13/17    PT Start Time  7371    PT Stop Time  1434    PT Time Calculation (min)  46 min    Activity Tolerance  Patient tolerated treatment well    Behavior During Therapy  [facility] for tasks assessed/performed       Past Medical History:  Diagnosis Date  . Arthritis   . Breast cancer ([place])    R lumpectomy ; radiation; no chemotherapy  . Bronchitis    recurrent  . Chronic low back pain    bulging disc & spinal stenosis  . Fibromyalgia   . Hyperlipemia   . Osteoporosis   . Pneumonia    as OP X 2; no Pneumovax  . Vitamin D deficiency    19 in  01/2009    Past Surgical History:  Procedure Laterality Date  . ABDOMINAL HYSTERECTOMY  1990   USO for fibroid , Dr Maryruth Eve  . BREAST SURGERY  2010   R side lumpectomy; Dr Margot Chimes  . CATARACT EXTRACTION, BILATERAL  2017  . CESAREAN SECTION      G 2 P 2  . CYSTECTOMY  2010  . EYE SURGERY     eye lid surgery  . no colonoscopy     "I just don't want to do it". SOC reviewed    There were no vitals filed for this visit.  Subjective Assessment - 10/25/17 1354    Subjective  I have spoken with [employer], they are trying to work things out with my insurance so hopefully I can do the demonstration next week. Didn't notice any change after last visit.     Pertinent History  Pt with history of right breast cancer s/p lumpectomy in 2010. Pt completed radiation therapy in 2011 and has been having edema in right breast since 2012    Patient Stated Goals  for the swelling to go down    Currently in Pain?  No/denies                       [facility] Adult PT Treatment/Exercise - 10/25/17 0001      Manual Therapy   Manual Therapy  Manual Lymphatic Drainage (MLD);Passive ROM    Manual Lymphatic Drainage (MLD)  In supine, short neck, superficial and deep abdominals, left  axillary nodes, anterior interaxillary anastamosis, right inguinal nodes right axillo-inguinal anastamosis,  right breast , extra time spend on axilla, then to sidelyiing for more work on lateral chest and posterior interaxillary anastamosis , then finished in supine ending with anastomosis    Passive ROM  --                     [facility] Term Clinic Goals - 10/16/17 1227      CC Long Term Goal  #1   Title  Pt will receive a trial of FlexiTouch compression pump for long term management of edema    Time  4    Period  Weeks    Status  New  Target Date  11/13/17      CC Long Term Goal  #2   Title  Pt will be independent in self manual lymphatic drainage technique.    Time  4    Period  Weeks    Status  New    Target Date  11/13/17      CC Long Term Goal  #3   Title  Pt will report a 50% improvement in right breast lymphedema to decrease risk of infection.    Time  4    Period  Weeks    Status  New    Target Date  11/13/17      CC Long Term Goal  #4   Title  Pt to be indepent in a home exercise program for continued strengthening and stretching    Time  4    Period  Weeks    Status  New    Target Date  11/13/17      CC Long Term Goal  #5   Title  Pt will demonstrate 160 degrees of right shoulder abduction to allow pt to reach out to side    Baseline  130    Time  4    Period  Weeks    Status  New         Plan - 10/25/17 1436    Clinical Impression Statement  Continued manual lymph drainage with pt today reviewing throughout. Pt reported feeling some confusion per the handout she was issued last time she had therapy (she isn't sure how long it's been since she was issued this), so she plans to  nring handout so we can go through this with her. Spoke with pt further about compression bras and she reports just not beog able to tolerate the compression as it feels very uncomfortable to her. She has fibromyalgia and feels like this has something to do with her decreased tolerance for compression. She hopes to have the [employer] demonstration next week.     Rehab Potential  Good    Clinical Impairments Affecting Rehab Potential  lumpectomy was 8 years ago    PT Frequency  2x / week    PT Duration  4 weeks    PT Treatment/Interventions  ADLs/Self Care Home Management;Iontophoresis 4mg /ml Dexamethasone;Therapeutic exercise;Therapeutic activities;Patient/family education;Manual lymph drainage;Manual techniques;Passive range of motion;Vasopneumatic Device    PT Next Visit Plan  Cont MLD to right breast, may decrease to 1x/wk if pt has insurance issues, check on [employer] results . Review pts old self MLD handout if she brings this.     Consulted and Agree with Plan of Care  Patient       Patient will benefit from skilled therapeutic intervention in order to improve the following deficits and impairments:  Increased fascial restricitons, Pain, Postural dysfunction, Impaired UE functional use, Decreased strength, Decreased range of motion, Increased edema  Visit Diagnosis: Lymphedema, not elsewhere classified     Problem List Patient Active Problem List   Diagnosis Date Noted  . 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  . Intermittent palpitations 10/11/2017  . Intermittent chest pain 10/11/2017  . Migraine 12/30/2015  . Upper airway cough syndrome 10/29/2015  . Osteoporosis 06/06/2011  . Vitamin D deficiency 03/22/2010  . BREAST CANCER, HX OF 03/22/2010  . SPINAL STENOSIS, LUMBAR 03/25/2008  . HYPERLIPIDEMIA 09/28/2007  . Fibromyalgia 05/24/2007    Otelia Limes, PTA 10/25/2017, 2:44 PM  [place], [place], [address] Phone:  [phone]   Fax:  [phone]  Name: [name] MRN: [id number] Date of Birth: 17-Mar-1946

## 2017-10-30 ENCOUNTER — Ambulatory Visit: Payer: BLUE CROSS/BLUE SHIELD | Admitting: Physical Therapy

## 2017-11-01 ENCOUNTER — Ambulatory Visit: Payer: BLUE CROSS/BLUE SHIELD | Admitting: Physical Therapy

## 2017-11-01 DIAGNOSIS — M6281 Muscle weakness (generalized): Secondary | ICD-10-CM | POA: Diagnosis not present

## 2017-11-01 DIAGNOSIS — M25611 Stiffness of right shoulder, not elsewhere classified: Secondary | ICD-10-CM | POA: Diagnosis not present

## 2017-11-01 DIAGNOSIS — I890 Lymphedema, not elsewhere classified: Secondary | ICD-10-CM

## 2017-11-01 DIAGNOSIS — M25511 Pain in right shoulder: Secondary | ICD-10-CM | POA: Diagnosis not present

## 2017-11-01 DIAGNOSIS — R293 Abnormal posture: Secondary | ICD-10-CM | POA: Diagnosis not present

## 2017-11-01 NOTE — Therapy (Signed)
[place], [place], [address] Phone: [phone]   Fax:  [phone]  Physical Therapy Treatment  Patient Details  Name: [name] MRN: [id number] Date of Birth: 02/14/1946 Referring Provider: Barry Dienes   Encounter Date: 11/01/2017  PT End of Session - 11/01/17 1725    Visit Number  4    Number of Visits  9    Date for PT Re-Evaluation  11/13/17    PT Start Time  3646    PT Stop Time  1348    PT Time Calculation (min)  46 min    Activity Tolerance  Patient tolerated treatment well    Behavior During Therapy  [facility] for tasks assessed/performed       Past Medical History:  Diagnosis Date  . Arthritis   . Breast cancer ([place])    R lumpectomy ; radiation; no chemotherapy  . Bronchitis    recurrent  . Chronic low back pain    bulging disc & spinal stenosis  . Fibromyalgia   . Hyperlipemia   . Osteoporosis   . Pneumonia    as OP X 2; no Pneumovax  . Vitamin D deficiency    19 in  01/2009    Past Surgical History:  Procedure Laterality Date  . ABDOMINAL HYSTERECTOMY  1990   USO for fibroid , Dr Maryruth Eve  . BREAST SURGERY  2010   R side lumpectomy; Dr Margot Chimes  . CATARACT EXTRACTION, BILATERAL  2017  . CESAREAN SECTION      G 2 P 2  . CYSTECTOMY  2010  . EYE SURGERY     eye lid surgery  . no colonoscopy     "I just don't want to do it". SOC reviewed    There were no vitals filed for this visit.  Subjective Assessment - 11/01/17 1305    Subjective  Nothing new. [employer] came out Friday and she did the demo; I was exhausted after that. We know insurance won't pay for it, so I have to decide what to do.     Currently in Pain?  No/denies         [facility] PT Assessment - 11/01/17 0001      AROM   Right Shoulder ABduction  150 Degrees "I had exercise yesterday."                  [facility] Adult PT Treatment/Exercise - 11/01/17 0001      Self-Care   Self-Care  Other  Self-Care Comments    Other Self-Care Comments   Discussed self-manual lymph drainage appropriate steps. She brought in her old instruction sheet today and it remains appropriate for her.  She does feel she knows what to do.      Manual Therapy   Manual Lymphatic Drainage (MLD)  In supine, short neck, superficial and deep abdominals, left  axillary nodes, anterior interaxillary anastamosis, right inguinal nodes right axillo-inguinal anastamosis,  right breast, directing toward pathways; then to sidelying for more work on lateral breast and posterior interaxillary anastamosis as well as right axillo-inguinal anastomosis, then finished in supine ending with anastomoses, left axilla, and right groin             PT Education - 11/01/17 1724    Education provided  Yes    Education Details  affirming that what she has been doing for breast manual lymph drainage is current and correct    Person(s) Educated  Patient    Methods  Explanation she brought in her old handout, which we looked at together    Comprehension  Verbalized understanding             [place] - 11/01/17 1315      CC Long Term Goal  #1   Title  Pt will receive a trial of FlexiTouch compression pump for long term management of edema    Status  Achieved      CC Long Term Goal  #2   Title  Pt will be independent in self manual lymphatic drainage technique.    Status  Achieved      CC Long Term Goal  #3   Title  Pt will report a 50% improvement in right breast lymphedema to decrease risk of infection.    Baseline  no change as of 11/01/17    Status  On-going      CC Long Term Goal  #4   Title  Pt to be indepent in a home exercise program for continued strengthening and stretching    Baseline  Does a gentle fitness class twice a week and feels she gets what she needs there.    Status  Partially Met      CC Long Term Goal  #5   Title  Pt will demonstrate 160 degrees of right shoulder abduction to  allow pt to reach out to side    Baseline  130 at eval; 150 on 11/01/17    Status  Partially Met         Plan - 11/01/17 1725    Clinical Impression Statement  Pt. brought in her old lymphedema handout from 2015; this does show appropriate technique for her to use, so she was encouraged to continue. She had the [employer] and is now deciding whether to pursue that.  She reported feeling exhausted after the demo was completed.    Rehab Potential  Good    Clinical Impairments Affecting Rehab Potential  lumpectomy was 8 years ago    PT Frequency  2x / week    PT Duration  4 weeks    PT Treatment/Interventions  ADLs/Self Care Home Management;Iontophoresis '4mg'$ /ml Dexamethasone;Therapeutic exercise;Therapeutic activities;Patient/family education;Manual lymph drainage;Manual techniques;Passive range of motion;Vasopneumatic Device    PT Next Visit Plan  Cont MLD to right breast.    Consulted and Agree with Plan of Care  Patient       Patient will benefit from skilled therapeutic intervention in order to improve the following deficits and impairments:  Increased fascial restricitons, Pain, Postural dysfunction, Impaired UE functional use, Decreased strength, Decreased range of motion, Increased edema  Visit Diagnosis: Lymphedema, not elsewhere classified     Problem List Patient Active Problem List   Diagnosis Date Noted  . 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  . Intermittent palpitations 10/11/2017  . Intermittent chest pain 10/11/2017  . Migraine 12/30/2015  . Upper airway cough syndrome 10/29/2015  . Osteoporosis 06/06/2011  . Vitamin D deficiency 03/22/2010  . BREAST CANCER, HX OF 03/22/2010  . SPINAL STENOSIS, LUMBAR 03/25/2008  . HYPERLIPIDEMIA 09/28/2007  . Fibromyalgia 05/24/2007    Jabree Pernice 11/01/2017, 5:28 PM  [place], [place], [address] Phone: [phone]   Fax:   [phone]  Name: NAVEEN LORUSSO MRN: [id number] Date of Birth: May 04, 1946  Serafina Royals, PT 11/01/17 5:28 PM

## 2017-11-03 ENCOUNTER — Encounter: Payer: Self-pay | Admitting: Physical Therapy

## 2017-11-03 ENCOUNTER — Ambulatory Visit: Payer: BLUE CROSS/BLUE SHIELD | Admitting: Physical Therapy

## 2017-11-03 DIAGNOSIS — M25511 Pain in right shoulder: Secondary | ICD-10-CM | POA: Diagnosis not present

## 2017-11-03 DIAGNOSIS — R293 Abnormal posture: Secondary | ICD-10-CM | POA: Diagnosis not present

## 2017-11-03 DIAGNOSIS — I890 Lymphedema, not elsewhere classified: Secondary | ICD-10-CM

## 2017-11-03 DIAGNOSIS — M6281 Muscle weakness (generalized): Secondary | ICD-10-CM | POA: Diagnosis not present

## 2017-11-03 DIAGNOSIS — M25611 Stiffness of right shoulder, not elsewhere classified: Secondary | ICD-10-CM | POA: Diagnosis not present

## 2017-11-03 NOTE — Therapy (Signed)
[place], [place], [address] Phone: [phone]   Fax:  [phone]  Physical Therapy Treatment  Patient Details  Name: [name] MRN: [id number] Date of Birth: Mar 21, 1946 Referring Provider: Barry Dienes   Encounter Date: 11/03/2017  PT End of Session - 11/03/17 1200    Visit Number  5    Number of Visits  9    Date for PT Re-Evaluation  11/13/17    PT Start Time  1106    PT Stop Time  1150    PT Time Calculation (min)  44 min    Activity Tolerance  Patient tolerated treatment well    Behavior During Therapy  [facility] for tasks assessed/performed       Past Medical History:  Diagnosis Date  . Arthritis   . Breast cancer ([place])    R lumpectomy ; radiation; no chemotherapy  . Bronchitis    recurrent  . Chronic low back pain    bulging disc & spinal stenosis  . Fibromyalgia   . Hyperlipemia   . Osteoporosis   . Pneumonia    as OP X 2; no Pneumovax  . Vitamin D deficiency    19 in  01/2009    Past Surgical History:  Procedure Laterality Date  . ABDOMINAL HYSTERECTOMY  1990   USO for fibroid , Dr Maryruth Eve  . BREAST SURGERY  2010   R side lumpectomy; Dr Margot Chimes  . CATARACT EXTRACTION, BILATERAL  2017  . CESAREAN SECTION      G 2 P 2  . CYSTECTOMY  2010  . EYE SURGERY     eye lid surgery  . no colonoscopy     "I just don't want to do it". SOC reviewed    There were no vitals filed for this visit.  Subjective Assessment - 11/03/17 1106    Subjective  My swelling is there.   (Pended)     Pertinent History  Pt with history of right breast cancer s/p lumpectomy in 2010. Pt completed radiation therapy in 2011 and has been having edema in right breast since 2012  (Pended)     Patient Stated Goals  for the swelling to go down  (Pended)     Currently in Pain?  No/denies  (Pended)     Pain Score  0-No pain  (Pended)                       [facility] Adult PT Treatment/Exercise -  11/03/17 0001      Manual Therapy   Manual Lymphatic Drainage (MLD)  In supine, short neck, superficial and deep abdominals, left  axillary nodes, anterior interaxillary anastamosis, right inguinal nodes right axillo-inguinal anastamosis,  right breast, directing toward pathways; then retracing all steps. Had pt demonstrate correct technique and pt had been gliding her hand over her skin instead of stretching. Educated pt to keep whole hand flat and stretch skin without gliding, pt was able to demonstrate correct technique with verbal cues                     [facility] Goals - 11/01/17 1315      CC Long Term Goal  #1   Title  Pt will receive a trial of [employer]Touch compression pump for long term management of edema    Status  Achieved      CC Long Term Goal  #2   Title  Pt will  be independent in self manual lymphatic drainage technique.    Status  Achieved      CC Long Term Goal  #3   Title  Pt will report a 50% improvement in right breast lymphedema to decrease risk of infection.    Baseline  no change as of 11/01/17    Status  On-going      CC Long Term Goal  #4   Title  Pt to be indepent in a home exercise program for continued strengthening and stretching    Baseline  Does a gentle fitness class twice a week and feels she gets what she needs there.    Status  Partially Met      CC Long Term Goal  #5   Title  Pt will demonstrate 160 degrees of right shoulder abduction to allow pt to reach out to side    Baseline  130 at eval; 150 on 11/01/17    Status  Partially Met         Plan - 11/03/17 1202    Clinical Impression Statement  Patient still has not noticed any improvement in her swelling. She is thinking about getting the [employer]. Today pt demonstrated how she is doing the massage technique. She was gliding over the skin and using her finger tips. Therapist demonstrated correct technique of stretching skin and using whole hand flat against skin. Pt also  encouraged to try a camisole with a shelf bra for some added compressions since she cannot tolerate compression bras.     Rehab Potential  Good    Clinical Impairments Affecting Rehab Potential  lumpectomy was 8 years ago    PT Frequency  2x / week    PT Duration  4 weeks    PT Treatment/Interventions  ADLs/Self Care Home Management;Iontophoresis 44m/ml Dexamethasone;Therapeutic exercise;Therapeutic activities;Patient/family education;Manual lymph drainage;Manual techniques;Passive range of motion;Vasopneumatic Device    PT Next Visit Plan  Cont MLD to right breast reassess pt's self drainage technique, ask about cami with shelf bra    Consulted and Agree with Plan of Care  Patient       Patient will benefit from skilled therapeutic intervention in order to improve the following deficits and impairments:  Increased fascial restricitons, Pain, Postural dysfunction, Impaired UE functional use, Decreased strength, Decreased range of motion, Increased edema  Visit Diagnosis: Lymphedema, not elsewhere classified     Problem List Patient Active Problem List   Diagnosis Date Noted  . 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  . Intermittent palpitations 10/11/2017  . Intermittent chest pain 10/11/2017  . Migraine 12/30/2015  . Upper airway cough syndrome 10/29/2015  . Osteoporosis 06/06/2011  . Vitamin D deficiency 03/22/2010  . BREAST CANCER, HX OF 03/22/2010  . SPINAL STENOSIS, LUMBAR 03/25/2008  . HYPERLIPIDEMIA 09/28/2007  . Fibromyalgia 05/24/2007    BAllyson SabalBWarner Hospital And Health Services11/16/2018, 12:08 PM  [place] [address]Phone: [phone]  Fax:  [phone] Name: CREILLY MOLCHANMRN: [id number] of Birth: 307-Feb-1947 BManus Gunning PT 11/03/17 12:08 PM

## 2017-11-06 ENCOUNTER — Encounter: Payer: Self-pay | Admitting: Family Medicine

## 2017-11-06 ENCOUNTER — Other Ambulatory Visit: Payer: Self-pay | Admitting: General Surgery

## 2017-11-06 ENCOUNTER — Ambulatory Visit: Payer: BLUE CROSS/BLUE SHIELD | Admitting: Physical Therapy

## 2017-11-06 ENCOUNTER — Encounter: Payer: Self-pay | Admitting: Physical Therapy

## 2017-11-06 DIAGNOSIS — I890 Lymphedema, not elsewhere classified: Secondary | ICD-10-CM | POA: Diagnosis not present

## 2017-11-06 DIAGNOSIS — M25511 Pain in right shoulder: Secondary | ICD-10-CM | POA: Diagnosis not present

## 2017-11-06 DIAGNOSIS — M6281 Muscle weakness (generalized): Secondary | ICD-10-CM | POA: Diagnosis not present

## 2017-11-06 DIAGNOSIS — M25611 Stiffness of right shoulder, not elsewhere classified: Secondary | ICD-10-CM | POA: Diagnosis not present

## 2017-11-06 DIAGNOSIS — R293 Abnormal posture: Secondary | ICD-10-CM | POA: Diagnosis not present

## 2017-11-06 DIAGNOSIS — Z1231 Encounter for screening mammogram for malignant neoplasm of breast: Secondary | ICD-10-CM

## 2017-11-06 NOTE — Therapy (Signed)
[place], [place], [address] Phone: [phone]   Fax:  [phone]  Physical Therapy Treatment  Patient Details  Name: [name] MRN: [id number] Date of Birth: May 02, 1946 Referring Provider: Barry Dienes   Encounter Date: 11/06/2017  PT End of Session - 11/06/17 1347    Visit Number  6    Number of Visits  9    Date for PT Re-Evaluation  11/13/17    PT Start Time  1301    PT Stop Time  1346    PT Time Calculation (min)  45 min    Activity Tolerance  Patient tolerated treatment well    Behavior During Therapy  [facility] for tasks assessed/performed       Past Medical History:  Diagnosis Date  . Arthritis   . Breast cancer ([place])    R lumpectomy ; radiation; no chemotherapy  . Bronchitis    recurrent  . Chronic low back pain    bulging disc & spinal stenosis  . Fibromyalgia   . Hyperlipemia   . Osteoporosis   . Pneumonia    as OP X 2; no Pneumovax  . Vitamin D deficiency    19 in  01/2009    Past Surgical History:  Procedure Laterality Date  . ABDOMINAL HYSTERECTOMY  1990   USO for fibroid , Dr Maryruth Eve  . BREAST SURGERY  2010   R side lumpectomy; Dr Margot Chimes  . CATARACT EXTRACTION, BILATERAL  2017  . CESAREAN SECTION      G 2 P 2  . CYSTECTOMY  2010  . EYE SURGERY     eye lid surgery  . no colonoscopy     "I just don't want to do it". SOC reviewed    There were no vitals filed for this visit.  Subjective Assessment - 11/06/17 1304    Subjective  I can tell a difference now in my swelling by the way my bra fits.    Pertinent History  Pt with history of right breast cancer s/p lumpectomy in 2010. Pt completed radiation therapy in 2011 and has been having edema in right breast since 2012    Patient Stated Goals  for the swelling to go down    Currently in Pain?  Yes    Pain Score  7     Pain Location  Back    Pain Orientation  Lower    Pain Descriptors / Indicators  --  unrelenting    Pain Type  Chronic pain                      [facility] Adult PT Treatment/Exercise - 11/06/17 0001      Manual Therapy   Manual Lymphatic Drainage (MLD)  In supine, short neck, superficial and deep abdominals, left  axillary nodes, anterior interaxillary anastamosis, right inguinal nodes right axillo-inguinal anastamosis,  right breast, directing toward pathways; then retracing all steps. Had pt demonstrate correct technique and pt had been gliding her hand over her skin instead of stretching. Educated pt to keep whole hand flat and stretch skin without gliding, pt was able to demonstrate correct technique with verbal cues. Also issued handout to pt to follow at home.                     Long Term Clinic Goals - 11/01/17 1315      CC Long Term Goal  #1   Title  Pt will  receive a trial of FlexiTouch compression pump for long term management of edema    Status  Achieved      CC Long Term Goal  #2   Title  Pt will be independent in self manual lymphatic drainage technique.    Status  Achieved      CC Long Term Goal  #3   Title  Pt will report a 50% improvement in right breast lymphedema to decrease risk of infection.    Baseline  no change as of 11/01/17    Status  On-going      CC Long Term Goal  #4   Title  Pt to be indepent in a home exercise program for continued strengthening and stretching    Baseline  Does a gentle fitness class twice a week and feels she gets what she needs there.    Status  Partially Met      CC Long Term Goal  #5   Title  Pt will demonstrate 160 degrees of right shoulder abduction to allow pt to reach out to side    Baseline  130 at eval; 150 on 11/01/17    Status  Partially Met         Plan - 11/06/17 1347    Clinical Impression Statement  Pt could tell a difference in swelling after last appointment. She stated her bra was fitting better. She is still thinking of ordering the [employer]Touch. Had pt demonstrate  correct technique again and she was going too fast and gliding on skin still. Re educated pt in proper technique and issued handout.     Rehab Potential  Good    Clinical Impairments Affecting Rehab Potential  lumpectomy was 8 years ago    PT Frequency  2x / week    PT Duration  4 weeks    PT Treatment/Interventions  ADLs/Self Care Home Management;Iontophoresis 57m/ml Dexamethasone;Therapeutic exercise;Therapeutic activities;Patient/family education;Manual lymph drainage;Manual techniques;Passive range of motion;Vasopneumatic Device    PT Next Visit Plan  Cont MLD to right breast reassess pt's self drainage technique, ask about cami with shelf bra    Consulted and Agree with Plan of Care  Patient       Patient will benefit from skilled therapeutic intervention in order to improve the following deficits and impairments:  Increased fascial restricitons, Pain, Postural dysfunction, Impaired UE functional use, Decreased strength, Decreased range of motion, Increased edema  Visit Diagnosis: Lymphedema, not elsewhere classified     Problem List Patient Active Problem List   Diagnosis Date Noted  . 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  . Intermittent palpitations 10/11/2017  . Intermittent chest pain 10/11/2017  . Migraine 12/30/2015  . Upper airway cough syndrome 10/29/2015  . Osteoporosis 06/06/2011  . Vitamin D deficiency 03/22/2010  . BREAST CANCER, HX OF 03/22/2010  . SPINAL STENOSIS, LUMBAR 03/25/2008  . HYPERLIPIDEMIA 09/28/2007  . Fibromyalgia 05/24/2007    BAllyson SabalBSurprise Valley Community Hospital11/19/2018, 1:49 PM  [place] [address]Phone: [phone]  Fax:  [phone] Name: CERIC MORGANTIMRN: [id number] of Birth: [birth year] BManus Gunning PT 11/06/17 1:49 PM

## 2017-11-06 NOTE — Patient Instructions (Signed)
Self manual lymph drainage: Perform this sequence once a day.  Only give enough pressure no your skin to make the skin move.  Diaphragmatic - Supine   Inhale through nose making navel move out toward hands. Exhale through puckered lips, hands follow navel in. Repeat _5__ times. Rest _10__ seconds between repeats.   Copyright  VHI. All rights reserved.  Hug yourself.  Do circles at your neck just above your collarbones.  Repeat this 10 times.  Axilla - One at a Time   Using full weight of flat hand and fingers at center of uninvolved armpit, make _10__ in-place circles.   Copyright  VHI. All rights reserved.  LEG: Inguinal Nodes Stimulation   With small finger side of hand against hip crease on involved side, gently perform circles at the crease. Repeat __10_ times.   Copyright  VHI. All rights reserved.  1) Axilla to Inguinal Nodes - Sweep   On involved side, sweep _4__ times from armpit along side of trunk to hip crease.  Now gently stretch skin from the involved side to the uninvolved side across the chest at the shoulder line.  Repeat that 4 times.  Draw an imaginary diagonal line from upper outer breast through the nipple area toward lower inner breast.  Direct fluid upward and inward from this line toward the pathway across your upper chest .  Do this in three rows to treat all of the upper inner breast tissue, and do each row 3-4x.      Direct fluid to treat all of lower outer breast tissue downward and outward toward      pathway that is aimed at the left groin.  Finish by doing the pathways as described above going from your involved armpit to the same side groin and going across your upper chest from the involved shoulder to the uninvolved shoulder.  Repeat the steps above where you do circles in your right groin and left armpit. Copyright  VHI. All rights reserved.  

## 2017-11-07 ENCOUNTER — Ambulatory Visit: Payer: BLUE CROSS/BLUE SHIELD | Admitting: Physical Therapy

## 2017-11-13 ENCOUNTER — Ambulatory Visit: Payer: BLUE CROSS/BLUE SHIELD | Admitting: Physical Therapy

## 2017-11-15 ENCOUNTER — Ambulatory Visit: Payer: BLUE CROSS/BLUE SHIELD | Admitting: Physical Therapy

## 2017-11-15 DIAGNOSIS — M25611 Stiffness of right shoulder, not elsewhere classified: Secondary | ICD-10-CM | POA: Diagnosis not present

## 2017-11-15 DIAGNOSIS — M6281 Muscle weakness (generalized): Secondary | ICD-10-CM | POA: Diagnosis not present

## 2017-11-15 DIAGNOSIS — I890 Lymphedema, not elsewhere classified: Secondary | ICD-10-CM

## 2017-11-15 DIAGNOSIS — R293 Abnormal posture: Secondary | ICD-10-CM | POA: Diagnosis not present

## 2017-11-15 DIAGNOSIS — M25511 Pain in right shoulder: Secondary | ICD-10-CM | POA: Diagnosis not present

## 2017-11-15 NOTE — Therapy (Signed)
[place], [place], [address] Phone: [phone]   Fax:  [phone]  Physical Therapy Treatment  Patient Details  Name: [name] MRN: [id number] Date of Birth: 16-Apr-1946 Referring Provider: Barry Dienes   Encounter Date: 11/15/2017  PT End of Session - 11/15/17 1719    Visit Number  7    Number of Visits  13    Date for PT Re-Evaluation  12/18/17    PT Start Time  2683    PT Stop Time  1603    PT Time Calculation (min)  40 min    Activity Tolerance  Patient tolerated treatment well    Behavior During Therapy  [facility] for tasks assessed/performed       Past Medical History:  Diagnosis Date  . Arthritis   . Breast cancer ([place])    R lumpectomy ; radiation; no chemotherapy  . Bronchitis    recurrent  . Chronic low back pain    bulging disc & spinal stenosis  . Fibromyalgia   . Hyperlipemia   . Osteoporosis   . Pneumonia    as OP X 2; no Pneumovax  . Vitamin D deficiency    19 in  01/2009    Past Surgical History:  Procedure Laterality Date  . ABDOMINAL HYSTERECTOMY  1990   USO for fibroid , Dr Maryruth Eve  . BREAST SURGERY  2010   R side lumpectomy; Dr Margot Chimes  . CATARACT EXTRACTION, BILATERAL  2017  . CESAREAN SECTION      G 2 P 2  . CYSTECTOMY  2010  . EYE SURGERY     eye lid surgery  . no colonoscopy     "I just don't want to do it". SOC reviewed    There were no vitals filed for this visit.  Subjective Assessment - 11/15/17 1525    Subjective  Wants another foam pad to wear in the bra.    Pertinent History  Pt with history of right breast cancer s/p lumpectomy in 2010. Pt completed radiation therapy in 2011 and has been having edema in right breast since 2012    Currently in Pain?  Yes fibromyalgia                      [facility] Adult PT Treatment/Exercise - 11/15/17 0001      Self-Care   Other Self-Care Comments   cut oval in 1/2 inch gray foam and placed in  thin stockinette, then assisted patient to place it in her bra pocket to provide added compression to outer right breast      Manual Therapy   Manual Lymphatic Drainage (MLD)  In supine, short neck, superficial and deep abdominals, left  axillary nodes, anterior interaxillary anastamosis, right inguinal nodes right axillo-inguinal anastamosis,  right breast, directing toward pathways; then to sidelying for more work on lateral breast and posterior interaxillary anastamosis as well as right axillo-inguinal anastomosis, then finished in supine ending with anastomoses                     Long Term Clinic Goals - 11/15/17 1725      CC Long Term Goal  #1   Title  Pt will receive a trial of FlexiTouch compression pump for long term management of edema    Status  Achieved      CC Long Term Goal  #2   Title  Pt will be independent in self manual lymphatic  drainage technique.    Status  Achieved      CC Long Term Goal  #3   Title  Pt will report a 50% improvement in right breast lymphedema to decrease risk of infection.    Status  On-going      CC Long Term Goal  #4   Title  Pt to be indepent in a home exercise program for continued strengthening and stretching    Baseline  Does a gentle fitness class twice a week and feels she gets what she needs there.    Status  Achieved      CC Long Term Goal  #5   Title  Pt will demonstrate 160 degrees of right shoulder abduction to allow pt to reach out to side    Status  Partially Met         Plan - 11/15/17 1721    Clinical Impression Statement  Pt. asked for a new foam piece to use in her bra for added compression.  She was wearing a better bra today for compression.  She has told [employer] that she probably wants to order one, but after the 1st of the year.  She seems to better understand the technique for self-manual lymph drainage, though she said today that it doesn't make sense to her.  Therapist explained how it works.  Pt.  felt it would be more helpful for her today if the therapist performed it than she performing it with guidance.    Rehab Potential  Good    Clinical Impairments Affecting Rehab Potential  lumpectomy was 8 years ago    PT Frequency  2x / week    PT Duration  3 weeks    PT Treatment/Interventions  ADLs/Self Care Home Management;Iontophoresis '4mg'$ /ml Dexamethasone;Therapeutic exercise;Therapeutic activities;Patient/family education;Manual lymph drainage;Manual techniques;Passive range of motion;Vasopneumatic Device    PT Next Visit Plan  Cont MLD to right breast, reassess pt's self drainage technique, ask about cami with shelf bra    Consulted and Agree with Plan of Care  Patient       Patient will benefit from skilled therapeutic intervention in order to improve the following deficits and impairments:  Increased fascial restricitons, Pain, Postural dysfunction, Impaired UE functional use, Decreased strength, Decreased range of motion, Increased edema  Visit Diagnosis: Lymphedema, not elsewhere classified - Plan: PT plan of care cert/re-cert     Problem List Patient Active Problem List   Diagnosis Date Noted  . 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  . Intermittent palpitations 10/11/2017  . Intermittent chest pain 10/11/2017  . Migraine 12/30/2015  . Upper airway cough syndrome 10/29/2015  . Osteoporosis 06/06/2011  . Vitamin D deficiency 03/22/2010  . BREAST CANCER, HX OF 03/22/2010  . SPINAL STENOSIS, LUMBAR 03/25/2008  . HYPERLIPIDEMIA 09/28/2007  . Fibromyalgia 05/24/2007    SALISBURY,DONNA 11/15/2017, 5:28 PM  [place], [place], [address] Phone: [phone]   Fax:  [phone]  Name: [name] MRN: [id number] Date of Birth: 1945/12/31  Serafina Royals, PT 11/15/17 5:28 PM

## 2017-11-20 ENCOUNTER — Ambulatory Visit: Payer: BLUE CROSS/BLUE SHIELD | Attending: General Surgery | Admitting: Physical Therapy

## 2017-11-20 DIAGNOSIS — M25611 Stiffness of right shoulder, not elsewhere classified: Secondary | ICD-10-CM | POA: Insufficient documentation

## 2017-11-20 DIAGNOSIS — I890 Lymphedema, not elsewhere classified: Secondary | ICD-10-CM | POA: Insufficient documentation

## 2017-11-20 NOTE — Therapy (Signed)
[place], [place], [address] Phone: [phone]   Fax:  [phone]  Physical Therapy Treatment  Patient Details  Name: [name] MRN: [id number] Date of Birth: [birth year] Referring Provider: Barry Dienes   Encounter Date: 11/20/2017  PT End of Session - 11/20/17 1714    Visit Number  8    Date for PT Re-Evaluation  12/18/17    PT Start Time  1301    PT Stop Time  1343    PT Time Calculation (min)  42 min    Activity Tolerance  Patient tolerated treatment well    Behavior During Therapy  [facility] for tasks assessed/performed       Past Medical History:  Diagnosis Date  . Arthritis   . Breast cancer ([place])    R lumpectomy ; radiation; no chemotherapy  . Bronchitis    recurrent  . Chronic low back pain    bulging disc & spinal stenosis  . Fibromyalgia   . Hyperlipemia   . Osteoporosis   . Pneumonia    as OP X 2; no Pneumovax  . Vitamin D deficiency    19 in  01/2009    Past Surgical History:  Procedure Laterality Date  . ABDOMINAL HYSTERECTOMY  1990   USO for fibroid , Dr Maryruth Eve  . BREAST SURGERY  2010   R side lumpectomy; Dr Margot Chimes  . CATARACT EXTRACTION, BILATERAL  2017  . CESAREAN SECTION      G 2 P 2  . CYSTECTOMY  2010  . EYE SURGERY     eye lid surgery  . no colonoscopy     "I just don't want to do it". SOC reviewed    There were no vitals filed for this visit.  Subjective Assessment - 11/20/17 1303    Subjective  Nothing new.  The foam pad was tolerable.    Currently in Pain?  No/denies         [facility] PT Assessment - 11/20/17 0001      AROM   Right Shoulder ABduction  163 Degrees                  [facility] Adult PT Treatment/Exercise - 11/20/17 0001      Manual Therapy   Manual Lymphatic Drainage (MLD)  In supine, short neck, superficial and deep abdominals, left  axillary nodes, anterior interaxillary anastamosis, right inguinal nodes right  axillo-inguinal anastamosis,  right breast, directing toward pathways; then to sidelying for more work on lateral breast and posterior interaxillary anastamosis as well as right axillo-inguinal anastomosis, then finished in supine ending with anastomoses    Passive ROM  in supine, left shoulder into er, abduction, and flexion abduction painful                     Long Term Clinic Goals - 11/20/17 1304      CC Long Term Goal  #1   Title  Pt will receive a trial of FlexiTouch compression pump for long term management of edema    Baseline  Plans to pursue this after the 1st of the year.    Status  Achieved      CC Long Term Goal  #2   Title  Pt will be independent in self manual lymphatic drainage technique.    Baseline  Pt. is not confident in how she does this and thinks getting the pump will help her.    Status  Achieved  CC Long Term Goal  #3   Title  Pt will report a 50% improvement in right breast lymphedema to decrease risk of infection.    Baseline  no change as of 11/01/17; some better as of 11/20/17, but pt. is unable to quantify it      CC Long Term Goal  #4   Title  Pt to be indepent in a home exercise program for continued strengthening and stretching    Baseline  Does a gentle fitness class twice a week and feels she gets what she needs there.    Status  Achieved      CC Long Term Goal  #5   Title  Pt will demonstrate 160 degrees of right shoulder abduction to allow pt to reach out to side    Baseline  130 at eval; 150 on 11/01/17; 163 on 11/20/17    Status  Achieved         Plan - 11/20/17 1715    Clinical Impression Statement  Pt. feels that therapy has definitely helped her breast swelling, although she feels unable to report to what degree (e.g., 50%).  She has met many of her goals, including shoulder abduction ROM improvement.  She will pursue a Flexitouch in January.  She seems not to have maximized the benefit of therapy sessions yet.    Rehab  Potential  Good    Clinical Impairments Affecting Rehab Potential  lumpectomy was 8 years ago    PT Frequency  2x / week    PT Duration  3 weeks    PT Treatment/Interventions  ADLs/Self Care Home Management;Iontophoresis '4mg'$ /ml Dexamethasone;Therapeutic exercise;Therapeutic activities;Patient/family education;Manual lymph drainage;Manual techniques;Passive range of motion;Vasopneumatic Device    PT Next Visit Plan  Cont MLD to right breast, reassess pt's self drainage technique, ask about cami with shelf bra    Consulted and Agree with Plan of Care  Patient       Patient will benefit from skilled therapeutic intervention in order to improve the following deficits and impairments:  Increased fascial restricitons, Pain, Postural dysfunction, Impaired UE functional use, Decreased strength, Decreased range of motion, Increased edema  Visit Diagnosis: Lymphedema, not elsewhere classified  Stiffness of right shoulder, not elsewhere classified     Problem List Patient Active Problem List   Diagnosis Date Noted  . 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  . Intermittent palpitations 10/11/2017  . Intermittent chest pain 10/11/2017  . Migraine 12/30/2015  . Upper airway cough syndrome 10/29/2015  . Osteoporosis 06/06/2011  . Vitamin D deficiency 03/22/2010  . BREAST CANCER, HX OF 03/22/2010  . SPINAL STENOSIS, LUMBAR 03/25/2008  . HYPERLIPIDEMIA 09/28/2007  . Fibromyalgia 05/24/2007    Mathieu Schloemer 11/20/2017, 5:20 PM  [place], [place], [address] Phone: [phone]   Fax:  [phone]  Name: [name] MRN: [id number] Date of Birth: 12-21-1945  Serafina Royals, PT 11/20/17 5:20 PM

## 2017-11-22 ENCOUNTER — Ambulatory Visit: Payer: BLUE CROSS/BLUE SHIELD | Admitting: Physical Therapy

## 2017-11-22 DIAGNOSIS — M25611 Stiffness of right shoulder, not elsewhere classified: Secondary | ICD-10-CM | POA: Diagnosis not present

## 2017-11-22 DIAGNOSIS — I890 Lymphedema, not elsewhere classified: Secondary | ICD-10-CM

## 2017-11-22 NOTE — Therapy (Signed)
[place], [place], [address] Phone: [phone]   Fax:  [phone]  Physical Therapy Treatment  Patient Details  Name: [name] MRN: [id number] Date of Birth: 1946-02-01 Referring Provider: Barry Dienes   Encounter Date: 11/22/2017  PT End of Session - 11/22/17 1719    Visit Number  9    Number of Visits  13    Date for PT Re-Evaluation  12/18/17    PT Start Time  1350    PT Stop Time  1428    PT Time Calculation (min)  38 min    Activity Tolerance  Patient tolerated treatment well    Behavior During Therapy  [facility] for tasks assessed/performed       Past Medical History:  Diagnosis Date  . Arthritis   . Breast cancer ([place])    R lumpectomy ; radiation; no chemotherapy  . Bronchitis    recurrent  . Chronic low back pain    bulging disc & spinal stenosis  . Fibromyalgia   . Hyperlipemia   . Osteoporosis   . Pneumonia    as OP X 2; no Pneumovax  . Vitamin D deficiency    19 in  01/2009    Past Surgical History:  Procedure Laterality Date  . ABDOMINAL HYSTERECTOMY  1990   USO for fibroid , Dr Maryruth Eve  . BREAST SURGERY  2010   R side lumpectomy; Dr Margot Chimes  . CATARACT EXTRACTION, BILATERAL  2017  . CESAREAN SECTION      G 2 P 2  . CYSTECTOMY  2010  . EYE SURGERY     eye lid surgery  . no colonoscopy     "I just don't want to do it". SOC reviewed    There were no vitals filed for this visit.  Subjective Assessment - 11/22/17 1352    Subjective  Nothing new.  I can tell a difference.    Pertinent History  Pt with history of right breast cancer s/p lumpectomy in 2010. Pt completed radiation therapy in 2011 and has been having edema in right breast since 2012    Currently in Pain?  No/denies                      [facility] Adult PT Treatment/Exercise - 11/22/17 0001      Manual Therapy   Manual Therapy  Myofascial release    Myofascial Release  crosshands from left  axilla to left flank; unidirectional release at upper left axilla    Manual Lymphatic Drainage (MLD)  In supine, short neck, superficial and deep abdominals, left  axillary nodes, anterior interaxillary anastamosis, right inguinal nodes right axillo-inguinal anastamosis,  right breast, directing toward pathways; then to sidelying for more work on lateral breast and posterior interaxillary anastamosis as well as right axillo-inguinal anastomosis, then finished in supine ending with anastomoses, axilla and groin.    Passive ROM  in supine, left shoulder into er, abduction, and flexion abduction painful                     Long Term Clinic Goals - 11/20/17 1304      CC Long Term Goal  #1   Title  Pt will receive a trial of FlexiTouch compression pump for long term management of edema    Baseline  Plans to pursue this after the 1st of the year.    Status  Achieved      CC  Long Term Goal  #2   Title  Pt will be independent in self manual lymphatic drainage technique.    Baseline  Pt. is not confident in how she does this and thinks getting the pump will help her.    Status  Achieved      CC Long Term Goal  #3   Title  Pt will report a 50% improvement in right breast lymphedema to decrease risk of infection.    Baseline  no change as of 11/01/17; some better as of 11/20/17, but pt. is unable to quantify it      CC Long Term Goal  #4   Title  Pt to be indepent in a home exercise program for continued strengthening and stretching    Baseline  Does a gentle fitness class twice a week and feels she gets what she needs there.    Status  Achieved      CC Long Term Goal  #5   Title  Pt will demonstrate 160 degrees of right shoulder abduction to allow pt to reach out to side    Baseline  130 at eval; 150 on 11/01/17; 163 on 11/20/17    Status  Achieved         Plan - 11/22/17 1720    Clinical Impression Statement  Pt. continues to report benefit of therapy for her breast swelling.     Rehab Potential  Good    Clinical Impairments Affecting Rehab Potential  lumpectomy was 8 years ago    PT Frequency  2x / week    PT Duration  3 weeks    PT Treatment/Interventions  ADLs/Self Care Home Management;Iontophoresis 4mg /ml Dexamethasone;Therapeutic exercise;Therapeutic activities;Patient/family education;Manual lymph drainage;Manual techniques;Passive range of motion;Vasopneumatic Device    PT Next Visit Plan  G-code next; Cont MLD to right breast, reassess pt's self drainage technique, ask about cami with shelf bra    Consulted and Agree with Plan of Care  Patient       Patient will benefit from skilled therapeutic intervention in order to improve the following deficits and impairments:  Increased fascial restricitons, Pain, Postural dysfunction, Impaired UE functional use, Decreased strength, Decreased range of motion, Increased edema  Visit Diagnosis: Lymphedema, not elsewhere classified  Stiffness of right shoulder, not elsewhere classified     Problem List Patient Active Problem List   Diagnosis Date Noted  . 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  . Intermittent palpitations 10/11/2017  . Intermittent chest pain 10/11/2017  . Migraine 12/30/2015  . Upper airway cough syndrome 10/29/2015  . Osteoporosis 06/06/2011  . Vitamin D deficiency 03/22/2010  . BREAST CANCER, HX OF 03/22/2010  . SPINAL STENOSIS, LUMBAR 03/25/2008  . HYPERLIPIDEMIA 09/28/2007  . Fibromyalgia 05/24/2007    Jazmyn Offner 11/22/2017, 5:21 PM  [place], [place], [address] Phone: [phone]   Fax:  [phone]  Name: [name] MRN: [id number] Date of Birth: 12/28/1945  Serafina Royals, PT 11/22/17 5:21 PM

## 2017-12-05 ENCOUNTER — Ambulatory Visit: Payer: BLUE CROSS/BLUE SHIELD | Admitting: Physical Therapy

## 2017-12-05 ENCOUNTER — Ambulatory Visit
Admission: RE | Admit: 2017-12-05 | Discharge: 2017-12-05 | Disposition: A | Discharge status: Home / Self Care | Payer: BLUE CROSS/BLUE SHIELD | Source: Ambulatory Visit | Attending: General Surgery | Admitting: General Surgery

## 2017-12-05 VITALS — BP 140/90

## 2017-12-05 DIAGNOSIS — Z1231 Encounter for screening mammogram for malignant neoplasm of breast: Secondary | ICD-10-CM | POA: Diagnosis not present

## 2017-12-05 DIAGNOSIS — I890 Lymphedema, not elsewhere classified: Secondary | ICD-10-CM | POA: Diagnosis not present

## 2017-12-05 DIAGNOSIS — M25611 Stiffness of right shoulder, not elsewhere classified: Secondary | ICD-10-CM

## 2017-12-05 HISTORY — DX: Personal history of irradiation: Z92.3

## 2017-12-05 NOTE — Therapy (Signed)
[place], [place], [address] Phone: [phone]   Fax:  [phone]  Physical Therapy Treatment  Patient Details  Name: [name] MRN: [id number] Date of Birth: [birth year] Referring Provider: Barry Dienes   Encounter Date: 12/05/2017  PT End of Session - 12/05/17 1739    Visit Number  10    Number of Visits  13    Date for PT Re-Evaluation  12/18/17    PT Start Time  7169    PT Stop Time  1514    PT Time Calculation (min)  40 min    Activity Tolerance  Patient tolerated treatment well    Behavior During Therapy  [facility] for tasks assessed/performed       Past Medical History:  Diagnosis Date  . Arthritis   . Breast cancer ([place])    R lumpectomy ; radiation; no chemotherapy  . Bronchitis    recurrent  . Chronic low back pain    bulging disc & spinal stenosis  . Fibromyalgia   . Hyperlipemia   . Osteoporosis   . Personal history of radiation therapy   . Pneumonia    as OP X 2; no Pneumovax  . Vitamin D deficiency    19 in  01/2009    Past Surgical History:  Procedure Laterality Date  . ABDOMINAL HYSTERECTOMY  1990   USO for fibroid , Dr Maryruth Eve  . BREAST LUMPECTOMY Right   . BREAST SURGERY  2010   R side lumpectomy; Dr Margot Chimes  . CATARACT EXTRACTION, BILATERAL  2017  . CESAREAN SECTION      G 2 P 2  . CYSTECTOMY  2010  . EYE SURGERY     eye lid surgery  . no colonoscopy     "I just don't want to do it". SOC reviewed    Vitals:   12/05/17 1440  BP: 140/90    Subjective Assessment - 12/05/17 1435    Subjective  Staying busy. Feels like her blood pressure is high, so this was checked today.    Pertinent History  Pt with history of right breast cancer s/p lumpectomy in 2010. Pt completed radiation therapy in 2011 and has been having edema in right breast since 2012    Currently in Pain?  No/denies                      [facility] Adult PT Treatment/Exercise -  12/05/17 0001      Manual Therapy   Myofascial Release  crosshands from right axilla to left flank;    Manual Lymphatic Drainage (MLD)  In supine, short neck, superficial and deep abdominals, left  axillary nodes, anterior interaxillary anastamosis, right inguinal nodes right axillo-inguinal anastamosis,  right breast, directing toward pathways; then to sidelying for more work on lateral breast and posterior interaxillary anastamosis as well as right axillo-inguinal anastomosis, then finished in supine ending with anastomoses, axilla and groin.    Passive ROM  in supine, right shoulder into er, abduction, and flexion note from last time should read right instead of left                     [place] - 11/20/17 1304      CC Long Term Goal  #1   Title  Pt will receive a trial of FlexiTouch compression pump for long term management of edema    Baseline  Plans to pursue this after the  1st of the year.    Status  Achieved      CC Long Term Goal  #2   Title  Pt will be independent in self manual lymphatic drainage technique.    Baseline  Pt. is not confident in how she does this and thinks getting the pump will help her.    Status  Achieved      CC Long Term Goal  #3   Title  Pt will report a 50% improvement in right breast lymphedema to decrease risk of infection.    Baseline  no change as of 11/01/17; some better as of 11/20/17, but pt. is unable to quantify it      CC Long Term Goal  #4   Title  Pt to be indepent in a home exercise program for continued strengthening and stretching    Baseline  Does a gentle fitness class twice a week and feels she gets what she needs there.    Status  Achieved      CC Long Term Goal  #5   Title  Pt will demonstrate 160 degrees of right shoulder abduction to allow pt to reach out to side    Baseline  130 at eval; 150 on 11/01/17; 163 on 11/20/17    Status  Achieved         Plan - 12/05/17 1740    Clinical Impression  Statement  Continued manual lymph drainage, which patient reports benefit from; also some work on right shoulder ROM.    Rehab Potential  Good    Clinical Impairments Affecting Rehab Potential  lumpectomy was 8 years ago    PT Frequency  2x / week    PT Duration  3 weeks    PT Treatment/Interventions  ADLs/Self Care Home Management;Iontophoresis 4mg /ml Dexamethasone;Therapeutic exercise;Therapeutic activities;Patient/family education;Manual lymph drainage;Manual techniques;Passive range of motion;Vasopneumatic Device    PT Next Visit Plan  Cont MLD to right breast, reassess pt's self drainage technique, ask about cami with shelf bra    Consulted and Agree with Plan of Care  Patient       Patient will benefit from skilled therapeutic intervention in order to improve the following deficits and impairments:  Increased fascial restricitons, Pain, Postural dysfunction, Impaired UE functional use, Decreased strength, Decreased range of motion, Increased edema  Visit Diagnosis: Lymphedema, not elsewhere classified  Stiffness of right shoulder, not elsewhere classified     Problem List Patient Active Problem List   Diagnosis Date Noted  . 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  . Intermittent palpitations 10/11/2017  . Intermittent chest pain 10/11/2017  . Migraine 12/30/2015  . Upper airway cough syndrome 10/29/2015  . Osteoporosis 06/06/2011  . Vitamin D deficiency 03/22/2010  . BREAST CANCER, HX OF 03/22/2010  . SPINAL STENOSIS, LUMBAR 03/25/2008  . HYPERLIPIDEMIA 09/28/2007  . Fibromyalgia 05/24/2007    SALISBURY,DONNA 12/05/2017, 5:47 PM  [place], [place], [address] Phone: [phone]   Fax:  [phone]  Name: [name] MRN: [id number] Date of Birth: 1946/12/02  Serafina Royals, PT 12/05/17 5:47 PM

## 2017-12-27 ENCOUNTER — Ambulatory Visit: Payer: BLUE CROSS/BLUE SHIELD | Attending: General Surgery | Admitting: Physical Therapy

## 2017-12-27 DIAGNOSIS — I890 Lymphedema, not elsewhere classified: Secondary | ICD-10-CM | POA: Insufficient documentation

## 2017-12-27 DIAGNOSIS — M25611 Stiffness of right shoulder, not elsewhere classified: Secondary | ICD-10-CM | POA: Diagnosis not present

## 2017-12-27 NOTE — Therapy (Addendum)
[place], [place], [address] Phone: [phone]   Fax:  [phone]  Physical Therapy Treatment  Patient Details  Name: [name] MRN: [id number] Date of Birth: February 08, 1946 Referring Provider: Barry Dienes   Encounter Date: 12/27/2017  PT End of Session - 12/27/17 2053    Visit Number  11    Number of Visits  23    Date for PT Re-Evaluation  02/23/18    PT Start Time  1347    PT Stop Time  1431    PT Time Calculation (min)  44 min    Activity Tolerance  Patient tolerated treatment well    Behavior During Therapy  [facility] for tasks assessed/performed       Past Medical History:  Diagnosis Date  . Arthritis   . Breast cancer ([place])    R lumpectomy ; radiation; no chemotherapy  . Bronchitis    recurrent  . Chronic low back pain    bulging disc & spinal stenosis  . Fibromyalgia   . Hyperlipemia   . Osteoporosis   . Personal history of radiation therapy   . Pneumonia    as OP X 2; no Pneumovax  . Vitamin D deficiency    18 in  01/2009    Past Surgical History:  Procedure Laterality Date  . ABDOMINAL HYSTERECTOMY  1990   USO for fibroid , Dr Maryruth Eve  . BREAST LUMPECTOMY Right   . BREAST SURGERY  2010   R side lumpectomy; Dr Margot Chimes  . CATARACT EXTRACTION, BILATERAL  2017  . CESAREAN SECTION      G 2 P 2  . CYSTECTOMY  2010  . EYE SURGERY     eye lid surgery  . no colonoscopy     "I just don't want to do it". SOC reviewed    There were no vitals filed for this visit.  Subjective Assessment - 12/27/17 1350    Subjective  Nothing new since being away except she is getting tingling in whole right hand that comes and goes. Is going to pursue to the pump this year, but just not right now. Didn't notice a particular change in her status while away over the holiday for three weeks. Says she has been doing well with wearing the foam in her bra.    Pertinent History  Pt with history of right  breast cancer s/p lumpectomy in 2010. Pt completed radiation therapy in 2011 and has been having edema in right breast since 2012    Currently in Pain?  No/denies                      [facility] Adult PT Treatment/Exercise - 12/27/17 0001      Manual Therapy   Manual Lymphatic Drainage (MLD)  In supine, short neck, superficial and deep abdominals, left  axillary nodes, anterior interaxillary anastamosis, right inguinal nodes right axillo-inguinal anastamosis,  right breast, directing toward pathways; then to sidelying for more work on lateral breast and posterior interaxillary anastamosis as well as right axillo-inguinal anastomosis.    Passive ROM  in supine, right shoulder into er, abduction, and flexion note from last time should read right instead of left                     [place] - 12/27/17 2058      CC Long Term Goal  #1   Title  Pt will receive a  trial of FlexiTouch compression pump for long term management of edema    Status  Achieved      CC Long Term Goal  #2   Title  Pt will be independent in self manual lymphatic drainage technique.    Status  Achieved      CC Long Term Goal  #3   Title  Pt will report a 50% improvement in right breast lymphedema to decrease risk of infection.    Baseline  no change as of 11/01/17; some better as of 11/20/17, but pt. is unable to quantify it; about 60% better as of 12/27/17.     Status  Achieved      CC Long Term Goal  #4   Title  Pt to be indepent in a home exercise program for continued strengthening and stretching    Baseline  Does a gentle fitness class twice a week and feels she gets what she needs there.    Status  Achieved      CC Long Term Goal  #5   Title  Pt will demonstrate 160 degrees of right shoulder abduction to allow pt to reach out to side    Baseline  130 at eval; 150 on 11/01/17; 163 on 11/20/17    Status  Achieved      CC Long Term Goal  #6   Title  Pt. will report perception  of 65% or more improvement in right breast swelling.    Time  8    Period  Weeks    Status  New      CC Long Term Goal  #7   Title  Pt. will have a definite plan in place for obtaining Flexitouch lymphedema pump.    Time  8    Period  Weeks    Status  New      Additional Goals   Additional Goals  Yes         Plan - 12/27/17 2054    Clinical Impression Statement  Pt.  has been away for three weeks or so over the holidays. She reports she has done her self-manual lymph drainage .She doesn't note any significant change over this period. She plans to get a Flexitouch lymphedema pump this year, but isn't quite sure when that will be.  She does feel therapy has been helping her, and has met her goals, including 50% perceived reduction in breast swelling, so new, more advanced, goals were written today.  She will be in town only intermittently over this two month authorization period, but will continue therapy during that time.    Rehab Potential  Good    Clinical Impairments Affecting Rehab Potential  lumpectomy was 8 years ago    PT Frequency  2x / week up to this frequency    PT Duration  8 weeks    PT Treatment/Interventions  ADLs/Self Care Home Management;Iontophoresis '4mg'$ /ml Dexamethasone;Therapeutic exercise;Therapeutic activities;Patient/family education;Manual lymph drainage;Manual techniques;Passive range of motion;Vasopneumatic Device    PT Next Visit Plan  Cont MLD to right breast, reassess pt's self drainage technique. right shoulder ROM    Consulted and Agree with Plan of Care  Patient       Patient will benefit from skilled therapeutic intervention in order to improve the following deficits and impairments:  Increased fascial restricitons, Pain, Postural dysfunction, Impaired UE functional use, Decreased strength, Decreased range of motion, Increased edema  Visit Diagnosis: Lymphedema, not elsewhere classified - Plan: PT plan of care cert/re-cert  Stiffness of  right  shoulder, not elsewhere classified - Plan: PT plan of care cert/re-cert     Problem List Patient Active Problem List   Diagnosis Date Noted  . 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  . Intermittent palpitations 10/11/2017  . Intermittent chest pain 10/11/2017  . Migraine 12/30/2015  . Upper airway cough syndrome 10/29/2015  . Osteoporosis 06/06/2011  . Vitamin D deficiency 03/22/2010  . BREAST CANCER, HX OF 03/22/2010  . SPINAL STENOSIS, LUMBAR 03/25/2008  . HYPERLIPIDEMIA 09/28/2007  . Fibromyalgia 05/24/2007    Breyon Sigg 12/27/2017, 9:04 PM  [place], [place], [address] Phone: [phone]   Fax:  [phone]  Name: [name] MRN: [id number] Date of Birth: 1946-05-14  Serafina Royals, PT 12/27/17 9:04 PM  PHYSICAL THERAPY DISCHARGE SUMMARY  Visits from Start of Care: 11  Current functional level related to goals / functional outcomes: Goals met, except newer goals that were added at this visit for follow-up.  Patient then did not return for any further therapy.   Remaining deficits: Unknown, as patient did not return for follow-up.   Education / Equipment: Self manual lymph drainage, info about pumps. Plan: Patient agrees to discharge.  Patient goals were partially met. Patient is being discharged due to not returning since the last visit.  ?????    Serafina Royals, PT 04/12/18 4:03 PM

## 2018-01-23 ENCOUNTER — Ambulatory Visit: Payer: BLUE CROSS/BLUE SHIELD | Admitting: Physical Therapy

## 2018-01-23 NOTE — Progress Notes (Signed)
Subjective:    Patient ID: [name], female    DOB: [birth year], 72 y.o.   MRN: [id number]  HPI The patient is here for an acute visit.  Back pain:  She fell two weeks ago on ice.  She fell down four metal steps.  She has had lower back pain that radiates down her left leg to the ankle since the fall. The pain has gotten worse.  The leg gives out at times, which she thinks it related to the pain.   She denies numbness/tingling.  She has taken gabapentin at night if the pain is bad.  She does have spinal stenosis and has had some back pain over the years, but not like this.      Medications and allergies reviewed with patient and updated if appropriate.  Patient Active Problem List   Diagnosis Date Noted  . 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  . Intermittent palpitations 10/11/2017  . Intermittent chest pain 10/11/2017  . Migraine 12/30/2015  . Upper airway cough syndrome 10/29/2015  . Osteoporosis 06/06/2011  . Vitamin D deficiency 03/22/2010  . BREAST CANCER, HX OF 03/22/2010  . SPINAL STENOSIS, LUMBAR 03/25/2008  . HYPERLIPIDEMIA 09/28/2007  . Fibromyalgia 05/24/2007    Current Outpatient Medications on File Prior to Visit  Medication Sig Dispense Refill  . RELPAX 20 MG tablet TAKE 1 TABLET BY MOUTH AT ONSET OF HEADACHE. MAY REPEAT 1 TABLET IN 2 HOURS IF HEADACHE PERSISTS OR RECURS 9 tablet 0  . traZODone (DESYREL) 50 MG tablet TAKE 3 TABLETS(150 MG) BY MOUTH AT BEDTIME 270 tablet 1   No current facility-administered medications on file prior to visit.     Past Medical History:  Diagnosis Date  . Arthritis   . Breast cancer ([place])    R lumpectomy ; radiation; no chemotherapy  . Bronchitis    recurrent  . Chronic low back pain    bulging disc & spinal stenosis  . Fibromyalgia   . Hyperlipemia   . Osteoporosis   . Personal history of radiation therapy   . Pneumonia    as OP X 2; no Pneumovax  . Vitamin D deficiency    18 in  01/2009    Past Surgical  History:  Procedure Laterality Date  . ABDOMINAL HYSTERECTOMY  1990   USO for fibroid , Dr Maryruth Eve  . BREAST LUMPECTOMY Right   . BREAST SURGERY  2010   R side lumpectomy; Dr Margot Chimes  . CATARACT EXTRACTION, BILATERAL  2017  . CESAREAN SECTION      G 2 P 2  . CYSTECTOMY  2010  . EYE SURGERY     eye lid surgery  . no colonoscopy     "I just don't want to do it". SOC reviewed    Social History   Socioeconomic History  . Marital status: Married    Spouse name: None  . Number of children: None  . Years of education: None  . Highest education level: None  Social Needs  . Financial resource strain: None  . Food insecurity - worry: None  . Food insecurity - inability: None  . Transportation needs - medical: None  . Transportation needs - non-medical: None  Occupational History  . None  Tobacco Use  . Smoking status: Former Smoker    Last attempt to quit: 12/19/1970    Years since quitting: 47.1  . Smokeless tobacco: Never Used  . Tobacco comment: [id number], up to 3 cigarettes / day  Substance and  Sexual Activity  . Alcohol use: No  . Drug use: No  . Sexual activity: None  Other Topics Concern  . None  Social History Narrative  . None    Family History  Problem Relation Age of Onset  . Atrial fibrillation Mother        S/P cardioversion  . Ulcers Mother   . Hypertension Mother   . Dementia Mother   . Heart attack Father 63       died @ 58  . Heart attack Paternal Aunt        < 21  . Heart attack Paternal Grandfather        > 55  . Heart attack Paternal Uncle        > 55  . Heart attack Paternal Grandmother        < 48  . Hypertension Brother   . Thyroid disease Daughter        on Synthroid since high school  . Diabetes Brother   . Stroke Neg Hx   . Breast cancer Neg Hx     Review of Systems  Constitutional: Negative for chills and fever.  Gastrointestinal:       No bowel incontinence  Genitourinary:       No urinary incontinence  Musculoskeletal:  Positive for back pain.  Neurological: Negative for weakness and numbness.       Objective:   Vitals:   01/24/18 1115  BP: 104/62  Pulse: 66  Resp: 16  Temp: 98.1 F (36.7 C)  SpO2: 96%   Wt Readings from Last 3 Encounters:  01/24/18 127 lb (57.6 kg)  10/21/17 126 lb 1.3 oz (57.2 kg)  10/11/17 127 lb (57.6 kg)   Body mass index is 21.13 kg/m.   Physical Exam  Constitutional: She appears well-developed and well-nourished. No distress.  HENT:  Head: Normocephalic and atraumatic.  Musculoskeletal: She exhibits no edema.  No tenderness in lumbar spine and lower back  Neurological:  Normal sensation b/l LE  Skin: Skin is warm. She is not diaphoretic.           Assessment & Plan:    See Problem List for Assessment and Plan of chronic medical problems.

## 2018-01-24 ENCOUNTER — Ambulatory Visit (INDEPENDENT_AMBULATORY_CARE_PROVIDER_SITE_OTHER): Payer: BLUE CROSS/BLUE SHIELD | Admitting: Internal Medicine

## 2018-01-24 ENCOUNTER — Encounter: Payer: Self-pay | Admitting: Internal Medicine

## 2018-01-24 VITALS — BP 104/62 | HR 66 | Temp 98.1°F | Resp 16 | Wt 127.0 lb

## 2018-01-24 DIAGNOSIS — M5432 Sciatica, left side: Secondary | ICD-10-CM | POA: Diagnosis not present

## 2018-01-24 MED ORDER — GABAPENTIN 100 MG PO CAPS: 100.0000 mg | ORAL_CAPSULE | Freq: Every day | ORAL | 2 refills | Status: DC

## 2018-01-24 MED ORDER — PREDNISONE 20 MG PO TABS: 20.0000 mg | ORAL_TABLET | Freq: Every day | ORAL | 0 refills | Status: DC

## 2018-01-24 NOTE — Assessment & Plan Note (Signed)
Started two weeks after fall Discussed options for treatment - she can not take nsaids, will try prednisone 20 mg daily for 5 days, increase gabapentin - take at night - can take up to 300 mg at night Deferred PT If no improvement will refer to sports medicine Discussed to avoid bending, lifting and twisting Call if no improvement

## 2018-01-24 NOTE — Patient Instructions (Addendum)
Take gabapentin 100-300 mg at bedtime.  Take tylenol for the pain.  Take the prednisone first thing in the morning with food.      Sciatica Sciatica is pain, numbness, weakness, or tingling along the path of the sciatic nerve. The sciatic nerve starts in the lower back and runs down the back of each leg. The nerve controls the muscles in the lower leg and in the back of the knee. It also provides feeling (sensation) to the back of the thigh, the lower leg, and the sole of the foot. Sciatica is a symptom of another medical condition that pinches or puts pressure on the sciatic nerve. Generally, sciatica only affects one side of the body. Sciatica usually goes away on its own or with treatment. In some cases, sciatica may keep coming back (recur). What are the causes? This condition is caused by pressure on the sciatic nerve, or pinching of the sciatic nerve. This may be the result of:  A disk in between the bones of the spine (vertebrae) bulging out too far (herniated disk).  Age-related changes in the spinal disks (degenerative disk disease).  A pain disorder that affects a muscle in the buttock (piriformis syndrome).  Extra bone growth (bone spur) near the sciatic nerve.  An injury or break (fracture) of the pelvis.  Pregnancy.  Tumor (rare).  What increases the risk? The following factors may make you more likely to develop this condition:  Playing sports that place pressure or stress on the spine, such as football or weight lifting.  Having poor strength and flexibility.  A history of back injury.  A history of back surgery.  Sitting for long periods of time.  Doing activities that involve repetitive bending or lifting.  Obesity.  What are the signs or symptoms? Symptoms can vary from mild to very severe, and they may include:  Any of these problems in the lower back, leg, hip, or buttock: ? Mild tingling or dull aches. ? Burning sensations. ? Sharp  pains.  Numbness in the back of the calf or the sole of the foot.  Leg weakness.  Severe back pain that makes movement difficult.  These symptoms may get worse when you cough, sneeze, or laugh, or when you sit or stand for long periods of time. Being overweight may also make symptoms worse. In some cases, symptoms may recur over time. How is this diagnosed? This condition may be diagnosed based on:  Your symptoms.  A physical exam. Your health care provider may ask you to do certain movements to check whether those movements trigger your symptoms.  You may have tests, including: ? Blood tests. ? X-rays. ? MRI. ? CT scan.  How is this treated? In many cases, this condition improves on its own, without any treatment. However, treatment may include:  Reducing or modifying physical activity during periods of pain.  Exercising and stretching to strengthen your abdomen and improve the flexibility of your spine.  Icing and applying heat to the affected area.  Medicines that help: ? To relieve pain and swelling. ? To relax your muscles.  Injections of medicines that help to relieve pain, irritation, and inflammation around the sciatic nerve (steroids).  Surgery.  Follow these instructions at home: Medicines  Take over-the-counter and prescription medicines only as told by your health care provider.  Do not drive or operate heavy machinery while taking prescription pain medicine. Managing pain  If directed, apply ice to the affected area. ? Put ice in a  plastic bag. ? Place a towel between your skin and the bag. ? Leave the ice on for 20 minutes, 2-3 times a day.  After icing, apply heat to the affected area before you exercise or as often as told by your health care provider. Use the heat source that your health care provider recommends, such as a moist heat pack or a heating pad. ? Place a towel between your skin and the heat source. ? Leave the heat on for 20-30  minutes. ? Remove the heat if your skin turns bright red. This is especially important if you are unable to feel pain, heat, or cold. You may have a greater risk of getting burned. Activity  Return to your normal activities as told by your health care provider. Ask your health care provider what activities are safe for you. ? Avoid activities that make your symptoms worse.  Take brief periods of rest throughout the day. Resting in a lying or standing position is usually better than sitting to rest. ? When you rest for longer periods, mix in some mild activity or stretching between periods of rest. This will help to prevent stiffness and pain. ? Avoid sitting for long periods of time without moving. Get up and move around at least one time each hour.  Exercise and stretch regularly, as told by your health care provider.  Do not lift anything that is heavier than 10 lb (4.5 kg) while you have symptoms of sciatica. When you do not have symptoms, you should still avoid heavy lifting, especially repetitive heavy lifting.  When you lift objects, always use proper lifting technique, which includes: ? Bending your knees. ? Keeping the load close to your body. ? Avoiding twisting. General instructions  Use good posture. ? Avoid leaning forward while sitting. ? Avoid hunching over while standing.  Maintain a healthy weight. Excess weight puts extra stress on your back and makes it difficult to maintain good posture.  Wear supportive, comfortable shoes. Avoid wearing high heels.  Avoid sleeping on a mattress that is too soft or too hard. A mattress that is firm enough to support your back when you sleep may help to reduce your pain.  Keep all follow-up visits as told by your health care provider. This is important. Contact a health care provider if:  You have pain that wakes you up when you are sleeping.  You have pain that gets worse when you lie down.  Your pain is worse than you have  experienced in the past.  Your pain lasts longer than 4 weeks.  You experience unexplained weight loss. Get help right away if:  You lose control of your bowel or bladder (incontinence).  You have: ? Weakness in your lower back, pelvis, buttocks, or legs that gets worse. ? Redness or swelling of your back. ? A burning sensation when you urinate. This information is not intended to replace advice given to you by your health care provider. Make sure you discuss any questions you have with your health care provider. Document Released: 11/29/2001 Document Revised: 05/10/2016 Document Reviewed: 08/14/2015 Elsevier Interactive Patient Education  [employer].

## 2018-02-08 DIAGNOSIS — H04123 Dry eye syndrome of bilateral lacrimal glands: Secondary | ICD-10-CM | POA: Diagnosis not present

## 2018-03-01 NOTE — Progress Notes (Signed)
Subjective:    Patient ID: [name], female    DOB: Jan 30, 1946, 72 y.o.   MRN: [id number]  HPI She is here for an acute visit for cold symptoms.  Her symptoms started 5 weeks ago.   She thinks she had the flu initially.  She was not seen at that time.  It will not go away.  It feels like it settled in her throat.    She is experiencing chills, low grade fevers, nasal congestion, PND, sinus pressure, productive cough, and headaches.   She has taken tylenol as needed.    Medications and allergies reviewed with patient and updated if appropriate.  Patient Active Problem List   Diagnosis Date Noted  . Sciatica of left side 01/24/2018  . 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  . Intermittent palpitations 10/11/2017  . Intermittent chest pain 10/11/2017  . Migraine 12/30/2015  . Upper airway cough syndrome 10/29/2015  . Osteoporosis 06/06/2011  . Vitamin D deficiency 03/22/2010  . BREAST CANCER, HX OF 03/22/2010  . SPINAL STENOSIS, LUMBAR 03/25/2008  . HYPERLIPIDEMIA 09/28/2007  . Fibromyalgia 05/24/2007    Current Outpatient Medications on File Prior to Visit  Medication Sig Dispense Refill  . gabapentin (NEURONTIN) 100 MG capsule Take 1-3 capsules (100-300 mg total) by mouth at bedtime. 30 capsule 2  . RELPAX 20 MG tablet TAKE 1 TABLET BY MOUTH AT ONSET OF HEADACHE. MAY REPEAT 1 TABLET IN 2 HOURS IF HEADACHE PERSISTS OR RECURS 9 tablet 0  . traZODone (DESYREL) 50 MG tablet TAKE 3 TABLETS(150 MG) BY MOUTH AT BEDTIME 270 tablet 1   No current facility-administered medications on file prior to visit.     Past Medical History:  Diagnosis Date  . Arthritis   . Breast cancer ([place])    R lumpectomy ; radiation; no chemotherapy  . Bronchitis    recurrent  . Chronic low back pain    bulging disc & spinal stenosis  . Fibromyalgia   . Hyperlipemia   . Osteoporosis   . Personal history of radiation therapy   . Pneumonia    as OP X 2; no Pneumovax  . Vitamin D deficiency     18 in  01/2009    Past Surgical History:  Procedure Laterality Date  . ABDOMINAL HYSTERECTOMY  1990   USO for fibroid , Dr Maryruth Eve  . BREAST LUMPECTOMY Right   . BREAST SURGERY  2010   R side lumpectomy; Dr Margot Chimes  . CATARACT EXTRACTION, BILATERAL  2017  . CESAREAN SECTION      G 2 P 2  . CYSTECTOMY  2010  . EYE SURGERY     eye lid surgery  . no colonoscopy     "I just don't want to do it". SOC reviewed    Social History   Socioeconomic History  . Marital status: Married    Spouse name: None  . Number of children: None  . Years of education: None  . Highest education level: None  Social Needs  . Financial resource strain: None  . Food insecurity - worry: None  . Food insecurity - inability: None  . Transportation needs - medical: None  . Transportation needs - non-medical: None  Occupational History  . None  Tobacco Use  . Smoking status: Former Smoker    Last attempt to quit: 12/19/1970    Years since quitting: 47.2  . Smokeless tobacco: Never Used  . Tobacco comment: [id number], up to 3 cigarettes / day  Substance and  Sexual Activity  . Alcohol use: No  . Drug use: No  . Sexual activity: None  Other Topics Concern  . None  Social History Narrative  . None    Family History  Problem Relation Age of Onset  . Atrial fibrillation Mother        S/P cardioversion  . Ulcers Mother   . Hypertension Mother   . Dementia Mother   . Heart attack Father 66       died @ 1  . Heart attack Paternal Aunt        < 1  . Heart attack Paternal Grandfather        > 55  . Heart attack Paternal Uncle        > 55  . Heart attack Paternal Grandmother        < 16  . Hypertension Brother   . Thyroid disease Daughter        on Synthroid since high school  . Diabetes Brother   . Stroke Neg Hx   . Breast cancer Neg Hx     Review of Systems  Constitutional: Positive for chills. Negative for appetite change and fever (low grade in the evening).  HENT: Positive for  congestion, postnasal drip and sinus pressure. Negative for ear pain, sinus pain and sore throat (mildly raw).   Respiratory: Positive for cough (productive). Negative for chest tightness, shortness of breath and wheezing.   Cardiovascular: Negative for chest pain.  Gastrointestinal: Negative for diarrhea and nausea.  Musculoskeletal: Negative for myalgias.  Neurological: Positive for headaches (sinus headaches this week). Negative for dizziness and light-headedness.       Objective:   Vitals:   03/02/18 1018  BP: 124/84  Pulse: 75  Resp: 16  Temp: 98.8 F (37.1 C)  SpO2: 98%   Filed Weights   There is no height or weight on file to calculate BMI.  Wt Readings from Last 3 Encounters:  01/24/18 127 lb (57.6 kg)  10/21/17 126 lb 1.3 oz (57.2 kg)  10/11/17 127 lb (57.6 kg)     Physical Exam GENERAL APPEARANCE: Appears stated age, well appearing, NAD EYES: conjunctiva clear, no icterus HEENT: bilateral tympanic membranes and ear canals normal, oropharynx with no erythema, no thyromegaly, trachea midline, no cervical or supraclavicular lymphadenopathy LUNGS: Clear to auscultation without wheeze or crackles, unlabored breathing, good air entry bilaterally CARDIOVASCULAR: Normal S1,S2 without murmurs, no edema SKIN: warm, dry        Assessment & Plan:   See Problem List for Assessment and Plan of chronic medical problems.

## 2018-03-02 ENCOUNTER — Ambulatory Visit (INDEPENDENT_AMBULATORY_CARE_PROVIDER_SITE_OTHER): Payer: BLUE CROSS/BLUE SHIELD | Admitting: Internal Medicine

## 2018-03-02 ENCOUNTER — Encounter: Payer: Self-pay | Admitting: Internal Medicine

## 2018-03-02 VITALS — BP 124/84 | HR 75 | Temp 98.8°F | Resp 16

## 2018-03-02 DIAGNOSIS — J069 Acute upper respiratory infection, unspecified: Secondary | ICD-10-CM

## 2018-03-02 MED ORDER — BENZONATATE 200 MG PO CAPS
200.0000 mg | ORAL_CAPSULE | Freq: Three times a day (TID) | ORAL | 0 refills | Status: DC | PRN
Start: 2018-03-02 — End: 2018-04-16

## 2018-03-02 MED ORDER — CEFDINIR 300 MG PO CAPS: 300.0000 mg | ORAL_CAPSULE | Freq: Two times a day (BID) | ORAL | 0 refills | Status: DC

## 2018-03-02 NOTE — Patient Instructions (Addendum)
Take the antibiotic as prescribed.  Use the cough pills as needed.    Increase rest, fluids.  Take the over the counter cold medications as needed.   Call if no improvement

## 2018-03-02 NOTE — Assessment & Plan Note (Signed)
Concern for secondary bacterial infection following flu Will prescribe an antibiotic - omnicef x 10 days Tessalon perles prn Rest, fluids otc cold medication prn  Call if no improvement

## 2018-04-16 ENCOUNTER — Encounter: Payer: Self-pay | Admitting: Family

## 2018-04-16 ENCOUNTER — Ambulatory Visit (INDEPENDENT_AMBULATORY_CARE_PROVIDER_SITE_OTHER): Payer: BLUE CROSS/BLUE SHIELD | Admitting: Family

## 2018-04-16 VITALS — BP 132/80 | HR 76 | Temp 98.4°F | Ht 65.0 in

## 2018-04-16 DIAGNOSIS — J209 Acute bronchitis, unspecified: Secondary | ICD-10-CM | POA: Diagnosis not present

## 2018-04-16 MED ORDER — AZITHROMYCIN 250 MG PO TABS: ORAL_TABLET | ORAL | 0 refills | Status: DC

## 2018-04-16 MED ORDER — FLUTICASONE PROPIONATE 50 MCG/ACT NA SUSP: 2.0000 | Freq: Every day | NASAL | 6 refills | Status: DC

## 2018-04-16 NOTE — Progress Notes (Signed)
[name] is a 72 y.o. female with the following history as recorded in [employer]:  Patient Active Problem List   Diagnosis Date Noted  . Upper respiratory tract infection 03/02/2018  . Sciatica of left side 01/24/2018  . 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  . Intermittent palpitations 10/11/2017  . Intermittent chest pain 10/11/2017  . Migraine 12/30/2015  . Upper airway cough syndrome 10/29/2015  . Osteoporosis 06/06/2011  . Vitamin D deficiency 03/22/2010  . BREAST CANCER, HX OF 03/22/2010  . SPINAL STENOSIS, LUMBAR 03/25/2008  . HYPERLIPIDEMIA 09/28/2007  . Fibromyalgia 05/24/2007    Current Outpatient Medications  Medication Sig Dispense Refill  . gabapentin (NEURONTIN) 100 MG capsule Take 1-3 capsules (100-300 mg total) by mouth at bedtime. 30 capsule 2  . RELPAX 20 MG tablet TAKE 1 TABLET BY MOUTH AT ONSET OF HEADACHE. MAY REPEAT 1 TABLET IN 2 HOURS IF HEADACHE PERSISTS OR RECURS 9 tablet 0  . traZODone (DESYREL) 50 MG tablet TAKE 3 TABLETS(150 MG) BY MOUTH AT BEDTIME 270 tablet 1  . azithromycin (ZITHROMAX) 250 MG tablet 2 tabs po qd x 1 day; 1 tablet per day x 4 days; 6 tablet 0  . fluticasone (FLONASE) 50 MCG/ACT nasal spray Place 2 sprays into both nostrils daily. 16 g 6   No current facility-administered medications for this visit.     Allergies: Morphine; Albuterol; Augmentin [amoxicillin-pot clavulanate]; Ciprofloxacin; Fish allergy; Risedronate sodium; and Ibandronate sodium  Past Medical History:  Diagnosis Date  . Arthritis   . Breast cancer ([place])    R lumpectomy ; radiation; no chemotherapy  . Bronchitis    recurrent  . Chronic low back pain    bulging disc & spinal stenosis  . Fibromyalgia   . Hyperlipemia   . Osteoporosis   . Personal history of radiation therapy   . Pneumonia    as OP X 2; no Pneumovax  . Vitamin D deficiency    18 in  01/2009    Past Surgical History:  Procedure Laterality Date  . ABDOMINAL HYSTERECTOMY  1990   [facility]O for  fibroid , Dr Maryruth Eve  . BREAST LUMPECTOMY Right   . BREAST SURGERY  2010   R side lumpectomy; Dr Margot Chimes  . CATARACT EXTRACTION, BILATERAL  2017  . CESAREAN SECTION      G 2 P 2  . CYSTECTOMY  2010  . EYE SURGERY     eye lid surgery  . no colonoscopy     "I just don't want to do it". SOC reviewed    Family History  Problem Relation Age of Onset  . Atrial fibrillation Mother        S/P cardioversion  . Ulcers Mother   . Hypertension Mother   . Dementia Mother   . Heart attack Father 50       died @ 53  . Heart attack Paternal Aunt        < 7  . Heart attack Paternal Grandfather        > 55  . Heart attack Paternal Uncle        > 55  . Heart attack Paternal Grandmother        < 29  . Hypertension Brother   . Thyroid disease Daughter        on Synthroid since high school  . Diabetes Brother   . Stroke Neg Hx   . Breast cancer Neg Hx     Social History   Tobacco Use  .  Smoking status: Former Smoker    Last attempt to quit: 12/19/1970    Years since quitting: 47.3  . Smokeless tobacco: Never Used  . Tobacco comment: [id number], up to 3 cigarettes / day  Substance Use Topics  . Alcohol use: No    Subjective:  Patient presents with concerns for chest congestion/ chest tightness; + low-grade fever; no chest pain or shortness of breath; + headache; + sore throat; symptoms started at the end of last week; she was treated for similar symptoms at end of March- does feel that this cleared completely.   Objective:  Vitals:   04/16/18 1402  BP: 132/80  Pulse: 76  Temp: 98.4 F (36.9 C)  TempSrc: Oral  SpO2: 99%  Height: 5\' 5"  (1.651 m)    General: Well developed, well nourished, in no acute distress  Skin : Warm and dry.  Head: Normocephalic and atraumatic  Ears: External normal; canals clear; tympanic membranes normal  Oropharynx: Pink, supple. No suspicious lesions  Neck: Supple without thyromegaly, adenopathy  Lungs: Respirations unlabored; clear to auscultation  bilaterally without wheeze, rales, rhonchi  CVS exam: normal rate and regular rhythm.  Neurologic: Alert and oriented; speech intact; face symmetrical; moves all extremities well; CNII-XII intact without focal deficit  Assessment:  1. Acute bronchitis, unspecified organism     Plan:  Rx for Z-pak #1 take as directed; Rx for Flonase; patient has Tessalon Perles at home to use as needed; increase fluids, rest and follow-up worse, no better.   No follow-ups on file.  No orders of the defined types were placed in this encounter.   Requested Prescriptions   Signed Prescriptions Disp Refills  . azithromycin (ZITHROMAX) 250 MG tablet 6 tablet 0    Sig: 2 tabs po qd x 1 day; 1 tablet per day x 4 days;  . fluticasone (FLONASE) 50 MCG/ACT nasal spray 16 g 6    Sig: Place 2 sprays into both nostrils daily.

## 2018-05-01 ENCOUNTER — Other Ambulatory Visit: Payer: Self-pay | Admitting: Internal Medicine

## 2018-06-13 ENCOUNTER — Other Ambulatory Visit (INDEPENDENT_AMBULATORY_CARE_PROVIDER_SITE_OTHER): Payer: BLUE CROSS/BLUE SHIELD

## 2018-06-13 ENCOUNTER — Ambulatory Visit (INDEPENDENT_AMBULATORY_CARE_PROVIDER_SITE_OTHER)
Admission: RE | Admit: 2018-06-13 | Discharge: 2018-06-13 | Disposition: A | Discharge status: Home / Self Care | Payer: BLUE CROSS/BLUE SHIELD | Source: Ambulatory Visit | Attending: Internal Medicine | Admitting: Internal Medicine

## 2018-06-13 ENCOUNTER — Encounter: Payer: Self-pay | Admitting: Internal Medicine

## 2018-06-13 ENCOUNTER — Ambulatory Visit (INDEPENDENT_AMBULATORY_CARE_PROVIDER_SITE_OTHER): Payer: BLUE CROSS/BLUE SHIELD | Admitting: Internal Medicine

## 2018-06-13 VITALS — BP 134/86 | HR 76 | Temp 98.8°F | Resp 16

## 2018-06-13 DIAGNOSIS — M1812 Unilateral primary osteoarthritis of first carpometacarpal joint, left hand: Secondary | ICD-10-CM | POA: Diagnosis not present

## 2018-06-13 DIAGNOSIS — L03116 Cellulitis of left lower limb: Secondary | ICD-10-CM | POA: Diagnosis not present

## 2018-06-13 DIAGNOSIS — L0390 Cellulitis, unspecified: Secondary | ICD-10-CM | POA: Insufficient documentation

## 2018-06-13 DIAGNOSIS — M19049 Primary osteoarthritis, unspecified hand: Secondary | ICD-10-CM

## 2018-06-13 DIAGNOSIS — M1811 Unilateral primary osteoarthritis of first carpometacarpal joint, right hand: Secondary | ICD-10-CM | POA: Diagnosis not present

## 2018-06-13 DIAGNOSIS — M65331 Trigger finger, right middle finger: Secondary | ICD-10-CM | POA: Diagnosis not present

## 2018-06-13 LAB — C-REACTIVE PROTEIN: CRP: 0.2 mg/dL — AB (ref 0.5–20.0)

## 2018-06-13 LAB — SEDIMENTATION RATE: SED RATE: 4 mm/h (ref 0–30)

## 2018-06-13 MED ORDER — CEPHALEXIN 500 MG PO CAPS: 500.0000 mg | ORAL_CAPSULE | Freq: Two times a day (BID) | ORAL | 0 refills | Status: DC

## 2018-06-13 NOTE — Assessment & Plan Note (Signed)
Present for a long time, has gotten worse Deferred referral to hand ortho Will treat symptomatically

## 2018-06-13 NOTE — Progress Notes (Signed)
Subjective:    Patient ID: [name], female    DOB: Mar 09, 1946, 72 y.o.   MRN: [id number]  HPI The patient is here for an acute visit.  Hand arthritis/pain:   Over the past several days she has had increased finger pain and swelling in a couple of joints.  She has swelling in the right index mcp and left thumb mcp joint.  Right middle finger locks and she has to mannually pull it up.  This has been going on for a while but has gotten worse.  It is not very painful.  She tries to avoid certain activities with the finger to prevent this.  She assumes it is all arthritis.  She has taken Tyelonl ES  and has tried icy hot.  The Tylenol does help.  She can not take advil or aleve.   She denies other concerning joint pain.    Infection left lateral leg;  She had a blister on her upper left lateral leg and it spontaneously burst.  It is now red and the redness has expanded.  She denies discharge.  She is concerned it is not healing.  She denies fever/chills. She is applying antibacterial ointment  Medications and allergies reviewed with patient and updated if appropriate.  Patient Active Problem List   Diagnosis Date Noted  . Upper respiratory tract infection 03/02/2018  . Sciatica of left side 01/24/2018  . 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  . Intermittent palpitations 10/11/2017  . Intermittent chest pain 10/11/2017  . Migraine 12/30/2015  . Upper airway cough syndrome 10/29/2015  . Osteoporosis 06/06/2011  . Vitamin D deficiency 03/22/2010  . BREAST CANCER, HX OF 03/22/2010  . SPINAL STENOSIS, LUMBAR 03/25/2008  . HYPERLIPIDEMIA 09/28/2007  . Fibromyalgia 05/24/2007    Current Outpatient Medications on File Prior to Visit  Medication Sig Dispense Refill  . fluticasone (FLONASE) 50 MCG/ACT nasal spray Place 2 sprays into both nostrils daily. 16 g 6  . gabapentin (NEURONTIN) 100 MG capsule Take 1-3 capsules (100-300 mg total) by mouth at bedtime. 30 capsule 2  . RELPAX 20 MG  tablet TAKE 1 TABLET BY MOUTH AT ONSET OF HEADACHE. MAY REPEAT 1 TABLET IN 2 HOURS IF HEADACHE PERSISTS OR RECURS 9 tablet 0  . traZODone (DESYREL) 50 MG tablet TAKE 3 TABLETS(150 MG) BY MOUTH AT BEDTIME 270 tablet 0   No current facility-administered medications on file prior to visit.     Past Medical History:  Diagnosis Date  . Arthritis   . Breast cancer ([place])    R lumpectomy ; radiation; no chemotherapy  . Bronchitis    recurrent  . Chronic low back pain    bulging disc & spinal stenosis  . Fibromyalgia   . Hyperlipemia   . Osteoporosis   . Personal history of radiation therapy   . Pneumonia    as OP X 2; no Pneumovax  . Vitamin D deficiency    18 in  01/2009    Past Surgical History:  Procedure Laterality Date  . ABDOMINAL HYSTERECTOMY  1990   [facility]O for fibroid , Dr Maryruth Eve  . BREAST LUMPECTOMY Right   . BREAST SURGERY  2010   R side lumpectomy; Dr Margot Chimes  . CATARACT EXTRACTION, BILATERAL  2017  . CESAREAN SECTION      G 2 P 2  . CYSTECTOMY  2010  . EYE SURGERY     eye lid surgery  . no colonoscopy     "I just don't want  to do it". SOC reviewed    Social History   Socioeconomic History  . Marital status: Married    Spouse name: Not on file  . Number of children: Not on file  . Years of education: Not on file  . Highest education level: Not on file  Occupational History  . Not on file  Social Needs  . Financial resource strain: Not on file  . Food insecurity:    Worry: Not on file    Inability: Not on file  . Transportation needs:    Medical: Not on file    Non-medical: Not on file  Tobacco Use  . Smoking status: Former Smoker    Last attempt to quit: 12/19/1970    Years since quitting: 47.5  . Smokeless tobacco: Never Used  . Tobacco comment: [id number], up to 3 cigarettes / day  Substance and Sexual Activity  . Alcohol use: No  . Drug use: No  . Sexual activity: Not on file  Lifestyle  . Physical activity:    Days per week: Not on file     Minutes per session: Not on file  . Stress: Not on file  Relationships  . Social connections:    Talks on phone: Not on file    Gets together: Not on file    Attends religious service: Not on file    Active member of club or organization: Not on file    Attends meetings of clubs or organizations: Not on file    Relationship status: Not on file  Other Topics Concern  . Not on file  Social History Narrative  . Not on file    Family History  Problem Relation Age of Onset  . Atrial fibrillation Mother        S/P cardioversion  . Ulcers Mother   . Hypertension Mother   . Dementia Mother   . Heart attack Father 52       died @ 68  . Heart attack Paternal Aunt        < 80  . Heart attack Paternal Grandfather        > 55  . Heart attack Paternal Uncle        > 55  . Heart attack Paternal Grandmother        < 64  . Hypertension Brother   . Thyroid disease Daughter        on Synthroid since high school  . Diabetes Brother   . Stroke Neg Hx   . Breast cancer Neg Hx     Review of Systems  Constitutional: Negative for chills and fever.  Musculoskeletal: Positive for arthralgias and joint swelling (two hand joints). Negative for back pain.  Skin: Positive for wound (left lateral leg - no discharge). Negative for color change.       Objective:   Vitals:   06/13/18 1444  BP: 134/86  Pulse: 76  Resp: 16  Temp: 98.8 F (37.1 C)  SpO2: 98%   BP Readings from Last 3 Encounters:  06/13/18 134/86  04/16/18 132/80  03/02/18 124/84   Wt Readings from Last 3 Encounters:  01/24/18 127 lb (57.6 kg)  10/21/17 126 lb 1.3 oz (57.2 kg)  10/11/17 127 lb (57.6 kg)   There is no height or weight on file to calculate BMI.   Physical Exam  Constitutional: She appears well-developed and well-nourished. No distress.  Musculoskeletal: She exhibits tenderness (right index mcp, left first mcp).  Swelling/synovitis of two affected joints,  no other hand joint swelling.  No tenderness  of right middle finger - no tendon contractures  Neurological: No sensory deficit. She exhibits normal muscle tone.  Skin: Skin is warm and dry. She is not diaphoretic. No erythema.  Oblong Ping pong ball sized area of erythema with central superficial ulcer from burst blister - no discharge, no fluctuance or induration           Assessment & Plan:    See Problem List for Assessment and Plan of chronic medical problems.

## 2018-06-13 NOTE — Assessment & Plan Note (Addendum)
Synovitis of left first MCP, right index MCP Tylenol ES as needed, topical arthritis medications Try tumeric and tart cherry juice Xray of both hands and autoimmune blood work to r/o autoimmune arthritis

## 2018-06-13 NOTE — Assessment & Plan Note (Signed)
Small area on left lateral leg from burst blister Not improving with topical antibacterial  - if no improvement in next day or two start antibiotic - keflex sent to pharmacy Call if no improvement

## 2018-06-13 NOTE — Patient Instructions (Signed)
  Test(s) ordered today. Your results will be released to [place] (or called to you) after review, usually within 72hours after test completion. If any changes need to be made, you will be notified at that same time.   An antibiotic was sent to your pharmacy - use it if needed for your leg infection.   Try tylenol and topical arthritis medications for your hands.  Start taking tumeric and tart cherry juice.

## 2018-06-15 LAB — ANTI-NUCLEAR AB-TITER (ANA TITER): ANA Titer 1: 1:320 {titer} — ABNORMAL HIGH

## 2018-06-15 LAB — ANA: Anti Nuclear Antibody(ANA): POSITIVE — AB

## 2018-06-16 ENCOUNTER — Encounter: Payer: Self-pay | Admitting: Internal Medicine

## 2018-06-16 DIAGNOSIS — R768 Other specified abnormal immunological findings in serum: Secondary | ICD-10-CM

## 2018-06-19 ENCOUNTER — Encounter: Payer: Self-pay | Admitting: Internal Medicine

## 2018-07-05 ENCOUNTER — Encounter: Payer: Self-pay | Admitting: Internal Medicine

## 2018-07-29 ENCOUNTER — Other Ambulatory Visit: Payer: Self-pay | Admitting: Internal Medicine

## 2018-08-11 ENCOUNTER — Other Ambulatory Visit: Payer: Self-pay | Admitting: Internal Medicine

## 2018-08-13 MED ORDER — ELETRIPTAN HYDROBROMIDE 20 MG PO TABS: 20.0000 mg | ORAL_TABLET | ORAL | 0 refills | Status: DC | PRN

## 2018-08-13 NOTE — Telephone Encounter (Signed)
Reviewed chart pt is up-to-date sent refills to [facility]  

## 2018-08-26 ENCOUNTER — Encounter: Payer: Self-pay | Admitting: Internal Medicine

## 2018-08-27 ENCOUNTER — Ambulatory Visit (INDEPENDENT_AMBULATORY_CARE_PROVIDER_SITE_OTHER)
Admission: RE | Admit: 2018-08-27 | Discharge: 2018-08-27 | Disposition: A | Discharge status: Home / Self Care | Payer: BLUE CROSS/BLUE SHIELD | Source: Ambulatory Visit | Attending: Internal Medicine | Admitting: Internal Medicine

## 2018-08-27 ENCOUNTER — Encounter: Payer: Self-pay | Admitting: Internal Medicine

## 2018-08-27 ENCOUNTER — Ambulatory Visit (INDEPENDENT_AMBULATORY_CARE_PROVIDER_SITE_OTHER): Payer: BLUE CROSS/BLUE SHIELD | Admitting: Internal Medicine

## 2018-08-27 VITALS — BP 124/86 | HR 77 | Temp 98.7°F | Resp 16 | Ht 65.0 in | Wt 128.0 lb

## 2018-08-27 DIAGNOSIS — M25562 Pain in left knee: Secondary | ICD-10-CM | POA: Diagnosis not present

## 2018-08-27 DIAGNOSIS — M25462 Effusion, left knee: Secondary | ICD-10-CM | POA: Diagnosis not present

## 2018-08-27 NOTE — Assessment & Plan Note (Addendum)
Acute pain and swelling of left knee related to fall 10 days ago where she had direct impact on the knee She was concerned that she was still experiencing pain and swelling, which is likely to be expected Tenderness on patella-we will get an x-ray to make sure there is no fracture Will refer to sports medicine-may need effusion drained Unable to take NSAIDs-stressed icing the knee Continue Tylenol Can apply topical arthritic medications

## 2018-08-27 NOTE — Patient Instructions (Addendum)
Have an xray today.    Ice the knee.  Continue tylenol.  Apply topical arthritis medications as needed   Schedule an appointment with Dr Tamala Julian.

## 2018-08-27 NOTE — Progress Notes (Signed)
Subjective:    Patient ID: [name], female    DOB: 03/10/1946, 72 y.o.   MRN: [id number]  HPI The patient is here for an acute visit.   She fell 10 days ago and hit her knee, which is still swollen.    She was getting out of the shower and has ceramic floors and she slipped.  Left knee crashed into the floor.  Left foot swollen, but does not hurt.  Her left hip hurts, but she thinks that is related to the knee pain.Marland Kitchen   Her left knee is painful and swollen. It has gotten better, but is still very painful and swollen after 10 days.  She is taking Tylenol ES, which has hleped.   Going up stair is very difficulty. She has pain with walking.  Once she gets to sleep she has no pain.  She can not take Advil - it will make her sick.  She gets trouble with her stomach.  Has not iced the knee.    Has good range of knee but it is painful.   Medications and allergies reviewed with patient and updated if appropriate.  Patient Active Problem List   Diagnosis Date Noted  . 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 Cellulitis 06/13/2018  . Trigger middle finger of right hand 06/13/2018  . Upper respiratory tract infection 03/02/2018  . Sciatica of left side 01/24/2018  . 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  . Intermittent palpitations 10/11/2017  . Intermittent chest pain 10/11/2017  . Migraine 12/30/2015  . Upper airway cough syndrome 10/29/2015  . Osteoporosis 06/06/2011  . Vitamin D deficiency 03/22/2010  . BREAST CANCER, HX OF 03/22/2010  . SPINAL STENOSIS, LUMBAR 03/25/2008  . HYPERLIPIDEMIA 09/28/2007  . Fibromyalgia 05/24/2007    Current Outpatient Medications on File Prior to Visit  Medication Sig Dispense Refill  . eletriptan (RELPAX) 20 MG tablet Take 1 tablet (20 mg total) by mouth every 2 (two) hours as needed for migraine or headache. May repeat in 2 hours if headache persists or recurs. 9 tablet 0  . fluticasone (FLONASE) 50 MCG/ACT nasal spray Place 2 sprays into both  nostrils daily. 16 g 6  . gabapentin (NEURONTIN) 100 MG capsule Take 1-3 capsules (100-300 mg total) by mouth at bedtime. 30 capsule 2  . traZODone (DESYREL) 50 MG tablet Take 3 tablets (150 mg total) by mouth at bedtime. -- Office visit needed for further refills 270 tablet 0   No current facility-administered medications on file prior to visit.     Past Medical History:  Diagnosis Date  . Arthritis   . Breast cancer ([place])    R lumpectomy ; radiation; no chemotherapy  . Bronchitis    recurrent  . Chronic low back pain    bulging disc & spinal stenosis  . Fibromyalgia   . Hyperlipemia   . Osteoporosis   . Personal history of radiation therapy   . Pneumonia    as OP X 2; no Pneumovax  . Vitamin D deficiency    18 in  01/2009    Past Surgical History:  Procedure Laterality Date  . ABDOMINAL HYSTERECTOMY  1990   [facility]O for fibroid , Dr Maryruth Eve  . BREAST LUMPECTOMY Right   . BREAST SURGERY  2010   R side lumpectomy; Dr Margot Chimes  . CATARACT EXTRACTION, BILATERAL  2017  . CESAREAN SECTION      G 2 P 2  . CYSTECTOMY  2010  . EYE SURGERY  eye lid surgery  . no colonoscopy     "I just don't want to do it". SOC reviewed    Social History   Socioeconomic History  . Marital status: Married    Spouse name: Not on file  . Number of children: Not on file  . Years of education: Not on file  . Highest education level: Not on file  Occupational History  . Not on file  Social Needs  . Financial resource strain: Not on file  . Food insecurity:    Worry: Not on file    Inability: Not on file  . Transportation needs:    Medical: Not on file    Non-medical: Not on file  Tobacco Use  . Smoking status: Former Smoker    Last attempt to quit: 12/19/1970    Years since quitting: 47.7  . Smokeless tobacco: Never Used  . Tobacco comment: [id number], up to 3 cigarettes / day  Substance and Sexual Activity  . Alcohol use: No  . Drug use: No  . Sexual activity: Not on file  Lifestyle   . Physical activity:    Days per week: Not on file    Minutes per session: Not on file  . Stress: Not on file  Relationships  . Social connections:    Talks on phone: Not on file    Gets together: Not on file    Attends religious service: Not on file    Active member of club or organization: Not on file    Attends meetings of clubs or organizations: Not on file    Relationship status: Not on file  Other Topics Concern  . Not on file  Social History Narrative  . Not on file    Family History  Problem Relation Age of Onset  . Atrial fibrillation Mother        S/P cardioversion  . Ulcers Mother   . Hypertension Mother   . Dementia Mother   . Heart attack Father 32       died @ 29  . Heart attack Paternal Aunt        < 16  . Heart attack Paternal Grandfather        > 55  . Heart attack Paternal Uncle        > 55  . Heart attack Paternal Grandmother        < 44  . Hypertension Brother   . Thyroid disease Daughter        on Synthroid since high school  . Diabetes Brother   . Stroke Neg Hx   . Breast cancer Neg Hx     Review of Systems  Constitutional: Negative for chills and fever.  Musculoskeletal: Positive for arthralgias and joint swelling.  Skin: Negative for color change and wound.  Neurological: Negative for numbness.       Objective:   Vitals:   08/27/18 1519  BP: 124/86  Pulse: 77  Resp: 16  Temp: 98.7 F (37.1 C)  SpO2: 98%   BP Readings from Last 3 Encounters:  08/27/18 124/86  06/13/18 134/86  04/16/18 132/80   Wt Readings from Last 3 Encounters:  08/27/18 128 lb (58.1 kg)  01/24/18 127 lb (57.6 kg)  10/21/17 126 lb 1.3 oz (57.2 kg)   Body mass index is 21.3 kg/m.   Physical Exam    A left knee exam was performed.   SKIN: intact, no bruising   SWELLING: no  EFFUSION: yes  WARMTH: no  warmth  TENDERNESS: moderate tenderness on patella, no pain elsewhere.  No pain with other maneuvers of the knee ROM: full extension, full flexion  but with pain GAIT: walks with a limp  NEUROLOGICAL EXAM: normal sensation  CALF TENDERNESS: no      Assessment & Plan:    See Problem List for Assessment and Plan of chronic medical problems.

## 2018-08-28 ENCOUNTER — Encounter: Payer: Self-pay | Admitting: Internal Medicine

## 2018-09-13 ENCOUNTER — Ambulatory Visit: Payer: BLUE CROSS/BLUE SHIELD | Admitting: Family Medicine

## 2018-09-24 ENCOUNTER — Ambulatory Visit: Payer: BLUE CROSS/BLUE SHIELD | Admitting: Family Medicine

## 2018-10-03 ENCOUNTER — Ambulatory Visit (INDEPENDENT_AMBULATORY_CARE_PROVIDER_SITE_OTHER): Payer: BLUE CROSS/BLUE SHIELD | Admitting: Internal Medicine

## 2018-10-03 ENCOUNTER — Encounter: Payer: Self-pay | Admitting: Internal Medicine

## 2018-10-03 VITALS — BP 148/92 | HR 72 | Temp 99.7°F | Resp 16 | Ht 65.0 in | Wt 129.8 lb

## 2018-10-03 DIAGNOSIS — J069 Acute upper respiratory infection, unspecified: Secondary | ICD-10-CM

## 2018-10-03 MED ORDER — AZITHROMYCIN 250 MG PO TABS: ORAL_TABLET | ORAL | 0 refills | Status: DC

## 2018-10-03 MED ORDER — BENZONATATE 200 MG PO CAPS: 200.0000 mg | ORAL_CAPSULE | Freq: Three times a day (TID) | ORAL | 0 refills | Status: DC | PRN

## 2018-10-03 NOTE — Patient Instructions (Signed)

## 2018-10-03 NOTE — Assessment & Plan Note (Signed)
Z-Pak, Tessalon Perls Take over-the-counter cold medications Rest, fluids Call if no improvement

## 2018-10-03 NOTE — Progress Notes (Signed)
Subjective:    Patient ID: [name], female    DOB: 05-Apr-1946, 72 y.o.   MRN: [id number]  HPI She is here for an acute visit for cold symptoms.  Her symptoms started two nights ago  She is experiencing fevers, chills, nasal congestion, sore throat, productive cough, intermittent wheezing, body aches and headaches.  She denies any ear pain, sinus pain, shortness of breath, nausea and diarrhea.  She has taken tylenol sinus.    Medications and allergies reviewed with patient and updated if appropriate.  Patient Active Problem List   Diagnosis Date Noted  . Acute pain of left knee 08/27/2018  . 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 Cellulitis 06/13/2018  . Trigger middle finger of right hand 06/13/2018  . Upper respiratory tract infection 03/02/2018  . Sciatica of left side 01/24/2018  . 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  . Intermittent palpitations 10/11/2017  . Intermittent chest pain 10/11/2017  . Migraine 12/30/2015  . Upper airway cough syndrome 10/29/2015  . Osteoporosis 06/06/2011  . Vitamin D deficiency 03/22/2010  . BREAST CANCER, HX OF 03/22/2010  . SPINAL STENOSIS, LUMBAR 03/25/2008  . HYPERLIPIDEMIA 09/28/2007  . Fibromyalgia 05/24/2007    Current Outpatient Medications on File Prior to Visit  Medication Sig Dispense Refill  . eletriptan (RELPAX) 20 MG tablet Take 1 tablet (20 mg total) by mouth every 2 (two) hours as needed for migraine or headache. May repeat in 2 hours if headache persists or recurs. 9 tablet 0  . fluticasone (FLONASE) 50 MCG/ACT nasal spray Place 2 sprays into both nostrils daily. 16 g 6  . gabapentin (NEURONTIN) 100 MG capsule Take 1-3 capsules (100-300 mg total) by mouth at bedtime. 30 capsule 2  . traZODone (DESYREL) 50 MG tablet Take 3 tablets (150 mg total) by mouth at bedtime. -- Office visit needed for further refills 270 tablet 0   No current facility-administered medications on file prior to visit.     Past Medical History:    Diagnosis Date  . Arthritis   . Breast cancer ([place])    R lumpectomy ; radiation; no chemotherapy  . Bronchitis    recurrent  . Chronic low back pain    bulging disc & spinal stenosis  . Fibromyalgia   . Hyperlipemia   . Osteoporosis   . Personal history of radiation therapy   . Pneumonia    as OP X 2; no Pneumovax  . Vitamin D deficiency    18 in  01/2009    Past Surgical History:  Procedure Laterality Date  . ABDOMINAL HYSTERECTOMY  1990   USO for fibroid , Dr Maryruth Eve  . BREAST LUMPECTOMY Right   . BREAST SURGERY  2010   R side lumpectomy; Dr Margot Chimes  . CATARACT EXTRACTION, BILATERAL  2017  . CESAREAN SECTION      G 2 P 2  . CYSTECTOMY  2010  . EYE SURGERY     eye lid surgery  . no colonoscopy     "I just don't want to do it". SOC reviewed    Social History   Socioeconomic History  . Marital status: Married    Spouse name: Not on file  . Number of children: Not on file  . Years of education: Not on file  . Highest education level: Not on file  Occupational History  . Not on file  Social Needs  . Financial resource strain: Not on file  . Food insecurity:    Worry: Not on file  Inability: Not on file  . Transportation needs:    Medical: Not on file    Non-medical: Not on file  Tobacco Use  . Smoking status: Former Smoker    Last attempt to quit: 12/19/1970    Years since quitting: 47.8  . Smokeless tobacco: Never Used  . Tobacco comment: [id number], up to 3 cigarettes / day  Substance and Sexual Activity  . Alcohol use: No  . Drug use: No  . Sexual activity: Not on file  Lifestyle  . Physical activity:    Days per week: Not on file    Minutes per session: Not on file  . Stress: Not on file  Relationships  . Social connections:    Talks on phone: Not on file    Gets together: Not on file    Attends religious service: Not on file    Active member of club or organization: Not on file    Attends meetings of clubs or organizations: Not on file     Relationship status: Not on file  Other Topics Concern  . Not on file  Social History Narrative  . Not on file    Family History  Problem Relation Age of Onset  . Atrial fibrillation Mother        S/P cardioversion  . Ulcers Mother   . Hypertension Mother   . Dementia Mother   . Heart attack Father 31       died @ 73  . Heart attack Paternal Aunt        < 14  . Heart attack Paternal Grandfather        > 55  . Heart attack Paternal Uncle        > 55  . Heart attack Paternal Grandmother        < 89  . Hypertension Brother   . Thyroid disease Daughter        on Synthroid since high school  . Diabetes Brother   . Stroke Neg Hx   . Breast cancer Neg Hx     Review of Systems  Constitutional: Positive for chills and fever.  HENT: Positive for congestion and sore throat. Negative for ear pain, sinus pressure and sinus pain.   Respiratory: Positive for cough (productive) and wheezing (today). Negative for chest tightness and shortness of breath.   Gastrointestinal: Negative for diarrhea and nausea.  Musculoskeletal: Positive for myalgias.  Neurological: Positive for headaches.       Objective:   Vitals:   10/03/18 1433  BP: (!) 148/92  Pulse: 72  Resp: 16  Temp: 99.7 F (37.6 C)  SpO2: 92%   Filed Weights   10/03/18 1433  Weight: 129 lb 12.8 oz (58.9 kg)   Body mass index is 21.6 kg/m.  Wt Readings from Last 3 Encounters:  10/03/18 129 lb 12.8 oz (58.9 kg)  08/27/18 128 lb (58.1 kg)  01/24/18 127 lb (57.6 kg)     Physical Exam GENERAL APPEARANCE: Mildly ill appearing, NAD EYES: conjunctiva clear, no icterus HEENT: bilateral tympanic membranes and ear canals normal, oropharynx with mild erythema, no thyromegaly, trachea midline, no cervical or supraclavicular lymphadenopathy LUNGS: Clear to auscultation without wheeze or crackles, unlabored breathing, good air entry bilaterally CARDIOVASCULAR: Normal S1,S2 without murmurs, no edema SKIN: warm,  dry        Assessment & Plan:   See Problem List for Assessment and Plan of chronic medical problems.

## 2018-10-25 ENCOUNTER — Other Ambulatory Visit: Payer: Self-pay | Admitting: Internal Medicine

## 2018-10-27 IMAGING — DX DG CHEST 2V
2 series · 2 of 2 positions shown · non-contrast
Comparison: 09/01/2015 CXR

CLINICAL DATA: Chronic cough

EXAM:
CHEST  2 VIEW

[chest pa]
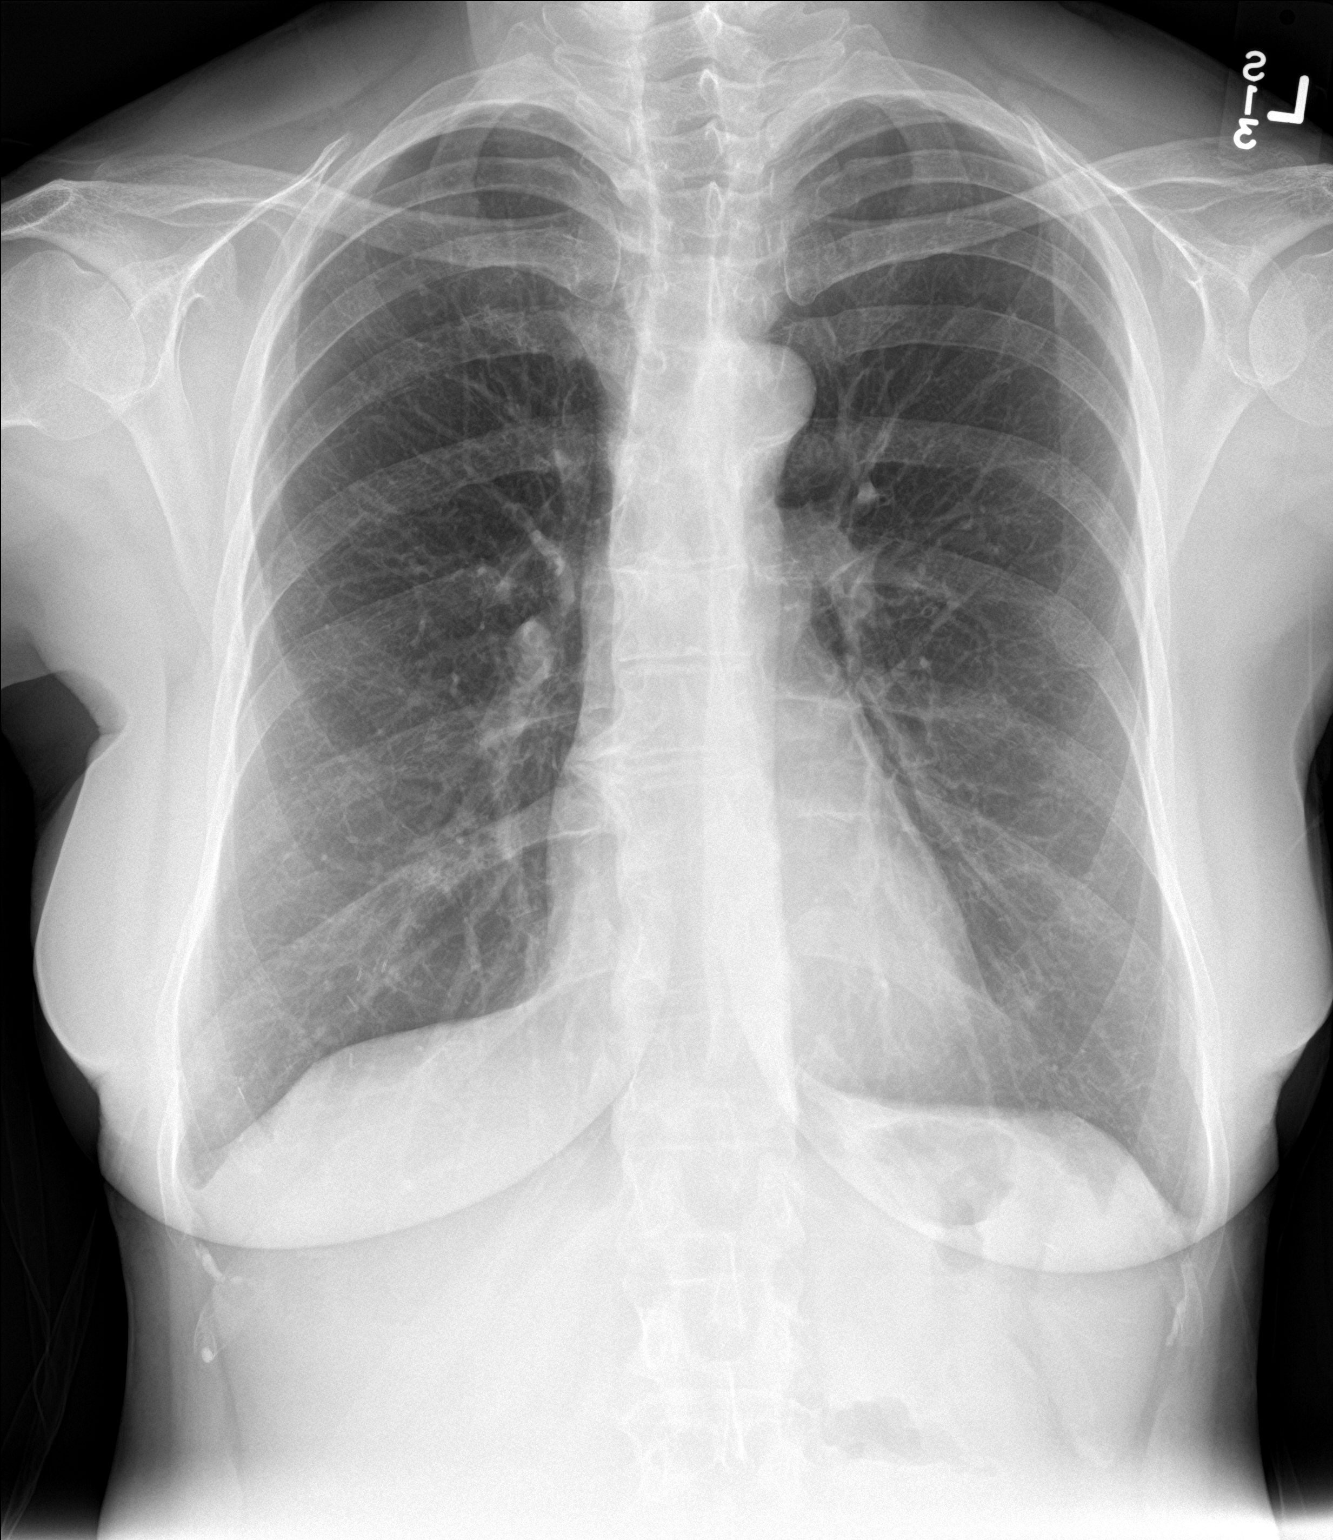

[chest lat]
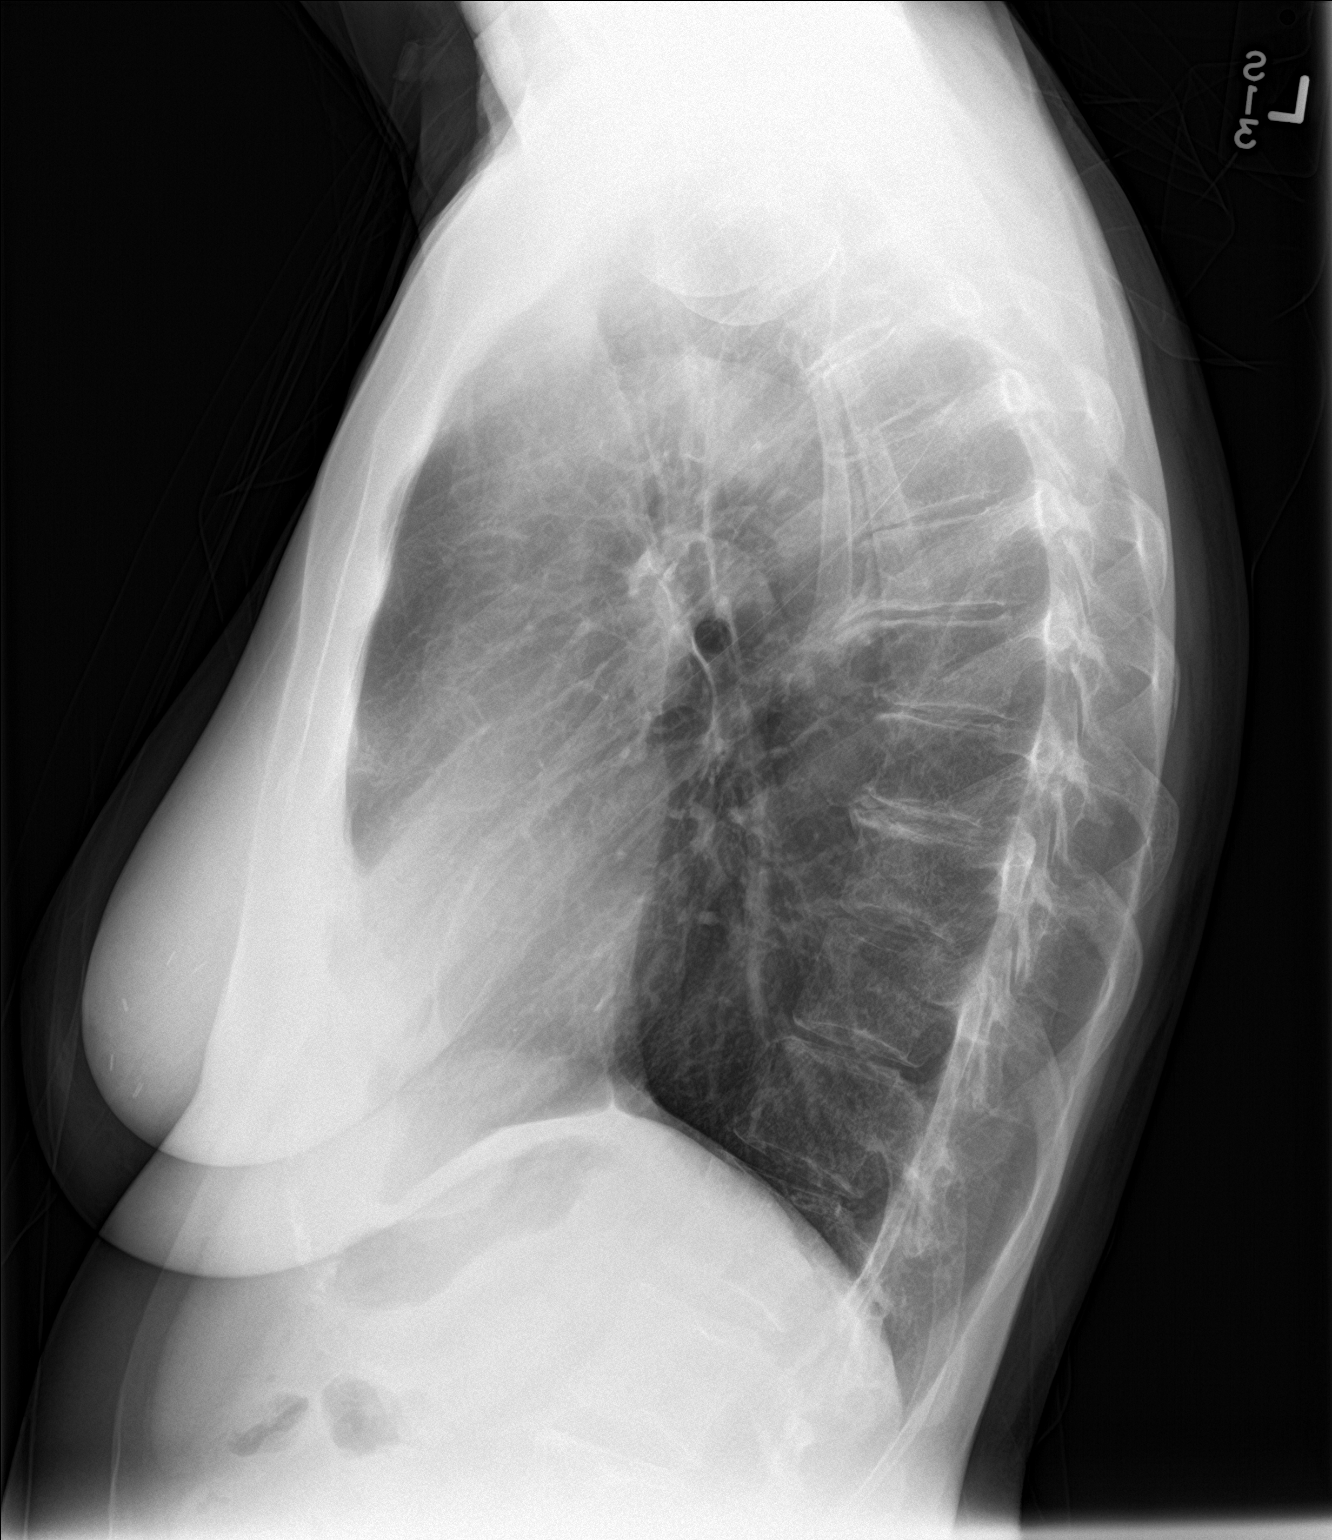

[2 of 2 positions shown; findings below may reference images not displayed]

FINDINGS: The heart size and mediastinal contours are within normal limits.
Both lungs are clear. The visualized skeletal structures are
unremarkable.
IMPRESSION: No active cardiopulmonary disease.

## 2018-11-23 ENCOUNTER — Telehealth: Payer: Self-pay

## 2018-11-23 MED ORDER — TRAZODONE HCL 50 MG PO TABS: ORAL_TABLET | ORAL | 0 refills | Status: DC

## 2018-11-23 NOTE — Telephone Encounter (Signed)
Error

## 2018-12-28 ENCOUNTER — Other Ambulatory Visit: Payer: Self-pay | Admitting: Internal Medicine

## 2019-01-01 ENCOUNTER — Telehealth: Payer: Self-pay

## 2019-01-01 DIAGNOSIS — Z0000 Encounter for general adult medical examination without abnormal findings: Secondary | ICD-10-CM

## 2019-01-01 DIAGNOSIS — M816 Localized osteoporosis [Lequesne]: Secondary | ICD-10-CM

## 2019-01-01 DIAGNOSIS — E782 Mixed hyperlipidemia: Secondary | ICD-10-CM

## 2019-01-01 DIAGNOSIS — E559 Vitamin D deficiency, unspecified: Secondary | ICD-10-CM

## 2019-01-01 MED ORDER — TRAZODONE HCL 50 MG PO TABS: ORAL_TABLET | ORAL | 0 refills | Status: DC

## 2019-01-01 NOTE — Telephone Encounter (Signed)
Copied from [place] [id number]. Topic: General - Other >> Jan 01, 2019  3:12 PM Carolyn Stare wrote:  Pt said she wanted to know why Dr Quay Burow wanted her to come in for a OV to have her  traZODone (DESYREL) 50 MG tablet refill when she has never had to do this before.She said she wanted a phone call back as to why

## 2019-01-01 NOTE — Telephone Encounter (Signed)
Spoke with pt. Got her set up for a physical. Would like blood work put in before hand.

## 2019-01-02 NOTE — Addendum Note (Signed)
Addended by: Binnie Rail on: 01/02/2019 10:11 AM   Modules accepted: Orders

## 2019-01-02 NOTE — Telephone Encounter (Signed)
ordered

## 2019-01-07 ENCOUNTER — Other Ambulatory Visit (INDEPENDENT_AMBULATORY_CARE_PROVIDER_SITE_OTHER): Payer: BLUE CROSS/BLUE SHIELD

## 2019-01-07 DIAGNOSIS — M816 Localized osteoporosis [Lequesne]: Secondary | ICD-10-CM

## 2019-01-07 DIAGNOSIS — Z0000 Encounter for general adult medical examination without abnormal findings: Secondary | ICD-10-CM

## 2019-01-07 DIAGNOSIS — E559 Vitamin D deficiency, unspecified: Secondary | ICD-10-CM | POA: Diagnosis not present

## 2019-01-07 DIAGNOSIS — E782 Mixed hyperlipidemia: Secondary | ICD-10-CM | POA: Diagnosis not present

## 2019-01-07 LAB — COMPREHENSIVE METABOLIC PANEL
ALT: 13 U/L (ref 0–35)
AST: 14 U/L (ref 0–37)
Albumin: 4.3 g/dL (ref 3.5–5.2)
Alkaline Phosphatase: 57 U/L (ref 39–117)
BUN: 17 mg/dL (ref 6–23)
CO2: 25 mEq/L (ref 19–32)
Calcium: 9.3 mg/dL (ref 8.4–10.5)
Chloride: 106 mEq/L (ref 96–112)
Creatinine, Ser: 1.05 mg/dL (ref 0.40–1.20)
GFR: 51.53 mL/min — ABNORMAL LOW (ref >60.00)
Glucose, Bld: 95 mg/dL (ref 70–99)
POTASSIUM: 4.4 meq/L (ref 3.5–5.1)
SODIUM: 140 meq/L (ref 135–145)
Total Bilirubin: 0.5 mg/dL (ref 0.2–1.2)
Total Protein: 6.8 g/dL (ref 6.0–8.3)

## 2019-01-07 LAB — CBC WITH DIFFERENTIAL/PLATELET
BASOS ABS: 0 10*3/uL (ref 0.0–0.1)
BASOS PCT: 1.1 % (ref 0.0–3.0)
EOS ABS: 0.1 10*3/uL (ref 0.0–0.7)
Eosinophils Relative: 2.2 % (ref 0.0–5.0)
HCT: 37.7 % (ref 36.0–46.0)
Hemoglobin: 12.8 g/dL (ref 12.0–15.0)
Lymphocytes Relative: 23.3 % (ref 12.0–46.0)
Lymphs Abs: 0.9 10*3/uL (ref 0.7–4.0)
MCHC: 34 g/dL (ref 30.0–36.0)
MCV: 90.8 fl (ref 78.0–100.0)
Monocytes Absolute: 0.3 10*3/uL (ref 0.1–1.0)
Monocytes Relative: 8.4 % (ref 3.0–12.0)
NEUTROS PCT: 65 % (ref 43.0–77.0)
Neutro Abs: 2.6 10*3/uL (ref 1.4–7.7)
Platelets: 249 10*3/uL (ref 150.0–400.0)
RBC: 4.15 Mil/uL (ref 3.87–5.11)
RDW: 13.6 % (ref 11.5–15.5)
WBC: 3.9 10*3/uL — ABNORMAL LOW (ref 4.0–10.5)

## 2019-01-07 LAB — TSH: TSH: 1.96 u[IU]/mL (ref 0.35–4.50)

## 2019-01-07 LAB — LIPID PANEL
CHOLESTEROL: 236 mg/dL — AB (ref 0–200)
HDL: 63.8 mg/dL (ref >39.00)
LDL Cholesterol: 155 mg/dL — ABNORMAL HIGH (ref 0–99)
NonHDL: 172.37
Total CHOL/HDL Ratio: 4
Triglycerides: 87 mg/dL (ref 0.0–149.0)
VLDL: 17.4 mg/dL (ref 0.0–40.0)

## 2019-01-07 LAB — VITAMIN D 25 HYDROXY (VIT D DEFICIENCY, FRACTURES): VITD: 27.81 ng/mL — ABNORMAL LOW (ref 30.00–100.00)

## 2019-01-11 ENCOUNTER — Ambulatory Visit: Payer: BLUE CROSS/BLUE SHIELD | Admitting: Internal Medicine

## 2019-01-13 NOTE — Progress Notes (Signed)
Subjective:    Patient ID: [name], female    DOB: 11/27/1946, 73 y.o.   MRN: [id number]  HPI She is here for a physical exam.   She fell over christmas.  She was carrying a lot of things and was going up two steps and fell. She injured her leg. It has gotten better but she would like me to look at it.  She denies other injuries and did not hit her head.  She has no other concerns.    Medications and allergies reviewed with patient and updated if appropriate.  Patient Active Problem List   Diagnosis Date Noted  . Acute pain of left knee 08/27/2018  . 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 Trigger middle finger of right hand 06/13/2018  . Sciatica of left side 01/24/2018  . 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  . Intermittent palpitations 10/11/2017  . Intermittent chest pain 10/11/2017  . Migraine 12/30/2015  . Upper airway cough syndrome 10/29/2015  . Osteoporosis 06/06/2011  . Vitamin D deficiency 03/22/2010  . BREAST CANCER, HX OF 03/22/2010  . SPINAL STENOSIS, LUMBAR 03/25/2008  . HYPERLIPIDEMIA 09/28/2007  . Fibromyalgia 05/24/2007    Current Outpatient Medications on File Prior to Visit  Medication Sig Dispense Refill  . eletriptan (RELPAX) 20 MG tablet Take 1 tablet (20 mg total) by mouth every 2 (two) hours as needed for migraine or headache. May repeat in 2 hours if headache persists or recurs. 9 tablet 0  . fluticasone (FLONASE) 50 MCG/ACT nasal spray Place 2 sprays into both nostrils daily. 16 g 6  . gabapentin (NEURONTIN) 100 MG capsule Take 1-3 capsules (100-300 mg total) by mouth at bedtime. 30 capsule 2  . traZODone (DESYREL) 50 MG tablet TAKE 3 TABLETS BY MOUTH AT BEDTIME. OFFICE VISIT FOR FURTHER REFILLS 90 tablet 0   No current facility-administered medications on file prior to visit.     Past Medical History:  Diagnosis Date  . Arthritis   . Breast cancer ([place])    R lumpectomy ; radiation; no chemotherapy  . Bronchitis    recurrent  . Chronic low  back pain    bulging disc & spinal stenosis  . Fibromyalgia   . Hyperlipemia   . Osteoporosis   . Personal history of radiation therapy   . Pneumonia    as OP X 2; no Pneumovax  . Vitamin D deficiency    18 in  01/2009    Past Surgical History:  Procedure Laterality Date  . ABDOMINAL HYSTERECTOMY  1990   USO for fibroid , Dr Maryruth Eve  . BREAST LUMPECTOMY Right   . BREAST SURGERY  2010   R side lumpectomy; Dr Margot Chimes  . CATARACT EXTRACTION, BILATERAL  2017  . CESAREAN SECTION      G 2 P 2  . CYSTECTOMY  2010  . EYE SURGERY     eye lid surgery  . no colonoscopy     "I just don't want to do it". SOC reviewed    Social History   Socioeconomic History  . Marital status: Married    Spouse name: Not on file  . Number of children: Not on file  . Years of education: Not on file  . Highest education level: Not on file  Occupational History  . Not on file  Social Needs  . Financial resource strain: Not on file  . Food insecurity:    Worry: Not on file    Inability: Not on file  . Transportation  needs:    Medical: Not on file    Non-medical: Not on file  Tobacco Use  . Smoking status: Former Smoker    Last attempt to quit: 12/19/1970    Years since quitting: 48.1  . Smokeless tobacco: Never Used  . Tobacco comment: [id number], up to 3 cigarettes / day  Substance and Sexual Activity  . Alcohol use: No  . Drug use: No  . Sexual activity: Not on file  Lifestyle  . Physical activity:    Days per week: Not on file    Minutes per session: Not on file  . Stress: Not on file  Relationships  . Social connections:    Talks on phone: Not on file    Gets together: Not on file    Attends religious service: Not on file    Active member of club or organization: Not on file    Attends meetings of clubs or organizations: Not on file    Relationship status: Not on file  Other Topics Concern  . Not on file  Social History Narrative  . Not on file    Family History  Problem  Relation Age of Onset  . Atrial fibrillation Mother        S/P cardioversion  . Ulcers Mother   . Hypertension Mother   . Dementia Mother   . Heart attack Father 72       died @ 56  . Heart attack Paternal Aunt        < 22  . Heart attack Paternal Grandfather        > 55  . Heart attack Paternal Uncle        > 55  . Heart attack Paternal Grandmother        < 38  . Hypertension Brother   . Thyroid disease Daughter        on Synthroid since high school  . Diabetes Brother   . Stroke Neg Hx   . Breast cancer Neg Hx     Review of Systems  Constitutional: Negative for chills and fever.  Eyes: Negative for visual disturbance.  Respiratory: Negative for cough, shortness of breath and wheezing.   Cardiovascular: Positive for palpitations (occ, no different). Negative for chest pain and leg swelling.  Gastrointestinal: Negative for abdominal pain, blood in stool, constipation, diarrhea and nausea.       No gerd  Genitourinary: Negative for dysuria and hematuria.  Musculoskeletal: Positive for arthralgias, back pain and myalgias.  Skin: Negative for color change and rash.  Neurological: Negative for dizziness, light-headedness and headaches.  Psychiatric/Behavioral: Negative for dysphoric mood. The patient is not nervous/anxious.        Objective:   Vitals:   01/14/19 1410  BP: (!) 148/92  Pulse: 65  Resp: 16  Temp: 98 F (36.7 C)  SpO2: 98%   Filed Weights   01/14/19 1410  Weight: 127 lb (57.6 kg)   Body mass index is 21.13 kg/m.  BP Readings from Last 3 Encounters:  01/14/19 (!) 148/92  10/03/18 (!) 148/92  08/27/18 124/86    Wt Readings from Last 3 Encounters:  01/14/19 127 lb (57.6 kg)  10/03/18 129 lb 12.8 oz (58.9 kg)  08/27/18 128 lb (58.1 kg)     Physical Exam Constitutional: She appears well-developed and well-nourished. No distress.  HENT:  Head: Normocephalic and atraumatic.  Right Ear: External ear normal. Normal ear canal and TM Left Ear:  External ear normal.  Normal ear canal  and TM Mouth/Throat: Oropharynx is clear and moist.  Eyes: Conjunctivae and EOM are normal.  Neck: Neck supple. No tracheal deviation present. No thyromegaly present.  No carotid bruit  Cardiovascular: Normal rate, regular rhythm and normal heart sounds.   No murmur heard.  No edema. Pulmonary/Chest: Effort normal and breath sounds normal. No respiratory distress. She has no wheezes. She has no rales.  Breast: deferred   Abdominal: Soft. She exhibits no distension. There is no tenderness.  Lymphadenopathy: She has no cervical adenopathy.  Skin: Skin is warm and dry. She is not diaphoretic.  Psychiatric: She has a normal mood and affect. Her behavior is normal.        Assessment & Plan:   Physical exam: Screening blood work  reviewed Immunizations - deferred all Colonoscopy    Never had one - deferred Mammogram   Up to date  Dexa last dexa 2017 - due - will order Eye exams  Up to date  Exercise  Walks most days, takes fitness class twice a week, trainer once a week Weight  Normal BMI Skin   No concerns Substance abuse   none  See Problem List for Assessment and Plan of chronic medical problems.  FU annually   The 10-year ASCVD risk score Mikey Bussing DC Brooke Bonito., et al., 2013) is: 14%   Values used to calculate the score:     Age: 48 years     Sex: Female     Is Non-Hispanic African American: No     Diabetic: No     Tobacco smoker: No     Systolic Blood Pressure: 161 mmHg     Is BP treated: No     HDL Cholesterol: 63.8 mg/dL     Total Cholesterol: 236 mg/dL

## 2019-01-13 NOTE — Patient Instructions (Addendum)
We reviewed your blood work.   All other Health Maintenance issues reviewed.   All recommended immunizations and age-appropriate screenings are up-to-date or discussed.  No immunizations administered today.   Medications reviewed and updated.  Changes include :   none   Please followup in one year   Health Maintenance, Female Adopting a healthy lifestyle and getting preventive care can go a long way to promote health and wellness. Talk with your health care provider about what schedule of regular examinations is right for you. This is a good chance for you to check in with your provider about disease prevention and staying healthy. In between checkups, there are plenty of things you can do on your own. Experts have done a lot of research about which lifestyle changes and preventive measures are most likely to keep you healthy. Ask your health care provider for more information. Weight and diet Eat a healthy diet  Be sure to include plenty of vegetables, fruits, low-fat dairy products, and lean protein.  Do not eat a lot of foods high in solid fats, added sugars, or salt.  Get regular exercise. This is one of the most important things you can do for your health. ? Most adults should exercise for at least 150 minutes each week. The exercise should increase your heart rate and make you sweat (moderate-intensity exercise). ? Most adults should also do strengthening exercises at least twice a week. This is in addition to the moderate-intensity exercise. Maintain a healthy weight  Body mass index (BMI) is a measurement that can be used to identify possible weight problems. It estimates body fat based on height and weight. Your health care provider can help determine your BMI and help you achieve or maintain a healthy weight.  For females 58 years of age and older: ? A BMI below 18.5 is considered underweight. ? A BMI of 18.5 to 24.9 is normal. ? A BMI of 25 to 29.9 is considered  overweight. ? A BMI of 30 and above is considered obese. Watch levels of cholesterol and blood lipids  You should start having your blood tested for lipids and cholesterol at 72 years of age, then have this test every 5 years.  You may need to have your cholesterol levels checked more often if: ? Your lipid or cholesterol levels are high. ? You are older than 72 years of age. ? You are at high risk for heart disease. Cancer screening Lung Cancer  Lung cancer screening is recommended for adults 51-31 years old who are at high risk for lung cancer because of a history of smoking.  A yearly low-dose CT scan of the lungs is recommended for people who: ? Currently smoke. ? Have quit within the past 15 years. ? Have at least a 30-pack-year history of smoking. A pack year is smoking an average of one pack of cigarettes a day for 1 year.  Yearly screening should continue until it has been 15 years since you quit.  Yearly screening should stop if you develop a health problem that would prevent you from having lung cancer treatment. Breast Cancer  Practice breast self-awareness. This means understanding how your breasts normally appear and feel.  It also means doing regular breast self-exams. Let your health care provider know about any changes, no matter how small.  If you are in your 20s or 30s, you should have a clinical breast exam (CBE) by a health care provider every 1-3 years as part of a regular health  exam.  If you are 40 or older, have a CBE every year. Also consider having a breast X-ray (mammogram) every year.  If you have a family history of breast cancer, talk to your health care provider about genetic screening.  If you are at high risk for breast cancer, talk to your health care provider about having an MRI and a mammogram every year.  Breast cancer gene (BRCA) assessment is recommended for women who have family members with BRCA-related cancers. BRCA-related cancers  include: ? Breast. ? Ovarian. ? Tubal. ? Peritoneal cancers.  Results of the assessment will determine the need for genetic counseling and BRCA1 and BRCA2 testing. Cervical Cancer Your health care provider may recommend that you be screened regularly for cancer of the pelvic organs (ovaries, uterus, and vagina). This screening involves a pelvic examination, including checking for microscopic changes to the surface of your cervix (Pap test). You may be encouraged to have this screening done every 3 years, beginning at age 70.  For women ages 58-65, health care providers may recommend pelvic exams and Pap testing every 3 years, or they may recommend the Pap and pelvic exam, combined with testing for human papilloma virus (HPV), every 5 years. Some types of HPV increase your risk of cervical cancer. Testing for HPV may also be done on women of any age with unclear Pap test results.  Other health care providers may not recommend any screening for nonpregnant women who are considered low risk for pelvic cancer and who do not have symptoms. Ask your health care provider if a screening pelvic exam is right for you.  If you have had past treatment for cervical cancer or a condition that could lead to cancer, you need Pap tests and screening for cancer for at least 20 years after your treatment. If Pap tests have been discontinued, your risk factors (such as having a new sexual partner) need to be reassessed to determine if screening should resume. Some women have medical problems that increase the chance of getting cervical cancer. In these cases, your health care provider may recommend more frequent screening and Pap tests. Colorectal Cancer  This type of cancer can be detected and often prevented.  Routine colorectal cancer screening usually begins at 72 years of age and continues through 72 years of age.  Your health care provider may recommend screening at an earlier age if you have risk factors for  colon cancer.  Your health care provider may also recommend using home test kits to check for hidden blood in the stool.  A small camera at the end of a tube can be used to examine your colon directly (sigmoidoscopy or colonoscopy). This is done to check for the earliest forms of colorectal cancer.  Routine screening usually begins at age 40.  Direct examination of the colon should be repeated every 5-10 years through 72 years of age. However, you may need to be screened more often if early forms of precancerous polyps or small growths are found. Skin Cancer  Check your skin from head to toe regularly.  Tell your health care provider about any new moles or changes in moles, especially if there is a change in a mole's shape or color.  Also tell your health care provider if you have a mole that is larger than the size of a pencil eraser.  Always use sunscreen. Apply sunscreen liberally and repeatedly throughout the day.  Protect yourself by wearing long sleeves, pants, a wide-brimmed hat, and sunglasses  whenever you are outside. Heart disease, diabetes, and high blood pressure  High blood pressure causes heart disease and increases the risk of stroke. High blood pressure is more likely to develop in: ? People who have blood pressure in the high end of the normal range (130-139/85-89 mm Hg). ? People who are overweight or obese. ? People who are African American.  If you are 5-72 years of age, have your blood pressure checked every 3-5 years. If you are 41 years of age or older, have your blood pressure checked every year. You should have your blood pressure measured twice-once when you are at a hospital or clinic, and once when you are not at a hospital or clinic. Record the average of the two measurements. To check your blood pressure when you are not at a hospital or clinic, you can use: ? An automated blood pressure machine at a pharmacy. ? A home blood pressure monitor.  If you are  between 56 years and 26 years old, ask your health care provider if you should take aspirin to prevent strokes.  Have regular diabetes screenings. This involves taking a blood sample to check your fasting blood sugar level. ? If you are at a normal weight and have a low risk for diabetes, have this test once every three years after 72 years of age. ? If you are overweight and have a high risk for diabetes, consider being tested at a younger age or more often. Preventing infection Hepatitis B  If you have a higher risk for hepatitis B, you should be screened for this virus. You are considered at high risk for hepatitis B if: ? You were born in a country where hepatitis B is common. Ask your health care provider which countries are considered high risk. ? Your parents were born in a high-risk country, and you have not been immunized against hepatitis B (hepatitis B vaccine). ? You have HIV or AIDS. ? You use needles to inject street drugs. ? You live with someone who has hepatitis B. ? You have had sex with someone who has hepatitis B. ? You get hemodialysis treatment. ? You take certain medicines for conditions, including cancer, organ transplantation, and autoimmune conditions. Hepatitis C  Blood testing is recommended for: ? Everyone born from [birth year] through 1964. ? Anyone with known risk factors for hepatitis C. Sexually transmitted infections (STIs)  You should be screened for sexually transmitted infections (STIs) including gonorrhea and chlamydia if: ? You are sexually active and are younger than 72 years of age. ? You are older than 72 years of age and your health care provider tells you that you are at risk for this type of infection. ? Your sexual activity has changed since you were last screened and you are at an increased risk for chlamydia or gonorrhea. Ask your health care provider if you are at risk.  If you do not have HIV, but are at risk, it may be recommended that you take  a prescription medicine daily to prevent HIV infection. This is called pre-exposure prophylaxis (PrEP). You are considered at risk if: ? You are sexually active and do not regularly use condoms or know the HIV status of your partner(s). ? You take drugs by injection. ? You are sexually active with a partner who has HIV. Talk with your health care provider about whether you are at high risk of being infected with HIV. If you choose to begin PrEP, you should first be tested for  HIV. You should then be tested every 3 months for as long as you are taking PrEP. Pregnancy  If you are premenopausal and you may become pregnant, ask your health care provider about preconception counseling.  If you may become pregnant, take 400 to 800 micrograms (mcg) of folic acid every day.  If you want to prevent pregnancy, talk to your health care provider about birth control (contraception). Osteoporosis and menopause  Osteoporosis is a disease in which the bones lose minerals and strength with aging. This can result in serious bone fractures. Your risk for osteoporosis can be identified using a bone density scan.  If you are 46 years of age or older, or if you are at risk for osteoporosis and fractures, ask your health care provider if you should be screened.  Ask your health care provider whether you should take a calcium or vitamin D supplement to lower your risk for osteoporosis.  Menopause may have certain physical symptoms and risks.  Hormone replacement therapy may reduce some of these symptoms and risks. Talk to your health care provider about whether hormone replacement therapy is right for you. Follow these instructions at home:  Schedule regular health, dental, and eye exams.  Stay current with your immunizations.  Do not use any tobacco products including cigarettes, chewing tobacco, or electronic cigarettes.  If you are pregnant, do not drink alcohol.  If you are breastfeeding, limit how  much and how often you drink alcohol.  Limit alcohol intake to no more than 1 drink per day for nonpregnant women. One drink equals 12 ounces of beer, 5 ounces of wine, or 1 ounces of hard liquor.  Do not use street drugs.  Do not share needles.  Ask your health care provider for help if you need support or information about quitting drugs.  Tell your health care provider if you often feel depressed.  Tell your health care provider if you have ever been abused or do not feel safe at home. This information is not intended to replace advice given to you by your health care provider. Make sure you discuss any questions you have with your health care provider. Document Released: 06/20/2011 Document Revised: 05/12/2016 Document Reviewed: 09/08/2015 Elsevier Interactive Patient Education  2019 [employer].

## 2019-01-14 ENCOUNTER — Ambulatory Visit (INDEPENDENT_AMBULATORY_CARE_PROVIDER_SITE_OTHER): Payer: BLUE CROSS/BLUE SHIELD | Admitting: Internal Medicine

## 2019-01-14 ENCOUNTER — Encounter: Payer: Self-pay | Admitting: Internal Medicine

## 2019-01-14 VITALS — BP 148/92 | HR 65 | Temp 98.0°F | Resp 16 | Ht 65.0 in | Wt 127.0 lb

## 2019-01-14 DIAGNOSIS — G43909 Migraine, unspecified, not intractable, without status migrainosus: Secondary | ICD-10-CM | POA: Diagnosis not present

## 2019-01-14 DIAGNOSIS — Z0000 Encounter for general adult medical examination without abnormal findings: Secondary | ICD-10-CM

## 2019-01-14 DIAGNOSIS — E559 Vitamin D deficiency, unspecified: Secondary | ICD-10-CM

## 2019-01-14 DIAGNOSIS — M816 Localized osteoporosis [Lequesne]: Secondary | ICD-10-CM | POA: Diagnosis not present

## 2019-01-14 DIAGNOSIS — M797 Fibromyalgia: Secondary | ICD-10-CM

## 2019-01-14 DIAGNOSIS — E782 Mixed hyperlipidemia: Secondary | ICD-10-CM

## 2019-01-14 NOTE — Assessment & Plan Note (Signed)
Walking, works with trainer , does exercise class Taking MVI Vitamin d slightly low - advised taking extra vitamin d dexa due - will order

## 2019-01-14 NOTE — Assessment & Plan Note (Signed)
ascvd risk 14% discussed increased risk per guideline - overall I think she is low risk Does not want to consider a statin Continue regular exercise Healthy diet

## 2019-01-14 NOTE — Assessment & Plan Note (Addendum)
Exercises regularly Has chronic muscle and joint pain Takes gabapentin as needed Will follow up with Dr Estanislado Pandy

## 2019-01-14 NOTE — Assessment & Plan Note (Signed)
Last migraine fall 2019 relpax effective Will continue

## 2019-01-14 NOTE — Assessment & Plan Note (Addendum)
Taking a MVI daily Level slighlty low - advised taking extra vitamin d

## 2019-01-29 ENCOUNTER — Other Ambulatory Visit: Payer: Self-pay | Admitting: Internal Medicine

## 2019-02-13 DIAGNOSIS — H04123 Dry eye syndrome of bilateral lacrimal glands: Secondary | ICD-10-CM | POA: Diagnosis not present

## 2019-02-25 NOTE — Progress Notes (Signed)
Subjective:    Patient ID: [name], female    DOB: [birth year], 73 y.o.   MRN: [id number]  HPI The patient is here for an acute visit.   Right arm pain:  It started about one week ago.  She denies any injury or unusual activities.  It aches constantly from her neck to her shoulder to her knuckles.  The aching is getting worse.  The arm feels heavy.  Her neck is a little stiff.  She denies numbness/tingling.  Her wrist area and shoulder area is swollen and it is constant.  She denies skin changes.  Her ROM is intact, but some movements hurt.    She chronic lymphedema in that arm due to prior LN resection so she always has some swelling.   Tylenol ES eased the pain a little.    Medications and allergies reviewed with patient and updated if appropriate.  Patient Active Problem List   Diagnosis Date Noted  . Acute pain of left knee 08/27/2018  . 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 Trigger middle finger of right hand 06/13/2018  . Sciatica of left side 01/24/2018  . 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  . Intermittent palpitations 10/11/2017  . Intermittent chest pain 10/11/2017  . Migraine 12/30/2015  . Upper airway cough syndrome 10/29/2015  . Osteoporosis 06/06/2011  . Vitamin D deficiency 03/22/2010  . BREAST CANCER, HX OF 03/22/2010  . SPINAL STENOSIS, LUMBAR 03/25/2008  . HYPERLIPIDEMIA 09/28/2007  . Fibromyalgia 05/24/2007    Current Outpatient Medications on File Prior to Visit  Medication Sig Dispense Refill  . eletriptan (RELPAX) 20 MG tablet Take 1 tablet (20 mg total) by mouth every 2 (two) hours as needed for migraine or headache. May repeat in 2 hours if headache persists or recurs. 9 tablet 0  . fluticasone (FLONASE) 50 MCG/ACT nasal spray Place 2 sprays into both nostrils daily. 16 g 6  . gabapentin (NEURONTIN) 100 MG capsule Take 1-3 capsules (100-300 mg total) by mouth at bedtime. 30 capsule 2  . traZODone (DESYREL) 50 MG tablet TAKE 3 TABLETS BY MOUTH AT BEDTIME  90 tablet 1   No current facility-administered medications on file prior to visit.     Past Medical History:  Diagnosis Date  . Arthritis   . Breast cancer ([place])    R lumpectomy ; radiation; no chemotherapy  . Bronchitis    recurrent  . Cataract    multifocal lens implant  . Chronic low back pain    bulging disc & spinal stenosis  . Fibromyalgia   . Hyperlipemia   . Osteoporosis   . Personal history of radiation therapy   . Pneumonia    as OP X 2; no Pneumovax  . Vitamin D deficiency    19 in  01/2009    Past Surgical History:  Procedure Laterality Date  . ABDOMINAL HYSTERECTOMY  1990   [facility]O for fibroid , Dr Maryruth Eve  . BREAST LUMPECTOMY Right   . BREAST SURGERY  2010   R side lumpectomy; Dr Margot Chimes  . CATARACT EXTRACTION, BILATERAL  2017  . CESAREAN SECTION      G 2 P 2  . CYSTECTOMY  2010  . EYE SURGERY     eye lid surgery  . no colonoscopy     "I just don't want to do it". SOC reviewed    Social History   Socioeconomic History  . Marital status: Married    Spouse name: Not on file  . Number of  children: Not on file  . Years of education: Not on file  . Highest education level: Not on file  Occupational History  . Not on file  Social Needs  . Financial resource strain: Not on file  . Food insecurity:    Worry: Not on file    Inability: Not on file  . Transportation needs:    Medical: Not on file    Non-medical: Not on file  Tobacco Use  . Smoking status: Former Smoker    Last attempt to quit: 12/19/1970    Years since quitting: 48.2  . Smokeless tobacco: Never Used  . Tobacco comment: [id number], up to 3 cigarettes / day  Substance and Sexual Activity  . Alcohol use: No  . Drug use: No  . Sexual activity: Not on file  Lifestyle  . Physical activity:    Days per week: Not on file    Minutes per session: Not on file  . Stress: Not on file  Relationships  . Social connections:    Talks on phone: Not on file    Gets together: Not on file     Attends religious service: Not on file    Active member of club or organization: Not on file    Attends meetings of clubs or organizations: Not on file    Relationship status: Not on file  Other Topics Concern  . Not on file  Social History Narrative  . Not on file    Family History  Problem Relation Age of Onset  . Atrial fibrillation Mother        S/P cardioversion  . Ulcers Mother   . Hypertension Mother   . Dementia Mother   . Heart attack Father 33       died @ 4  . Heart attack Paternal Aunt        < 70  . Heart attack Paternal Grandfather        > 55  . Heart attack Paternal Uncle        > 55  . Heart attack Paternal Grandmother        < 44  . Hypertension Brother   . Thyroid disease Daughter        on Synthroid since high school  . Diabetes Brother   . Stroke Neg Hx   . Breast cancer Neg Hx     Review of Systems  Constitutional: Negative for fever.  Musculoskeletal: Positive for myalgias (right trapezius) and neck stiffness. Negative for neck pain.  Skin: Negative for color change.  Neurological: Negative for numbness and headaches.       Objective:   Vitals:   02/26/19 1259  BP: 136/82  Pulse: 64  Resp: 16  Temp: 98.8 F (37.1 C)  SpO2: 98%   BP Readings from Last 3 Encounters:  02/26/19 136/82  01/14/19 (!) 148/92  10/03/18 (!) 148/92   Wt Readings from Last 3 Encounters:  01/14/19 127 lb (57.6 kg)  10/03/18 129 lb 12.8 oz (58.9 kg)  08/27/18 128 lb (58.1 kg)   Body mass index is 21.13 kg/m.   Physical Exam Constitutional:      General: She is not in acute distress.    Appearance: Normal appearance. She is not ill-appearing.  HENT:     Head: Normocephalic and atraumatic.  Cardiovascular:     Comments: Normal right radial and ulna pulses.   Mild diffuse lymphedema of right arm - chronic Musculoskeletal:     Comments: No c-spine tenderness,  tenderness with palpation of right trapezius down to shoulder, no arm pain with palpation,  good ROM of shoulder with mild discomfort.  Slight dec neck looking left  Skin:    General: Skin is warm and dry.     Findings: No erythema or rash.  Neurological:     Mental Status: She is alert.     Sensory: No sensory deficit.     Motor: No weakness.            Assessment & Plan:    See Problem List for Assessment and Plan of chronic medical problems.

## 2019-02-26 ENCOUNTER — Ambulatory Visit (INDEPENDENT_AMBULATORY_CARE_PROVIDER_SITE_OTHER): Payer: BLUE CROSS/BLUE SHIELD | Admitting: Internal Medicine

## 2019-02-26 ENCOUNTER — Encounter: Payer: Self-pay | Admitting: Internal Medicine

## 2019-02-26 VITALS — BP 136/82 | HR 64 | Temp 98.8°F | Resp 16 | Ht 65.0 in

## 2019-02-26 DIAGNOSIS — M79601 Pain in right arm: Secondary | ICD-10-CM | POA: Insufficient documentation

## 2019-02-26 NOTE — Assessment & Plan Note (Signed)
?   Neuropathy or shoulder injury She takes gabapentin prn - advised to try 100 mg HS to see if that helps Continue tylenol Gentle stretching Try heat Will refer to sports medicine for further evaluation

## 2019-02-26 NOTE — Patient Instructions (Addendum)
Take gabapentin 100 mg at night.  Take the tylenol as needed.    Try ice/heat    Make an appointment with Dr Tamala Julian our sports medicine doctor.

## 2019-02-28 ENCOUNTER — Other Ambulatory Visit: Payer: Self-pay

## 2019-02-28 ENCOUNTER — Ambulatory Visit (INDEPENDENT_AMBULATORY_CARE_PROVIDER_SITE_OTHER)
Admission: RE | Admit: 2019-02-28 | Discharge: 2019-02-28 | Disposition: A | Discharge status: Home / Self Care | Payer: BLUE CROSS/BLUE SHIELD | Source: Ambulatory Visit

## 2019-02-28 DIAGNOSIS — M816 Localized osteoporosis [Lequesne]: Secondary | ICD-10-CM

## 2019-03-04 ENCOUNTER — Encounter: Payer: Self-pay | Admitting: Internal Medicine

## 2019-03-04 DIAGNOSIS — M816 Localized osteoporosis [Lequesne]: Secondary | ICD-10-CM | POA: Diagnosis not present

## 2019-03-26 ENCOUNTER — Other Ambulatory Visit: Payer: Self-pay | Admitting: Internal Medicine

## 2019-04-01 ENCOUNTER — Ambulatory Visit: Payer: BLUE CROSS/BLUE SHIELD | Admitting: Family Medicine

## 2019-04-08 DIAGNOSIS — L603 Nail dystrophy: Secondary | ICD-10-CM | POA: Diagnosis not present

## 2019-04-08 DIAGNOSIS — L659 Nonscarring hair loss, unspecified: Secondary | ICD-10-CM | POA: Diagnosis not present

## 2019-04-08 DIAGNOSIS — L821 Other seborrheic keratosis: Secondary | ICD-10-CM | POA: Diagnosis not present

## 2019-04-29 NOTE — Progress Notes (Signed)
[facility] [place], [place] [address] Phone: [phone] Subjective:   [name], am serving as a scribe for Dr. Hulan Saas.  I'm seeing this patient by the request  of:   Binnie Rail, MD   CC: right arm pain   ION:[id number]  [name] is a 73 y.o. female coming in with complaint of right shoulder pain. Pain in anterior shoulder. Notices a "grating." Pain with movement. If she is exercising she will get swelling in the right wrist. Has not tried anything to improve her pain. States that her scapula do not look the same.   Also having left knee pain since December 2019. Patient fell directly on her knee on a brick surface. Anterior knee pain since then. Has not tried anything for her pain.  Patient has numerous other complaints at this time. Has seen primary care provider.  Patient has seen another provider previously and did not have laboratory work-up.  Did have a positive ANA with a 320 titer.  Awaiting going to the rheumatologist.  Santana known spinal stenosis she states.  To CT myelograms patient has had difficulty with the injections and did have unfortunately injury to the soft tissue.  Patient does not want to have anything like that done again.  Looking for what else can possibly be done.  Previously. Past Medical History:  Diagnosis Date  . Arthritis   . Breast cancer ([place])    R lumpectomy ; radiation; Santana chemotherapy  . Bronchitis    recurrent  . Cataract    multifocal lens implant  . Chronic low back pain    bulging disc & spinal stenosis  . Fibromyalgia   . Hyperlipemia   . Osteoporosis   . Personal history of radiation therapy   . Pneumonia    as OP X 2; Santana Pneumovax  . Vitamin D deficiency    19 in  01/2009   Past Surgical History:  Procedure Laterality Date  . ABDOMINAL HYSTERECTOMY  1990   USO for fibroid , Dr Maryruth Eve  . BREAST LUMPECTOMY Right   . BREAST SURGERY  2010   R side lumpectomy; Dr  Margot Chimes  . CATARACT EXTRACTION, BILATERAL  2017  . CESAREAN SECTION      G 2 P 2  . CYSTECTOMY  2010  . EYE SURGERY     eye lid surgery  . Santana colonoscopy     "I just don't want to do it". SOC reviewed   Social History   Socioeconomic History  . Marital status: Married    Spouse name: Not on file  . Number of children: Not on file  . Years of education: Not on file  . Highest education level: Not on file  Occupational History  . Not on file  Social Needs  . Financial resource strain: Not on file  . Food insecurity:    Worry: Not on file    Inability: Not on file  . Transportation needs:    Medical: Not on file    Non-medical: Not on file  Tobacco Use  . Smoking status: Former Smoker    Last attempt to quit: 12/19/1970    Years since quitting: 48.3  . Smokeless tobacco: Never Used  . Tobacco comment: 4401-0272, up to 3 cigarettes / day  Substance and Sexual Activity  . Alcohol use: Santana  . Drug use: Santana  . Sexual activity: Not on file  Lifestyle  . Physical activity:  Days per week: Not on file    Minutes per session: Not on file  . Stress: Not on file  Relationships  . Social connections:    Talks on phone: Not on file    Gets together: Not on file    Attends religious service: Not on file    Active member of club or organization: Not on file    Attends meetings of clubs or organizations: Not on file    Relationship status: Not on file  Other Topics Concern  . Not on file  Social History Narrative  . Not on file   Allergies  Allergen Reactions  . Morphine Hives and Palpitations     Because of a history of documented adverse serious drug reaction;Medi Alert bracelet  is recommended  . Albuterol Palpitations    REACTION: heart racing  . Augmentin [Amoxicillin-Pot Clavulanate]     Abdominal cramping  . Ciprofloxacin     Abdominal cramping  . Fish Allergy Other (See Comments)    Severe headache  . Risedronate Sodium     Gi symptoms  . Ibandronate Sodium      Stomach cramps   Family History  Problem Relation Age of Onset  . Atrial fibrillation Mother        S/P cardioversion  . Ulcers Mother   . Hypertension Mother   . Dementia Mother   . Heart attack Father 72       died @ 21  . Heart attack Paternal Aunt        < 11  . Heart attack Paternal Grandfather        > 55  . Heart attack Paternal Uncle        > 55  . Heart attack Paternal Grandmother        < 20  . Hypertension Brother   . Thyroid disease Daughter        on Synthroid since high school  . Diabetes Brother   . Stroke Neg Hx   . Breast cancer Neg Hx       Current Outpatient Medications (Respiratory):  .  fluticasone (FLONASE) 50 MCG/ACT nasal spray, Place 2 sprays into both nostrils daily.  Current Outpatient Medications (Analgesics):  .  eletriptan (RELPAX) 20 MG tablet, Take 1 tablet (20 mg total) by mouth every 2 (two) hours as needed for migraine or headache. May repeat in 2 hours if headache persists or recurs.   Current Outpatient Medications (Other):  .  gabapentin (NEURONTIN) 100 MG capsule, Take 1-3 capsules (100-300 mg total) by mouth at bedtime. .  traZODone (DESYREL) 50 MG tablet, TAKE 3 TABLETS BY MOUTH AT BEDTIME    Past medical history, social, surgical and family history all reviewed in electronic medical record.  Santana pertanent information unless stated regarding to the chief complaint.   Review of Systems:  Santana headache, visual changes, nausea, vomiting, diarrhea, constipation, dizziness, abdominal pain, skin rash, fevers, chills, night sweats, weight loss, swollen lymph nodes, body aches, joint swelling, chest pain, shortness of breath, mood changes.  Positive muscle aches  Objective  Blood pressure 108/70, pulse 80, height 5\' 5"  (1.651 m), weight 127 lb (57.6 kg), SpO2 98 %.    General: Santana apparent distress alert and oriented x3 mood and affect normal, dressed appropriately.  HEENT: Pupils equal, extraocular movements intact  Respiratory:  Patient's speak in full sentences and does not appear short of breath  Cardiovascular: Santana lower extremity edema, non tender, Santana erythema  Skin: Warm dry  intact with Santana signs of infection or rash on extremities or on axial skeleton.  Abdomen: Soft nontender  Neuro: Cranial nerves II through XII are intact, neurovascularly intact in all extremities with 2+ DTRs and 2+ pulses.  Lymph: Santana lymphadenopathy of posterior or anterior cervical chain or axillae bilaterally.  Gait normal with good balance and coordination.  MSK:  tender with full range of motion and good stability and symmetric strength and tone of  elbows, wrist, hip, knee and ankles bilaterally.  Mild to moderate arthritic changes of multiple joints Shoulder: Right Inspection reveals Santana abnormalities, atrophy or asymmetry. Palpation is normal with Santana tenderness over AC joint or bicipital groove. ROM is full in all planes passively. Rotator cuff strength normal throughout. signs of impingement with positive Neer and Hawkin's tests, but negative empty can sign. Speeds and Yergason's tests normal. Santana labral pathology noted with negative Obrien's, negative clunk and good stability. Normal scapular function observed. Santana painful arc and Santana drop arm sign. Santana apprehension sign  Neck exam shows the patient does have some difficulty with the scoliosis mostly of the thoracolumbar junction.  Patient does have weakness noted of the left scapula with winging.  Lower back exam shows loss of lordosis with degenerative scoliosis.  Patient does have tightness with straight leg test.  Tightness with Corky Sox test as well.  .[id number]; 15 additional minutes spent for Therapeutic exercises as stated in above notes.  This included exercises focusing on stretching, strengthening, with significant focus on eccentric aspects.   Long term goals include an improvement in range of motion, strength, endurance as well as avoiding reinjury. Patient's frequency would include  in 1-2 times a day, 3-5 times a week for a duration of 6-12 weeks. Low back exercises that included:  Pelvic tilt/bracing instruction to focus on control of the pelvic girdle and lower abdominal muscles  Glute strengthening exercises, focusing on proper firing of the glutes without engaging the low back muscles Proper stretching techniques for maximum relief for the hamstrings, hip flexors, low back and some rotation where tolerated    Proper technique shown and discussed handout in great detail with ATC.  All questions were discussed and answered.      Impression and Recommendations:     This case required medical decision making of moderate complexity. The above documentation has been reviewed and is accurate and complete Lyndal Pulley, DO       Note: This dictation was prepared with Dragon dictation along with smaller phrase technology. Any transcriptional errors that result from this process are unintentional.

## 2019-04-30 ENCOUNTER — Ambulatory Visit: Payer: Self-pay

## 2019-04-30 ENCOUNTER — Ambulatory Visit (INDEPENDENT_AMBULATORY_CARE_PROVIDER_SITE_OTHER): Payer: BLUE CROSS/BLUE SHIELD | Admitting: Family Medicine

## 2019-04-30 ENCOUNTER — Encounter: Payer: Self-pay | Admitting: Family Medicine

## 2019-04-30 ENCOUNTER — Other Ambulatory Visit: Payer: Self-pay

## 2019-04-30 VITALS — BP 108/70 | HR 80 | Ht 65.0 in | Wt 127.0 lb

## 2019-04-30 DIAGNOSIS — M48061 Spinal stenosis, lumbar region without neurogenic claudication: Secondary | ICD-10-CM

## 2019-04-30 DIAGNOSIS — M65331 Trigger finger, right middle finger: Secondary | ICD-10-CM

## 2019-04-30 DIAGNOSIS — E559 Vitamin D deficiency, unspecified: Secondary | ICD-10-CM

## 2019-04-30 DIAGNOSIS — M25511 Pain in right shoulder: Secondary | ICD-10-CM

## 2019-04-30 DIAGNOSIS — M25562 Pain in left knee: Secondary | ICD-10-CM | POA: Diagnosis not present

## 2019-04-30 DIAGNOSIS — M797 Fibromyalgia: Secondary | ICD-10-CM | POA: Diagnosis not present

## 2019-04-30 NOTE — Patient Instructions (Signed)
Good to see you  Exercises 3 times a week. Alternate upper and lower back  Ice 20 minutes 2 times daily. Usually after activity and before bed. pennsaid pinkie amount topically 2 times daily as needed.   Over the counter get  Vitamin D 4000-5000IU daily  Turmeric 500mg  daily  Tart cherry extract 1200mg  at night Stay active Stay safe  Follow up with rheumatology  And see me again in 4-6 weeks

## 2019-04-30 NOTE — Assessment & Plan Note (Signed)
History of spinal stenosis.  Wants to avoid any surgical intervention.  Patient encouraged to take the gabapentin.  Avoid extension of the back.  New exercises given.  Follow-up in 4 to 6 weeks

## 2019-04-30 NOTE — Assessment & Plan Note (Signed)
Mild arthritic changes.  On ultrasound.  No true defect noted but may be in the inconsistent change of the lateral tibial plateau.  Could be contributing to pain and likely secondary to be osteoporosis.  Home exercises given for vastus medialis oblique strengthening.  Follow-up again 4 weeks

## 2019-04-30 NOTE — Assessment & Plan Note (Signed)
Discussed possibility of injection which patient declined and will do bracing at night

## 2019-04-30 NOTE — Assessment & Plan Note (Signed)
Patient declined once weekly vitamin D and will try over-the-counter medications.  I do think it will help with patient's muscle strength and endurance

## 2019-04-30 NOTE — Assessment & Plan Note (Signed)
The patient does have some fibromyalgia.  Positive ANA and concern for the possibility of rheumatoid arthritis when I will get a synovitis of the patient's first MCP joints.  Discussed with patient in great length.  Discussed the possibility of using gabapentin which patient has declined using previously.  We discussed home exercises and patient work with Product/process development scientist.  Follow-up again in 4 weeks

## 2019-05-17 ENCOUNTER — Telehealth: Payer: Self-pay

## 2019-05-17 NOTE — Telephone Encounter (Signed)
Spoke with patient that she will not be billed for an [place] as one was not officially performed. Patient voices understanding.

## 2019-05-17 NOTE — Telephone Encounter (Signed)
Copied from [place] [id number]. Topic: General - Inquiry >> May 16, 2019  4:57 PM [name] wrote: Reason for CRM: Patient said she seen something in her mychart about an Ultra Sound. She said she is not sure if she had this done or not. Can someone call her?

## 2019-05-20 DIAGNOSIS — R768 Other specified abnormal immunological findings in serum: Secondary | ICD-10-CM | POA: Diagnosis not present

## 2019-05-20 DIAGNOSIS — M2550 Pain in unspecified joint: Secondary | ICD-10-CM | POA: Diagnosis not present

## 2019-05-23 ENCOUNTER — Other Ambulatory Visit: Payer: Self-pay | Admitting: Internal Medicine

## 2019-05-27 ENCOUNTER — Other Ambulatory Visit: Payer: Self-pay | Admitting: Internal Medicine

## 2019-05-28 ENCOUNTER — Other Ambulatory Visit: Payer: Self-pay

## 2019-05-28 ENCOUNTER — Encounter: Payer: Self-pay | Admitting: Family Medicine

## 2019-05-28 ENCOUNTER — Ambulatory Visit (INDEPENDENT_AMBULATORY_CARE_PROVIDER_SITE_OTHER): Payer: BC Managed Care – PPO | Admitting: Family Medicine

## 2019-05-28 VITALS — BP 110/74 | HR 61 | Ht 65.0 in | Wt 127.0 lb

## 2019-05-28 DIAGNOSIS — M797 Fibromyalgia: Secondary | ICD-10-CM

## 2019-05-28 DIAGNOSIS — M25561 Pain in right knee: Secondary | ICD-10-CM

## 2019-05-28 DIAGNOSIS — G8929 Other chronic pain: Secondary | ICD-10-CM

## 2019-05-28 DIAGNOSIS — M25562 Pain in left knee: Secondary | ICD-10-CM

## 2019-05-28 MED ORDER — VITAMIN D (ERGOCALCIFEROL) 1.25 MG (50000 UNIT) PO CAPS: 50000.0000 [IU] | ORAL_CAPSULE | ORAL | 0 refills | Status: DC

## 2019-05-28 MED ORDER — VENLAFAXINE HCL ER 37.5 MG PO CP24: 37.5000 mg | ORAL_CAPSULE | Freq: Every day | ORAL | 1 refills | Status: DC

## 2019-05-28 NOTE — Assessment & Plan Note (Signed)
Patient has multiple different complaints at this time.  Discussed with different medications.  Patient after a long discussion has decided to try of Effexor.  Encouraged to gabapentin at bedtime.  Possible trazodone still.  Patient would consider formal physical therapy.  Patient to do once weekly vitamin D.  Patient was to consider injections which patient declined as well.  Follow-up again in 6 weeks spent  25 minutes with patient face-to-face and had greater than 50% of counseling including as described above in assessment and plan.

## 2019-05-28 NOTE — Patient Instructions (Addendum)
Good to see you.  Effexor  Once weekly vitamin D for 12 weeks.  Calf sleeve for the calf See me again in 2 months

## 2019-05-28 NOTE — Progress Notes (Signed)
[facility] [address] [place], [place] [address] Phone: [phone] Subjective:   I [name] am serving as a Education administrator for Dr. Hulan Saas.  I'm seeing this patient by the request  of:    CC: Knee and shoulder pain  ION:[id number]     05/28/2019 [name] is a 73 y.o. female coming in with complaint of right shoulder and pain everywhere else. Shoulder is not doing any better. Left knee is painful. Back is also painful as well is pain in the right leg.   Onset- Chronic, right leg pain for about a week Location - lateral right leg   Severity-patient rates it 9 out of 10.  States that she is very angry and the progress so far.  Does not feel like she is making any significant improvement.     Past Medical History:  Diagnosis Date  . Arthritis   . Breast cancer ([place])    R lumpectomy ; radiation; no chemotherapy  . Bronchitis    recurrent  . Cataract    multifocal lens implant  . Chronic low back pain    bulging disc & spinal stenosis  . Fibromyalgia   . Hyperlipemia   . Osteoporosis   . Personal history of radiation therapy   . Pneumonia    as OP X 2; no Pneumovax  . Vitamin D deficiency    19 in  01/2009   Past Surgical History:  Procedure Laterality Date  . ABDOMINAL HYSTERECTOMY  1990   USO for fibroid , Dr Maryruth Eve  . BREAST LUMPECTOMY Right   . BREAST SURGERY  2010   R side lumpectomy; Dr Margot Chimes  . CATARACT EXTRACTION, BILATERAL  2017  . CESAREAN SECTION      G 2 P 2  . CYSTECTOMY  2010  . EYE SURGERY     eye lid surgery  . no colonoscopy     "I just don't want to do it". SOC reviewed   Social History   Socioeconomic History  . Marital status: Married    Spouse name: Not on file  . Number of children: Not on file  . Years of education: Not on file  . Highest education level: Not on file  Occupational History  . Not on file  Social Needs  . Financial resource strain: Not on file  . Food insecurity:   Worry: Not on file    Inability: Not on file  . Transportation needs:    Medical: Not on file    Non-medical: Not on file  Tobacco Use  . Smoking status: Former Smoker    Last attempt to quit: 12/19/1970    Years since quitting: 48.4  . Smokeless tobacco: Never Used  . Tobacco comment: [id number], up to 3 cigarettes / day  Substance and Sexual Activity  . Alcohol use: No  . Drug use: No  . Sexual activity: Not on file  Lifestyle  . Physical activity:    Days per week: Not on file    Minutes per session: Not on file  . Stress: Not on file  Relationships  . Social connections:    Talks on phone: Not on file    Gets together: Not on file    Attends religious service: Not on file    Active member of club or organization: Not on file    Attends meetings of clubs or organizations: Not on file    Relationship status: Not on file  Other  Topics Concern  . Not on file  Social History Narrative  . Not on file   Allergies  Allergen Reactions  . Morphine Hives and Palpitations     Because of a history of documented adverse serious drug reaction;Medi Alert bracelet  is recommended  . Albuterol Palpitations    REACTION: heart racing  . Augmentin [Amoxicillin-Pot Clavulanate]     Abdominal cramping  . Ciprofloxacin     Abdominal cramping  . Fish Allergy Other (See Comments)    Severe headache  . Risedronate Sodium     Gi symptoms  . Ibandronate Sodium     Stomach cramps   Family History  Problem Relation Age of Onset  . Atrial fibrillation Mother        S/P cardioversion  . Ulcers Mother   . Hypertension Mother   . Dementia Mother   . Heart attack Father 24       died @ 63  . Heart attack Paternal Aunt        < 61  . Heart attack Paternal Grandfather        > 55  . Heart attack Paternal Uncle        > 55  . Heart attack Paternal Grandmother        < 41  . Hypertension Brother   . Thyroid disease Daughter        on Synthroid since high school  . Diabetes Brother    . Stroke Neg Hx   . Breast cancer Neg Hx       Current Outpatient Medications (Respiratory):  .  fluticasone (FLONASE) 50 MCG/ACT nasal spray, Place 2 sprays into both nostrils daily.  Current Outpatient Medications (Analgesics):  .  eletriptan (RELPAX) 20 MG tablet, TAKE 1 TABLET BY MOUTH EVERY 2 HOURS AS NEEDED FOR HEADACHE OR MIGRAINE. MAY REPEAT IN 2 HOURS IF HEADACHE PERSISTS OR RECURS   Current Outpatient Medications (Other):  .  gabapentin (NEURONTIN) 100 MG capsule, Take 1-3 capsules (100-300 mg total) by mouth at bedtime. .  traZODone (DESYREL) 50 MG tablet, TAKE 3 TABLETS BY MOUTH AT BEDTIME .  venlafaxine XR (EFFEXOR XR) 37.5 MG 24 hr capsule, Take 1 capsule (37.5 mg total) by mouth daily with breakfast. .  Vitamin D, Ergocalciferol, (DRISDOL) 1.25 MG (50000 UT) CAPS capsule, Take 1 capsule (50,000 Units total) by mouth every 7 (seven) days.    Past medical history, social, surgical and family history all reviewed in electronic medical record.  No pertanent information unless stated regarding to the chief complaint.   Review of Systems:  No headache, visual changes, nausea, vomiting, diarrhea, constipation, dizziness, abdominal pain, skin rash, fevers, chills, night sweats, weight loss, swollen lymph nodes, , chest pain, shortness of breath, mood changes.  Positive muscle aches, body aches  Objective  Blood pressure 110/74, pulse 61, height 5\' 5"  (1.651 m), weight 127 lb (57.6 kg), SpO2 98 %.    General: No apparent distress alert and oriented x3 mood and affect normal, dressed appropriately.  HEENT: Pupils equal, extraocular movements intact  Respiratory: Patient's speak in full sentences and does not appear short of breath  Cardiovascular: No lower extremity edema, non tender, no erythema  Skin: Warm dry intact with no signs of infection or rash on extremities or on axial skeleton.  Abdomen: Soft nontender  Neuro: Cranial nerves II through XII are intact,  neurovascularly intact in all extremities with 2+ DTRs and 2+ pulses.  Lymph: No lymphadenopathy of posterior or anterior  cervical chain or axillae bilaterally.  Gait antalgic MSK: Arthritic changes of multiple joints but does not show any significant instability.  Patient is diffusely tender even to light palpation.    Impression and Recommendations:     This case required medical decision making of moderate complexity. The above documentation has been reviewed and is accurate and complete Lyndal Pulley, DO       Note: This dictation was prepared with Dragon dictation along with smaller phrase technology. Any transcriptional errors that result from this process are unintentional.

## 2019-06-11 DIAGNOSIS — M25562 Pain in left knee: Secondary | ICD-10-CM | POA: Diagnosis not present

## 2019-06-11 DIAGNOSIS — M25561 Pain in right knee: Secondary | ICD-10-CM | POA: Diagnosis not present

## 2019-07-08 ENCOUNTER — Ambulatory Visit: Payer: BC Managed Care – PPO

## 2019-07-08 ENCOUNTER — Telehealth: Payer: Self-pay

## 2019-07-08 ENCOUNTER — Ambulatory Visit: Payer: Self-pay | Admitting: Internal Medicine

## 2019-07-08 VITALS — BP 158/96

## 2019-07-08 DIAGNOSIS — I10 Essential (primary) hypertension: Secondary | ICD-10-CM

## 2019-07-08 DIAGNOSIS — R030 Elevated blood-pressure reading, without diagnosis of hypertension: Secondary | ICD-10-CM | POA: Insufficient documentation

## 2019-07-08 NOTE — Progress Notes (Signed)
Subjective:    Patient ID: [name], female    DOB: 07/15/1946, 73 y.o.   MRN: [id number]  HPI The patient is here for an acute visit.   Elevated BP:  Her BP is consistently elevated at home.  Numbers at home have been more than 140/90.  174/108 was the highest, mostly 150-160's/90's.  This has occurred in the past and it did return to normal.  She denies any possible causes.  She is not taking any otc medications.  She denies changes in meds or diet.   She is still having b/l knee pain and would like to see ortho.   Medications and allergies reviewed with patient and updated if appropriate.  Patient Active Problem List   Diagnosis Date Noted  . Elevated blood pressure reading 07/08/2019  . Right arm pain 02/26/2019  . Acute pain of left knee 08/27/2018  . 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 Trigger middle finger of right hand 06/13/2018  . Sciatica of left side 01/24/2018  . 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  . Intermittent palpitations 10/11/2017  . Intermittent chest pain 10/11/2017  . Migraine 12/30/2015  . Upper airway cough syndrome 10/29/2015  . Osteoporosis 06/06/2011  . Vitamin D deficiency 03/22/2010  . BREAST CANCER, HX OF 03/22/2010  . SPINAL STENOSIS, LUMBAR 03/25/2008  . HYPERLIPIDEMIA 09/28/2007  . Fibromyalgia 05/24/2007    Current Outpatient Medications on File Prior to Visit  Medication Sig Dispense Refill  . eletriptan (RELPAX) 20 MG tablet TAKE 1 TABLET BY MOUTH EVERY 2 HOURS AS NEEDED FOR HEADACHE OR MIGRAINE. MAY REPEAT IN 2 HOURS IF HEADACHE PERSISTS OR RECURS 9 tablet 1  . fluticasone (FLONASE) 50 MCG/ACT nasal spray Place 2 sprays into both nostrils daily. 16 g 6  . gabapentin (NEURONTIN) 100 MG capsule Take 1-3 capsules (100-300 mg total) by mouth at bedtime. 30 capsule 2  . traZODone (DESYREL) 50 MG tablet TAKE 3 TABLETS BY MOUTH AT BEDTIME 90 tablet 1  . venlafaxine XR (EFFEXOR XR) 37.5 MG 24 hr capsule Take 1 capsule (37.5 mg total) by mouth  daily with breakfast. 30 capsule 1  . Vitamin D, Ergocalciferol, (DRISDOL) 1.25 MG (50000 UT) CAPS capsule Take 1 capsule (50,000 Units total) by mouth every 7 (seven) days. 5 capsule 0   No current facility-administered medications on file prior to visit.     Past Medical History:  Diagnosis Date  . Arthritis   . Breast cancer ([place])    R lumpectomy ; radiation; no chemotherapy  . Bronchitis    recurrent  . Cataract    multifocal lens implant  . Chronic low back pain    bulging disc & spinal stenosis  . Fibromyalgia   . Hyperlipemia   . Osteoporosis   . Personal history of radiation therapy   . Pneumonia    as OP X 2; no Pneumovax  . Vitamin D deficiency    18 in  01/2009    Past Surgical History:  Procedure Laterality Date  . ABDOMINAL HYSTERECTOMY  1990   USO for fibroid , Dr Maryruth Eve  . BREAST LUMPECTOMY Right   . BREAST SURGERY  2010   R side lumpectomy; Dr Margot Chimes  . CATARACT EXTRACTION, BILATERAL  2017  . CESAREAN SECTION      G 2 P 2  . CYSTECTOMY  2010  . EYE SURGERY     eye lid surgery  . no colonoscopy     "I just don't want to do it". [place]  reviewed    Social History   Socioeconomic History  . Marital status: Married    Spouse name: Not on file  . Number of children: Not on file  . Years of education: Not on file  . Highest education level: Not on file  Occupational History  . Not on file  Social Needs  . Financial resource strain: Not on file  . Food insecurity    Worry: Not on file    Inability: Not on file  . Transportation needs    Medical: Not on file    Non-medical: Not on file  Tobacco Use  . Smoking status: Former Smoker    Quit date: 12/19/1970    Years since quitting: 48.5  . Smokeless tobacco: Never Used  . Tobacco comment: [id number], up to 3 cigarettes / day  Substance and Sexual Activity  . Alcohol use: No  . Drug use: No  . Sexual activity: Not on file  Lifestyle  . Physical activity    Days per week: Not on file    Minutes  per session: Not on file  . Stress: Not on file  Relationships  . Social Herbalist on phone: Not on file    Gets together: Not on file    Attends religious service: Not on file    Active member of club or organization: Not on file    Attends meetings of clubs or organizations: Not on file    Relationship status: Not on file  Other Topics Concern  . Not on file  Social History Narrative  . Not on file    Family History  Problem Relation Age of Onset  . Atrial fibrillation Mother        S/P cardioversion  . Ulcers Mother   . Hypertension Mother   . Dementia Mother   . Heart attack Father 75       died @ 78  . Heart attack Paternal Aunt        < 76  . Heart attack Paternal Grandfather        > 55  . Heart attack Paternal Uncle        > 55  . Heart attack Paternal Grandmother        < 59  . Hypertension Brother   . Thyroid disease Daughter        on Synthroid since high school  . Diabetes Brother   . Stroke Neg Hx   . Breast cancer Neg Hx     Review of Systems  Respiratory: Negative for shortness of breath.   Cardiovascular: Negative for chest pain, palpitations and leg swelling.  Neurological: Positive for headaches (migraines). Negative for dizziness, light-headedness and numbness.       Feels foggy       Objective:   Vitals:   07/09/19 1548  BP: (!) 152/92  Pulse: 73  Resp: 16  Temp: 98.9 F (37.2 C)  SpO2: 97%   BP Readings from Last 3 Encounters:  07/09/19 (!) 152/92  07/08/19 (!) 158/96  05/28/19 110/74   Wt Readings from Last 3 Encounters:  05/28/19 127 lb (57.6 kg)  04/30/19 127 lb (57.6 kg)  01/14/19 127 lb (57.6 kg)   Body mass index is 21.13 kg/m.   Physical Exam    Constitutional: Appears well-developed and well-nourished. No distress.  HENT:  Head: Normocephalic and atraumatic.  Neck: Neck supple. No tracheal deviation present. No thyromegaly present.  No cervical lymphadenopathy Cardiovascular: Normal rate,  regular  rhythm and normal heart sounds.   No murmur heard. No carotid bruit .  No edema Pulmonary/Chest: Effort normal and breath sounds normal. No respiratory distress. No has no wheezes. No rales.  Skin: Skin is warm and dry. Not diaphoretic.  Psychiatric: Normal mood and affect. Behavior is normal.       Assessment & Plan:    See Problem List for Assessment and Plan of chronic medical problems.

## 2019-07-08 NOTE — Telephone Encounter (Signed)
Pt. Reports she had a migraine headache lat week, and ever since then has had elevated BP readings. This morning it is 165/102. No symptoms other "than I feel a little tired." Warm transfer to [place] in the practice.  Answer Assessment - Initial Assessment Questions 1. BLOOD PRESSURE: "What is the blood pressure?" "Did you take at least two measurements 5 minutes apart?"     165/102 2. ONSET: "When did you take your blood pressure?"     Last week 3. HOW: "How did you obtain the blood pressure?" (e.g., visiting nurse, automatic home BP monitor)     Home monitor 4. HISTORY: "Do you have a history of high blood pressure?"     No 5. MEDICATIONS: "Are you taking any medications for blood pressure?" "Have you missed any doses recently?"     No 6. OTHER SYMPTOMS: "Do you have any symptoms?" (e.g., headache, chest pain, blurred vision, difficulty breathing, weakness)     Migraine last week 7. PREGNANCY: "Is there any chance you are pregnant?" "When was your last menstrual period?"     No  Protocols used: HIGH BLOOD PRESSURE-A-AH

## 2019-07-08 NOTE — Telephone Encounter (Signed)
I refer her to come in, especially if she needs to get blood work done, which we can discuss.  I cannot do telephone call visit.

## 2019-07-08 NOTE — Telephone Encounter (Signed)
Patient came in for nurse visit today to have bp checked---for the last several weeks, readings are 767'W and 110'Y systolic, diastolicnumber has  been upper 90's---today's reading 158/96---patient also had low vitamin D in jan/2020--she did not complete vitamin D prescription because it made her feel nauseated---and she has never had b12 lab drawn, and is reporting some dizziness at times, or just feeling "off" and really tired----I have scheduled office visit with dr burns for 07/09/19 at 3:45---patient wants to know if dr burns prefers telephone visit or office visit for visit tomorrow---routing to dr burns, can you please let patient know which type of office visit you prefer, and she can come in for lab work if needed, but the only virtual visit she can do is by telephone

## 2019-07-08 NOTE — Telephone Encounter (Signed)
I have talked with patient, she has scheduled nurse visit with me today, also bringing home bp device to compare readings

## 2019-07-09 ENCOUNTER — Encounter: Payer: Self-pay | Admitting: Internal Medicine

## 2019-07-09 ENCOUNTER — Other Ambulatory Visit: Payer: Self-pay

## 2019-07-09 ENCOUNTER — Ambulatory Visit (INDEPENDENT_AMBULATORY_CARE_PROVIDER_SITE_OTHER): Payer: BC Managed Care – PPO | Admitting: Internal Medicine

## 2019-07-09 VITALS — BP 152/92 | HR 73 | Temp 98.9°F | Resp 16 | Ht 65.0 in

## 2019-07-09 DIAGNOSIS — M25561 Pain in right knee: Secondary | ICD-10-CM

## 2019-07-09 DIAGNOSIS — R030 Elevated blood-pressure reading, without diagnosis of hypertension: Secondary | ICD-10-CM | POA: Diagnosis not present

## 2019-07-09 DIAGNOSIS — M25562 Pain in left knee: Secondary | ICD-10-CM | POA: Diagnosis not present

## 2019-07-09 NOTE — Telephone Encounter (Signed)
Called patient and informed. She is okay coming in.  When asking about symptoms, she did say that she has had some congestion and a slight cough. She thinks it is related to the weather. Is this okay?

## 2019-07-09 NOTE — Patient Instructions (Addendum)
Continue to monitor your BP at home.   Your BP should be less than 140/90.   Consider an Omron BP cuff.    Let me know how your BP readings are after 1-2 weeks.    A referral was ordered for orthopedics.

## 2019-07-09 NOTE — Assessment & Plan Note (Signed)
Consistently elevated at home and elevated here Unsure of cause, but discussed that we need to treat She would like to monitor for a week or two and then agrees to start a low dose of a medication if still elevated Can start norvasc 2.5 mg daily She will update me in 1-2 weeks with her BP readings

## 2019-07-09 NOTE — Telephone Encounter (Signed)
Yes, ok 

## 2019-07-09 NOTE — Assessment & Plan Note (Signed)
Has pain in both knees and wants to see ortho referred

## 2019-07-16 ENCOUNTER — Telehealth: Payer: Self-pay | Admitting: Internal Medicine

## 2019-07-16 ENCOUNTER — Ambulatory Visit: Payer: Self-pay | Admitting: Internal Medicine

## 2019-07-16 NOTE — Telephone Encounter (Signed)
error 

## 2019-07-16 NOTE — Telephone Encounter (Signed)
High last week and PCP wanted to start her medication and pt refused. BP 136/89, 129/85, 150-160/90 See OV note on 07/09/19. Pt stated that she would like to start on the medication that Dr Quay Burow mentioned at her [place].  Pt advised NT will send note to office and will get a call back tomorrow am.  Reason for Disposition . [4] Systolic BP  >= 680 OR Diastolic >= 80 AND [3] taking BP medications  Answer Assessment - Initial Assessment Questions 1. BLOOD PRESSURE: "What is the blood pressure?" "Did you take at least two measurements 5 minutes apart?"   136/89 129/85, BP's 150-160's/90 2. ONSET: "When did you take your blood pressure?"     1530 3. HOW: "How did you obtain the blood pressure?" (e.g., visiting nurse, automatic home BP monitor)    Automatic cuff BP monitor 4. HISTORY: "Do you have a history of high blood pressure?"     yes 5. MEDICATIONS: "Are you taking any medications for blood pressure?" "Have you missed any doses recently?"     yes 6. OTHER SYMPTOMS: "Do you have any symptoms?" (e.g., headache, chest pain, blurred vision, difficulty breathing, weakness)     no 7. PREGNANCY: "Is there any chance you are pregnant?" "When was your last menstrual period?"     n/a  Protocols used: HIGH BLOOD PRESSURE-A-AH

## 2019-07-17 MED ORDER — AMLODIPINE BESYLATE 2.5 MG PO TABS: 2.5000 mg | ORAL_TABLET | Freq: Every day | ORAL | 5 refills | Status: DC

## 2019-07-17 NOTE — Telephone Encounter (Signed)
Pt would like to be put on BP medication that was mentioned in previous OV

## 2019-07-17 NOTE — Telephone Encounter (Signed)
Pt aware of response.  

## 2019-07-17 NOTE — Addendum Note (Signed)
Addended by: Binnie Rail on: 07/17/2019 10:21 AM   Modules accepted: Orders

## 2019-07-17 NOTE — Telephone Encounter (Signed)
Medication sent it - it is a low dose.     Please have her call in a couple of weeks with her BP readings.

## 2019-07-22 ENCOUNTER — Other Ambulatory Visit: Payer: Self-pay | Admitting: Internal Medicine

## 2019-08-05 ENCOUNTER — Ambulatory Visit: Payer: BC Managed Care – PPO | Admitting: Family Medicine

## 2019-08-29 DIAGNOSIS — H04123 Dry eye syndrome of bilateral lacrimal glands: Secondary | ICD-10-CM | POA: Diagnosis not present

## 2019-08-29 DIAGNOSIS — H26492 Other secondary cataract, left eye: Secondary | ICD-10-CM | POA: Diagnosis not present

## 2019-09-16 DIAGNOSIS — H26491 Other secondary cataract, right eye: Secondary | ICD-10-CM | POA: Diagnosis not present

## 2019-09-18 ENCOUNTER — Other Ambulatory Visit: Payer: Self-pay | Admitting: Internal Medicine

## 2019-10-02 ENCOUNTER — Ambulatory Visit (INDEPENDENT_AMBULATORY_CARE_PROVIDER_SITE_OTHER): Payer: BC Managed Care – PPO

## 2019-10-02 ENCOUNTER — Other Ambulatory Visit: Payer: Self-pay

## 2019-10-02 DIAGNOSIS — Z23 Encounter for immunization: Secondary | ICD-10-CM

## 2019-11-08 DIAGNOSIS — B0239 Other herpes zoster eye disease: Secondary | ICD-10-CM | POA: Diagnosis not present

## 2019-11-18 NOTE — Progress Notes (Signed)
Subjective:    Patient ID: [name], female    DOB: December 10, 1946, 73 y.o.   MRN: [id number]  HPI The patient is here for an acute visit.   A week ago last Friday, she was at her eye doctor and she was diagnosed with shingles.  A few days prior she had blisters on her left forehead, scalp and on her left eyelid.  She was busy and did not address it.  She was prescribed valtrex and finished it this morning.  The area feels itchy, sore and irritated.  She has been feeling like something was in her ear and the ear lobe was irritated and was concerned she had shingles in her ear.  She denies change in hearing.  She has had some shooting pain down her left jaw.    She wanted to come to make sure she did not have shingles in her ear.   .     Medications and allergies reviewed with patient and updated if appropriate.  Patient Active Problem List   Diagnosis Date Noted  . Pain in both knees 07/09/2019  . Elevated blood pressure reading 07/08/2019  . Right arm pain 02/26/2019  . Acute pain of left knee 08/27/2018  . 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 Trigger middle finger of right hand 06/13/2018  . Sciatica of left side 01/24/2018  . 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  . Intermittent palpitations 10/11/2017  . Intermittent chest pain 10/11/2017  . Migraine 12/30/2015  . Upper airway cough syndrome 10/29/2015  . Osteoporosis 06/06/2011  . Vitamin D deficiency 03/22/2010  . BREAST CANCER, HX OF 03/22/2010  . SPINAL STENOSIS, LUMBAR 03/25/2008  . HYPERLIPIDEMIA 09/28/2007  . Fibromyalgia 05/24/2007    Current Outpatient Medications on File Prior to Visit  Medication Sig Dispense Refill  . amLODipine (NORVASC) 2.5 MG tablet Take 1 tablet (2.5 mg total) by mouth daily. 30 tablet 5  . eletriptan (RELPAX) 20 MG tablet TAKE 1 TABLET BY MOUTH EVERY 2 HOURS AS NEEDED FOR HEADACHE OR MIGRAINE. MAY REPEAT IN 2 HOURS IF HEADACHE PERSISTS OR RECURS 9 tablet 1  . fluticasone (FLONASE) 50  MCG/ACT nasal spray Place 2 sprays into both nostrils daily. 16 g 6  . traZODone (DESYREL) 50 MG tablet TAKE 3 TABLETS BY MOUTH AT BEDTIME 90 tablet 1  . Vitamin D, Ergocalciferol, (DRISDOL) 1.25 MG (50000 UT) CAPS capsule Take 1 capsule (50,000 Units total) by mouth every 7 (seven) days. 5 capsule 0   No current facility-administered medications on file prior to visit.     Past Medical History:  Diagnosis Date  . Arthritis   . Breast cancer ([place])    R lumpectomy ; radiation; no chemotherapy  . Bronchitis    recurrent  . Cataract    multifocal lens implant  . Chronic low back pain    bulging disc & spinal stenosis  . Fibromyalgia   . Hyperlipemia   . Osteoporosis   . Personal history of radiation therapy   . Pneumonia    as OP X 2; no Pneumovax  . Vitamin D deficiency    18 in  01/2009    Past Surgical History:  Procedure Laterality Date  . ABDOMINAL HYSTERECTOMY  1990   USO for fibroid , Dr Maryruth Eve  . BREAST LUMPECTOMY Right   . BREAST SURGERY  2010   R side lumpectomy; Dr Margot Chimes  . CATARACT EXTRACTION, BILATERAL  2017  . CESAREAN SECTION      G 2 P  2  . CYSTECTOMY  2010  . EYE SURGERY     eye lid surgery  . no colonoscopy     "I just don't want to do it". SOC reviewed    Social History   Socioeconomic History  . Marital status: Married    Spouse name: Not on file  . Number of children: Not on file  . Years of education: Not on file  . Highest education level: Not on file  Occupational History  . Not on file  Social Needs  . Financial resource strain: Not on file  . Food insecurity    Worry: Not on file    Inability: Not on file  . Transportation needs    Medical: Not on file    Non-medical: Not on file  Tobacco Use  . Smoking status: Former Smoker    Quit date: 12/19/1970    Years since quitting: 48.9  . Smokeless tobacco: Never Used  . Tobacco comment: [id number], up to 3 cigarettes / day  Substance and Sexual Activity  . Alcohol use: No  . Drug  use: No  . Sexual activity: Not on file  Lifestyle  . Physical activity    Days per week: Not on file    Minutes per session: Not on file  . Stress: Not on file  Relationships  . Social Herbalist on phone: Not on file    Gets together: Not on file    Attends religious service: Not on file    Active member of club or organization: Not on file    Attends meetings of clubs or organizations: Not on file    Relationship status: Not on file  Other Topics Concern  . Not on file  Social History Narrative  . Not on file    Family History  Problem Relation Age of Onset  . Atrial fibrillation Mother        S/P cardioversion  . Ulcers Mother   . Hypertension Mother   . Dementia Mother   . Heart attack Father 37       died @ 33  . Heart attack Paternal Aunt        < 73  . Heart attack Paternal Grandfather        > 55  . Heart attack Paternal Uncle        > 55  . Heart attack Paternal Grandmother        < 55  . Hypertension Brother   . Thyroid disease Daughter        on Synthroid since high school  . Diabetes Brother   . Stroke Neg Hx   . Breast cancer Neg Hx     Review of Systems  Constitutional: Positive for fatigue. Negative for fever.  HENT: Positive for ear pain (Mild soreness inside left ear-intermittent). Negative for hearing loss.   Eyes: Negative for visual disturbance.  Skin: Positive for rash (Resolving).  Neurological: Negative for dizziness, light-headedness and headaches.       Objective:   Vitals:   11/19/19 1332  BP: (!) 142/88  Pulse: 62  Resp: 16  Temp: 98.3 F (36.8 C)  SpO2: 99%   BP Readings from Last 3 Encounters:  11/19/19 (!) 142/88  07/09/19 (!) 152/92  07/08/19 (!) 158/96   Wt Readings from Last 3 Encounters:  05/28/19 127 lb (57.6 kg)  04/30/19 127 lb (57.6 kg)  01/14/19 127 lb (57.6 kg)   Body mass index is 21.13  kg/m.   Physical Exam Constitutional:      General: She is not in acute distress.    Appearance:  Normal appearance. She is not ill-appearing.  HENT:     Left Ear: Tympanic membrane, ear canal and external ear normal.  Eyes:     General:        Right eye: No discharge.        Left eye: No discharge.     Conjunctiva/sclera: Conjunctivae normal.  Skin:    General: Skin is warm and dry.     Findings: Erythema (Area of erythema on left upper forehead from residual shingles rash, few scabbed lesions in the anterior left scalp, no active blisters, erythema left upper eyelid with slight swelling of eyelid) present.  Neurological:     Mental Status: She is alert.            Assessment & Plan:    See Problem List for Assessment and Plan of chronic medical problems.    This visit occurred during the SARS-CoV-2 public health emergency.  Safety protocols were in place, including screening questions prior to the visit, additional usage of staff PPE, and extensive cleaning of exam room while observing appropriate contact time as indicated for disinfecting solutions.

## 2019-11-19 ENCOUNTER — Ambulatory Visit (INDEPENDENT_AMBULATORY_CARE_PROVIDER_SITE_OTHER): Payer: BC Managed Care – PPO | Admitting: Internal Medicine

## 2019-11-19 ENCOUNTER — Other Ambulatory Visit: Payer: Self-pay

## 2019-11-19 ENCOUNTER — Encounter: Payer: Self-pay | Admitting: Internal Medicine

## 2019-11-19 ENCOUNTER — Other Ambulatory Visit: Payer: Self-pay | Admitting: Internal Medicine

## 2019-11-19 DIAGNOSIS — B0230 Zoster ocular disease, unspecified: Secondary | ICD-10-CM | POA: Diagnosis not present

## 2019-11-19 DIAGNOSIS — B029 Zoster without complications: Secondary | ICD-10-CM | POA: Insufficient documentation

## 2019-11-19 MED ORDER — TRAZODONE HCL 50 MG PO TABS: 150.0000 mg | ORAL_TABLET | Freq: Every day | ORAL | 1 refills | Status: DC

## 2019-11-19 NOTE — Patient Instructions (Signed)
Shingles  Shingles, which is also known as herpes zoster, is an infection that causes a painful skin rash and fluid-filled blisters. It is caused by a virus. Shingles only develops in people who:  Have had chickenpox.  Have been given a medicine to protect against chickenpox (have been vaccinated). Shingles is rare in this group. What are the causes? Shingles is caused by varicella-zoster virus (VZV). This is the same virus that causes chickenpox. After a person is exposed to VZV, the virus stays in the body in an inactive (dormant) state. Shingles develops if the virus is reactivated. This can happen many years after the first (initial) exposure to VZV. It is not known what causes this virus to be reactivated. What increases the risk? People who have had chickenpox or received the chickenpox vaccine are at risk for shingles. Shingles infection is more common in people who:  Are older than age 60.  Have a weakened disease-fighting system (immune system), such as people with: ? HIV. ? AIDS. ? Cancer.  Are taking medicines that weaken the immune system, such as transplant medicines.  Are experiencing a lot of stress. What are the signs or symptoms? Early symptoms of this condition include itching, tingling, and pain in an area on your skin. Pain may be described as burning, stabbing, or throbbing. A few days or weeks after early symptoms start, a painful red rash appears. The rash is usually on one side of the body and has a band-like or belt-like pattern. The rash eventually turns into fluid-filled blisters that break open, change into scabs, and dry up in about 2-3 weeks. At any time during the infection, you may also develop:  A fever.  Chills.  A headache.  An upset stomach. How is this diagnosed? This condition is diagnosed with a skin exam. Skin or fluid samples may be taken from the blisters before a diagnosis is made. These samples are examined under a microscope or sent to  a lab for testing. How is this treated? The rash may last for several weeks. There is not a specific cure for this condition. Your health care provider will probably prescribe medicines to help you manage pain, recover more quickly, and avoid long-term problems. Medicines may include:  Antiviral drugs.  Anti-inflammatory drugs.  Pain medicines.  Anti-itching medicines (antihistamines). If the area involved is on your face, you may be referred to a specialist, such as an eye doctor (ophthalmologist) or an ear, nose, and throat (ENT) doctor (otolaryngologist) to help you avoid eye problems, chronic pain, or disability. Follow these instructions at home: Medicines  Take over-the-counter and prescription medicines only as told by your health care provider.  Apply an anti-itch cream or numbing cream to the affected area as told by your health care provider. Relieving itching and discomfort   Apply cold, wet cloths (cold compresses) to the area of the rash or blisters as told by your health care provider.  Cool baths can be soothing. Try adding baking soda or dry oatmeal to the water to reduce itching. Do not bathe in hot water. Blister and rash care  Keep your rash covered with a loose bandage (dressing). Wear loose-fitting clothing to help ease the pain of material rubbing against the rash.  Keep your rash and blisters clean by washing the area with mild soap and cool water as told by your health care provider.  Check your rash every day for signs of infection. Check for: ? More redness, swelling, or pain. ? Fluid   or blood. ? Warmth. ? Pus or a bad smell.  Do not scratch your rash or pick at your blisters. To help avoid scratching: ? Keep your fingernails clean and cut short. ? Wear gloves or mittens while you sleep, if scratching is a problem. General instructions  Rest as told by your health care provider.  Keep all follow-up visits as told by your health care provider. This  is important.  Wash your hands often with soap and water. If soap and water are not available, use hand sanitizer. Doing this lowers your chance of getting a bacterial skin infection.  Before your blisters change into scabs, your shingles infection can cause chickenpox in people who have never had it or have never been vaccinated against it. To prevent this from happening, avoid contact with other people, especially: ? Babies. ? Pregnant women. ? Children who have eczema. ? Elderly people who have transplants. ? People who have chronic illnesses, such as cancer or AIDS. Contact a health care provider if:  Your pain is not relieved with prescribed medicines.  Your pain does not get better after the rash heals.  You have signs of infection in the rash area, such as: ? More redness, swelling, or pain around the rash. ? Fluid or blood coming from the rash. ? The rash area feeling warm to the touch. ? Pus or a bad smell coming from the rash. Get help right away if:  The rash is on your face or nose.  You have facial pain, pain around your eye area, or loss of feeling on one side of your face.  You have difficulty seeing.  You have ear pain or have ringing in your ear.  You have a loss of taste.  Your condition gets worse. Summary  Shingles, which is also known as herpes zoster, is an infection that causes a painful skin rash and fluid-filled blisters.  This condition is diagnosed with a skin exam. Skin or fluid samples may be taken from the blisters and examined before the diagnosis is made.  Keep your rash covered with a loose bandage (dressing). Wear loose-fitting clothing to help ease the pain of material rubbing against the rash.  Before your blisters change into scabs, your shingles infection can cause chickenpox in people who have never had it or have never been vaccinated against it. This information is not intended to replace advice given to you by your health care  provider. Make sure you discuss any questions you have with your health care provider. Document Released: 12/05/2005 Document Revised: 03/29/2019 Document Reviewed: 08/09/2017 Elsevier Patient Education  2020 [employer]  

## 2019-11-19 NOTE — Assessment & Plan Note (Signed)
Shingles left V1 distribution Diagnosed over a week ago by her ophthalmologist and completed Valtrex this morning Has some itching, soreness and irritation Had some concerning symptoms in the left ear and was concerned she may have shingles there-no evidence of lesions and she is not experiencing any hearing loss No further treatment needed Discussed pain relief if needed-she did not tolerate gabapentin in the past Can take Tylenol or ibuprofen Can try topical lidocaine gel, cool packs Advised her to call with any questions or concerns Has follow-up with her ophthalmologist this week

## 2019-11-21 DIAGNOSIS — B0239 Other herpes zoster eye disease: Secondary | ICD-10-CM | POA: Diagnosis not present

## 2020-01-04 ENCOUNTER — Encounter: Payer: Self-pay | Admitting: Internal Medicine

## 2020-01-08 ENCOUNTER — Ambulatory Visit: Payer: BC Managed Care – PPO | Attending: Internal Medicine

## 2020-01-08 DIAGNOSIS — Z23 Encounter for immunization: Secondary | ICD-10-CM | POA: Insufficient documentation

## 2020-01-08 NOTE — Progress Notes (Signed)
   Covid-19 Vaccination Clinic  Name:  [name]    MRN: [id number] DOB: 07-16-1946  01/08/2020  [name] was observed post Covid-19 immunization for 15 minutes without incidence. She was provided with Vaccine Information Sheet and instruction to access the V-Safe system.   [name] was instructed to call 911 with any severe reactions post vaccine: Marland Kitchen Difficulty breathing  . Swelling of your face and throat  . A fast heartbeat  . A bad rash all over your body  . Dizziness and weakness    Immunizations Administered    Name Date Dose VIS Date Route   Pfizer COVID-19 Vaccine 01/08/2020 12:06 PM 0.3 mL 11/29/2019 Intramuscular   Manufacturer: [place]   Lot: [id number]   [place]: [id number]

## 2020-01-18 ENCOUNTER — Encounter: Payer: Self-pay | Admitting: Internal Medicine

## 2020-01-20 ENCOUNTER — Other Ambulatory Visit: Payer: Self-pay

## 2020-01-20 MED ORDER — TRAZODONE HCL 50 MG PO TABS: 150.0000 mg | ORAL_TABLET | Freq: Every day | ORAL | 1 refills | Status: DC

## 2020-01-24 ENCOUNTER — Encounter: Payer: Self-pay | Admitting: Internal Medicine

## 2020-01-27 ENCOUNTER — Ambulatory Visit: Payer: BC Managed Care – PPO | Attending: Internal Medicine

## 2020-01-27 DIAGNOSIS — Z23 Encounter for immunization: Secondary | ICD-10-CM | POA: Insufficient documentation

## 2020-01-27 NOTE — Progress Notes (Signed)
   Covid-19 Vaccination Clinic  Name:  [name]    MRN: [id number] DOB: 03-14-1946  01/27/2020  [name] was observed post Covid-19 immunization for 15 minutes without incidence. She was provided with Vaccine Information Sheet and instruction to access the V-Safe system.   [name] was instructed to call 911 with any severe reactions post vaccine: Marland Kitchen Difficulty breathing  . Swelling of your face and throat  . A fast heartbeat  . A bad rash all over your body  . Dizziness and weakness    Immunizations Administered    Name Date Dose VIS Date Route   Pfizer COVID-19 Vaccine 01/27/2020  1:44 PM 0.3 mL 11/29/2019 Intramuscular   Manufacturer: [place]   Lot: [id number]   [place]: [id number]

## 2020-02-13 ENCOUNTER — Telehealth: Payer: Self-pay

## 2020-02-13 DIAGNOSIS — Z0000 Encounter for general adult medical examination without abnormal findings: Secondary | ICD-10-CM

## 2020-02-13 DIAGNOSIS — M816 Localized osteoporosis [Lequesne]: Secondary | ICD-10-CM

## 2020-02-13 DIAGNOSIS — Z131 Encounter for screening for diabetes mellitus: Secondary | ICD-10-CM

## 2020-02-13 DIAGNOSIS — E782 Mixed hyperlipidemia: Secondary | ICD-10-CM

## 2020-02-13 DIAGNOSIS — E559 Vitamin D deficiency, unspecified: Secondary | ICD-10-CM

## 2020-02-13 NOTE — Telephone Encounter (Signed)
New message   The patient has an upcoming Physical on  3.23.21 asking for blood work before the visit.    The patient is asking for a callback   Please advise

## 2020-02-14 NOTE — Telephone Encounter (Signed)
Blood work ordered.

## 2020-02-14 NOTE — Telephone Encounter (Signed)
Appointment scheduled.

## 2020-02-14 NOTE — Telephone Encounter (Signed)
LVM for pt to call back to schedule an appointment for labs.

## 2020-03-04 ENCOUNTER — Emergency Department (HOSPITAL_COMMUNITY): Payer: BC Managed Care – PPO

## 2020-03-04 ENCOUNTER — Emergency Department (HOSPITAL_COMMUNITY)
Admission: EM | Admit: 2020-03-04 | Discharge: 2020-03-04 | Disposition: A | Discharge status: Home / Self Care | Payer: BC Managed Care – PPO | Attending: Emergency Medicine | Admitting: Emergency Medicine

## 2020-03-04 ENCOUNTER — Telehealth: Payer: Self-pay

## 2020-03-04 ENCOUNTER — Encounter (HOSPITAL_COMMUNITY): Payer: Self-pay | Admitting: Emergency Medicine

## 2020-03-04 DIAGNOSIS — Z87891 Personal history of nicotine dependence: Secondary | ICD-10-CM | POA: Diagnosis not present

## 2020-03-04 DIAGNOSIS — R079 Chest pain, unspecified: Secondary | ICD-10-CM | POA: Insufficient documentation

## 2020-03-04 DIAGNOSIS — R05 Cough: Secondary | ICD-10-CM | POA: Insufficient documentation

## 2020-03-04 DIAGNOSIS — Z79899 Other long term (current) drug therapy: Secondary | ICD-10-CM | POA: Insufficient documentation

## 2020-03-04 DIAGNOSIS — Z853 Personal history of malignant neoplasm of breast: Secondary | ICD-10-CM | POA: Diagnosis not present

## 2020-03-04 DIAGNOSIS — I10 Essential (primary) hypertension: Secondary | ICD-10-CM | POA: Diagnosis not present

## 2020-03-04 DIAGNOSIS — R0789 Other chest pain: Secondary | ICD-10-CM | POA: Diagnosis not present

## 2020-03-04 DIAGNOSIS — R059 Cough, unspecified: Secondary | ICD-10-CM

## 2020-03-04 DIAGNOSIS — R457 State of emotional shock and stress, unspecified: Secondary | ICD-10-CM | POA: Diagnosis not present

## 2020-03-04 HISTORY — DX: Nonrheumatic mitral (valve) prolapse: I34.1

## 2020-03-04 LAB — CBC
HCT: 41.4 % (ref 36.0–46.0)
Hemoglobin: 12.9 g/dL (ref 12.0–15.0)
MCH: 31.1 pg (ref 26.0–34.0)
MCHC: 31.2 g/dL (ref 30.0–36.0)
MCV: 99.8 fL (ref 80.0–100.0)
Platelets: 257 10*3/uL (ref 150–400)
RBC: 4.15 MIL/uL (ref 3.87–5.11)
RDW: 12.7 % (ref 11.5–15.5)
WBC: 3.8 10*3/uL — ABNORMAL LOW (ref 4.0–10.5)
nRBC: 0 % (ref 0.0–0.2)

## 2020-03-04 LAB — BASIC METABOLIC PANEL
Anion gap: 14 (ref 5–15)
BUN: 20 mg/dL (ref 8–23)
CO2: 20 mmol/L — ABNORMAL LOW (ref 22–32)
Calcium: 9.3 mg/dL (ref 8.9–10.3)
Chloride: 105 mmol/L (ref 98–111)
Creatinine, Ser: 0.96 mg/dL (ref 0.44–1.00)
GFR calc Af Amer: >60 mL/min (ref >60)
GFR calc non Af Amer: 59 mL/min — ABNORMAL LOW (ref >60)
Glucose, Bld: 94 mg/dL (ref 70–99)
Potassium: 4.1 mmol/L (ref 3.5–5.1)
Sodium: 139 mmol/L (ref 135–145)

## 2020-03-04 LAB — TROPONIN I (HIGH SENSITIVITY): Troponin I (High Sensitivity): 3 ng/L (ref <18)

## 2020-03-04 MED ORDER — ALUM & MAG HYDROXIDE-SIMETH 200-200-20 MG/5ML PO SUSP
30.0000 mL | Freq: Once | ORAL | Status: AC
  Administered 2020-03-04: 13:00:00 30 mL via ORAL
  Filled 2020-03-04: qty 30

## 2020-03-04 MED ORDER — HYDROCOD POLST-CPM POLST ER 10-8 MG/5ML PO SUER
5.0000 mL | Freq: Once | ORAL | Status: DC
  Filled 2020-03-04: qty 5

## 2020-03-04 MED ORDER — NITROGLYCERIN 0.4 MG SL SUBL: 0.4000 mg | SUBLINGUAL_TABLET | SUBLINGUAL | Status: DC | PRN

## 2020-03-04 NOTE — ED Notes (Signed)
Howard (Husband#[phone]) called.

## 2020-03-04 NOTE — Telephone Encounter (Signed)
In ED

## 2020-03-04 NOTE — ED Notes (Signed)
Patient verbalizes understanding of discharge instructions . Opportunity for questions and answers were provided . Armband removed by staff ,Pt discharged from ED. W/C  offered at D/C  and Declined W/C at D/C and was escorted to lobby by RN.  

## 2020-03-04 NOTE — Telephone Encounter (Signed)
Staff tried several times to reach the patient after call the [facility] RN was dropped.   Called EMS to check on patient. They requested that if we were able to get in touch with the patient to let them know.

## 2020-03-04 NOTE — Telephone Encounter (Signed)
[facility] NOTES:  Patient called on 03/04/2020 9:45:01 AM and states she is having chest pains. Started at 6:45 am pressure. Cannot take a deep breath  Patient was advised to call 911 now. Patient refuses.

## 2020-03-04 NOTE — Telephone Encounter (Signed)
Fyi   The patient verbalized she wanted to speak with Dr. Quay Burow CMA C/O chest discomfort.   Advise patient she will be transferred to team health triage nurse.

## 2020-03-04 NOTE — ED Provider Notes (Signed)
[facility] EMERGENCY DEPARTMENT Provider Note   CSN: [id number] Arrival date & time: 03/04/20  1106     History Chief Complaint  Patient presents with  . Chest Pain    [name] is a 74 y.o. female.  74 yo F with a chief complaint of chest pain.  This is described as left-sided sharp worse with coughing deep breathing.  Woke her up from sleep this morning.  Has been coughing pretty significantly for the past 2 or 3 days.  Has some shortness of breath with the pain.  Denies diaphoresis nausea or vomiting.  Feels that it goes a little bit to the left side otherwise denies any radiation.  Denies history of MI denies hypertension hyperlipidemia diabetes or smoking.  Father had an MI in his mid 27s.  Patient denies history of PE or DVT denies hemoptysis denies recent surgery immobilization or travel.  She has a remote history of cancer.  Has been in remission for some time.  Denies estrogen use.  Has chronic edema to her right lower extremity worse than left but is at baseline.  The history is provided by the patient.  Chest Pain Pain location:  Substernal area and L chest Pain quality: sharp   Pain radiates to:  Does not radiate Pain severity:  Moderate Onset quality:  Gradual Duration:  8 hours Timing:  Constant Progression:  Unchanged Chronicity:  New Relieved by:  Nothing Worsened by:  Coughing, deep breathing, certain positions and movement Ineffective treatments:  None tried Associated symptoms: shortness of breath   Associated symptoms: no dizziness, no fever, no headache, no nausea, no palpitations and no vomiting        Past Medical History:  Diagnosis Date  . Arthritis   . Breast cancer ([place])    R lumpectomy ; radiation; no chemotherapy  . Bronchitis    recurrent  . Cataract    multifocal lens implant  . Chronic low back pain    bulging disc & spinal stenosis  . Fibromyalgia   . Hyperlipemia   . Mitral valve prolapse   .  Osteoporosis   . Personal history of radiation therapy   . Pneumonia    as OP X 2; no Pneumovax  . Vitamin D deficiency    18 in  01/2009    Patient Active Problem List   Diagnosis Date Noted  . Shingles 11/19/2019  . Pain in both knees 07/09/2019  . Elevated blood pressure reading 07/08/2019  . Right arm pain 02/26/2019  . Acute pain of left knee 08/27/2018  . 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 Trigger middle finger of right hand 06/13/2018  . Sciatica of left side 01/24/2018  . 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  . Intermittent palpitations 10/11/2017  . Intermittent chest pain 10/11/2017  . Migraine 12/30/2015  . Upper airway cough syndrome 10/29/2015  . Osteoporosis 06/06/2011  . Vitamin D deficiency 03/22/2010  . BREAST CANCER, HX OF 03/22/2010  . SPINAL STENOSIS, LUMBAR 03/25/2008  . HYPERLIPIDEMIA 09/28/2007  . Fibromyalgia 05/24/2007    Past Surgical History:  Procedure Laterality Date  . ABDOMINAL HYSTERECTOMY  1990   [facility] for fibroid , Dr Maryruth Eve  . BREAST LUMPECTOMY Right   . BREAST SURGERY  2010   R side lumpectomy; Dr Margot Chimes  . CATARACT EXTRACTION, BILATERAL  2017  . CESAREAN SECTION      G 2 P 2  . CYSTECTOMY  2010  . EYE SURGERY     eye lid surgery  .  no colonoscopy     "I just don't want to do it". SOC reviewed     OB History   No obstetric history on file.     Family History  Problem Relation Age of Onset  . Atrial fibrillation Mother        S/P cardioversion  . Ulcers Mother   . Hypertension Mother   . Dementia Mother   . Heart attack Father 27       died @ 70  . Heart attack Paternal Aunt        < 52  . Heart attack Paternal Grandfather        > 55  . Heart attack Paternal Uncle        > 55  . Heart attack Paternal Grandmother        < 17  . Hypertension Brother   . Thyroid disease Daughter        on Synthroid since high school  . Diabetes Brother   . Stroke Neg Hx   . Breast cancer Neg Hx     Social History   Tobacco Use  .  Smoking status: Former Smoker    Quit date: 12/19/1970    Years since quitting: 49.2  . Smokeless tobacco: Never Used  . Tobacco comment: [id number], up to 3 cigarettes / day  Substance Use Topics  . Alcohol use: No  . Drug use: No    Home Medications Prior to Admission medications   Medication Sig Start Date End Date Taking? Authorizing Provider  acetaminophen (TYLENOL) 500 MG tablet Take 1,000 mg by mouth daily as needed for mild pain.   Yes [provider]  cholecalciferol (VITAMIN D3) 25 MCG (1000 UNIT) tablet Take 1,000 Units by mouth daily.   Yes [provider]  eletriptan (RELPAX) 20 MG tablet TAKE 1 TABLET BY MOUTH EVERY 2 HOURS AS NEEDED FOR HEADACHE OR MIGRAINE. MAY REPEAT IN 2 HOURS IF HEADACHE PERSISTS OR RECURS Patient taking differently: Take 20 mg by mouth See admin instructions. Every 2 hours as needed for headache or migraine. May repeat in 2 hours if headache persists. 05/27/19  Yes Burns, Claudina Lick, MD  MAGNESIUM PO Take 1 tablet by mouth daily.   Yes [provider]  traZODone (DESYREL) 50 MG tablet Take 3 tablets (150 mg total) by mouth at bedtime. 01/20/20  Yes Burns, Claudina Lick, MD    Allergies    Morphine, Albuterol, Augmentin [amoxicillin-pot clavulanate], Ciprofloxacin, Fish allergy, Gabapentin, Risedronate sodium, and Ibandronate sodium  Review of Systems   Review of Systems  Constitutional: Negative for chills and fever.  HENT: Negative for congestion and rhinorrhea.   Eyes: Negative for redness and visual disturbance.  Respiratory: Positive for shortness of breath. Negative for wheezing.   Cardiovascular: Positive for chest pain. Negative for palpitations.  Gastrointestinal: Negative for nausea and vomiting.  Genitourinary: Negative for dysuria and urgency.  Musculoskeletal: Negative for arthralgias and myalgias.  Skin: Negative for pallor and wound.  Neurological: Negative for dizziness and headaches.    Physical Exam Updated Vital  Signs BP (!) 161/72   Pulse 67   Temp 98.2 F (36.8 C) (Oral)   Resp 11   Ht 5\' 5"  (1.651 m)   Wt 57.6 kg   SpO2 100%   BMI 21.13 kg/m   Physical Exam Vitals and nursing note reviewed.  Constitutional:      General: She is not in acute distress.    Appearance: She is well-developed. She is  not diaphoretic.  HENT:     Head: Normocephalic and atraumatic.  Eyes:     Pupils: Pupils are equal, round, and reactive to light.  Cardiovascular:     Rate and Rhythm: Normal rate and regular rhythm.     Heart sounds: No murmur. No friction rub. No gallop.   Pulmonary:     Effort: Pulmonary effort is normal.     Breath sounds: No wheezing or rales.  Chest:     Chest wall: Tenderness present.     Comments: Mildly uncomfortable with palpation of the left chest wall but denies that it reproduces her pain. Abdominal:     General: There is no distension.     Palpations: Abdomen is soft.     Tenderness: There is no abdominal tenderness.  Musculoskeletal:        General: No tenderness.     Cervical back: Normal range of motion and neck supple.  Skin:    General: Skin is warm and dry.  Neurological:     Mental Status: She is alert and oriented to person, place, and time.  Psychiatric:        Behavior: Behavior normal.     ED Results / Procedures / Treatments   Labs (all labs ordered are listed, but only abnormal results are displayed) Labs Reviewed  BASIC METABOLIC PANEL - Abnormal; Notable for the following components:      Result Value   CO2 20 (*)    GFR calc non Af Amer 59 (*)    All other components within normal limits  CBC - Abnormal; Notable for the following components:   WBC 3.8 (*)    All other components within normal limits  TROPONIN I (HIGH SENSITIVITY)  TROPONIN I (HIGH SENSITIVITY)    EKG EKG Interpretation  Date/Time:  Wednesday March 04 2020 11:09:29 EDT Ventricular Rate:  72 PR Interval:  170 QRS Duration: 74 QT Interval:  374 QTC Calculation: 409 R  Axis:   56 Text Interpretation: Normal sinus rhythm Possible Left atrial enlargement Borderline ECG biphasic t wave in lead III no longer present Otherwise no significant change Confirmed by Deno Etienne [phone]) on 03/04/2020 12:27:29 PM   Radiology DG Chest 2 View  Result Date: 03/04/2020 CLINICAL DATA:  Chest pain. EXAM: CHEST - 2 VIEW COMPARISON:  Chest radiographs 11/17/2016 and earlier FINDINGS: Heart size within normal limits. Aortic atherosclerosis. No evidence of airspace consolidation within the lungs. No evidence of pleural effusion or pneumothorax. No acute bony abnormality. Thoracic spondylosis. IMPRESSION: No evidence of acute cardiopulmonary abnormality. Aortic atherosclerosis. Electronically Signed   By: Kellie Simmering DO   On: 03/04/2020 12:03    Procedures Procedures (including critical care time)  Medications Ordered in ED Medications  chlorpheniramine-HYDROcodone (TUSSIONEX) 10-8 MG/5ML suspension 5 mL (5 mLs Oral Refused 03/04/20 1311)  nitroGLYCERIN (NITROSTAT) SL tablet 0.4 mg (has no administration in time range)  alum & mag hydroxide-simeth (MAALOX/MYLANTA) 200-200-20 MG/5ML suspension 30 mL (30 mLs Oral Given 03/04/20 1309)    ED Course  I have reviewed the triage vital signs and the nursing notes.  Pertinent labs & imaging results that were available during my care of the patient were reviewed by me and considered in my medical decision making (see chart for details).    MDM Rules/Calculators/A&P                      74 yo F with a chief complaints of cough and left-sided chest pain.  Cough has been going on for about 3 to 4 days.  Woke up this morning with chest pain.  Atypical in nature sharp worse with deep breathing and coughing.  I considered a pulmonary embolism and was offered D-dimer testing which the patient is declining.  She feels that this is more likely muscular that is worse with coughing and deep breathing and somewhat with palpation.  She is willing to  have a delta troponin performed.  Initial troponin is negative EKG is unremarkable.  I went back and discussed the results with the patient.  At this point the patient feels that her symptoms are very unlikely to be a heart attack and she understands the risks and benefits of waiting for a second troponin.  At this time she would like to be discharged and understands that her risk would be somewhere around 2% now with a single troponin will be less than 1% with a repeat.  She will follow-up with her family doctor in the office.  She will return for any worsening symptoms.  2:36 PM:  I have discussed the diagnosis/risks/treatment options with the patient and believe the pt to be eligible for discharge home to follow-up with PCP. We also discussed returning to the ED immediately if new or worsening sx occur. We discussed the sx which are most concerning (e.g., sudden worsening pain, fever, inability to tolerate by mouth) that necessitate immediate return. Medications administered to the patient during their visit and any new prescriptions provided to the patient are listed below.  Medications given during this visit Medications  chlorpheniramine-HYDROcodone (TUSSIONEX) 10-8 MG/5ML suspension 5 mL (5 mLs Oral Refused 03/04/20 1311)  nitroGLYCERIN (NITROSTAT) SL tablet 0.4 mg (has no administration in time range)  alum & mag hydroxide-simeth (MAALOX/MYLANTA) 200-200-20 MG/5ML suspension 30 mL (30 mLs Oral Given 03/04/20 1309)     The patient appears reasonably screen and/or stabilized for discharge and I doubt any other medical condition or other [facility] requiring further screening, evaluation, or treatment in the ED at this time prior to discharge.     Final Clinical Impression(s) / ED Diagnoses Final diagnoses:  Chest pain, atypical  Cough in adult    Rx / DC Orders ED Discharge Orders    None       Deno Etienne, DO 03/04/20 1436

## 2020-03-04 NOTE — Discharge Instructions (Signed)
Follow up with your doctor.  Return for worsening chest pain or shortness of breath.

## 2020-03-04 NOTE — ED Triage Notes (Signed)
Pt here for generalized, non radiating chest pain that woke her out of sleep this morning at 0600. Has had same in the past which resolved after an hour, but this has not resolved and has remained constant. 324 ASA given PTA, pt refused an IV so EMS did not give nitro. Endorses exertional shortness of breath.

## 2020-03-06 ENCOUNTER — Other Ambulatory Visit: Payer: Self-pay

## 2020-03-06 ENCOUNTER — Other Ambulatory Visit (INDEPENDENT_AMBULATORY_CARE_PROVIDER_SITE_OTHER): Payer: BC Managed Care – PPO

## 2020-03-06 ENCOUNTER — Other Ambulatory Visit: Payer: BC Managed Care – PPO

## 2020-03-06 DIAGNOSIS — M816 Localized osteoporosis [Lequesne]: Secondary | ICD-10-CM

## 2020-03-06 DIAGNOSIS — E782 Mixed hyperlipidemia: Secondary | ICD-10-CM | POA: Diagnosis not present

## 2020-03-06 DIAGNOSIS — E559 Vitamin D deficiency, unspecified: Secondary | ICD-10-CM

## 2020-03-06 DIAGNOSIS — Z0000 Encounter for general adult medical examination without abnormal findings: Secondary | ICD-10-CM

## 2020-03-06 DIAGNOSIS — Z131 Encounter for screening for diabetes mellitus: Secondary | ICD-10-CM | POA: Diagnosis not present

## 2020-03-06 LAB — TSH: TSH: 1.91 u[IU]/mL (ref 0.35–4.50)

## 2020-03-06 LAB — CBC WITH DIFFERENTIAL/PLATELET
Basophils Absolute: 0 10*3/uL (ref 0.0–0.1)
Basophils Relative: 1.1 % (ref 0.0–3.0)
Eosinophils Absolute: 0.1 10*3/uL (ref 0.0–0.7)
Eosinophils Relative: 2.6 % (ref 0.0–5.0)
HCT: 38.2 % (ref 36.0–46.0)
Hemoglobin: 13 g/dL (ref 12.0–15.0)
Lymphocytes Relative: 25.5 % (ref 12.0–46.0)
Lymphs Abs: 1 10*3/uL (ref 0.7–4.0)
MCHC: 33.9 g/dL (ref 30.0–36.0)
MCV: 93.5 fl (ref 78.0–100.0)
Monocytes Absolute: 0.3 10*3/uL (ref 0.1–1.0)
Monocytes Relative: 8.3 % (ref 3.0–12.0)
Neutro Abs: 2.4 10*3/uL (ref 1.4–7.7)
Neutrophils Relative %: 62.5 % (ref 43.0–77.0)
Platelets: 269 10*3/uL (ref 150.0–400.0)
RBC: 4.09 Mil/uL (ref 3.87–5.11)
RDW: 13.1 % (ref 11.5–15.5)
WBC: 3.8 10*3/uL — ABNORMAL LOW (ref 4.0–10.5)

## 2020-03-06 LAB — LIPID PANEL
Cholesterol: 234 mg/dL — ABNORMAL HIGH (ref 0–200)
HDL: 66.8 mg/dL (ref >39.00)
LDL Cholesterol: 151 mg/dL — ABNORMAL HIGH (ref 0–99)
NonHDL: 167.18
Total CHOL/HDL Ratio: 4
Triglycerides: 81 mg/dL (ref 0.0–149.0)
VLDL: 16.2 mg/dL (ref 0.0–40.0)

## 2020-03-06 LAB — COMPREHENSIVE METABOLIC PANEL
ALT: 12 U/L (ref 0–35)
AST: 16 U/L (ref 0–37)
Albumin: 4.3 g/dL (ref 3.5–5.2)
Alkaline Phosphatase: 56 U/L (ref 39–117)
BUN: 21 mg/dL (ref 6–23)
CO2: 25 mEq/L (ref 19–32)
Calcium: 9.4 mg/dL (ref 8.4–10.5)
Chloride: 106 mEq/L (ref 96–112)
Creatinine, Ser: 1.07 mg/dL (ref 0.40–1.20)
GFR: 50.26 mL/min — ABNORMAL LOW (ref >60.00)
Glucose, Bld: 98 mg/dL (ref 70–99)
Potassium: 4.2 mEq/L (ref 3.5–5.1)
Sodium: 139 mEq/L (ref 135–145)
Total Bilirubin: 0.6 mg/dL (ref 0.2–1.2)
Total Protein: 7.1 g/dL (ref 6.0–8.3)

## 2020-03-06 LAB — VITAMIN D 25 HYDROXY (VIT D DEFICIENCY, FRACTURES): VITD: 60.29 ng/mL (ref 30.00–100.00)

## 2020-03-06 LAB — HEMOGLOBIN A1C: Hgb A1c MFr Bld: 5.2 % (ref 4.6–6.5)

## 2020-03-09 NOTE — Patient Instructions (Addendum)
Call and schedule your mammogram -  [facility] 7 a.m.-6:30 p.m., Monday 7 a.m.-5 p.m., Tuesday-Friday Schedule an appointment by calling [phone]   Blood work was reviewed.    All other Health Maintenance issues reviewed.   All recommended immunizations and age-appropriate screenings are up-to-date or discussed.  No immunization administered today.   Medications reviewed and updated.  Changes include :   crestor 5 mg twice weekly  Your prescription(s) have been submitted to your pharmacy. Please take as directed and contact our office if you believe you are having problem(s) with the medication(s).    Please followup in 1 year    Health Maintenance, Female Adopting a healthy lifestyle and getting preventive care are important in promoting health and wellness. Ask your health care provider about:  The right schedule for you to have regular tests and exams.  Things you can do on your own to prevent diseases and keep yourself healthy. What should I know about diet, weight, and exercise? Eat a healthy diet   Eat a diet that includes plenty of vegetables, fruits, low-fat dairy products, and lean protein.  Do not eat a lot of foods that are high in solid fats, added sugars, or sodium. Maintain a healthy weight Body mass index (BMI) is used to identify weight problems. It estimates body fat based on height and weight. Your health care provider can help determine your BMI and help you achieve or maintain a healthy weight. Get regular exercise Get regular exercise. This is one of the most important things you can do for your health. Most adults should:  Exercise for at least 150 minutes each week. The exercise should increase your heart rate and make you sweat (moderate-intensity exercise).  Do strengthening exercises at least twice a week. This is in addition to the moderate-intensity exercise.  Spend less time sitting. Even light physical  activity can be beneficial. Watch cholesterol and blood lipids Have your blood tested for lipids and cholesterol at 73 years of age, then have this test every 5 years. Have your cholesterol levels checked more often if:  Your lipid or cholesterol levels are high.  You are older than 73 years of age.  You are at high risk for heart disease. What should I know about cancer screening? Depending on your health history and family history, you may need to have cancer screening at various ages. This may include screening for:  Breast cancer.  Cervical cancer.  Colorectal cancer.  Skin cancer.  Lung cancer. What should I know about heart disease, diabetes, and high blood pressure? Blood pressure and heart disease  High blood pressure causes heart disease and increases the risk of stroke. This is more likely to develop in people who have high blood pressure readings, are of African descent, or are overweight.  Have your blood pressure checked: ? Every 3-5 years if you are 42-81 years of age. ? Every year if you are 17 years old or older. Diabetes Have regular diabetes screenings. This checks your fasting blood sugar level. Have the screening done:  Once every three years after age 1 if you are at a normal weight and have a low risk for diabetes.  More often and at a younger age if you are overweight or have a high risk for diabetes. What should I know about preventing infection? Hepatitis B If you have a higher risk for hepatitis B, you should be screened for this virus. Talk with your health care  provider to find out if you are at risk for hepatitis B infection. Hepatitis C Testing is recommended for:  Everyone born from [birth year] through 1964.  Anyone with known risk factors for hepatitis C. Sexually transmitted infections (STIs)  Get screened for STIs, including gonorrhea and chlamydia, if: ? You are sexually active and are younger than 73 years of age. ? You are older than 74  years of age and your health care provider tells you that you are at risk for this type of infection. ? Your sexual activity has changed since you were last screened, and you are at increased risk for chlamydia or gonorrhea. Ask your health care provider if you are at risk.  Ask your health care provider about whether you are at high risk for HIV. Your health care provider may recommend a prescription medicine to help prevent HIV infection. If you choose to take medicine to prevent HIV, you should first get tested for HIV. You should then be tested every 3 months for as long as you are taking the medicine. Pregnancy  If you are about to stop having your period (premenopausal) and you may become pregnant, seek counseling before you get pregnant.  Take 400 to 800 micrograms (mcg) of folic acid every day if you become pregnant.  Ask for birth control (contraception) if you want to prevent pregnancy. Osteoporosis and menopause Osteoporosis is a disease in which the bones lose minerals and strength with aging. This can result in bone fractures. If you are 57 years old or older, or if you are at risk for osteoporosis and fractures, ask your health care provider if you should:  Be screened for bone loss.  Take a calcium or vitamin D supplement to lower your risk of fractures.  Be given hormone replacement therapy (HRT) to treat symptoms of menopause. Follow these instructions at home: Lifestyle  Do not use any products that contain nicotine or tobacco, such as cigarettes, e-cigarettes, and chewing tobacco. If you need help quitting, ask your health care provider.  Do not use street drugs.  Do not share needles.  Ask your health care provider for help if you need support or information about quitting drugs. Alcohol use  Do not drink alcohol if: ? Your health care provider tells you not to drink. ? You are pregnant, may be pregnant, or are planning to become pregnant.  If you drink  alcohol: ? Limit how much you use to 0-1 drink a day. ? Limit intake if you are breastfeeding.  Be aware of how much alcohol is in your drink. In the [place] one drink equals one 12 oz bottle of beer (355 mL), one 5 oz glass of wine (148 mL), or one 1 oz glass of hard liquor (44 mL). General instructions  Schedule regular health, dental, and eye exams.  Stay current with your vaccines.  Tell your health care provider if: ? You often feel depressed. ? You have ever been abused or do not feel safe at home. Summary  Adopting a healthy lifestyle and getting preventive care are important in promoting health and wellness.  Follow your health care provider's instructions about healthy diet, exercising, and getting tested or screened for diseases.  Follow your health care provider's instructions on monitoring your cholesterol and blood pressure. This information is not intended to replace advice given to you by your health care provider. Make sure you discuss any questions you have with your health care provider. Document Revised: 11/28/2018 Document Reviewed: 11/28/2018 Elsevier  Patient Education  [employer].

## 2020-03-09 NOTE — Progress Notes (Signed)
Subjective:    Patient ID: [name], female    DOB: 02-01-1946, 74 y.o.   MRN: [id number]  HPI She is here for a physical exam.   She went to the ED 3/17 for chest pain.  The pain woke her up.  It did not go away.  It was left sided, sharp and worse with coughing, deep breathing.  She had some SOB with the pain - she was not able to take a deep breath.  In the emergency room EKG, CXR, labs including troponin x 1 negative.  She declined d-dimer and second troponin.  The ED physician felt it was likely   MSK in nature. It went away the next day.  She has not had any recurrence.    She does exercise on a regular basis.  She walks a couple of miles at least 5 days a week and does a fitness class twice a week and denies any chest pain, palpitations or unusual shortness of breath with those activities.  She was concerned about the atherosclerosis seen on chest x-ray.  She was wondering if she should see a cardiologist.    Medications and allergies reviewed with patient and updated if appropriate.  Patient Active Problem List   Diagnosis Date Noted  . Shingles 11/19/2019  . Pain in both knees 07/09/2019  . Elevated blood pressure reading 07/08/2019  . Right arm pain 02/26/2019  . Acute pain of left knee 08/27/2018  . 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 Trigger middle finger of right hand 06/13/2018  . Sciatica of left side 01/24/2018  . 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  . Intermittent palpitations 10/11/2017  . Intermittent chest pain 10/11/2017  . Migraine 12/30/2015  . Upper airway cough syndrome 10/29/2015  . Osteoporosis 06/06/2011  . Vitamin D deficiency 03/22/2010  . BREAST CANCER, HX OF 03/22/2010  . SPINAL STENOSIS, LUMBAR 03/25/2008  . HYPERLIPIDEMIA 09/28/2007  . Fibromyalgia 05/24/2007    Current Outpatient Medications on File Prior to Visit  Medication Sig Dispense Refill  . acetaminophen (TYLENOL) 500 MG tablet Take 1,000 mg by mouth daily as needed for mild pain.     . cholecalciferol (VITAMIN D3) 25 MCG (1000 UNIT) tablet Take 1,000 Units by mouth daily.    Marland Kitchen eletriptan (RELPAX) 20 MG tablet TAKE 1 TABLET BY MOUTH EVERY 2 HOURS AS NEEDED FOR HEADACHE OR MIGRAINE. MAY REPEAT IN 2 HOURS IF HEADACHE PERSISTS OR RECURS (Patient taking differently: Take 20 mg by mouth See admin instructions. Every 2 hours as needed for headache or migraine. May repeat in 2 hours if headache persists.) 9 tablet 1  . MAGNESIUM PO Take 1 tablet by mouth daily.    . traZODone (DESYREL) 50 MG tablet Take 3 tablets (150 mg total) by mouth at bedtime. 90 tablet 1   No current facility-administered medications on file prior to visit.    Past Medical History:  Diagnosis Date  . Arthritis   . Breast cancer ([place])    R lumpectomy ; radiation; no chemotherapy  . Bronchitis    recurrent  . Cataract    multifocal lens implant  . Chronic low back pain    bulging disc & spinal stenosis  . Fibromyalgia   . Hyperlipemia   . Mitral valve prolapse   . Osteoporosis   . Personal history of radiation therapy   . Pneumonia    as OP X 2; no Pneumovax  . Vitamin D deficiency    18 in  01/2009  Past Surgical History:  Procedure Laterality Date  . ABDOMINAL HYSTERECTOMY  1990   [facility] for fibroid , Dr Maryruth Eve  . BREAST LUMPECTOMY Right   . BREAST SURGERY  2010   R side lumpectomy; Dr Margot Chimes  . CATARACT EXTRACTION, BILATERAL  2017  . CESAREAN SECTION      G 2 P 2  . CYSTECTOMY  2010  . EYE SURGERY     eye lid surgery  . no colonoscopy     "I just don't want to do it". SOC reviewed    Social History   Socioeconomic History  . Marital status: Married    Spouse name: Not on file  . Number of children: Not on file  . Years of education: Not on file  . Highest education level: Not on file  Occupational History  . Not on file  Tobacco Use  . Smoking status: Former Smoker    Quit date: 12/19/1970    Years since quitting: 49.2  . Smokeless tobacco: Never Used  . Tobacco  comment: [id number], up to 3 cigarettes / day  Substance and Sexual Activity  . Alcohol use: No  . Drug use: No  . Sexual activity: Not on file  Other Topics Concern  . Not on file  Social History Narrative  . Not on file   Social Determinants of Health   Financial Resource Strain:   . Difficulty of Paying Living Expenses:   Food Insecurity:   . Worried About Charity fundraiser in the Last Year:   . Arboriculturist in the Last Year:   Transportation Needs:   . Film/video editor (Medical):   Marland Kitchen Lack of Transportation (Non-Medical):   Physical Activity:   . Days of Exercise per Week:   . Minutes of Exercise per Session:   Stress:   . Feeling of Stress :   Social Connections:   . Frequency of Communication with Friends and Family:   . Frequency of Social Gatherings with Friends and Family:   . Attends Religious Services:   . Active Member of Clubs or Organizations:   . Attends Archivist Meetings:   Marland Kitchen Marital Status:     Family History  Problem Relation Age of Onset  . Atrial fibrillation Mother        S/P cardioversion  . Ulcers Mother   . Hypertension Mother   . Dementia Mother   . Heart attack Father 60       died @ 40  . Heart attack Paternal Aunt        < 60  . Heart attack Paternal Grandfather        > 55  . Heart attack Paternal Uncle        > 55  . Heart attack Paternal Grandmother        < 66  . Hypertension Brother   . Thyroid disease Daughter        on Synthroid since high school  . Diabetes Brother   . Stroke Neg Hx   . Breast cancer Neg Hx     Review of Systems  Constitutional: Negative for chills and fever.  HENT: Positive for postnasal drip and sore throat.   Eyes: Negative for visual disturbance.  Respiratory: Positive for cough (drainage related). Negative for shortness of breath and wheezing.   Cardiovascular: Positive for chest pain. Negative for palpitations (feeling of heart turning over - chornic, no change) and leg  swelling.  Gastrointestinal: Negative  for abdominal pain, blood in stool, constipation, diarrhea and nausea.       No gerd  Genitourinary: Negative for dysuria and hematuria.  Musculoskeletal: Positive for arthralgias and back pain (spinal stenosis).  Skin: Negative for color change and rash.  Neurological: Negative for dizziness, light-headedness and headaches.  Psychiatric/Behavioral: Negative for dysphoric mood. The patient is nervous/anxious.        Objective:   Vitals:   03/10/20 0959  BP: 120/76  Pulse: 70  Resp: 16  Temp: 98.9 F (37.2 C)  SpO2: 98%   There were no vitals filed for this visit. Body mass index is 21.13 kg/m.  BP Readings from Last 3 Encounters:  03/10/20 120/76  03/04/20 (!) 161/72  11/19/19 (!) 142/88    Wt Readings from Last 3 Encounters:  03/04/20 127 lb (57.6 kg)  05/28/19 127 lb (57.6 kg)  04/30/19 127 lb (57.6 kg)     Physical Exam Constitutional: She appears well-developed and well-nourished. No distress.  HENT:  Head: Normocephalic and atraumatic.  Right Ear: External ear normal. Normal ear canal and TM Left Ear: External ear normal.  Normal ear canal and TM Mouth/Throat: Oropharynx is clear and moist.  Eyes: Conjunctivae and EOM are normal.  Neck: Neck supple. No tracheal deviation present. No thyromegaly present.  No carotid bruit  Cardiovascular: Normal rate, regular rhythm and normal heart sounds.   No murmur heard.  No edema. Pulmonary/Chest: Effort normal and breath sounds normal. No respiratory distress. She has no wheezes. She has no rales.  Breast: deferred   Abdominal: Soft. She exhibits no distension. There is no tenderness.  Lymphadenopathy: She has no cervical adenopathy.  Skin: Skin is warm and dry. She is not diaphoretic.  Psychiatric: She has a normal mood and affect. Her behavior is normal.        Assessment & Plan:   Physical exam: Screening blood work    ordered Immunizations  Had covid; declined  prevnar, td Colonoscopy  - declined colonoscopy/ cologuard Mammogram  Due - will schedule Gyn  N/a  Dexa  Up to date  Eye exams  Up to date  Exercise  Fitness class 2/week,  Walks 2 miles 5 days a week Weight  Normal BMI Substance abuse    none  See Problem List for Assessment and Plan of chronic medical problems.      This visit occurred during the SARS-CoV-2 public health emergency.  Safety protocols were in place, including screening questions prior to the visit, additional usage of staff PPE, and extensive cleaning of exam room while observing appropriate contact time as indicated for disinfecting solutions.

## 2020-03-10 ENCOUNTER — Other Ambulatory Visit: Payer: Self-pay

## 2020-03-10 ENCOUNTER — Encounter: Payer: Self-pay | Admitting: Internal Medicine

## 2020-03-10 ENCOUNTER — Ambulatory Visit (INDEPENDENT_AMBULATORY_CARE_PROVIDER_SITE_OTHER): Payer: BC Managed Care – PPO | Admitting: Internal Medicine

## 2020-03-10 VITALS — BP 120/76 | HR 70 | Temp 98.9°F | Resp 16 | Ht 65.0 in

## 2020-03-10 DIAGNOSIS — M816 Localized osteoporosis [Lequesne]: Secondary | ICD-10-CM

## 2020-03-10 DIAGNOSIS — E559 Vitamin D deficiency, unspecified: Secondary | ICD-10-CM | POA: Diagnosis not present

## 2020-03-10 DIAGNOSIS — G43909 Migraine, unspecified, not intractable, without status migrainosus: Secondary | ICD-10-CM | POA: Diagnosis not present

## 2020-03-10 DIAGNOSIS — Z0000 Encounter for general adult medical examination without abnormal findings: Secondary | ICD-10-CM | POA: Diagnosis not present

## 2020-03-10 DIAGNOSIS — R0789 Other chest pain: Secondary | ICD-10-CM | POA: Insufficient documentation

## 2020-03-10 DIAGNOSIS — E782 Mixed hyperlipidemia: Secondary | ICD-10-CM

## 2020-03-10 MED ORDER — ROSUVASTATIN CALCIUM 5 MG PO TABS: 5.0000 mg | ORAL_TABLET | ORAL | 1 refills | Status: DC

## 2020-03-10 NOTE — Assessment & Plan Note (Signed)
Chronic Lipids not controlled-stable Currently diet controlled Regular exercise and healthy diet encouraged She does have an elevated ASCVD risk and atherosclerosis on recent chest x-ray Advised that she should be on a cholesterol-lowering medication-after discussion she agrees to try Crestor 5 mg twice a week Recheck lipids, hepatic function in 6 weeks

## 2020-03-10 NOTE — Assessment & Plan Note (Signed)
Chronic Taking her vitamin D on a daily basis Recent vitamin D level is good-around 60 Continue daily vitamin D

## 2020-03-10 NOTE — Assessment & Plan Note (Signed)
Recent episode of atypical chest pain for which she went to the ED Work-up was negative and her chest pain started more muscular skeletal in nature She has not had a recurrence She is exercising regularly without any concerning symptoms We will start Crestor 5 mg twice a week for high lipids, evidence of atherosclerosis on imaging Discussed cardiology referral-I do not think at this point she needs further evaluation, but advised her to let me know if she would like to see cardiology and will be more than happy to refer

## 2020-03-10 NOTE — Assessment & Plan Note (Signed)
Chronic DEXA up-to-date Exercising regularly Taking vitamin D

## 2020-03-10 NOTE — Assessment & Plan Note (Signed)
Chronic Very infrequent migraines Takes Relpax as needed, which is effective

## 2020-03-11 ENCOUNTER — Telehealth: Payer: Self-pay

## 2020-03-11 MED ORDER — TRAZODONE HCL 50 MG PO TABS: 150.0000 mg | ORAL_TABLET | Freq: Every day | ORAL | 1 refills | Status: DC

## 2020-03-11 NOTE — Telephone Encounter (Signed)
Rx sent 

## 2020-03-11 NOTE — Telephone Encounter (Signed)
1.Medication Requested:traZODone (DESYREL) 50 MG tablet  2. Pharmacy (Name, Street, City):[facility] [place], [address]  3. On Med List: Yes   4. Last Visit with PCP: 3.23.21   5. Next visit date with PCP:   Agent: Please be advised that RX refills may take up to 3 business days. We ask that you follow-up with your pharmacy.

## 2020-04-13 ENCOUNTER — Other Ambulatory Visit: Payer: Self-pay | Admitting: Internal Medicine

## 2020-04-15 ENCOUNTER — Other Ambulatory Visit: Payer: BC Managed Care – PPO

## 2020-04-17 ENCOUNTER — Other Ambulatory Visit: Payer: Self-pay

## 2020-04-17 ENCOUNTER — Encounter: Payer: Self-pay | Admitting: Internal Medicine

## 2020-04-17 ENCOUNTER — Other Ambulatory Visit (INDEPENDENT_AMBULATORY_CARE_PROVIDER_SITE_OTHER): Payer: BC Managed Care – PPO

## 2020-04-17 DIAGNOSIS — E782 Mixed hyperlipidemia: Secondary | ICD-10-CM | POA: Diagnosis not present

## 2020-04-17 LAB — HEPATIC FUNCTION PANEL
ALT: 19 U/L (ref 0–35)
AST: 18 U/L (ref 0–37)
Albumin: 4.6 g/dL (ref 3.5–5.2)
Alkaline Phosphatase: 63 U/L (ref 39–117)
Bilirubin, Direct: 0.1 mg/dL (ref 0.0–0.3)
Total Bilirubin: 0.7 mg/dL (ref 0.2–1.2)
Total Protein: 7.2 g/dL (ref 6.0–8.3)

## 2020-04-17 LAB — LIPID PANEL
Cholesterol: 208 mg/dL — ABNORMAL HIGH (ref 0–200)
HDL: 71.1 mg/dL (ref >39.00)
LDL Cholesterol: 123 mg/dL — ABNORMAL HIGH (ref 0–99)
NonHDL: 136.49
Total CHOL/HDL Ratio: 3
Triglycerides: 69 mg/dL (ref 0.0–149.0)
VLDL: 13.8 mg/dL (ref 0.0–40.0)

## 2020-05-13 ENCOUNTER — Other Ambulatory Visit: Payer: Self-pay | Admitting: Internal Medicine

## 2020-07-10 ENCOUNTER — Other Ambulatory Visit: Payer: Self-pay | Admitting: Internal Medicine

## 2020-07-13 ENCOUNTER — Telehealth: Payer: Self-pay | Admitting: Internal Medicine

## 2020-07-13 MED ORDER — TRAZODONE HCL 50 MG PO TABS: ORAL_TABLET | ORAL | 1 refills | Status: DC

## 2020-07-13 NOTE — Telephone Encounter (Signed)
New message:   Pt is calling and states her refill for traZODone (DESYREL) 50 MG tablet  Is supposed to go to this pharmacy and not the one in [place].  [id number] - [place], [address]. Please advise.

## 2020-08-03 ENCOUNTER — Other Ambulatory Visit: Payer: Self-pay | Admitting: Internal Medicine

## 2020-08-03 DIAGNOSIS — Z1231 Encounter for screening mammogram for malignant neoplasm of breast: Secondary | ICD-10-CM

## 2020-08-04 NOTE — Progress Notes (Signed)
Subjective:    Patient ID: [name], female    DOB: 01-Jan-1946, 74 y.o.   MRN: [id number]  HPI The patient is here for an acute visit.   Back pain: it is across the lower back and down her left leg to her foot.  Her hips hurt b/l deep inside.  She is not walking right.  Her ribs hurt, her arms hurt and she feels foggy.  She has had back issues for 30 years - these other symptoms have been present for couple of weeks.  Her chronic back pain is just worse,   She has had a lot of stress recently.   Years ago she was told she had spinal stenosis.  She has not seen anyone recently for her back.   She takes tylenol ES and it has helped. She does not tolerate nsaids and does not want to take anything stronger.      Medications and allergies reviewed with patient and updated if appropriate.  Patient Active Problem List   Diagnosis Date Noted  . Lumbar back pain with radiculopathy affecting left lower extremity 08/05/2020  . Atypical chest pain 03/10/2020  . Shingles 11/19/2019  . Pain in both knees 07/09/2019  . Right arm pain 02/26/2019  . Acute pain of left knee 08/27/2018  . 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 Trigger middle finger of right hand 06/13/2018  . Sciatica of left side 01/24/2018  . 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  . Intermittent palpitations 10/11/2017  . Migraine 12/30/2015  . Upper airway cough syndrome 10/29/2015  . Osteoporosis 06/06/2011  . Vitamin D deficiency 03/22/2010  . BREAST CANCER, HX OF 03/22/2010  . SPINAL STENOSIS, LUMBAR 03/25/2008  . HYPERLIPIDEMIA 09/28/2007  . Fibromyalgia 05/24/2007    Current Outpatient Medications on File Prior to Visit  Medication Sig Dispense Refill  . acetaminophen (TYLENOL) 500 MG tablet Take 1,000 mg by mouth daily as needed for mild pain.    . cholecalciferol (VITAMIN D3) 25 MCG (1000 UNIT) tablet Take 1,000 Units by mouth daily.    Marland Kitchen eletriptan (RELPAX) 20 MG tablet TAKE 1 TABLET BY MOUTH EVERY 2 HOURS AS  NEEDED FOR HEADACHE OR MIGRAINE. MAY REPEAT IN 2 HOURS IF HEADACHE PERSISTS OR RECURS (Patient taking differently: Take 20 mg by mouth See admin instructions. Every 2 hours as needed for headache or migraine. May repeat in 2 hours if headache persists.) 9 tablet 1  . MAGNESIUM PO Take 1 tablet by mouth daily.    . rosuvastatin (CRESTOR) 5 MG tablet TAKE 1 TABLET(5 MG) BY MOUTH 2 TIMES A WEEK 24 tablet 1  . traZODone (DESYREL) 50 MG tablet TAKE 3 TABLETS(150 MG) BY MOUTH AT BEDTIME 90 tablet 1   No current facility-administered medications on file prior to visit.    Past Medical History:  Diagnosis Date  . Arthritis   . Breast cancer ([place])    R lumpectomy ; radiation; no chemotherapy  . Bronchitis    recurrent  . Cataract    multifocal lens implant  . Chronic low back pain    bulging disc & spinal stenosis  . Fibromyalgia   . Hyperlipemia   . Mitral valve prolapse   . Osteoporosis   . Personal history of radiation therapy   . Pneumonia    as OP X 2; no Pneumovax  . Vitamin D deficiency    18 in  01/2009    Past Surgical History:  Procedure Laterality Date  . ABDOMINAL HYSTERECTOMY  1990  [facility] for fibroid , Dr Maryruth Eve  . BREAST LUMPECTOMY Right   . BREAST SURGERY  2010   R side lumpectomy; Dr Margot Chimes  . CATARACT EXTRACTION, BILATERAL  2017  . CESAREAN SECTION      G 2 P 2  . CYSTECTOMY  2010  . EYE SURGERY     eye lid surgery  . no colonoscopy     "I just don't want to do it". SOC reviewed    Social History   Socioeconomic History  . Marital status: Married    Spouse name: Not on file  . Number of children: Not on file  . Years of education: Not on file  . Highest education level: Not on file  Occupational History  . Not on file  Tobacco Use  . Smoking status: Former Smoker    Quit date: 12/19/1970    Years since quitting: 49.6  . Smokeless tobacco: Never Used  . Tobacco comment: [id number], up to 3 cigarettes / day  Substance and Sexual Activity  . Alcohol  use: No  . Drug use: No  . Sexual activity: Not on file  Other Topics Concern  . Not on file  Social History Narrative  . Not on file   Social Determinants of Health   Financial Resource Strain:   . Difficulty of Paying Living Expenses:   Food Insecurity:   . Worried About Charity fundraiser in the Last Year:   . Arboriculturist in the Last Year:   Transportation Needs:   . Film/video editor (Medical):   Marland Kitchen Lack of Transportation (Non-Medical):   Physical Activity:   . Days of Exercise per Week:   . Minutes of Exercise per Session:   Stress:   . Feeling of Stress :   Social Connections:   . Frequency of Communication with Friends and Family:   . Frequency of Social Gatherings with Friends and Family:   . Attends Religious Services:   . Active Member of Clubs or Organizations:   . Attends Archivist Meetings:   Marland Kitchen Marital Status:     Family History  Problem Relation Age of Onset  . Atrial fibrillation Mother        S/P cardioversion  . Ulcers Mother   . Hypertension Mother   . Dementia Mother   . Heart attack Father 1       died @ 13  . Heart attack Paternal Aunt        < 83  . Heart attack Paternal Grandfather        > 55  . Heart attack Paternal Uncle        > 55  . Heart attack Paternal Grandmother        < 68  . Hypertension Brother   . Thyroid disease Daughter        on Synthroid since high school  . Diabetes Brother   . Stroke Neg Hx   . Breast cancer Neg Hx     Review of Systems  Constitutional: Positive for fatigue. Negative for appetite change and fever.  Eyes: Negative for visual disturbance.  Respiratory: Negative for cough, shortness of breath and wheezing.   Cardiovascular: Negative for chest pain, palpitations and leg swelling.  Musculoskeletal: Positive for back pain (acute on chronic, with left leg pain).       Arms pain is muscle pain and skin feels like it is crawling.  Ribs hurt.  Hips hurt - hard to  get up from a chair    Neurological: Negative for dizziness, numbness and headaches.       Fogginess  Psychiatric/Behavioral: Positive for sleep disturbance.       Objective:   Vitals:   08/05/20 1426  BP: 128/72  Pulse: 73  Temp: 97.9 F (36.6 C)  SpO2: 98%   BP Readings from Last 3 Encounters:  08/05/20 128/72  03/10/20 120/76  03/04/20 (!) 161/72   Wt Readings from Last 3 Encounters:  03/04/20 127 lb (57.6 kg)  05/28/19 127 lb (57.6 kg)  04/30/19 127 lb (57.6 kg)   Body mass index is 21.13 kg/m.   Physical Exam Constitutional:      General: She is not in acute distress.    Appearance: Normal appearance. She is not ill-appearing.  HENT:     Head: Normocephalic and atraumatic.  Musculoskeletal:     Comments: No lumbar spine tenderness.  Tenderness with palpation of muscles in back, b/l ribs, upper arms and to a lesser degree upper legs  Skin:    General: Skin is warm and dry.  Neurological:     Mental Status: She is alert.     Sensory: No sensory deficit.     Motor: No weakness.     Gait: Gait abnormal (slight limp).            Assessment & Plan:    See Problem List for Assessment and Plan of chronic medical problems.    This visit occurred during the SARS-CoV-2 public health emergency.  Safety protocols were in place, including screening questions prior to the visit, additional usage of staff PPE, and extensive cleaning of exam room while observing appropriate contact time as indicated for disinfecting solutions.

## 2020-08-05 ENCOUNTER — Encounter: Payer: Self-pay | Admitting: Internal Medicine

## 2020-08-05 ENCOUNTER — Other Ambulatory Visit: Payer: Self-pay

## 2020-08-05 ENCOUNTER — Ambulatory Visit (INDEPENDENT_AMBULATORY_CARE_PROVIDER_SITE_OTHER): Payer: BC Managed Care – PPO | Admitting: Internal Medicine

## 2020-08-05 VITALS — BP 128/72 | HR 73 | Temp 97.9°F | Ht 65.0 in

## 2020-08-05 DIAGNOSIS — M791 Myalgia, unspecified site: Secondary | ICD-10-CM | POA: Diagnosis not present

## 2020-08-05 DIAGNOSIS — M48062 Spinal stenosis, lumbar region with neurogenic claudication: Secondary | ICD-10-CM | POA: Insufficient documentation

## 2020-08-05 DIAGNOSIS — M5416 Radiculopathy, lumbar region: Secondary | ICD-10-CM | POA: Diagnosis not present

## 2020-08-05 DIAGNOSIS — M797 Fibromyalgia: Secondary | ICD-10-CM

## 2020-08-05 MED ORDER — CYCLOBENZAPRINE HCL 5 MG PO TABS: 5.0000 mg | ORAL_TABLET | Freq: Three times a day (TID) | ORAL | 1 refills | Status: DC | PRN

## 2020-08-05 NOTE — Assessment & Plan Note (Signed)
Acute on chronic Has chronic pain but pain is worse. Tylenol helps and she does not tolerate anything stronger Continue tylenol up to 3000 mg/ day Flexeril at night Does not tolerate gabapentin and we both agree to avoid steroids Call if no improvement

## 2020-08-05 NOTE — Patient Instructions (Addendum)
You can take up to 3000 mg of tylenol a day safely.    Hold crestor.    Try 1/2- 1 pill of the muscle relaxer at night.      Have blood work done.

## 2020-08-05 NOTE — Assessment & Plan Note (Signed)
Acute on chronic - likely fibro flare Start flexeril 2.5 mg - 5 mg at night Tylenol up to 3000 mg/ day Call if no improvement

## 2020-08-07 ENCOUNTER — Other Ambulatory Visit: Payer: Self-pay

## 2020-08-07 ENCOUNTER — Ambulatory Visit
Admission: RE | Admit: 2020-08-07 | Discharge: 2020-08-07 | Disposition: A | Discharge status: Home / Self Care | Payer: BC Managed Care – PPO | Source: Ambulatory Visit | Attending: Internal Medicine | Admitting: Internal Medicine

## 2020-08-07 DIAGNOSIS — Z1231 Encounter for screening mammogram for malignant neoplasm of breast: Secondary | ICD-10-CM

## 2020-08-07 LAB — CBC WITH DIFFERENTIAL/PLATELET
Absolute Monocytes: 451 cells/uL (ref 200–950)
Basophils Absolute: 43 cells/uL (ref 0–200)
Basophils Relative: 0.7 %
Eosinophils Absolute: 61 cells/uL (ref 15–500)
Eosinophils Relative: 1 %
HCT: 38.3 % (ref 35.0–45.0)
Hemoglobin: 12.7 g/dL (ref 11.7–15.5)
Lymphs Abs: 1330 cells/uL (ref 850–3900)
MCH: 30.9 pg (ref 27.0–33.0)
MCHC: 33.2 g/dL (ref 32.0–36.0)
MCV: 93.2 fL (ref 80.0–100.0)
MPV: 10.2 fL (ref 7.5–12.5)
Monocytes Relative: 7.4 %
Neutro Abs: 4215 cells/uL (ref 1500–7800)
Neutrophils Relative %: 69.1 %
Platelets: 268 10*3/uL (ref 140–400)
RBC: 4.11 10*6/uL (ref 3.80–5.10)
RDW: 12.4 % (ref 11.0–15.0)
Total Lymphocyte: 21.8 %
WBC: 6.1 10*3/uL (ref 3.8–10.8)

## 2020-08-07 LAB — ANTI-NUCLEAR AB-TITER (ANA TITER): ANA Titer 1: 1:640 {titer} — ABNORMAL HIGH

## 2020-08-07 LAB — COMPLETE METABOLIC PANEL WITH GFR
AG Ratio: 1.8 (calc) (ref 1.0–2.5)
ALT: 15 U/L (ref 6–29)
AST: 17 U/L (ref 10–35)
Albumin: 4.6 g/dL (ref 3.6–5.1)
Alkaline phosphatase (APISO): 61 U/L (ref 37–153)
BUN/Creatinine Ratio: 18 (calc) (ref 6–22)
BUN: 18 mg/dL (ref 7–25)
CO2: 25 mmol/L (ref 20–32)
Calcium: 9.5 mg/dL (ref 8.6–10.4)
Chloride: 104 mmol/L (ref 98–110)
Creat: 1.01 mg/dL — ABNORMAL HIGH (ref 0.60–0.93)
GFR, Est African American: 64 mL/min/{1.73_m2} (ref >=60)
GFR, Est Non African American: 55 mL/min/{1.73_m2} — ABNORMAL LOW (ref >=60)
Globulin: 2.5 g/dL (calc) (ref 1.9–3.7)
Glucose, Bld: 85 mg/dL (ref 65–99)
Potassium: 4.6 mmol/L (ref 3.5–5.3)
Sodium: 138 mmol/L (ref 135–146)
Total Bilirubin: 0.5 mg/dL (ref 0.2–1.2)
Total Protein: 7.1 g/dL (ref 6.1–8.1)

## 2020-08-07 LAB — CK: Total CK: 91 U/L (ref 29–143)

## 2020-08-07 LAB — C-REACTIVE PROTEIN: CRP: 2.7 mg/L (ref <8.0)

## 2020-08-07 LAB — ANA: Anti Nuclear Antibody (ANA): POSITIVE — AB

## 2020-08-07 LAB — SEDIMENTATION RATE: Sed Rate: 6 mm/h (ref 0–30)

## 2020-08-09 ENCOUNTER — Encounter: Payer: Self-pay | Admitting: Internal Medicine

## 2020-08-09 DIAGNOSIS — M2550 Pain in unspecified joint: Secondary | ICD-10-CM

## 2020-08-09 DIAGNOSIS — R768 Other specified abnormal immunological findings in serum: Secondary | ICD-10-CM

## 2020-08-11 NOTE — Addendum Note (Signed)
Addended by: Binnie Rail on: 08/11/2020 05:31 AM   Modules accepted: Orders

## 2020-08-19 DIAGNOSIS — H16223 Keratoconjunctivitis sicca, not specified as Sjogren's, bilateral: Secondary | ICD-10-CM | POA: Diagnosis not present

## 2020-08-19 DIAGNOSIS — H40013 Open angle with borderline findings, low risk, bilateral: Secondary | ICD-10-CM | POA: Diagnosis not present

## 2020-09-14 ENCOUNTER — Encounter: Payer: Self-pay | Admitting: Internal Medicine

## 2020-09-14 DIAGNOSIS — M549 Dorsalgia, unspecified: Secondary | ICD-10-CM

## 2020-09-16 MED ORDER — METHOCARBAMOL 500 MG PO TABS: 500.0000 mg | ORAL_TABLET | Freq: Four times a day (QID) | ORAL | 2 refills | Status: DC | PRN

## 2020-09-16 NOTE — Addendum Note (Signed)
Addended by: Binnie Rail on: 09/16/2020 05:42 AM   Modules accepted: Orders

## 2020-09-16 NOTE — Addendum Note (Signed)
Addended by: Binnie Rail on: 09/16/2020 09:01 PM   Modules accepted: Orders

## 2020-10-17 ENCOUNTER — Ambulatory Visit: Payer: BC Managed Care – PPO | Attending: Internal Medicine

## 2020-10-17 DIAGNOSIS — Z23 Encounter for immunization: Secondary | ICD-10-CM

## 2020-10-17 NOTE — Progress Notes (Signed)
° °  Covid-19 Vaccination Clinic  Name:  [name]    MRN: [id number] DOB: [birth year]  10/17/2020  [name] was observed post Covid-19 immunization for 15 minutes without incident. She was provided with Vaccine Information Sheet and instruction to access the V-Safe system.   [name] was instructed to call 911 with any severe reactions post vaccine:  Difficulty breathing   Swelling of face and throat   A fast heartbeat   A bad rash all over body   Dizziness and weakness

## 2020-11-03 ENCOUNTER — Ambulatory Visit: Payer: Self-pay

## 2020-11-03 ENCOUNTER — Encounter: Payer: Self-pay | Admitting: Orthopaedic Surgery

## 2020-11-03 ENCOUNTER — Ambulatory Visit (INDEPENDENT_AMBULATORY_CARE_PROVIDER_SITE_OTHER): Payer: BC Managed Care – PPO | Admitting: Orthopaedic Surgery

## 2020-11-03 VITALS — BP 157/93 | HR 67 | Ht 65.0 in | Wt 128.0 lb

## 2020-11-03 DIAGNOSIS — M545 Low back pain, unspecified: Secondary | ICD-10-CM | POA: Diagnosis not present

## 2020-11-03 DIAGNOSIS — M65331 Trigger finger, right middle finger: Secondary | ICD-10-CM

## 2020-11-03 DIAGNOSIS — G8929 Other chronic pain: Secondary | ICD-10-CM | POA: Diagnosis not present

## 2020-11-03 NOTE — Progress Notes (Signed)
Office Visit Note   Patient: [name]           Date of Birth: [birth year]           MRN: [id number] Visit Date: 11/03/2020              Requested by: Binnie Rail, MD [place],  [place] [id number] PCP: Binnie Rail, MD   Assessment & Plan: Visit Diagnoses:  1. Chronic bilateral low back pain, unspecified whether sciatica present   2. Trigger middle finger of right hand     Plan: Patient can return if the right long finger gets worse we discussed possible injection of the flexor tendon sheath further triggering if it increases.  We discussed MRI scan lumbar spine.  She states primarily she has been dealing with this for many years.  She is not really sure it necessarily progressing or getting worse.  Most times she can walk 2 miles a day without problems.  She may have some intermittent bulging of the disc or some moderate stenosis with mild bulging of the disc with recurrent symptoms.  She will call if she feels like she needs prednisone Dosepak when she has a severe flare or can call back if she like to proceed with MRI scan for reevaluation of her previous diagnosis of spinal stenosis.  We did discuss possible lumbar decompression if she did have significant stenosis.  Follow-Up Instructions: No follow-ups on file.   Orders:  Orders Placed This Encounter  Procedures  . XR Lumbar Spine 2-3 Views   No orders of the defined types were placed in this encounter.     Procedures: No procedures performed   Clinical Data: No additional findings.   Subjective: Chief Complaint  Patient presents with  . Lower Back - Pain    HPI 74 year old female with previous scan of her back about 20 years ago and she was told she had spinal stenosis.  States she not getting better and she tends to have 4-6 times a year were she has significant back pain and only can make it from the bedroom to the kitchen.  This tends to last for up to a week sometimes 2 and at other  times she states she walks 2 miles daily without problems.  She is also had some problems with her right hand with catching or triggering of the long finger and points to the A2 pulley region where she feels the pain.  Reaching in her purse for coin she is modified to just use the thumb and index finger.  Is not locking on her when she wakes up.  Review of Systems no bowel bladder symptoms no fever chills.  History of breast cancer.   Objective: Vital Signs: BP (!) 157/93   Pulse 67   Ht 5\' 5"  (1.651 m)   Wt 128 lb (58.1 kg)   BMI 21.30 kg/m   Physical Exam Constitutional:      Appearance: She is well-developed.  HENT:     Head: Normocephalic.     Right Ear: External ear normal.     Left Ear: External ear normal.  Eyes:     Pupils: Pupils are equal, round, and reactive to light.  Neck:     Thyroid: No thyromegaly.     Trachea: No tracheal deviation.  Cardiovascular:     Rate and Rhythm: Normal rate.  Pulmonary:     Effort: Pulmonary effort is normal.  Abdominal:  Palpations: Abdomen is soft.  Skin:    General: Skin is warm and dry.  Neurological:     Mental Status: She is alert and oriented to person, place, and time.  Psychiatric:        Behavior: Behavior normal.     Ortho Exam patient will walk on her toes walk on her heels has 1+ knee and ankle jerk.  Negative logroll the hips negative straight leg raising 90 degrees no trochanteric bursal tenderness.  Right long finger A1 pulley is normal.  With flexion extension she has palpable nodule underneath A2 pulley which is tender.  She is not actively locking today.  Specialty Comments:  No specialty comments available.  Imaging: XR Lumbar Spine 2-3 Views  Result Date: 11/03/2020 AP lateral lumbar spine x-rays obtained and reviewed.  There is some spurring and narrowing at L4-5 more than L3-4.  Facet arthropathy is noted.  No listhesis.  Negative for compression fractures. Impression: Lumbar disc degeneration L4-5  and to lesser degree L3-4.    PMFS History: Patient Active Problem List   Diagnosis Date Noted  . Lumbar back pain with radiculopathy affecting left lower extremity 08/05/2020  . Atypical chest pain 03/10/2020  . Shingles 11/19/2019  . Pain in both knees 07/09/2019  . Right arm pain 02/26/2019  . Acute pain of left knee 08/27/2018  . 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 Trigger middle finger of right hand 06/13/2018  . Sciatica of left side 01/24/2018  . 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  . Intermittent palpitations 10/11/2017  . Migraine 12/30/2015  . Upper airway cough syndrome 10/29/2015  . Osteoporosis 06/06/2011  . Vitamin D deficiency 03/22/2010  . BREAST CANCER, HX OF 03/22/2010  . SPINAL STENOSIS, LUMBAR 03/25/2008  . HYPERLIPIDEMIA 09/28/2007  . Fibromyalgia 05/24/2007   Past Medical History:  Diagnosis Date  . Arthritis   . Breast cancer ([facility])    R lumpectomy ; radiation; no chemotherapy  . Bronchitis    recurrent  . Cataract    multifocal lens implant  . Chronic low back pain    bulging disc & spinal stenosis  . Fibromyalgia   . Hyperlipemia   . Mitral valve prolapse   . Osteoporosis   . Personal history of radiation therapy   . Pneumonia    as OP X 2; no Pneumovax  . Vitamin D deficiency    18 in  01/2009    Family History  Problem Relation Age of Onset  . Atrial fibrillation Mother        S/P cardioversion  . Ulcers Mother   . Hypertension Mother   . Dementia Mother   . Heart attack Father 50       died @ 28  . Heart attack Paternal Aunt        < 53  . Heart attack Paternal Grandfather        > 55  . Heart attack Paternal Uncle        > 55  . Heart attack Paternal Grandmother        < 51  . Hypertension Brother   . Thyroid disease Daughter        on Synthroid since high school  . Diabetes Brother   . Stroke Neg Hx   . Breast cancer Neg Hx     Past Surgical History:  Procedure Laterality Date  . ABDOMINAL HYSTERECTOMY  1990   USO for  fibroid , Dr Maryruth Eve  . BREAST LUMPECTOMY Right   . BREAST  SURGERY  2010   R side lumpectomy; Dr Margot Chimes  . CATARACT EXTRACTION, BILATERAL  2017  . CESAREAN SECTION      G 2 P 2  . CYSTECTOMY  2010  . EYE SURGERY     eye lid surgery  . no colonoscopy     "I just don't want to do it". SOC reviewed   Social History   Occupational History  . Not on file  Tobacco Use  . Smoking status: Former Smoker    Quit date: 12/19/1970    Years since quitting: 49.9  . Smokeless tobacco: Never Used  . Tobacco comment: [id number], up to 3 cigarettes / day  Substance and Sexual Activity  . Alcohol use: No  . Drug use: No  . Sexual activity: Not on file

## 2020-11-11 ENCOUNTER — Other Ambulatory Visit: Payer: Self-pay | Admitting: Internal Medicine

## 2020-11-13 ENCOUNTER — Encounter: Payer: Self-pay | Admitting: Internal Medicine

## 2020-11-13 MED ORDER — AMLODIPINE BESYLATE 2.5 MG PO TABS: 2.5000 mg | ORAL_TABLET | Freq: Every day | ORAL | 5 refills | Status: DC

## 2020-11-27 DIAGNOSIS — H40013 Open angle with borderline findings, low risk, bilateral: Secondary | ICD-10-CM | POA: Diagnosis not present

## 2020-12-02 MED ORDER — LISINOPRIL 5 MG PO TABS: 5.0000 mg | ORAL_TABLET | Freq: Every day | ORAL | 1 refills | Status: DC

## 2020-12-02 MED ORDER — AMLODIPINE BESYLATE 2.5 MG PO TABS: 5.0000 mg | ORAL_TABLET | Freq: Every day | ORAL | 5 refills | Status: DC

## 2020-12-02 NOTE — Addendum Note (Signed)
Addended by: Binnie Rail on: 12/02/2020 06:01 AM   Modules accepted: Orders

## 2020-12-20 NOTE — Progress Notes (Signed)
Office Visit Note  Patient: [name]             Date of Birth: 1946-06-05           MRN: [id number]             PCP: Pincus Sanes, MD Referring: Pincus Sanes, MD Visit Date: 12/28/2020 Occupation: @GUAROCC @  Subjective:  Pain in multiple joints.   History of Present Illness: [name] is a 75 y.o. female seen in consultation per request of her PCP for positive ANA.  According to the patient she has had generalized pain for many years.  She was diagnosed with fibromyalgia syndrome more than 20 years ago.  She has had lower back pain for several years.  She states she has been under care of Dr. 66 for degenerative disc disease of lumbar spine.  She states recently the pain has been more intense and moves around.  In the last few months the pain has been concentrated more on the her right side.  She describes pain all the way from her neck to her toes.  Pain is in the muscles and joints.  She denies any history of oral ulcers, nasal ulcers, malar rash, photosensitivity, Raynaud's phenomenon or lymphadenopathy.  She gives history of dry eyes.  There is no family history of autoimmune disease. She is gravida 2, para 2.  Activities of Daily Living:  Patient reports morning stiffness for all day.   Patient Denies nocturnal pain.  Difficulty dressing/grooming: Reports Difficulty climbing stairs: Denies Difficulty getting out of chair: Reports Difficulty using hands for taps, buttons, cutlery, and/or writing: Reports  Review of Systems  Constitutional: Positive for fatigue. Negative for night sweats, weight gain and weight loss.  HENT: Negative for mouth sores, trouble swallowing, trouble swallowing, mouth dryness and nose dryness.   Eyes: Positive for dryness. Negative for pain, redness, itching and visual disturbance.  Respiratory: Negative for cough, shortness of breath and difficulty breathing.   Cardiovascular: Negative for chest pain, palpitations, hypertension,  irregular heartbeat and swelling in legs/feet.  Gastrointestinal: Negative for blood in stool, constipation and diarrhea.  Endocrine: Negative for increased urination.  Genitourinary: Negative for difficulty urinating and vaginal dryness.  Musculoskeletal: Positive for arthralgias, joint pain, myalgias, morning stiffness, muscle tenderness and myalgias. Negative for joint swelling and muscle weakness.  Skin: Negative for color change, rash, hair loss, redness, skin tightness, ulcers and sensitivity to sunlight.  Allergic/Immunologic: Negative for susceptible to infections.  Neurological: Positive for parasthesias and weakness. Negative for dizziness, numbness, headaches, memory loss and night sweats.  Hematological: Positive for bruising/bleeding tendency. Negative for swollen glands.  Psychiatric/Behavioral: Negative for depressed mood, confusion and sleep disturbance. The patient is not nervous/anxious.     PMFS History:  Patient Active Problem List   Diagnosis Date Noted  . Lumbar back pain with radiculopathy affecting left lower extremity 08/05/2020  . Atypical chest pain 03/10/2020  . Shingles 11/19/2019  . Pain in both knees 07/09/2019  . Right arm pain 02/26/2019  . Acute pain of left knee 08/27/2018  . 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 Trigger middle finger of right hand 06/13/2018  . Sciatica of left side 01/24/2018  . 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  . Intermittent palpitations 10/11/2017  . Migraine 12/30/2015  . Upper airway cough syndrome 10/29/2015  . Osteoporosis 06/06/2011  . Vitamin D deficiency 03/22/2010  . BREAST CANCER, HX OF 03/22/2010  . SPINAL STENOSIS, LUMBAR 03/25/2008  . HYPERLIPIDEMIA 09/28/2007  . Fibromyalgia  05/24/2007    Past Medical History:  Diagnosis Date  . Arthritis   . Breast cancer ([facility])    R lumpectomy ; radiation; no chemotherapy  . Bronchitis    recurrent  . Cataract    multifocal lens implant  . Chronic low back pain    bulging disc &  spinal stenosis  . Fibromyalgia   . Hyperlipemia   . Mitral valve prolapse   . Osteoporosis   . Personal history of radiation therapy   . Pneumonia    as OP X 2; no Pneumovax  . Vitamin D deficiency    18 in  01/2009    Family History  Problem Relation Age of Onset  . Atrial fibrillation Mother        S/P cardioversion  . Ulcers Mother   . Hypertension Mother   . Dementia Mother   . Heart attack Father 62       died @ 35  . Heart attack Paternal Aunt        < 33  . Heart attack Paternal Grandfather        > 55  . Heart attack Paternal Uncle        > 55  . Heart attack Paternal Grandmother        < 74  . Hypertension Brother   . Thyroid disease Daughter        on Synthroid since high school  . Diabetes Brother   . Healthy Son   . Stroke Neg Hx   . Breast cancer Neg Hx    Past Surgical History:  Procedure Laterality Date  . ABDOMINAL HYSTERECTOMY  1990   USO for fibroid , Dr Maryruth Eve  . BREAST LUMPECTOMY Right   . BREAST SURGERY  2010   R side lumpectomy; Dr Margot Chimes  . CATARACT EXTRACTION, BILATERAL  2017  . CESAREAN SECTION      G 2 P 2  . CYSTECTOMY  2010  . EYE SURGERY     eye lid surgery  . FOOT SURGERY Right   . no colonoscopy     "I just don't want to do it". SOC reviewed   Social History   Social History Narrative  . Not on file   Immunization History  Administered Date(s) Administered  . Fluad Quad(high Dose 65+) 10/02/2019  . PFIZER SARS-COV-2 Vaccination 01/08/2020, 01/27/2020, 10/17/2020  . Td 12/20/1999     Objective: Vital Signs: BP (!) 143/88 (BP Location: Left Arm, Patient Position: Sitting, Cuff Size: Normal)   Pulse 65   Resp 14   Ht $R'5\' 4"'MV$  (1.626 m)   Wt 133 lb (60.3 kg)   BMI 22.83 kg/m    Physical Exam Vitals and nursing note reviewed.  Constitutional:      Appearance: She is well-developed and well-nourished.  HENT:     Head: Normocephalic and atraumatic.  Eyes:     Extraocular Movements: EOM normal.      Conjunctiva/sclera: Conjunctivae normal.  Cardiovascular:     Rate and Rhythm: Normal rate and regular rhythm.     Pulses: Intact distal pulses.     Heart sounds: Normal heart sounds.  Pulmonary:     Effort: Pulmonary effort is normal.     Breath sounds: Normal breath sounds.  Abdominal:     General: Bowel sounds are normal.     Palpations: Abdomen is soft.  Musculoskeletal:     Cervical back: Normal range of motion.  Lymphadenopathy:     Cervical: No cervical  adenopathy.  Skin:    General: Skin is warm and dry.     Capillary Refill: Capillary refill takes less than 2 seconds.  Neurological:     Mental Status: She is alert and oriented to person, place, and time.  Psychiatric:        Mood and Affect: Mood and affect normal.        Behavior: Behavior normal.      Musculoskeletal Exam: C-spine was in good range of motion.  She has painful limited range of motion of her lumbar spine.  Shoulder joints with good range of motion with some discomfort in her right shoulder.  She had bilateral trapezius spasm.  Elbow joints and wrist joints with good range of motion.  She has bilateral PIP and DIP thickening with no synovitis.  She had discomfort range of motion of her right hip joint.  Knee joints with good range of motion without swelling.  There was no tenderness over ankle joints, MTPs or PIPs.  CDAI Exam: CDAI Score: -- Patient Global: --; Provider Global: -- Swollen: --; Tender: -- Joint Exam 12/28/2020   No joint exam has been documented for this visit   There is currently no information documented on the homunculus. Go to the Rheumatology activity and complete the homunculus joint exam.  Investigation: No additional findings.  Imaging: No results found.  Recent Labs: Lab Results  Component Value Date   WBC 6.1 08/05/2020   HGB 12.7 08/05/2020   PLT 268 08/05/2020   NA 138 08/05/2020   K 4.6 08/05/2020   CL 104 08/05/2020   CO2 25 08/05/2020   GLUCOSE 85 08/05/2020    BUN 18 08/05/2020   CREATININE 1.01 (H) 08/05/2020   BILITOT 0.5 08/05/2020   ALKPHOS 63 04/17/2020   AST 17 08/05/2020   ALT 15 08/05/2020   PROT 7.1 08/05/2020   ALBUMIN 4.6 04/17/2020   CALCIUM 9.5 08/05/2020   GFRAA 64 08/05/2020    Speciality Comments: No specialty comments available.  Procedures:  No procedures performed Allergies: Morphine, Albuterol, Augmentin [amoxicillin-pot clavulanate], Ciprofloxacin, Fish allergy, Gabapentin, Risedronate sodium, Cyclobenzaprine, and Ibandronate sodium   Assessment / Plan:     Visit Diagnoses: Positive ANA (antinuclear antibody) - 08/05/20: ANA 1:640 nuclear, dense fine speckled, ESR 6, CRP 2.7, CK 91 -she has significantly high titer of ANA.  There is no history of oral ulcers, nasal ulcers, malar rash, photosensitivity, Raynaud's phenomena, inflammatory arthritis or lymphadenopathy.  She gives history of dry eyes.  Will obtain following labs today.  Plan: ANA, RNP Antibody, Anti-Smith antibody, Sjogrens syndrome-A extractable nuclear antibody, Sjogrens syndrome-B extractable nuclear antibody, Anti-DNA antibody, double-stranded, Anti-scleroderma antibody, C3 and C4, Beta-2 glycoprotein antibodies, Cardiolipin antibodies, IgG, IgM, IgA, Lupus Anticoagulant Eval w/Reflex  Polyarthralgia-she complains of pain and discomfort in multiple joints.  There is no history of joint inflammation.  She states pain moves around.  Chronic right shoulder pain -she complains of right shoulder joint.  No warmth swelling or effusion was noted.  She has discomfort mostly in the trapezius region.  Plan: XR Shoulder Right.  X-ray of the shoulder joint was unremarkable.  Pain in right hand -she complains of discomfort in her right hand.  No synovitis was noted.  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  No chondrocalcinosis was noted on the x-rays.   Rheumatoid factor, Cyclic citrul peptide antibody, IgG  Pain in right hip -  she has painful range of motion of her right hip joint.  Plan: XR HIP UNILAT W OR W/O PELVIS 2-3 VIEWS RIGHT.  Mild osteoarthritic changes were noted.  Trigger middle finger of right hand-she has intermittent issues with the trigger finger.  DDD (degenerative disc disease), lumbar - and facet joint arthropathy- followed by Dr. Lorin Mercy.  Localized osteoporosis without current pathological fracture-patient was not advised of diagnosis of osteoporosis.  I have advised her to discuss this further with her PCP.  Vitamin D deficiency  Intermittent palpitations  History of breast cancer - 2010, lumpectomy, RTX , no CTX  Fibromyalgia-she was diagnosed with fibromyalgia many years ago.  She has generalized pain, hyperalgesia and positive tender points.  Need for regular exercise was emphasized.  Hx of migraines  History of shingles  Orders: Orders Placed This Encounter  Procedures  . XR Shoulder Right  . XR Hand 2 View Right  . XR HIP UNILAT W OR W/O PELVIS 2-3 VIEWS RIGHT  . ANA  . RNP Antibody  . Anti-Smith antibody  . Sjogrens syndrome-A extractable nuclear antibody  . Sjogrens syndrome-B extractable nuclear antibody  . Anti-DNA antibody, double-stranded  . Anti-scleroderma antibody  . C3 and C4  . Beta-2 glycoprotein antibodies  . Cardiolipin antibodies, IgG, IgM, IgA  . Lupus Anticoagulant Eval w/Reflex  . Rheumatoid factor  . Cyclic citrul peptide antibody, IgG   No orders of the defined types were placed in this encounter.     Follow-Up Instructions: Return for Positive ANA, myalgia, joint pain.   Bo Merino, MD  Note - This record has been created using Editor, commissioning.  Chart creation errors have been sought, but may not always  have been located. Such creation errors do not reflect on  the standard of medical care.

## 2020-12-28 ENCOUNTER — Ambulatory Visit (INDEPENDENT_AMBULATORY_CARE_PROVIDER_SITE_OTHER): Payer: BC Managed Care – PPO | Admitting: Rheumatology

## 2020-12-28 ENCOUNTER — Ambulatory Visit: Payer: Self-pay

## 2020-12-28 ENCOUNTER — Other Ambulatory Visit: Payer: Self-pay

## 2020-12-28 ENCOUNTER — Encounter: Payer: Self-pay | Admitting: Rheumatology

## 2020-12-28 VITALS — BP 143/88 | HR 65 | Resp 14 | Ht 64.0 in | Wt 133.0 lb

## 2020-12-28 DIAGNOSIS — M5416 Radiculopathy, lumbar region: Secondary | ICD-10-CM

## 2020-12-28 DIAGNOSIS — M79641 Pain in right hand: Secondary | ICD-10-CM | POA: Diagnosis not present

## 2020-12-28 DIAGNOSIS — M65331 Trigger finger, right middle finger: Secondary | ICD-10-CM

## 2020-12-28 DIAGNOSIS — M797 Fibromyalgia: Secondary | ICD-10-CM

## 2020-12-28 DIAGNOSIS — Z8619 Personal history of other infectious and parasitic diseases: Secondary | ICD-10-CM

## 2020-12-28 DIAGNOSIS — G8929 Other chronic pain: Secondary | ICD-10-CM

## 2020-12-28 DIAGNOSIS — R768 Other specified abnormal immunological findings in serum: Secondary | ICD-10-CM | POA: Diagnosis not present

## 2020-12-28 DIAGNOSIS — M2550 Pain in unspecified joint: Secondary | ICD-10-CM

## 2020-12-28 DIAGNOSIS — M25551 Pain in right hip: Secondary | ICD-10-CM | POA: Diagnosis not present

## 2020-12-28 DIAGNOSIS — Z8669 Personal history of other diseases of the nervous system and sense organs: Secondary | ICD-10-CM

## 2020-12-28 DIAGNOSIS — M25511 Pain in right shoulder: Secondary | ICD-10-CM | POA: Diagnosis not present

## 2020-12-28 DIAGNOSIS — E559 Vitamin D deficiency, unspecified: Secondary | ICD-10-CM

## 2020-12-28 DIAGNOSIS — Z853 Personal history of malignant neoplasm of breast: Secondary | ICD-10-CM

## 2020-12-28 DIAGNOSIS — M5136 Other intervertebral disc degeneration, lumbar region: Secondary | ICD-10-CM

## 2020-12-28 DIAGNOSIS — M816 Localized osteoporosis [Lequesne]: Secondary | ICD-10-CM

## 2020-12-28 DIAGNOSIS — R002 Palpitations: Secondary | ICD-10-CM

## 2021-01-01 LAB — C3 AND C4
C3 Complement: 136 mg/dL (ref 83–193)
C4 Complement: 36 mg/dL (ref 15–57)

## 2021-01-01 LAB — BETA-2 GLYCOPROTEIN ANTIBODIES
Beta-2 Glyco 1 IgA: <2 U/mL
Beta-2 Glyco 1 IgM: <2 U/mL
Beta-2 Glyco I IgG: <2 U/mL

## 2021-01-01 LAB — LUPUS ANTICOAGULANT EVAL W/ REFLEX
PTT-LA Screen: 34 s (ref <=40)
dRVVT: 29 s (ref <=45)

## 2021-01-01 LAB — ANTI-SCLERODERMA ANTIBODY: Scleroderma (Scl-70) (ENA) Antibody, IgG: <1 AI

## 2021-01-01 LAB — ANTI-NUCLEAR AB-TITER (ANA TITER): ANA Titer 1: 1:640 {titer} — ABNORMAL HIGH

## 2021-01-01 LAB — SJOGRENS SYNDROME-B EXTRACTABLE NUCLEAR ANTIBODY: SSB (La) (ENA) Antibody, IgG: <1 AI

## 2021-01-01 LAB — CYCLIC CITRUL PEPTIDE ANTIBODY, IGG: Cyclic Citrullin Peptide Ab: <16 UNITS

## 2021-01-01 LAB — RNP ANTIBODY: Ribonucleic Protein(ENA) Antibody, IgG: <1 AI

## 2021-01-01 LAB — ANA: Anti Nuclear Antibody (ANA): POSITIVE — AB

## 2021-01-01 LAB — CARDIOLIPIN ANTIBODIES, IGG, IGM, IGA
Anticardiolipin IgA: <2 APL-U/mL
Anticardiolipin IgG: <2 GPL-U/mL
Anticardiolipin IgM: <2 MPL-U/mL

## 2021-01-01 LAB — SJOGRENS SYNDROME-A EXTRACTABLE NUCLEAR ANTIBODY: SSA (Ro) (ENA) Antibody, IgG: <1 AI

## 2021-01-01 LAB — ANTI-SMITH ANTIBODY: ENA SM Ab Ser-aCnc: <1 AI

## 2021-01-01 LAB — ANTI-DNA ANTIBODY, DOUBLE-STRANDED: ds DNA Ab: <1 IU/mL

## 2021-01-01 LAB — RHEUMATOID FACTOR: Rheumatoid fact SerPl-aCnc: <14 IU/mL (ref <14)

## 2021-01-01 MED ORDER — AMLODIPINE BESYLATE 5 MG PO TABS: 5.0000 mg | ORAL_TABLET | Freq: Every day | ORAL | 3 refills | Status: DC

## 2021-01-01 NOTE — Addendum Note (Signed)
Addended by: Binnie Rail on: 01/01/2021 05:29 AM   Modules accepted: Orders

## 2021-01-10 NOTE — Progress Notes (Signed)
Office Visit Note  Patient: [name]             Date of Birth: [birth year]           MRN: [id number]             PCP: Binnie Rail, MD Referring: Binnie Rail, MD Visit Date: 01/19/2021 Occupation: @GUAROCC @  Subjective:  Pain in multiple joints and muscles    History of Present Illness: [name] is a 75 y.o. female returns for follow-up visit today.  She states she continues to have pain and discomfort in multiple joints.  She notices swelling in her hands.  She has been also having discomfort in her feet.  She complains of dry mouth, hair loss, Raynaud's phenomenon.  She denies any history of oral ulcers, nasal ulcers, malar rash, photosensitivity, lymphadenopathy.  Activities of Daily Living:  Patient reports morning stiffness for 24 hours.   Patient Denies nocturnal pain.  Difficulty dressing/grooming: Denies Difficulty climbing stairs: Denies Difficulty getting out of chair: Reports Difficulty using hands for taps, buttons, cutlery, and/or writing: Reports  Review of Systems  Constitutional: Positive for fatigue. Negative for night sweats, weight gain and weight loss.  HENT: Positive for mouth dryness. Negative for mouth sores, trouble swallowing, trouble swallowing and nose dryness.   Eyes: Positive for dryness. Negative for pain, redness and visual disturbance.  Respiratory: Negative for cough, shortness of breath and difficulty breathing.   Cardiovascular: Positive for palpitations. Negative for chest pain, hypertension, irregular heartbeat and swelling in legs/feet.  Gastrointestinal: Positive for constipation. Negative for blood in stool and diarrhea.  Endocrine: Negative for excessive thirst and increased urination.  Genitourinary: Positive for painful urination. Negative for vaginal dryness.  Musculoskeletal: Positive for arthralgias, joint pain, joint swelling, morning stiffness and muscle tenderness. Negative for myalgias, muscle weakness and  myalgias.  Skin: Positive for color change and hair loss. Negative for rash, skin tightness, ulcers and sensitivity to sunlight.  Allergic/Immunologic: Negative for susceptible to infections.  Neurological: Positive for weakness. Negative for dizziness, memory loss and night sweats.  Hematological: Positive for bruising/bleeding tendency. Negative for swollen glands.  Psychiatric/Behavioral: Positive for sleep disturbance. Negative for depressed mood. The patient is not nervous/anxious.     PMFS History:  Patient Active Problem List   Diagnosis Date Noted  . Lumbar back pain with radiculopathy affecting left lower extremity 08/05/2020  . Atypical chest pain 03/10/2020  . Shingles 11/19/2019  . Pain in both knees 07/09/2019  . Right arm pain 02/26/2019  . Acute pain of left knee 08/27/2018  . 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 Trigger middle finger of right hand 06/13/2018  . Sciatica of left side 01/24/2018  . 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  . Intermittent palpitations 10/11/2017  . Migraine 12/30/2015  . Upper airway cough syndrome 10/29/2015  . Osteoporosis 06/06/2011  . Vitamin D deficiency 03/22/2010  . BREAST CANCER, HX OF 03/22/2010  . SPINAL STENOSIS, LUMBAR 03/25/2008  . HYPERLIPIDEMIA 09/28/2007  . Fibromyalgia 05/24/2007    Past Medical History:  Diagnosis Date  . Arthritis   . Breast cancer ([place])    R lumpectomy ; radiation; no chemotherapy  . Bronchitis    recurrent  . Cataract    multifocal lens implant  . Chronic low back pain    bulging disc & spinal stenosis  . Fibromyalgia   . Hyperlipemia   . Mitral valve prolapse   . Osteoporosis   . Personal history of radiation therapy   .  Pneumonia    as OP X 2; no Pneumovax  . Vitamin D deficiency    19 in  01/2009    Family History  Problem Relation Age of Onset  . Atrial fibrillation Mother        S/P cardioversion  . Ulcers Mother   . Hypertension Mother   . Dementia Mother   . Heart attack Father 19        died @ 39  . Heart attack Paternal Aunt        < 78  . Heart attack Paternal Grandfather        > 55  . Heart attack Paternal Uncle        > 55  . Heart attack Paternal Grandmother        < 77  . Hypertension Brother   . Thyroid disease Daughter        on Synthroid since high school  . Diabetes Brother   . Healthy Son   . Stroke Neg Hx   . Breast cancer Neg Hx    Past Surgical History:  Procedure Laterality Date  . ABDOMINAL HYSTERECTOMY  1990   [facility]O for fibroid , Dr Maryruth Eve  . BREAST LUMPECTOMY Right   . BREAST SURGERY  2010   R side lumpectomy; Dr Margot Chimes  . CATARACT EXTRACTION, BILATERAL  2017  . CESAREAN SECTION      G 2 P 2  . CYSTECTOMY  2010  . EYE SURGERY     eye lid surgery  . FOOT SURGERY Right   . no colonoscopy     "I just don't want to do it". SOC reviewed   Social History   Social History Narrative  . Not on file   Immunization History  Administered Date(s) Administered  . Fluad Quad(high Dose 65+) 10/02/2019  . PFIZER(Purple Top)SARS-COV-2 Vaccination 01/08/2020, 01/27/2020, 10/17/2020  . Td 12/20/1999     Objective: Vital Signs: BP 133/79 (BP Location: Left Arm, Patient Position: Sitting, Cuff Size: Normal)   Pulse 66   Resp 16   Ht 5\' 5"  (1.651 m)   Wt 133 lb (60.3 kg)   BMI 22.13 kg/m    Physical Exam Vitals and nursing note reviewed.  Constitutional:      Appearance: She is well-developed and well-nourished.  HENT:     Head: Normocephalic and atraumatic.  Eyes:     Extraocular Movements: EOM normal.     Conjunctiva/sclera: Conjunctivae normal.  Cardiovascular:     Rate and Rhythm: Normal rate and regular rhythm.     Pulses: Intact distal pulses.     Heart sounds: Normal heart sounds.  Pulmonary:     Effort: Pulmonary effort is normal.     Breath sounds: Normal breath sounds.  Abdominal:     General: Bowel sounds are normal.     Palpations: Abdomen is soft.  Musculoskeletal:     Cervical back: Normal range of motion.   Lymphadenopathy:     Cervical: No cervical adenopathy.  Skin:    General: Skin is warm and dry.     Capillary Refill: Capillary refill takes less than 2 seconds.     Comments: Frontal hair thinning was noted.  Neurological:     Mental Status: She is alert and oriented to person, place, and time.  Psychiatric:        Mood and Affect: Mood and affect normal.        Behavior: Behavior normal.      Musculoskeletal Exam: C-spine  thoracic and lumbar spine with good range of motion.  She has some discomfort with range of motion of her right shoulder joint over the subacromial region.  Elbow joints and wrist joints with good range of motion with no synovitis.  She has synovitis over some of her MCPs as described below.  She has discomfort range of motion of her hip joints.  Knee joints with good range of motion with no synovitis.  There was no tenderness across ankle joints.  She has prominence of MTP joints with some discomfort.  CDAI Exam: CDAI Score: 14  Patient Global: 5 mm; Provider Global: 5 mm Swollen: 6 ; Tender: 8  Joint Exam 01/19/2021      Right  Left  Glenohumeral   Tender     CMC      Tender  MCP 1  Swollen Tender     MCP 2  Swollen Tender  Swollen Tender  MCP 3  Swollen Tender     MCP 4     Swollen Tender  PIP 4  Swollen Tender        Investigation: No additional findings.  Imaging: XR HIP UNILAT W OR W/O PELVIS 2-3 VIEWS RIGHT  Result Date: 12/28/2020 No SI joint sclerosis or narrowing was noted.  Inferior medial narrowing of the right hip joint was noted.  No chondrocalcinosis was noted.  Degenerative changes were noted in the lumbar spine. Impression: Early osteoarthritic changes of the hip joint was noted.  Degenerative changes in the lumbar spine were noted.  XR Hand 2 View Right  Result Date: 12/28/2020 Second and third and fourth MCP joint narrowing was noted.  No juxta-articular osteopenia was noted.  PIP and DIP narrowing was noted.  No intercarpal or  radiocarpal joint space narrowing was noted.  CMC narrowing was noted.  No erosive changes were noted. Impression: These findings are consistent with osteoarthritis and possible inflammatory arthritis due to MCP narrowing.  XR Shoulder Right  Result Date: 12/28/2020 No glenohumeral joint space narrowing was noted.  No chondrocalcinosis was noted.  No acromioclavicular joint space narrowing was noted. Impression: Unremarkable x-ray of the shoulder joint.   Recent Labs: Lab Results  Component Value Date   WBC 6.1 08/05/2020   HGB 12.7 08/05/2020   PLT 268 08/05/2020   NA 138 08/05/2020   K 4.6 08/05/2020   CL 104 08/05/2020   CO2 25 08/05/2020   GLUCOSE 85 08/05/2020   BUN 18 08/05/2020   CREATININE 1.01 (H) 08/05/2020   BILITOT 0.5 08/05/2020   ALKPHOS 63 04/17/2020   AST 17 08/05/2020   ALT 15 08/05/2020   PROT 7.1 08/05/2020   ALBUMIN 4.6 04/17/2020   CALCIUM 9.5 08/05/2020   GFRAA 64 08/05/2020   December 29, 2019 ANA 1: 640NS, ENA negative, C3-C4 normal, anticardiolipin negative, beta-2 GP 1 negative, lupus anticoagulant negative, RF negative, anti-CCP negative Speciality Comments: No specialty comments available.  Procedures:  No procedures performed Allergies: Morphine, Albuterol, Augmentin [amoxicillin-pot clavulanate], Ciprofloxacin, Fish allergy, Gabapentin, Risedronate sodium, Cyclobenzaprine, and Ibandronate sodium   Assessment / Plan:     Visit Diagnoses: Autoimmune disease ([place]) - ANA 1: 640, all other autoimmune labs were negative.  She gives history of dry eyes, Raynaud's phenomenon, inflammatory arthritis and hair loss.  No history of oral ulcers, nasal ulcers, malar rash, or lymphadenopathy.  Detailed counseling guarding autoimmune disease was provided.  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  Indications side effects contraindications of different medications were discussed.  I would like to give a trial of hydroxychloroquine.  Patient  gives history of palpitations.  Have advised her to get a baseline EKG to look for arrhythmias and QT interval prior to starting her on hydroxychloroquine.  My plan is to start her on hydroxychloroquine 200 mg p.o. twice daily Monday to Friday once we get EKG and lab results.  I will check her labs today as well.- Plan: Urinalysis, Routine w reflex microscopic  Patient was counseled on the purpose, proper use, and adverse effects of hydroxychloroquine including nausea/diarrhea, skin rash, headaches, and sun sensitivity.  Discussed importance of annual eye exams while on hydroxychloroquine to monitor to ocular toxicity and discussed importance of frequent laboratory monitoring.  Provided patient with eye exam form for baseline ophthalmologic exam.  Provided patient with educational materials on hydroxychloroquine and answered all questions.  Patient consented to hydroxychloroquine.  Will upload consent in the media tab.    Dose will be Plaquenil 200 mg twice daily Monday through Friday.  Prescription pending lab results.   High risk medication use -in anticipation to start her on Plaquenil I will obtain following labs today.  Plan: CBC with Differential/Platelet, COMPLETE METABOLIC PANEL WITH GFR, Glucose 6 phosphate dehydrogenase  Chronic right shoulder pain - X-ray was unremarkable.  She has some subacromial bursitis.  Primary osteoarthritis of right hand - Second and third MCP narrowing and DIP, PIP and CMC narrowing was noted.  There is no history of joint swelling.  Pain in both hands -she continues to have pain and discomfort in her bilateral hands.  Some synovitis was noted.  She also has osteoarthritis.  I reviewed the previous x-ray of her left hand.  I will obtain another x-ray today for comparison.  Plan: XR Hand 2 View Left x-rays are consistent with osteoarthritis.  Mild third and fourth MCP narrowing was noted.  But no radiographic progression was noted when compared to the x-rays of  2019.  Pain in both feet -she complains of off-and-on discomfort in her feet.  Baseline x-rays will be obtained today.  Plan: XR Foot 2 Views Right, XR Foot 2 Views Left.  X-rays are consistent with osteoarthritis and bilateral posterior calcaneal spurs.  A screw was noted in the right fifth metatarsal head.  Marissa(CMA) notified her of the x-ray results.  Trigger middle finger of right hand-she has intermittent discomfort.  Primary osteoarthritis of right hip - Early osteoarthritic changes were noted in the x-ray of the lumbar spine.  She has discomfort with range of motion of her hip joints.  DDD (degenerative disc disease), lumbar - And facet joint arthropathy, followed by Dr. Lorin Mercy.  Age-related osteoporosis without current pathological fracture - Followed by her PCP.Intolerence to Boniva and Fosamax per patient.  Vitamin D deficiency  Intermittent palpitations-patient complains of intermittent palpitations.  Have advised her to get a baseline EKG with her PCP to rule out arrhythmias and evaluate QT interval prior to restarting on Plaquenil.  History of breast cancer - 2010, lumpectomy, RTX , no CTX  Fibromyalgia - Diagnosed several years ago.  She continues to have generalized pain, hyperalgesia and positive tender points.  Hx of migraines  History of shingles    Orders: Orders Placed This Encounter  Procedures  . XR Hand 2 View Left  . XR Foot 2 Views Right  . XR Foot 2 Views Left  . Urinalysis, Routine w reflex microscopic  . CBC with Differential/Platelet  . COMPLETE METABOLIC PANEL WITH GFR  . Glucose 6 phosphate dehydrogenase  No orders of the defined types were placed in this encounter.     Follow-Up Instructions: Return in about 6 weeks (around 03/02/2021) for Autoimmune disease.   Bo Merino, MD  Note - This record has been created using Editor, commissioning.  Chart creation errors have been sought, but may not always  have been located. Such creation  errors do not reflect on  the standard of medical care.

## 2021-01-13 ENCOUNTER — Other Ambulatory Visit: Payer: Self-pay | Admitting: Internal Medicine

## 2021-01-19 ENCOUNTER — Other Ambulatory Visit: Payer: Self-pay

## 2021-01-19 ENCOUNTER — Telehealth: Payer: Self-pay

## 2021-01-19 ENCOUNTER — Ambulatory Visit: Payer: Self-pay

## 2021-01-19 ENCOUNTER — Encounter: Payer: Self-pay | Admitting: Rheumatology

## 2021-01-19 ENCOUNTER — Ambulatory Visit (INDEPENDENT_AMBULATORY_CARE_PROVIDER_SITE_OTHER): Payer: BC Managed Care – PPO | Admitting: Rheumatology

## 2021-01-19 VITALS — BP 133/79 | HR 66 | Resp 16 | Ht 65.0 in | Wt 133.0 lb

## 2021-01-19 DIAGNOSIS — M19041 Primary osteoarthritis, right hand: Secondary | ICD-10-CM

## 2021-01-19 DIAGNOSIS — R768 Other specified abnormal immunological findings in serum: Secondary | ICD-10-CM

## 2021-01-19 DIAGNOSIS — M79672 Pain in left foot: Secondary | ICD-10-CM

## 2021-01-19 DIAGNOSIS — M79641 Pain in right hand: Secondary | ICD-10-CM

## 2021-01-19 DIAGNOSIS — M25511 Pain in right shoulder: Secondary | ICD-10-CM | POA: Diagnosis not present

## 2021-01-19 DIAGNOSIS — M797 Fibromyalgia: Secondary | ICD-10-CM

## 2021-01-19 DIAGNOSIS — M810 Age-related osteoporosis without current pathological fracture: Secondary | ICD-10-CM

## 2021-01-19 DIAGNOSIS — M79671 Pain in right foot: Secondary | ICD-10-CM

## 2021-01-19 DIAGNOSIS — M79642 Pain in left hand: Secondary | ICD-10-CM | POA: Diagnosis not present

## 2021-01-19 DIAGNOSIS — M65331 Trigger finger, right middle finger: Secondary | ICD-10-CM | POA: Diagnosis not present

## 2021-01-19 DIAGNOSIS — M816 Localized osteoporosis [Lequesne]: Secondary | ICD-10-CM

## 2021-01-19 DIAGNOSIS — E559 Vitamin D deficiency, unspecified: Secondary | ICD-10-CM

## 2021-01-19 DIAGNOSIS — M359 Systemic involvement of connective tissue, unspecified: Secondary | ICD-10-CM

## 2021-01-19 DIAGNOSIS — Z8619 Personal history of other infectious and parasitic diseases: Secondary | ICD-10-CM

## 2021-01-19 DIAGNOSIS — Z853 Personal history of malignant neoplasm of breast: Secondary | ICD-10-CM

## 2021-01-19 DIAGNOSIS — R002 Palpitations: Secondary | ICD-10-CM

## 2021-01-19 DIAGNOSIS — R300 Dysuria: Secondary | ICD-10-CM

## 2021-01-19 DIAGNOSIS — M5136 Other intervertebral disc degeneration, lumbar region: Secondary | ICD-10-CM

## 2021-01-19 DIAGNOSIS — Z79899 Other long term (current) drug therapy: Secondary | ICD-10-CM | POA: Diagnosis not present

## 2021-01-19 DIAGNOSIS — G8929 Other chronic pain: Secondary | ICD-10-CM

## 2021-01-19 DIAGNOSIS — M1611 Unilateral primary osteoarthritis, right hip: Secondary | ICD-10-CM

## 2021-01-19 DIAGNOSIS — Z8669 Personal history of other diseases of the nervous system and sense organs: Secondary | ICD-10-CM

## 2021-01-19 NOTE — Telephone Encounter (Signed)
Pending lab results and EKG results, patient will be starting plaquenil per Dr. Estanislado Pandy.   Consent obtained and sent to the scan center.  Provided patient with prescription and she will be getting an EKG through her PCP.

## 2021-01-19 NOTE — Patient Instructions (Signed)
Hydroxychloroquine tablets What is this medicine? HYDROXYCHLOROQUINE (hye drox ee KLOR oh kwin) is used to treat rheumatoid arthritis and systemic lupus erythematosus. It is also used to treat malaria. This medicine may be used for other purposes; ask your health care provider or pharmacist if you have questions. COMMON BRAND NAME(S): Plaquenil, Quineprox What should I tell my health care provider before I take this medicine? They need to know if you have any of these conditions:  diabetes  eye disease, vision problems  G6PD deficiency  heart disease  history of irregular heartbeat  if you often drink alcohol  kidney disease  liver disease  porphyria  psoriasis  an unusual or allergic reaction to chloroquine, hydroxychloroquine, other medicines, foods, dyes, or preservatives  pregnant or trying to get pregnant  breast-feeding How should I use this medicine? Take this medicine by mouth with a glass of water. Take it as directed on the prescription label. Do not cut, crush or chew this medicine. Swallow the tablets whole. Take it with food. Do not take it more than directed. Take all of this medicine unless your health care provider tells you to stop it early. Keep taking it even if you think you are better. Take products with antacids in them at a different time of day than this medicine. Take this medicine 4 hours before or 4 hours after antacids. Talk to your health care provider if you have questions. Talk to your pediatrician regarding the use of this medicine in children. While this drug may be prescribed for selected conditions, precautions do apply. Overdosage: If you think you have taken too much of this medicine contact a poison control center or emergency room at once. NOTE: This medicine is only for you. Do not share this medicine with others. What if I miss a dose? If you miss a dose, take it as soon as you can. If it is almost time for your next dose, take only  that dose. Do not take double or extra doses. What may interact with this medicine? Do not take this medicine with any of the following medications:  cisapride  dronedarone  pimozide  thioridazine This medicine may also interact with the following medications:  ampicillin  antacids  cimetidine  cyclosporine  digoxin  kaolin  medicines for diabetes, like insulin, glipizide, glyburide  medicines for seizures like carbamazepine, phenobarbital, phenytoin  mefloquine  methotrexate  other medicines that prolong the QT interval (cause an abnormal heart rhythm)  praziquantel This list may not describe all possible interactions. Give your health care provider a list of all the medicines, herbs, non-prescription drugs, or dietary supplements you use. Also tell them if you smoke, drink alcohol, or use illegal drugs. Some items may interact with your medicine. What should I watch for while using this medicine? Visit your health care provider for regular checks on your progress. Tell your health care provider if your symptoms do not start to get better or if they get worse. You may need blood work done while you are taking this medicine. If you take other medicines that can affect heart rhythm, you may need more testing. Talk to your health care provider if you have questions. Your vision may be tested before and during use of this medicine. Tell your health care provider right away if you have any change in your eyesight. This medicine may cause serious skin reactions. They can happen weeks to months after starting the medicine. Contact your health care provider right away if you  notice fevers or flu-like symptoms with a rash. The rash may be red or purple and then turn into blisters or peeling of the skin. Or, you might notice a red rash with swelling of the face, lips or lymph nodes in your neck or under your arms. If you or your family notice any changes in your behavior, such as new  or worsening depression, thoughts of harming yourself, anxiety, or other unusual or disturbing thoughts, or memory loss, call your health care provider right away. What side effects may I notice from receiving this medicine? Side effects that you should report to your doctor or health care professional as soon as possible:  allergic reactions (skin rash, itching or hives; swelling of the face, lips, or tongue)  changes in vision  decreased hearing, ringing in the ears  heartbeat rhythm changes (trouble breathing; chest pain; dizziness; fast, irregular heartbeat; feeling faint or lightheaded, falls)  liver injury (dark yellow or brown urine; general ill feeling or flu-like symptoms; loss of appetite, right upper belly pain; unusually weak or tired, yellowing of the eyes or skin)  low blood sugar (feeling anxious; confusion; dizziness; increased hunger; unusually weak or tired; increased sweating; shakiness; cold, clammy skin; irritable; headache; blurred vision; fast heartbeat; loss of consciousness)  low red blood cell counts (trouble breathing; feeling faint; lightheaded, falls; unusually weak or tired)  muscle weakness  pain, tingling, numbness in the hands or feet  rash, fever, and swollen lymph nodes  redness, blistering, peeling or loosening of the skin, including inside the mouth  suicidal thoughts, mood changes  uncontrollable head, mouth, neck, arm, or leg movements  unusual bruising or bleeding Side effects that usually do not require medical attention (report to your doctor or health care professional if they continue or are bothersome):  diarrhea  hair loss  irritable This list may not describe all possible side effects. Call your doctor for medical advice about side effects. You may report side effects to FDA at 1-800-FDA-1088. Where should I keep my medicine? Keep out of the reach of children and pets. Store at room temperature up to 30 degrees C (86 degrees F).  Protect from light. Get rid of any unused medicine after the expiration date. To get rid of medicines that are no longer needed or have expired:  Take the medicine to a medicine take-back program. Check with your pharmacy or law enforcement to find a location.  If you cannot return the medicine, check the label or package insert to see if the medicine should be thrown out in the garbage or flushed down the toilet. If you are not sure, ask your health care provider. If it is safe to put it in the trash, empty the medicine out of the container. Mix the medicine with cat litter, dirt, coffee grounds, or other unwanted substance. Seal the mixture in a bag or container. Put it in the trash. NOTE: This sheet is a summary. It may not cover all possible information. If you have questions about this medicine, talk to your doctor, pharmacist, or health care provider.  2021 Elsevier/Gold Standard (2020-05-25 15:07:49)  Standing Labs We placed an order today for your standing lab work.   Please have your standing labs drawn in  51month then 3 months  If possible, please have your labs drawn 2 weeks prior to your appointment so that the provider can discuss your results at your appointment.  We have open lab daily Monday through Thursday from 8:30-12:30 PM and 1:30-4:30 PM  and Friday from 8:30-12:30 PM and 1:30-4:00 PM at the office of Dr. Bo Merino, [facility].   Please be advised, all patients with office appointments requiring lab work will take precedents over walk-in lab work.  If possible, please come for your lab work on Monday and Friday afternoons, as you may experience shorter wait times. The office is located at [address] [place], [place], [place] [address] No appointment is necessary.   Labs are drawn by [facility]. Please bring your co-pay at the time of your lab draw.  You may receive a bill from [facility] for your lab work.  If you wish to have your labs drawn at  another location, please call the office 24 hours in advance to send orders.  If you have any questions regarding directions or hours of operation,  please call [phone].   As a reminder, please drink plenty of water prior to coming for your lab work. Thanks!

## 2021-01-20 LAB — URINALYSIS, ROUTINE W REFLEX MICROSCOPIC
Bacteria, UA: NONE SEEN /HPF
Bilirubin Urine: NEGATIVE
Glucose, UA: NEGATIVE
Hyaline Cast: NONE SEEN /LPF
Ketones, ur: NEGATIVE
Nitrite: NEGATIVE
Specific Gravity, Urine: 1.021 (ref 1.001–1.03)
Squamous Epithelial / HPF: NONE SEEN /HPF (ref <=5)
WBC, UA: >=60 /HPF — AB (ref 0–5)
pH: <=5 (ref 5.0–8.0)

## 2021-01-20 LAB — CBC WITH DIFFERENTIAL/PLATELET
Absolute Monocytes: 601 cells/uL (ref 200–950)
Basophils Absolute: 40 cells/uL (ref 0–200)
Basophils Relative: 0.6 %
Eosinophils Absolute: 99 cells/uL (ref 15–500)
Eosinophils Relative: 1.5 %
HCT: 36.7 % (ref 35.0–45.0)
Hemoglobin: 12.5 g/dL (ref 11.7–15.5)
Lymphs Abs: 1360 cells/uL (ref 850–3900)
MCH: 31.5 pg (ref 27.0–33.0)
MCHC: 34.1 g/dL (ref 32.0–36.0)
MCV: 92.4 fL (ref 80.0–100.0)
MPV: 10 fL (ref 7.5–12.5)
Monocytes Relative: 9.1 %
Neutro Abs: 4501 cells/uL (ref 1500–7800)
Neutrophils Relative %: 68.2 %
Platelets: 296 10*3/uL (ref 140–400)
RBC: 3.97 10*6/uL (ref 3.80–5.10)
RDW: 12.6 % (ref 11.0–15.0)
Total Lymphocyte: 20.6 %
WBC: 6.6 10*3/uL (ref 3.8–10.8)

## 2021-01-20 LAB — COMPLETE METABOLIC PANEL WITH GFR
AG Ratio: 1.6 (calc) (ref 1.0–2.5)
ALT: 10 U/L (ref 6–29)
AST: 14 U/L (ref 10–35)
Albumin: 4.2 g/dL (ref 3.6–5.1)
Alkaline phosphatase (APISO): 60 U/L (ref 37–153)
BUN/Creatinine Ratio: 21 (calc) (ref 6–22)
BUN: 25 mg/dL (ref 7–25)
CO2: 26 mmol/L (ref 20–32)
Calcium: 9.4 mg/dL (ref 8.6–10.4)
Chloride: 105 mmol/L (ref 98–110)
Creat: 1.17 mg/dL — ABNORMAL HIGH (ref 0.60–0.93)
GFR, Est African American: 54 mL/min/{1.73_m2} — ABNORMAL LOW (ref >=60)
GFR, Est Non African American: 46 mL/min/{1.73_m2} — ABNORMAL LOW (ref >=60)
Globulin: 2.6 g/dL (calc) (ref 1.9–3.7)
Glucose, Bld: 88 mg/dL (ref 65–99)
Potassium: 4.9 mmol/L (ref 3.5–5.3)
Sodium: 139 mmol/L (ref 135–146)
Total Bilirubin: 0.3 mg/dL (ref 0.2–1.2)
Total Protein: 6.8 g/dL (ref 6.1–8.1)

## 2021-01-20 LAB — GLUCOSE 6 PHOSPHATE DEHYDROGENASE: G-6PDH: 17.9 U/g Hgb (ref 7.0–20.5)

## 2021-01-20 NOTE — Progress Notes (Signed)
UA shows 2+ protein and elevated leukocytes.  No bacteria were seen.  If patient is symptomatic then I would recommend getting a bacterial culture through her PCP.  Creatinine is elevated, CBC is normal, G6PD is pending.

## 2021-01-20 NOTE — Progress Notes (Signed)
Subjective:    Patient ID: [name], female    DOB: 1945/12/20, 75 y.o.   MRN: [id number]  HPI The patient is here for an acute visit.  She needs an EKG.  Will be starting hydroxychlorquine by rheum and needs EKG to r/o arrhythmia and eval QT.   Had abnormal UA at Rheumatologists office.  She has dysuria.  Her symptoms started about one week ago.  She has increased frequency and dysuria.  She denies blood.    She has OA, inflammatory arthritis, osteoporosis and fibromyalgia - the inflammatory arthritis is new.  She does have pain.  She wonders if she could take gabapentin daily prn in the afternoon - that has helped with her pain in the past.       Medications and allergies reviewed with patient and updated if appropriate.  Patient Active Problem List   Diagnosis Date Noted  . At risk for long QT syndrome 01/21/2021  . Lumbar back pain with radiculopathy affecting left lower extremity 08/05/2020  . Atypical chest pain 03/10/2020  . Shingles 11/19/2019  . Pain in both knees 07/09/2019  . Right arm pain 02/26/2019  . Acute pain of left knee 08/27/2018  . 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 Trigger middle finger of right hand 06/13/2018  . Sciatica of left side 01/24/2018  . 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  . Intermittent palpitations 10/11/2017  . Migraine 12/30/2015  . Upper airway cough syndrome 10/29/2015  . Osteoporosis 06/06/2011  . Vitamin D deficiency 03/22/2010  . BREAST CANCER, HX OF 03/22/2010  . SPINAL STENOSIS, LUMBAR 03/25/2008  . HYPERLIPIDEMIA 09/28/2007  . Fibromyalgia 05/24/2007    Current Outpatient Medications on File Prior to Visit  Medication Sig Dispense Refill  . acetaminophen (TYLENOL) 500 MG tablet Take 1,000 mg by mouth daily as needed for mild pain.    Marland Kitchen amLODipine (NORVASC) 5 MG tablet Take 1 tablet (5 mg total) by mouth daily. 90 tablet 3  . cholecalciferol (VITAMIN D3) 25 MCG (1000 UNIT) tablet Take 1,000 Units by mouth daily.    Marland Kitchen  eletriptan (RELPAX) 20 MG tablet TAKE 1 TABLET BY MOUTH EVERY 2 HOURS AS NEEDED FOR HEADACHE OR MIGRAINE. MAY REPEAT IN 2 HOURS IF HEADACHE PERSISTS OR RECURS (Patient taking differently: Take 20 mg by mouth See admin instructions. Every 2 hours as needed for headache or migraine. May repeat in 2 hours if headache persists.) 9 tablet 1  . MAGNESIUM PO Take 1 tablet by mouth daily as needed.    . traZODone (DESYREL) 50 MG tablet TAKE 3 TABLETS(150 MG) BY MOUTH AT BEDTIME 90 tablet 1   No current facility-administered medications on file prior to visit.    Past Medical History:  Diagnosis Date  . Arthritis   . Breast cancer ([place])    R lumpectomy ; radiation; no chemotherapy  . Bronchitis    recurrent  . Cataract    multifocal lens implant  . Chronic low back pain    bulging disc & spinal stenosis  . Fibromyalgia   . Hyperlipemia   . Mitral valve prolapse   . Osteoporosis   . Personal history of radiation therapy   . Pneumonia    as OP X 2; no Pneumovax  . Vitamin D deficiency    18 in  01/2009    Past Surgical History:  Procedure Laterality Date  . ABDOMINAL HYSTERECTOMY  1990   USO for fibroid , Dr Maryruth Eve  . BREAST LUMPECTOMY Right   .  BREAST SURGERY  2010   R side lumpectomy; Dr Margot Chimes  . CATARACT EXTRACTION, BILATERAL  2017  . CESAREAN SECTION      G 2 P 2  . CYSTECTOMY  2010  . EYE SURGERY     eye lid surgery  . FOOT SURGERY Right   . no colonoscopy     "I just don't want to do it". SOC reviewed    Social History   Socioeconomic History  . Marital status: Married    Spouse name: Not on file  . Number of children: Not on file  . Years of education: Not on file  . Highest education level: Not on file  Occupational History  . Not on file  Tobacco Use  . Smoking status: Former Smoker    Quit date: 12/19/1970    Years since quitting: 50.1  . Smokeless tobacco: Never Used  . Tobacco comment: [id number], up to 3 cigarettes / day  Vaping Use  . Vaping Use:  Never used  Substance and Sexual Activity  . Alcohol use: No  . Drug use: No  . Sexual activity: Not on file  Other Topics Concern  . Not on file  Social History Narrative  . Not on file   Social Determinants of Health   Financial Resource Strain: Not on file  Food Insecurity: Not on file  Transportation Needs: Not on file  Physical Activity: Not on file  Stress: Not on file  Social Connections: Not on file    Family History  Problem Relation Age of Onset  . Atrial fibrillation Mother        S/P cardioversion  . Ulcers Mother   . Hypertension Mother   . Dementia Mother   . Heart attack Father 37       died @ 48  . Heart attack Paternal Aunt        < 34  . Heart attack Paternal Grandfather        > 55  . Heart attack Paternal Uncle        > 55  . Heart attack Paternal Grandmother        < 93  . Hypertension Brother   . Thyroid disease Daughter        on Synthroid since high school  . Diabetes Brother   . Healthy Son   . Stroke Neg Hx   . Breast cancer Neg Hx     Review of Systems  Constitutional: Negative for fever (low grade).  Gastrointestinal: Negative for abdominal pain.  Genitourinary: Positive for dysuria and frequency. Negative for hematuria.  Musculoskeletal: Positive for back pain (chronic).       Objective:   Vitals:   01/21/21 1122  BP: 120/76  Pulse: (!) 43  Temp: 98.6 F (37 C)  SpO2: 99%   BP Readings from Last 3 Encounters:  01/21/21 120/76  01/19/21 133/79  12/28/20 (!) 143/88   Wt Readings from Last 3 Encounters:  01/19/21 133 lb (60.3 kg)  12/28/20 133 lb (60.3 kg)  11/03/20 128 lb (58.1 kg)   Body mass index is 22.13 kg/m.   Physical Exam    Constitutional: Appears well-developed and well-nourished. No distress.  Head: Normocephalic and atraumatic.  Abdomen: soft, NT, ND Skin: Skin is warm and dry. Not diaphoretic.        Assessment & Plan:    See Problem List for Assessment and Plan of chronic medical  problems.    This visit occurred during the SARS-CoV-2  public health emergency.  Safety protocols were in place, including screening questions prior to the visit, additional usage of staff PPE, and extensive cleaning of exam room while observing appropriate contact time as indicated for disinfecting solutions.

## 2021-01-21 ENCOUNTER — Other Ambulatory Visit: Payer: Self-pay

## 2021-01-21 ENCOUNTER — Ambulatory Visit (INDEPENDENT_AMBULATORY_CARE_PROVIDER_SITE_OTHER): Payer: BC Managed Care – PPO | Admitting: Internal Medicine

## 2021-01-21 ENCOUNTER — Encounter: Payer: Self-pay | Admitting: Internal Medicine

## 2021-01-21 VITALS — BP 120/76 | HR 43 | Temp 98.6°F | Ht 65.0 in

## 2021-01-21 DIAGNOSIS — M797 Fibromyalgia: Secondary | ICD-10-CM

## 2021-01-21 DIAGNOSIS — R300 Dysuria: Secondary | ICD-10-CM | POA: Diagnosis not present

## 2021-01-21 DIAGNOSIS — N3000 Acute cystitis without hematuria: Secondary | ICD-10-CM | POA: Diagnosis not present

## 2021-01-21 DIAGNOSIS — Z9189 Other specified personal risk factors, not elsewhere classified: Secondary | ICD-10-CM | POA: Insufficient documentation

## 2021-01-21 LAB — POC URINALSYSI DIPSTICK (AUTOMATED)
Bilirubin, UA: NEGATIVE
Glucose, UA: NEGATIVE
Ketones, UA: NEGATIVE
Nitrite, UA: NEGATIVE
Protein, UA: POSITIVE — AB
Spec Grav, UA: >=1.03 — AB (ref 1.010–1.025)
Urobilinogen, UA: 0.2 E.U./dL
pH, UA: 6 (ref 5.0–8.0)

## 2021-01-21 MED ORDER — CEPHALEXIN 500 MG PO CAPS: 500.0000 mg | ORAL_CAPSULE | Freq: Two times a day (BID) | ORAL | 0 refills | Status: DC

## 2021-01-21 MED ORDER — GABAPENTIN 100 MG PO CAPS: 100.0000 mg | ORAL_CAPSULE | Freq: Every day | ORAL | 3 refills | Status: DC | PRN

## 2021-01-21 NOTE — Assessment & Plan Note (Signed)
Acute Here for EKG  - requested by rheumatology  - they may be starting her on hydroxychloroquine and need EKG to evaluate for arrhythmia and QT prolongation  EKG today: NSR at 61 bpm, possible LAE, normal QT, no change from prior EKG from 02/2020

## 2021-01-21 NOTE — Assessment & Plan Note (Signed)
Acute Urine dip consistent with UTI Will send urine for culture Take the antibiotic as prescribed.  Keflex 500 mg BID x 1 week Take tylenol if needed.   Increase your water intake.  Call if no improvement

## 2021-01-21 NOTE — Patient Instructions (Signed)
Take the antibiotic as prescribed.   Take the gabapentin daily and daily as needed.

## 2021-01-21 NOTE — Assessment & Plan Note (Signed)
Chronic Start gabapentin 100 mg daily prn -- advised she take daily if it helps.

## 2021-01-22 NOTE — Telephone Encounter (Signed)
Received EKG results from PCP. Results show Normal Sinus Rhythm and possible Left atrial enlargement. Reviewed by Dr. Estanislado Pandy   Labs resulted: UA shows 2+ protein and elevated leukocytes. No bacteria were seen. Creatinine is elevated, CBC is normal, G6PD WNL  My plan is to start her on hydroxychloroquine 200 mg p.o. twice daily Monday to Friday

## 2021-01-23 LAB — URINE CULTURE

## 2021-01-25 ENCOUNTER — Telehealth: Payer: Self-pay

## 2021-01-25 MED ORDER — HYDROXYCHLOROQUINE SULFATE 200 MG PO TABS
ORAL_TABLET | ORAL | 0 refills | Status: DC
Start: 2021-01-25 — End: 2021-04-06

## 2021-01-25 NOTE — Telephone Encounter (Signed)
Please advise 

## 2021-01-25 NOTE — Telephone Encounter (Signed)
Patient called stating Dr. Estanislado Pandy wanted her to have an EKG and would discuss results prior to starting the Hydroxychloroquine.  Patient states she received a call from [facility] to let her know the prescription was ready for pick up.  Patient states she still hasn't received a call from our office and requested a return call before she starts the medication.

## 2021-01-26 NOTE — Telephone Encounter (Signed)
Ok to start on PLQ.

## 2021-01-26 NOTE — Telephone Encounter (Signed)
Spoke with patient and advised her the EKG is normal and she may start the PLQ per Dr. Estanislado Pandy. Patient advised if she has any sie effects such as GI upset or headaches to contact the office.

## 2021-02-08 ENCOUNTER — Other Ambulatory Visit: Payer: Self-pay | Admitting: Internal Medicine

## 2021-02-16 NOTE — Progress Notes (Deleted)
Office Visit Note  Patient: [name]             Date of Birth: October 29, 1946           MRN: [id number]             PCP: Binnie Rail, MD Referring: Binnie Rail, MD Visit Date: 03/02/2021 Occupation: @GUAROCC @  Subjective:  No chief complaint on file.   History of Present Illness: [name] is a 75 y.o. female ***   Activities of Daily Living:  Patient reports morning stiffness for *** {minute/hour:19697}.   Patient {ACTIONS;DENIES/REPORTS:21021675::"Denies"} nocturnal pain.  Difficulty dressing/grooming: {ACTIONS;DENIES/REPORTS:21021675::"Denies"} Difficulty climbing stairs: {ACTIONS;DENIES/REPORTS:21021675::"Denies"} Difficulty getting out of chair: {ACTIONS;DENIES/REPORTS:21021675::"Denies"} Difficulty using hands for taps, buttons, cutlery, and/or writing: {ACTIONS;DENIES/REPORTS:21021675::"Denies"}  No Rheumatology ROS completed.   PMFS History:  Patient Active Problem List   Diagnosis Date Noted  . At risk for long QT syndrome 01/21/2021  . Acute cystitis without hematuria 01/21/2021  . Lumbar back pain with radiculopathy affecting left lower extremity 08/05/2020  . Atypical chest pain 03/10/2020  . Shingles 11/19/2019  . Pain in both knees 07/09/2019  . Right arm pain 02/26/2019  . Acute pain of left knee 08/27/2018  . 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 Trigger middle finger of right hand 06/13/2018  . Sciatica of left side 01/24/2018  . 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  . Intermittent palpitations 10/11/2017  . Migraine 12/30/2015  . Upper airway cough syndrome 10/29/2015  . Osteoporosis 06/06/2011  . Vitamin D deficiency 03/22/2010  . BREAST CANCER, HX OF 03/22/2010  . SPINAL STENOSIS, LUMBAR 03/25/2008  . HYPERLIPIDEMIA 09/28/2007  . Fibromyalgia 05/24/2007    Past Medical History:  Diagnosis Date  . Arthritis   . Breast cancer ([place])    R lumpectomy ; radiation; no chemotherapy  . Bronchitis    recurrent  . Cataract    multifocal  lens implant  . Chronic low back pain    bulging disc & spinal stenosis  . Fibromyalgia   . Hyperlipemia   . Mitral valve prolapse   . Osteoporosis   . Personal history of radiation therapy   . Pneumonia    as OP X 2; no Pneumovax  . Vitamin D deficiency    18 in  01/2009    Family History  Problem Relation Age of Onset  . Atrial fibrillation Mother        S/P cardioversion  . Ulcers Mother   . Hypertension Mother   . Dementia Mother   . Heart attack Father 15       died @ 39  . Heart attack Paternal Aunt        < 26  . Heart attack Paternal Grandfather        > 55  . Heart attack Paternal Uncle        > 55  . Heart attack Paternal Grandmother        < 34  . Hypertension Brother   . Thyroid disease Daughter        on Synthroid since high school  . Diabetes Brother   . Healthy Son   . Stroke Neg Hx   . Breast cancer Neg Hx    Past Surgical History:  Procedure Laterality Date  . ABDOMINAL HYSTERECTOMY  1990   USO for fibroid , Dr Maryruth Eve  . BREAST LUMPECTOMY Right   . BREAST SURGERY  2010   R side lumpectomy; Dr Margot Chimes  . CATARACT EXTRACTION, BILATERAL  2017  .  CESAREAN SECTION      G 2 P 2  . CYSTECTOMY  2010  . EYE SURGERY     eye lid surgery  . FOOT SURGERY Right   . no colonoscopy     "I just don't want to do it". SOC reviewed   Social History   Social History Narrative  . Not on file   Immunization History  Administered Date(s) Administered  . Fluad Quad(high Dose 65+) 10/02/2019  . PFIZER(Purple Top)SARS-COV-2 Vaccination 01/08/2020, 01/27/2020, 10/17/2020  . Td 12/20/1999     Objective: Vital Signs: There were no vitals taken for this visit.   Physical Exam   Musculoskeletal Exam: ***  CDAI Exam: CDAI Score: -- Patient Global: --; Provider Global: -- Swollen: --; Tender: -- Joint Exam 03/02/2021   No joint exam has been documented for this visit   There is currently no information documented on the homunculus. Go to the  Rheumatology activity and complete the homunculus joint exam.  Investigation: No additional findings.  Imaging: XR Foot 2 Views Left  Result Date: 01/19/2021 First MTP, PIP and DIP narrowing was noted.  None of the other MTP showed narrowing.  No erosive changes were noted.  No intertarsal, tibiotalar or subtalar joint space narrowing was noted.  Posterior calcaneal spur was noted. Impression: These findings are consistent with osteoarthritis of the foot.  XR Foot 2 Views Right  Result Date: 01/19/2021 First MTP, PIP and DIP narrowing was noted.  A screw was present in the fifth metatarsal head.  No intertarsal, tibiotalar or subtalar joint space narrowing was noted.  Posterior calcaneal spur was noted. Impression: These findings are consistent with osteoarthritis of the foot.  XR Hand 2 View Left  Result Date: 01/19/2021 CMC, PIP and DIP narrowing was noted.  Mild third and fourth MCP narrowing was noted.  No intercarpal or radiocarpal joint space narrowing was noted.  Calcification around the [facility] joint was noted.  No radiographic progression was noted when compared to the x-rays of 2019. Impression: These findings are consistent with osteoarthritis and inflammatory arthritis of the hand.   Recent Labs: Lab Results  Component Value Date   WBC 6.6 01/19/2021   HGB 12.5 01/19/2021   PLT 296 01/19/2021   NA 139 01/19/2021   K 4.9 01/19/2021   CL 105 01/19/2021   CO2 26 01/19/2021   GLUCOSE 88 01/19/2021   BUN 25 01/19/2021   CREATININE 1.17 (H) 01/19/2021   BILITOT 0.3 01/19/2021   ALKPHOS 63 04/17/2020   AST 14 01/19/2021   ALT 10 01/19/2021   PROT 6.8 01/19/2021   ALBUMIN 4.6 04/17/2020   CALCIUM 9.4 01/19/2021   GFRAA 54 (L) 01/19/2021    Speciality Comments: No specialty comments available.  Procedures:  No procedures performed Allergies: Morphine, Albuterol, Augmentin [amoxicillin-pot clavulanate], Ciprofloxacin, Fish allergy, Gabapentin, Risedronate sodium,  Cyclobenzaprine, and Ibandronate sodium   Assessment / Plan:     Visit Diagnoses: Autoimmune disease ([place])  High risk medication use  Primary osteoarthritis of right hand  Trigger middle finger of right hand  Primary osteoarthritis of right hip  DDD (degenerative disc disease), lumbar  Age-related osteoporosis without current pathological fracture  Vitamin D deficiency  Fibromyalgia  Intermittent palpitations  History of breast cancer  Hx of migraines  History of shingles  Orders: No orders of the defined types were placed in this encounter.  No orders of the defined types were placed in this encounter.   Face-to-face time spent with patient was *** minutes.  Greater than 50% of time was spent in counseling and coordination of care.  Follow-Up Instructions: No follow-ups on file.   Ofilia Neas, PA-C  Note - This record has been created using Dragon software.  Chart creation errors have been sought, but may not always  have been located. Such creation errors do not reflect on  the standard of medical care.

## 2021-02-24 ENCOUNTER — Encounter: Payer: Self-pay | Admitting: Internal Medicine

## 2021-02-24 ENCOUNTER — Telehealth: Payer: Self-pay | Admitting: Internal Medicine

## 2021-02-24 DIAGNOSIS — E782 Mixed hyperlipidemia: Secondary | ICD-10-CM

## 2021-02-24 DIAGNOSIS — E559 Vitamin D deficiency, unspecified: Secondary | ICD-10-CM

## 2021-02-24 DIAGNOSIS — M810 Age-related osteoporosis without current pathological fracture: Secondary | ICD-10-CM

## 2021-02-24 DIAGNOSIS — Z0000 Encounter for general adult medical examination without abnormal findings: Secondary | ICD-10-CM

## 2021-02-24 NOTE — Telephone Encounter (Signed)
   Patient requesting order  for annual labs 

## 2021-02-24 NOTE — Telephone Encounter (Signed)
Blood work [employer]

## 2021-03-02 ENCOUNTER — Ambulatory Visit: Payer: BC Managed Care – PPO | Admitting: Physician Assistant

## 2021-03-02 DIAGNOSIS — M1611 Unilateral primary osteoarthritis, right hip: Secondary | ICD-10-CM

## 2021-03-02 DIAGNOSIS — Z853 Personal history of malignant neoplasm of breast: Secondary | ICD-10-CM

## 2021-03-02 DIAGNOSIS — M5136 Other intervertebral disc degeneration, lumbar region: Secondary | ICD-10-CM

## 2021-03-02 DIAGNOSIS — Z8619 Personal history of other infectious and parasitic diseases: Secondary | ICD-10-CM

## 2021-03-02 DIAGNOSIS — M65331 Trigger finger, right middle finger: Secondary | ICD-10-CM

## 2021-03-02 DIAGNOSIS — M19041 Primary osteoarthritis, right hand: Secondary | ICD-10-CM

## 2021-03-02 DIAGNOSIS — R002 Palpitations: Secondary | ICD-10-CM

## 2021-03-02 DIAGNOSIS — Z8669 Personal history of other diseases of the nervous system and sense organs: Secondary | ICD-10-CM

## 2021-03-02 DIAGNOSIS — M359 Systemic involvement of connective tissue, unspecified: Secondary | ICD-10-CM

## 2021-03-02 DIAGNOSIS — Z79899 Other long term (current) drug therapy: Secondary | ICD-10-CM

## 2021-03-02 DIAGNOSIS — E559 Vitamin D deficiency, unspecified: Secondary | ICD-10-CM

## 2021-03-02 DIAGNOSIS — M19071 Primary osteoarthritis, right ankle and foot: Secondary | ICD-10-CM

## 2021-03-02 DIAGNOSIS — M797 Fibromyalgia: Secondary | ICD-10-CM

## 2021-03-02 DIAGNOSIS — M810 Age-related osteoporosis without current pathological fracture: Secondary | ICD-10-CM

## 2021-03-02 NOTE — Progress Notes (Signed)
Office Visit Note  Patient: [name]             Date of Birth: April 17, 1946           MRN: [id number]             PCP: Binnie Rail, MD Referring: Binnie Rail, MD Visit Date: 03/03/2021 Occupation: @GUAROCC @  Subjective:  Pain in multiple joints   History of Present Illness: [name] is a 75 y.o. female with history of autoimmune disease, fibromyalgia, and osteoarthritis.  Patient is taking Plaquenil 200 mg 1 tablet by mouth twice daily Monday through Friday.  She is tolerating Plaquenil without any side effects and has not missed any doses.  She was started on Plaquenil after her last office visit on 01/19/2021.  She has not noticed drastic clinical improvement since starting on Plaquenil but does not feel as though her symptoms have worsened.  She continues to have pain, stiffness, and intermittent joint swelling in both hands.  She has persistent comfort in her left shoulder, left hip, left knee joint.  She notices intermittent joint swelling in the left knee. She continues to take tylenol 1,000 mg at bedtime for pain relief.  She continues to have ongoing fatigue but has been sleeping well at night after taking trazodone as prescribed.  She denies any recent rashes or photosensitivity.  She has frontal hair thinning but has not noticed any worsening hair loss. She denies any oral or nasal ulcerations.  She experiences eye dryness but no mouth dryness.  She has intermittent symptoms of Raynaud's, but she has noticed some improvement in her symptoms since starting to wear gloves more often.   She reports last week she fell when trying to walk up a hill covered in pine needles.  She did not have any injures after the fall.  She denies any fractures recently.  She has started to take a "bone pill" from [employer] on a daily basis.     Activities of Daily Living:  Patient reports morning stiffness for 1 hour to several hours Patient Reports nocturnal pain.  Difficulty  dressing/grooming: Reports Difficulty climbing stairs: Denies Difficulty getting out of chair: Reports Difficulty using hands for taps, buttons, cutlery, and/or writing: Reports  Review of Systems  Constitutional: Positive for fatigue.  HENT: Negative for mouth sores, mouth dryness and nose dryness.   Eyes: Positive for dryness. Negative for pain, itching and visual disturbance.  Respiratory: Negative for cough, hemoptysis, shortness of breath and difficulty breathing.   Cardiovascular: Positive for palpitations. Negative for chest pain and swelling in legs/feet.  Gastrointestinal: Negative for abdominal pain, blood in stool, constipation and diarrhea.  Endocrine: Negative for increased urination.  Genitourinary: Negative for painful urination.  Musculoskeletal: Positive for arthralgias, joint pain, joint swelling, myalgias, muscle weakness, morning stiffness, muscle tenderness and myalgias.  Skin: Positive for color change. Negative for rash and redness.  Allergic/Immunologic: Negative for susceptible to infections.  Neurological: Positive for numbness. Negative for dizziness, headaches, memory loss and weakness.  Hematological: Positive for bruising/bleeding tendency. Negative for swollen glands.  Psychiatric/Behavioral: Negative for confusion and sleep disturbance.    PMFS History:  Patient Active Problem List   Diagnosis Date Noted  . At risk for long QT syndrome 01/21/2021  . Acute cystitis without hematuria 01/21/2021  . Lumbar back pain with radiculopathy affecting left lower extremity 08/05/2020  . Atypical chest pain 03/10/2020  . Shingles 11/19/2019  . Pain in both knees 07/09/2019  .  Right arm pain 02/26/2019  . Acute pain of left knee 08/27/2018  . 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 Trigger middle finger of right hand 06/13/2018  . Sciatica of left side 01/24/2018  . 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  . Intermittent palpitations 10/11/2017  . Migraine 12/30/2015  . Upper  airway cough syndrome 10/29/2015  . Osteoporosis 06/06/2011  . Vitamin D deficiency 03/22/2010  . BREAST CANCER, HX OF 03/22/2010  . SPINAL STENOSIS, LUMBAR 03/25/2008  . HYPERLIPIDEMIA 09/28/2007  . Fibromyalgia 05/24/2007    Past Medical History:  Diagnosis Date  . Arthritis   . Breast cancer ([place])    R lumpectomy ; radiation; no chemotherapy  . Bronchitis    recurrent  . Cataract    multifocal lens implant  . Chronic low back pain    bulging disc & spinal stenosis  . Fibromyalgia   . Hyperlipemia   . Mitral valve prolapse   . Osteoporosis   . Personal history of radiation therapy   . Pneumonia    as OP X 2; no Pneumovax  . Vitamin D deficiency    18 in  01/2009    Family History  Problem Relation Age of Onset  . Atrial fibrillation Mother        S/P cardioversion  . Ulcers Mother   . Hypertension Mother   . Dementia Mother   . Heart attack Father 77       died @ 76  . Heart attack Paternal Aunt        < 21  . Heart attack Paternal Grandfather        > 55  . Heart attack Paternal Uncle        > 55  . Heart attack Paternal Grandmother        < 33  . Hypertension Brother   . Thyroid disease Daughter        on Synthroid since high school  . Diabetes Brother   . Healthy Son   . Stroke Neg Hx   . Breast cancer Neg Hx    Past Surgical History:  Procedure Laterality Date  . ABDOMINAL HYSTERECTOMY  1990   USO for fibroid , Dr Maryruth Eve  . BREAST LUMPECTOMY Right   . BREAST SURGERY  2010   R side lumpectomy; Dr Margot Chimes  . CATARACT EXTRACTION, BILATERAL  2017  . CESAREAN SECTION      G 2 P 2  . CYSTECTOMY  2010  . EYE SURGERY     eye lid surgery  . FOOT SURGERY Right   . no colonoscopy     "I just don't want to do it". SOC reviewed   Social History   Social History Narrative  . Not on file   Immunization History  Administered Date(s) Administered  . Fluad Quad(high Dose 65+) 10/02/2019  . PFIZER(Purple Top)SARS-COV-2 Vaccination 01/08/2020,  01/27/2020, 10/17/2020  . Td 12/20/1999     Objective: Vital Signs: BP 114/79 (BP Location: Left Arm, Patient Position: Sitting, Cuff Size: Normal)   Pulse 64   Ht 5\' 5"  (1.651 m)   Wt 131 lb 12.8 oz (59.8 kg)   BMI 21.93 kg/m    Physical Exam Vitals and nursing note reviewed.  Constitutional:      Appearance: She is well-developed.  HENT:     Head: Normocephalic and atraumatic.  Eyes:     Conjunctiva/sclera: Conjunctivae normal.  Pulmonary:     Effort: Pulmonary effort is normal.  Abdominal:     Palpations: Abdomen  is soft.  Musculoskeletal:     Cervical back: Normal range of motion.  Lymphadenopathy:     Cervical: No cervical adenopathy.  Skin:    General: Skin is warm and dry.     Capillary Refill: Capillary refill takes less than 2 seconds.     Comments: Frontal hair thinning noted   Neurological:     Mental Status: She is alert and oriented to person, place, and time.  Psychiatric:        Behavior: Behavior normal.      Musculoskeletal Exam: C-spine, thoracic spine, and lumbar spine good ROM.  Shoulder joints good ROM with discomfort in the left shoulder.  Elbow joints and wrist joints good ROM with no discomfort.  Synovitis over the right 2nd MCP joint. Painful ROM of the left hip.  Tenderness over bilateral trochanteric bursa.  Knee joints good ROM with discomfort in the left knee.  Fullness in the left knee but no effusion or warmth was noted.  Ankle joints good ROM with no tenderness or swelling.   CDAI Exam: CDAI Score: -- Patient Global: --; Provider Global: -- Swollen: --; Tender: -- Joint Exam 03/03/2021   No joint exam has been documented for this visit   There is currently no information documented on the homunculus. Go to the Rheumatology activity and complete the homunculus joint exam.  Investigation: No additional findings.  Imaging: No results found.  Recent Labs: Lab Results  Component Value Date   WBC 6.6 01/19/2021   HGB 12.5  01/19/2021   PLT 296 01/19/2021   NA 139 01/19/2021   K 4.9 01/19/2021   CL 105 01/19/2021   CO2 26 01/19/2021   GLUCOSE 88 01/19/2021   BUN 25 01/19/2021   CREATININE 1.17 (H) 01/19/2021   BILITOT 0.3 01/19/2021   ALKPHOS 63 04/17/2020   AST 14 01/19/2021   ALT 10 01/19/2021   PROT 6.8 01/19/2021   ALBUMIN 4.6 04/17/2020   CALCIUM 9.4 01/19/2021   GFRAA 54 (L) 01/19/2021    Speciality Comments: No specialty comments available.  Procedures:  No procedures performed Allergies: Morphine, Albuterol, Augmentin [amoxicillin-pot clavulanate], Ciprofloxacin, Fish allergy, Gabapentin, Risedronate sodium, Cyclobenzaprine, and Ibandronate sodium   Assessment / Plan:     Visit Diagnoses: Autoimmune disease ([place]): ANA 1: 283, all other autoimmune labs were negative, dry eyes, Raynaud's phenomenon, inflammatory arthritis and hair loss: She was started on Plaquenil 200 mg 1 tablet by mouth twice daily Monday through Friday after her last office visit on 01/19/2021.  She has been tolerating Plaquenil without any side effects and has not missed any doses recently.  She has yet to notice any clinical improvement in her symptoms but has not noticed any worsening of symptoms since initiating Plaquenil.  She continues to have pain and stiffness in multiple joints including the left shoulder, left hip, left knee, and both hands.  Frontal hair thinning was noted.  She has not had any recent rashes or photosensitivity.  She continues to have intermittent symptoms of Raynaud's phenomenon but her symptoms have been less frequent since wearing gloves on a regular basis.  No signs of sclerodactyly were noted.  She continues to have persistent eye dryness but has not had any mouth dryness.  No oral or nasal ulcerations.  No cervical lymphadenopathy was palpable.  She has not had any pleuritic chest pain or shortness of breath.  She will continue taking Plaquenil as prescribed.  She will follow-up in the office in about  3 months to  reassess her full response to Plaquenil.  We will recheck autoimmune lab work at that time.  High risk medication use - Started on PLQ after her last OV on 01/19/21.  She is taking Plaquenil 200 mg 1 tablet by mouth twice daily Monday through Friday.  Baseline EKG performed on 01/21/2021.  Normal QT interval.  Normal sinus rhythm.  She has a baseline eye exam scheduled in several months.  CBC and CMP were drawn on 01/19/2021.  She is due to update lab work.  Orders for CBC and CMP were released.  Her next lab work will be due in 3 months and every 5 months.- Plan: CBC with Differential/Platelet, COMPLETE METABOLIC PANEL WITH GFR  Fibromyalgia: She experiences intermittent myalgias and muscle tenderness.  She continues to have chronic fatigue despite sleeping well at night.  She has been taking trazodone 50 mg 3 tablets by mouth at bedtime.  Primary osteoarthritis of both hands - X-rays updated on 01/19/21 and did not reveal any radiographic progression compared to x-rays from 2019.  She continues to have pain and stiffness in both hands especially her right hand.  She has tenderness and synovitis over the right second MCP joint.  PIP and DIP thickening consistent with osteoarthritis of both hands was noted.  She continues to take Tylenol 1000 mg at bedtime for pain relief.  Trigger middle finger of right hand: Intermittent symptoms.   Primary osteoarthritis of right hip: She has chronic pain in both hip joints.  According to the patient her discomfort is most severe in the left hip joint at this time.  She has discomfort with range of motion on exam.  Primary osteoarthritis of both feet: She is not having any discomfort in her feet currently. She is wearing proper fitting shoes.  She walks on a regular basis for exercise.   DDD (degenerative disc disease), lumbar - Facet joint arthropathy, followed by Dr. Lorin Mercy.  Age-related osteoporosis without current pathological fracture - Followed by her  PCP.  Intolerence to Boniva and Fosamax per patient.  She recently started taking a "bone pill" from [employer] for overall bone health. She has not had any recent fractures.   Vitamin D deficiency: She is taking vitamin D 1000 units daily.  Other medical conditions are listed as follows:  Intermittent palpitations - Baseline EKG?  History of breast cancer - 2010, lumpectomy, RTX , no CTX  Hx of migraines  History of shingles  Orders: Orders Placed This Encounter  Procedures  . CBC with Differential/Platelet  . COMPLETE METABOLIC PANEL WITH GFR   No orders of the defined types were placed in this encounter.     Follow-Up Instructions: Return in about 3 months (around 06/03/2021) for Autoimmune Disease.   Ofilia Neas, PA-C  Note - This record has been created using Dragon software.  Chart creation errors have been sought, but may not always  have been located. Such creation errors do not reflect on  the standard of medical care.

## 2021-03-03 ENCOUNTER — Other Ambulatory Visit: Payer: Self-pay

## 2021-03-03 ENCOUNTER — Ambulatory Visit (INDEPENDENT_AMBULATORY_CARE_PROVIDER_SITE_OTHER): Payer: BC Managed Care – PPO | Admitting: Physician Assistant

## 2021-03-03 ENCOUNTER — Encounter: Payer: Self-pay | Admitting: Physician Assistant

## 2021-03-03 VITALS — BP 114/79 | HR 64 | Ht 65.0 in | Wt 131.8 lb

## 2021-03-03 DIAGNOSIS — Z79899 Other long term (current) drug therapy: Secondary | ICD-10-CM | POA: Diagnosis not present

## 2021-03-03 DIAGNOSIS — M19041 Primary osteoarthritis, right hand: Secondary | ICD-10-CM | POA: Diagnosis not present

## 2021-03-03 DIAGNOSIS — M19072 Primary osteoarthritis, left ankle and foot: Secondary | ICD-10-CM

## 2021-03-03 DIAGNOSIS — M359 Systemic involvement of connective tissue, unspecified: Secondary | ICD-10-CM | POA: Diagnosis not present

## 2021-03-03 DIAGNOSIS — M810 Age-related osteoporosis without current pathological fracture: Secondary | ICD-10-CM

## 2021-03-03 DIAGNOSIS — M19071 Primary osteoarthritis, right ankle and foot: Secondary | ICD-10-CM

## 2021-03-03 DIAGNOSIS — R002 Palpitations: Secondary | ICD-10-CM

## 2021-03-03 DIAGNOSIS — M65331 Trigger finger, right middle finger: Secondary | ICD-10-CM

## 2021-03-03 DIAGNOSIS — M797 Fibromyalgia: Secondary | ICD-10-CM | POA: Diagnosis not present

## 2021-03-03 DIAGNOSIS — M19042 Primary osteoarthritis, left hand: Secondary | ICD-10-CM

## 2021-03-03 DIAGNOSIS — E559 Vitamin D deficiency, unspecified: Secondary | ICD-10-CM

## 2021-03-03 DIAGNOSIS — M1611 Unilateral primary osteoarthritis, right hip: Secondary | ICD-10-CM

## 2021-03-03 DIAGNOSIS — Z853 Personal history of malignant neoplasm of breast: Secondary | ICD-10-CM

## 2021-03-03 DIAGNOSIS — M5136 Other intervertebral disc degeneration, lumbar region: Secondary | ICD-10-CM

## 2021-03-03 DIAGNOSIS — Z8669 Personal history of other diseases of the nervous system and sense organs: Secondary | ICD-10-CM

## 2021-03-03 DIAGNOSIS — Z8619 Personal history of other infectious and parasitic diseases: Secondary | ICD-10-CM

## 2021-03-03 LAB — COMPLETE METABOLIC PANEL WITH GFR
AG Ratio: 1.9 (calc) (ref 1.0–2.5)
ALT: 11 U/L (ref 6–29)
AST: 15 U/L (ref 10–35)
Albumin: 4.5 g/dL (ref 3.6–5.1)
Alkaline phosphatase (APISO): 52 U/L (ref 37–153)
BUN/Creatinine Ratio: 18 (calc) (ref 6–22)
BUN: 19 mg/dL (ref 7–25)
CO2: 23 mmol/L (ref 20–32)
Calcium: 9.7 mg/dL (ref 8.6–10.4)
Chloride: 105 mmol/L (ref 98–110)
Creat: 1.04 mg/dL — ABNORMAL HIGH (ref 0.60–0.93)
GFR, Est African American: 61 mL/min/{1.73_m2} (ref >=60)
GFR, Est Non African American: 53 mL/min/{1.73_m2} — ABNORMAL LOW (ref >=60)
Globulin: 2.4 g/dL (calc) (ref 1.9–3.7)
Glucose, Bld: 85 mg/dL (ref 65–99)
Potassium: 4.5 mmol/L (ref 3.5–5.3)
Sodium: 139 mmol/L (ref 135–146)
Total Bilirubin: 0.5 mg/dL (ref 0.2–1.2)
Total Protein: 6.9 g/dL (ref 6.1–8.1)

## 2021-03-03 LAB — CBC WITH DIFFERENTIAL/PLATELET
Absolute Monocytes: 432 cells/uL (ref 200–950)
Basophils Absolute: 59 cells/uL (ref 0–200)
Basophils Relative: 1.3 %
Eosinophils Absolute: 203 cells/uL (ref 15–500)
Eosinophils Relative: 4.5 %
HCT: 36.9 % (ref 35.0–45.0)
Hemoglobin: 12.4 g/dL (ref 11.7–15.5)
Lymphs Abs: 1170 cells/uL (ref 850–3900)
MCH: 30.7 pg (ref 27.0–33.0)
MCHC: 33.6 g/dL (ref 32.0–36.0)
MCV: 91.3 fL (ref 80.0–100.0)
MPV: 9.8 fL (ref 7.5–12.5)
Monocytes Relative: 9.6 %
Neutro Abs: 2637 cells/uL (ref 1500–7800)
Neutrophils Relative %: 58.6 %
Platelets: 274 10*3/uL (ref 140–400)
RBC: 4.04 10*6/uL (ref 3.80–5.10)
RDW: 12.1 % (ref 11.0–15.0)
Total Lymphocyte: 26 %
WBC: 4.5 10*3/uL (ref 3.8–10.8)

## 2021-03-04 NOTE — Progress Notes (Signed)
Creatinine is elevated-1.04 and GFR is slightly low-53 but improved. Please advise the patient to avoid NSAIDs.  Rest of CMP WNL.  CBC WNL.

## 2021-03-10 ENCOUNTER — Other Ambulatory Visit: Payer: Self-pay | Admitting: Internal Medicine

## 2021-03-13 DIAGNOSIS — Z20822 Contact with and (suspected) exposure to covid-19: Secondary | ICD-10-CM | POA: Diagnosis not present

## 2021-03-30 ENCOUNTER — Encounter: Payer: BC Managed Care – PPO | Admitting: Internal Medicine

## 2021-04-06 ENCOUNTER — Other Ambulatory Visit: Payer: Self-pay | Admitting: *Deleted

## 2021-04-06 ENCOUNTER — Telehealth: Payer: Self-pay | Admitting: *Deleted

## 2021-04-06 ENCOUNTER — Other Ambulatory Visit: Payer: Self-pay | Admitting: Physician Assistant

## 2021-04-06 DIAGNOSIS — Z79899 Other long term (current) drug therapy: Secondary | ICD-10-CM

## 2021-04-06 DIAGNOSIS — M359 Systemic involvement of connective tissue, unspecified: Secondary | ICD-10-CM

## 2021-04-06 NOTE — Progress Notes (Signed)
For post Covid exposure:   Date of Symptom Onset: 03/26/2021 Medical Conditions: Autoimmune Disease Is patient receiving IV therapy for chronic conditions? no Has patient had an organ transplant or bone marrow transplant in the last 6 month? no Vaccination Status: Vaccinated Name of Vaccine Received: [place] Number of doses: 3  Patient advised it may take up to 48 hours for the infusion center to contact them if (he/she) qualifies. Patient advised if symptoms worsen to go to the emergency room for evaluation.

## 2021-04-06 NOTE — Telephone Encounter (Signed)
Last Visit: 03/03/2021 Next Visit: 06/02/2021 Labs: 03/03/2021, Creatinine is elevated-1.04 and GFR is slightly low-53 but improved. Please advise the patient to avoid NSAIDs. Rest of CMP WNL. CBC WNL.  Eye exam:  [facility] eye exam due  Current Dose per office note 03/03/2021, Plaquenil 200 mg 1 tablet by mouth twice daily Monday through Friday  DX: Autoimmune disease   Last Fill: 01/25/2021  Okay to refill Plaquenil?

## 2021-04-06 NOTE — Telephone Encounter (Signed)
Refill of PLQ was sent to the pharmacy.    She may not be eligible for the infusion since she tested positive 1 week ago.  Please clarify if her symptoms are improving.

## 2021-04-06 NOTE — Telephone Encounter (Signed)
Patient went to [place] and returned home last night, she tested positive for COVID x 1 week, patient ran out of PLQ while in [place], patient to restart PLQ twice daily? Refer for antibody infusion?

## 2021-04-07 ENCOUNTER — Encounter: Payer: BC Managed Care – PPO | Admitting: Internal Medicine

## 2021-04-07 ENCOUNTER — Telehealth: Payer: Self-pay | Admitting: Physician Assistant

## 2021-04-07 NOTE — Telephone Encounter (Signed)
Called to discuss with patient about Covid symptoms and the use of a monoclonal antibody infusion for those with mild to moderate Covid symptoms and at a high risk of hospitalization.   Pt is not qualified for the monoclonal antibody infusion as symptoms started on 03/26/21.  Symptoms tier reviewed as well as criteria for ending isolation. Preventative practices reviewed. Patient verbalized understanding.  [place], [place]  04/07/2021 11:18 AM

## 2021-04-13 DIAGNOSIS — D8989 Other specified disorders involving the immune mechanism, not elsewhere classified: Secondary | ICD-10-CM | POA: Insufficient documentation

## 2021-04-13 NOTE — Progress Notes (Signed)
Subjective:    Patient ID: [name], female    DOB: 1946/03/08, 75 y.o.   MRN: [id number]   This visit occurred during the SARS-CoV-2 public health emergency.  Safety protocols were in place, including screening questions prior to the visit, additional usage of staff PPE, and extensive cleaning of exam room while observing appropriate contact time as indicated for disinfecting solutions.    HPI She is here for a physical exam.   She got covid while in [place].  She gets tired more easily ans she has some congestion.  She feels like she is recovering as expected and has no concerns.  She has no other concerns.   Medications and allergies reviewed with patient and updated if appropriate.  Patient Active Problem List   Diagnosis Date Noted  . Autoimmune disorder ([place]) 04/13/2021  . At risk for long QT syndrome 01/21/2021  . Acute cystitis without hematuria 01/21/2021  . Lumbar back pain with radiculopathy affecting left lower extremity 08/05/2020  . Atypical chest pain 03/10/2020  . Shingles 11/19/2019  . Pain in both knees 07/09/2019  . Right arm pain 02/26/2019  . Acute pain of left knee 08/27/2018  . 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 Trigger middle finger of right hand 06/13/2018  . 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  . Intermittent palpitations 10/11/2017  . Migraine 12/30/2015  . Upper airway cough syndrome 10/29/2015  . Osteoporosis 06/06/2011  . Vitamin D deficiency 03/22/2010  . BREAST CANCER, HX OF 03/22/2010  . SPINAL STENOSIS, LUMBAR 03/25/2008  . HYPERLIPIDEMIA 09/28/2007  . Fibromyalgia 05/24/2007    Current Outpatient Medications on File Prior to Visit  Medication Sig Dispense Refill  . acetaminophen (TYLENOL) 500 MG tablet Take 1,000 mg by mouth daily as needed for mild pain.    Marland Kitchen amLODipine (NORVASC) 5 MG tablet Take 1 tablet (5 mg total) by mouth daily. 90 tablet 3  . cholecalciferol (VITAMIN D3) 25 MCG (1000 UNIT) tablet Take 1,000 Units by mouth  daily.    Marland Kitchen eletriptan (RELPAX) 20 MG tablet TAKE 1 TABLET BY MOUTH EVERY 2 HOURS AS NEEDED FOR HEADACHE OR MIGRAINE. MAY REPEAT IN 2 HOURS IF HEADACHE PERSISTS OR RECURS. 9 tablet 1  . gabapentin (NEURONTIN) 100 MG capsule Take 1 capsule (100 mg total) by mouth daily as needed. 90 capsule 3  . hydroxychloroquine (PLAQUENIL) 200 MG tablet TAKE 1 TABLET BY MOUTH TWICE DAILY MONDAY TO FRIDAY 120 tablet 0  . MAGNESIUM PO Take 1 tablet by mouth daily as needed.    . traZODone (DESYREL) 50 MG tablet TAKE 3 TABLETS(150 MG) BY MOUTH AT BEDTIME 90 tablet 1   No current facility-administered medications on file prior to visit.    Past Medical History:  Diagnosis Date  . Arthritis   . Breast cancer ([place])    R lumpectomy ; radiation; no chemotherapy  . Bronchitis    recurrent  . Cataract    multifocal lens implant  . Chronic low back pain    bulging disc & spinal stenosis  . Fibromyalgia   . Hyperlipemia   . Mitral valve prolapse   . Osteoporosis   . Personal history of radiation therapy   . Pneumonia    as OP X 2; no Pneumovax  . Vitamin D deficiency    18 in  01/2009    Past Surgical History:  Procedure Laterality Date  . ABDOMINAL HYSTERECTOMY  1990   USO for fibroid , Dr Maryruth Eve  . BREAST LUMPECTOMY Right   .  BREAST SURGERY  2010   R side lumpectomy; Dr Margot Chimes  . CATARACT EXTRACTION, BILATERAL  2017  . CESAREAN SECTION      G 2 P 2  . CYSTECTOMY  2010  . EYE SURGERY     eye lid surgery  . FOOT SURGERY Right   . no colonoscopy     "I just don't want to do it". SOC reviewed    Social History   Socioeconomic History  . Marital status: Married    Spouse name: Not on file  . Number of children: Not on file  . Years of education: Not on file  . Highest education level: Not on file  Occupational History  . Not on file  Tobacco Use  . Smoking status: Former Smoker    Quit date: 12/19/1970    Years since quitting: 50.3  . Smokeless tobacco: Never Used  . Tobacco  comment: [id number], up to 3 cigarettes / day  Vaping Use  . Vaping Use: Never used  Substance and Sexual Activity  . Alcohol use: No  . Drug use: No  . Sexual activity: Not on file  Other Topics Concern  . Not on file  Social History Narrative  . Not on file   Social Determinants of Health   Financial Resource Strain: Not on file  Food Insecurity: Not on file  Transportation Needs: Not on file  Physical Activity: Not on file  Stress: Not on file  Social Connections: Not on file    Family History  Problem Relation Age of Onset  . Atrial fibrillation Mother        S/P cardioversion  . Ulcers Mother   . Hypertension Mother   . Dementia Mother   . Heart attack Father 26       died @ 64  . Heart attack Paternal Aunt        < 41  . Heart attack Paternal Grandfather        > 55  . Heart attack Paternal Uncle        > 55  . Heart attack Paternal Grandmother        < 51  . Hypertension Brother   . Thyroid disease Daughter        on Synthroid since high school  . Diabetes Brother   . Healthy Son   . Stroke Neg Hx   . Breast cancer Neg Hx     Review of Systems  Constitutional: Negative for chills and fever.  HENT: Positive for congestion (residual from covid).   Eyes: Positive for visual disturbance (blurry vision - sees eye doctor in june).  Respiratory: Positive for cough (residual from covid). Negative for shortness of breath and wheezing.   Cardiovascular: Positive for palpitations. Negative for chest pain and leg swelling.  Gastrointestinal: Negative for abdominal pain, blood in stool, constipation, diarrhea and nausea.       No gerd  Genitourinary: Negative for dysuria and hematuria.  Musculoskeletal: Positive for arthralgias.  Skin: Negative for rash.       Lateral eyebrows gone  Neurological: Negative for dizziness, light-headedness and headaches.  Psychiatric/Behavioral: Negative for dysphoric mood. The patient is not nervous/anxious.        Objective:    Vitals:   04/14/21 1510  BP: 120/76  Pulse: 73  Temp: 98.4 F (36.9 C)  SpO2: 99%   Filed Weights   Body mass index is 21.93 kg/m.  BP Readings from Last 3 Encounters:  04/14/21 120/76  03/03/21 114/79  01/21/21 120/76    Wt Readings from Last 3 Encounters:  03/03/21 131 lb 12.8 oz (59.8 kg)  01/19/21 133 lb (60.3 kg)  12/28/20 133 lb (60.3 kg)     Physical Exam Constitutional: She appears well-developed and well-nourished. No distress.  HENT:  Head: Normocephalic and atraumatic.  Right Ear: External ear normal. Normal ear canal and TM Left Ear: External ear normal.  Normal ear canal and TM Mouth/Throat: Oropharynx is clear and moist.  Eyes: Conjunctivae and EOM are normal.  Neck: Neck supple. No tracheal deviation present. No thyromegaly present.  No carotid bruit  Cardiovascular: Normal rate, regular rhythm and normal heart sounds.   No murmur heard.  No edema. Pulmonary/Chest: Effort normal and breath sounds normal. No respiratory distress. She has no wheezes. She has no rales.  Breast: deferred   Abdominal: Soft. She exhibits no distension. There is mild diffuse tenderness. No rebound/guarding Lymphadenopathy: She has no cervical adenopathy.  Skin: Skin is warm and dry. She is not diaphoretic.  Psychiatric: She has a normal mood and affect. Her behavior is normal.        Assessment & Plan:   Physical exam: Screening blood work    ordered Immunizations  Declined pneumonia, tdap Colonoscopy  declined Mammo    Up to date  Gyn   n/a Dexa  Due - ordered Eye exams  Up to date  Exercise  Regular - fitness class, walks daily Weight  normal Substance abuse  none      See Problem List for Assessment and Plan of chronic medical problems.

## 2021-04-13 NOTE — Patient Instructions (Addendum)
Blood work was ordered.     Medications changes include :   none     Please followup in 1 year    Health Maintenance, Female Adopting a healthy lifestyle and getting preventive care are important in promoting health and wellness. Ask your health care provider about:  The right schedule for you to have regular tests and exams.  Things you can do on your own to prevent diseases and keep yourself healthy. What should I know about diet, weight, and exercise? Eat a healthy diet  Eat a diet that includes plenty of vegetables, fruits, low-fat dairy products, and lean protein.  Do not eat a lot of foods that are high in solid fats, added sugars, or sodium.   Maintain a healthy weight Body mass index (BMI) is used to identify weight problems. It estimates body fat based on height and weight. Your health care provider can help determine your BMI and help you achieve or maintain a healthy weight. Get regular exercise Get regular exercise. This is one of the most important things you can do for your health. Most adults should:  Exercise for at least 150 minutes each week. The exercise should increase your heart rate and make you sweat (moderate-intensity exercise).  Do strengthening exercises at least twice a week. This is in addition to the moderate-intensity exercise.  Spend less time sitting. Even light physical activity can be beneficial. Watch cholesterol and blood lipids Have your blood tested for lipids and cholesterol at 74 years of age, then have this test every 5 years. Have your cholesterol levels checked more often if:  Your lipid or cholesterol levels are high.  You are older than 74 years of age.  You are at high risk for heart disease. What should I know about cancer screening? Depending on your health history and family history, you may need to have cancer screening at various ages. This may include screening for:  Breast cancer.  Cervical cancer.  Colorectal  cancer.  Skin cancer.  Lung cancer. What should I know about heart disease, diabetes, and high blood pressure? Blood pressure and heart disease  High blood pressure causes heart disease and increases the risk of stroke. This is more likely to develop in people who have high blood pressure readings, are of African descent, or are overweight.  Have your blood pressure checked: ? Every 3-5 years if you are 18-39 years of age. ? Every year if you are 40 years old or older. Diabetes Have regular diabetes screenings. This checks your fasting blood sugar level. Have the screening done:  Once every three years after age 40 if you are at a normal weight and have a low risk for diabetes.  More often and at a younger age if you are overweight or have a high risk for diabetes. What should I know about preventing infection? Hepatitis B If you have a higher risk for hepatitis B, you should be screened for this virus. Talk with your health care provider to find out if you are at risk for hepatitis B infection. Hepatitis C Testing is recommended for:  Everyone born from 1945 through 1965.  Anyone with known risk factors for hepatitis C. Sexually transmitted infections (STIs)  Get screened for STIs, including gonorrhea and chlamydia, if: ? You are sexually active and are younger than 74 years of age. ? You are older than 74 years of age and your health care provider tells you that you are at risk for this type of   infection. ? Your sexual activity has changed since you were last screened, and you are at increased risk for chlamydia or gonorrhea. Ask your health care provider if you are at risk.  Ask your health care provider about whether you are at high risk for HIV. Your health care provider may recommend a prescription medicine to help prevent HIV infection. If you choose to take medicine to prevent HIV, you should first get tested for HIV. You should then be tested every 3 months for as long as  you are taking the medicine. Pregnancy  If you are about to stop having your period (premenopausal) and you may become pregnant, seek counseling before you get pregnant.  Take 400 to 800 micrograms (mcg) of folic acid every day if you become pregnant.  Ask for birth control (contraception) if you want to prevent pregnancy. Osteoporosis and menopause Osteoporosis is a disease in which the bones lose minerals and strength with aging. This can result in bone fractures. If you are 65 years old or older, or if you are at risk for osteoporosis and fractures, ask your health care provider if you should:  Be screened for bone loss.  Take a calcium or vitamin D supplement to lower your risk of fractures.  Be given hormone replacement therapy (HRT) to treat symptoms of menopause. Follow these instructions at home: Lifestyle  Do not use any products that contain nicotine or tobacco, such as cigarettes, e-cigarettes, and chewing tobacco. If you need help quitting, ask your health care provider.  Do not use street drugs.  Do not share needles.  Ask your health care provider for help if you need support or information about quitting drugs. Alcohol use  Do not drink alcohol if: ? Your health care provider tells you not to drink. ? You are pregnant, may be pregnant, or are planning to become pregnant.  If you drink alcohol: ? Limit how much you use to 0-1 drink a day. ? Limit intake if you are breastfeeding.  Be aware of how much alcohol is in your drink. In the [place] one drink equals one 12 oz bottle of beer (355 mL), one 5 oz glass of wine (148 mL), or one 1 oz glass of hard liquor (44 mL). General instructions  Schedule regular health, dental, and eye exams.  Stay current with your vaccines.  Tell your health care provider if: ? You often feel depressed. ? You have ever been abused or do not feel safe at home. Summary  Adopting a healthy lifestyle and getting preventive care are  important in promoting health and wellness.  Follow your health care provider's instructions about healthy diet, exercising, and getting tested or screened for diseases.  Follow your health care provider's instructions on monitoring your cholesterol and blood pressure. This information is not intended to replace advice given to you by your health care provider. Make sure you discuss any questions you have with your health care provider. Document Revised: 11/28/2018 Document Reviewed: 11/28/2018 Elsevier Patient Education  2021 Elsevier Inc.  

## 2021-04-14 ENCOUNTER — Ambulatory Visit (INDEPENDENT_AMBULATORY_CARE_PROVIDER_SITE_OTHER): Payer: BC Managed Care – PPO | Admitting: Internal Medicine

## 2021-04-14 ENCOUNTER — Encounter: Payer: Self-pay | Admitting: Internal Medicine

## 2021-04-14 ENCOUNTER — Other Ambulatory Visit: Payer: Self-pay

## 2021-04-14 VITALS — BP 120/76 | HR 73 | Temp 98.4°F | Ht 65.0 in

## 2021-04-14 DIAGNOSIS — L659 Nonscarring hair loss, unspecified: Secondary | ICD-10-CM | POA: Diagnosis not present

## 2021-04-14 DIAGNOSIS — Z0000 Encounter for general adult medical examination without abnormal findings: Secondary | ICD-10-CM | POA: Diagnosis not present

## 2021-04-14 DIAGNOSIS — E559 Vitamin D deficiency, unspecified: Secondary | ICD-10-CM

## 2021-04-14 DIAGNOSIS — G43909 Migraine, unspecified, not intractable, without status migrainosus: Secondary | ICD-10-CM

## 2021-04-14 DIAGNOSIS — M797 Fibromyalgia: Secondary | ICD-10-CM

## 2021-04-14 DIAGNOSIS — D8989 Other specified disorders involving the immune mechanism, not elsewhere classified: Secondary | ICD-10-CM

## 2021-04-14 DIAGNOSIS — M810 Age-related osteoporosis without current pathological fracture: Secondary | ICD-10-CM

## 2021-04-14 DIAGNOSIS — E782 Mixed hyperlipidemia: Secondary | ICD-10-CM

## 2021-04-14 DIAGNOSIS — U071 COVID-19: Secondary | ICD-10-CM

## 2021-04-14 LAB — TSH: TSH: 3.27 u[IU]/mL (ref 0.35–4.50)

## 2021-04-14 LAB — LIPID PANEL
Cholesterol: 197 mg/dL (ref 0–200)
HDL: 56.2 mg/dL (ref >39.00)
LDL Cholesterol: 111 mg/dL — ABNORMAL HIGH (ref 0–99)
NonHDL: 141.2
Total CHOL/HDL Ratio: 4
Triglycerides: 150 mg/dL — ABNORMAL HIGH (ref 0.0–149.0)
VLDL: 30 mg/dL (ref 0.0–40.0)

## 2021-04-14 LAB — VITAMIN B12: Vitamin B-12: 155 pg/mL — ABNORMAL LOW (ref 211–911)

## 2021-04-14 LAB — VITAMIN D 25 HYDROXY (VIT D DEFICIENCY, FRACTURES): VITD: 51.14 ng/mL (ref 30.00–100.00)

## 2021-04-14 NOTE — Assessment & Plan Note (Signed)
Chronic Diet controlled Check lipid panel She does not want to go on medication

## 2021-04-14 NOTE — Assessment & Plan Note (Signed)
Chronic DEXA due-ordered Continue regular exercise Continue Vitamin D daily Check vitamin D level

## 2021-04-14 NOTE — Assessment & Plan Note (Signed)
Chronic Taking vitamin D daily Check vitamin D level  

## 2021-04-14 NOTE — Assessment & Plan Note (Signed)
Chronic Is frequent migraines Takes Relpax as needed, which has not been needed for a while

## 2021-04-14 NOTE — Assessment & Plan Note (Signed)
Had recent COVID infection Recovering well Symptomatic treatment only

## 2021-04-14 NOTE — Assessment & Plan Note (Signed)
Chronic Continue gabapentin 100 mg daily as needed

## 2021-04-15 ENCOUNTER — Encounter: Payer: Self-pay | Admitting: Internal Medicine

## 2021-04-15 DIAGNOSIS — E538 Deficiency of other specified B group vitamins: Secondary | ICD-10-CM | POA: Insufficient documentation

## 2021-04-23 ENCOUNTER — Ambulatory Visit (INDEPENDENT_AMBULATORY_CARE_PROVIDER_SITE_OTHER)
Admission: RE | Admit: 2021-04-23 | Discharge: 2021-04-23 | Disposition: A | Discharge status: Home / Self Care | Payer: BC Managed Care – PPO | Source: Ambulatory Visit | Attending: Internal Medicine | Admitting: Internal Medicine

## 2021-04-23 ENCOUNTER — Other Ambulatory Visit: Payer: Self-pay

## 2021-04-23 DIAGNOSIS — M810 Age-related osteoporosis without current pathological fracture: Secondary | ICD-10-CM | POA: Diagnosis not present

## 2021-04-28 DIAGNOSIS — L649 Androgenic alopecia, unspecified: Secondary | ICD-10-CM | POA: Diagnosis not present

## 2021-04-28 DIAGNOSIS — L659 Nonscarring hair loss, unspecified: Secondary | ICD-10-CM | POA: Diagnosis not present

## 2021-05-15 DIAGNOSIS — S322XXA Fracture of coccyx, initial encounter for closed fracture: Secondary | ICD-10-CM

## 2021-05-15 HISTORY — DX: Fracture of coccyx, initial encounter for closed fracture: S32.2XXA

## 2021-05-15 NOTE — Progress Notes (Deleted)
Office Visit Note  Patient: [name]             Date of Birth: 05-02-1946           MRN: [id number]             PCP: Binnie Rail, MD Referring: Binnie Rail, MD Visit Date: 05/24/2021 Occupation: @GUAROCC @  Subjective:  Other (Patient reports recent coccyx fracture and also reports trigger finger right middle finger )   History of Present Illness: ROLENE Santana is a 75 y.o. female ***   Activities of Daily Living:  Patient reports morning stiffness for *** {minute/hour:19697}.   Patient {ACTIONS;DENIES/REPORTS:21021675::"Denies"} nocturnal pain.  Difficulty dressing/grooming: {ACTIONS;DENIES/REPORTS:21021675::"Denies"} Difficulty climbing stairs: {ACTIONS;DENIES/REPORTS:21021675::"Denies"} Difficulty getting out of chair: {ACTIONS;DENIES/REPORTS:21021675::"Denies"} Difficulty using hands for taps, buttons, cutlery, and/or writing: {ACTIONS;DENIES/REPORTS:21021675::"Denies"}  No Rheumatology ROS completed.   PMFS History:  Patient Active Problem List   Diagnosis Date Noted  . Coccyx pain 05/19/2021  . Abrasion 05/19/2021  . B12 deficiency 04/15/2021  . COVID-19 04/14/2021  . Autoimmune disorder ([place]) 04/13/2021  . At risk for long QT syndrome 01/21/2021  . Acute cystitis without hematuria 01/21/2021  . Lumbar back pain with radiculopathy affecting left lower extremity 08/05/2020  . Atypical chest pain 03/10/2020  . Shingles 11/19/2019  . Pain in both knees 07/09/2019  . Right arm pain 02/26/2019  . Acute pain of left knee 08/27/2018  . 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 Trigger middle finger of right hand 06/13/2018  . 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  . Intermittent palpitations 10/11/2017  . Migraine 12/30/2015  . Upper airway cough syndrome 10/29/2015  . Osteoporosis 06/06/2011  . Vitamin D deficiency 03/22/2010  . BREAST CANCER, HX OF 03/22/2010  . SPINAL STENOSIS, LUMBAR 03/25/2008  . HYPERLIPIDEMIA 09/28/2007  . Fibromyalgia 05/24/2007     Past Medical History:  Diagnosis Date  . Arthritis   . Breast cancer ([place])    R lumpectomy ; radiation; no chemotherapy  . Bronchitis    recurrent  . Cataract    multifocal lens implant  . Chronic low back pain    bulging disc & spinal stenosis  . Coccygeal fracture ([place]) 05/15/2021  . COVID-19   . Fibromyalgia   . Hyperlipemia   . Mitral valve prolapse   . Osteoporosis   . Personal history of radiation therapy   . Pneumonia    as OP X 2; no Pneumovax  . Vitamin D deficiency    18 in  01/2009    Family History  Problem Relation Age of Onset  . Atrial fibrillation Mother        S/P cardioversion  . Ulcers Mother   . Hypertension Mother   . Dementia Mother   . Heart attack Father 40       died @ 73  . Heart attack Paternal Aunt        < 10  . Heart attack Paternal Grandfather        > 55  . Heart attack Paternal Uncle        > 55  . Heart attack Paternal Grandmother        < 95  . Hypertension Brother   . Thyroid disease Daughter        on Synthroid since high school  . Diabetes Brother   . Healthy Son   . Stroke Neg Hx   . Breast cancer Neg Hx    Past Surgical History:  Procedure Laterality Date  . ABDOMINAL  HYSTERECTOMY  1990   USO for fibroid , Dr Maryruth Eve  . BREAST LUMPECTOMY Right   . BREAST SURGERY  2010   R side lumpectomy; Dr Margot Chimes  . CATARACT EXTRACTION, BILATERAL  2017  . CESAREAN SECTION      G 2 P 2  . CYSTECTOMY  2010  . EYE SURGERY     eye lid surgery  . FOOT SURGERY Right   . no colonoscopy     "I just don't want to do it". SOC reviewed   Social History   Social History Narrative  . Not on file   Immunization History  Administered Date(s) Administered  . Fluad Quad(high Dose 65+) 10/02/2019  . PFIZER(Purple Top)SARS-COV-2 Vaccination 01/08/2020, 01/27/2020, 10/17/2020  . Td 12/20/1999     Objective: Vital Signs: BP 122/80 (BP Location: Left Arm, Patient Position: Sitting, Cuff Size: Normal)   Pulse 69   Resp 13   Ht 5\' 5"   (1.651 m)   Wt 125 lb (56.7 kg) Comment: per patient, patient refused to weigh  BMI 20.80 kg/m    Physical Exam   Musculoskeletal Exam: ***  CDAI Exam: CDAI Score: -- Patient Global: --; Provider Global: -- Swollen: --; Tender: -- Joint Exam 05/24/2021   No joint exam has been documented for this visit   There is currently no information documented on the homunculus. Go to the Rheumatology activity and complete the homunculus joint exam.  Investigation: No additional findings.  Imaging: DG Sacrum/Coccyx  Result Date: 05/19/2021 CLINICAL DATA:  Pain following fall EXAM: SACRUM AND COCCYX - 2+ VIEW COMPARISON:  None. FINDINGS: Frontal, angled frontal, and lateral views obtained. There is an apparent fracture of the mid coccyx with mild anterior displacement of the more inferior fracture fragment with respect to the remainder of the coccyx. No other fracture. No diastasis. Sacroiliac joints appear normal bilaterally. There is degenerative change in the lower lumbar spine. IMPRESSION: Fracture mid coccyx with mild displacement of fracture fragments. No other fracture. No diastasis. Degenerative change lower lumbar spine. These results will be called to the ordering clinician or representative by the Radiologist Assistant, and communication documented in the PACS or [employer]. Electronically Signed   By: Lowella Grip III M.D.   On: 05/19/2021 11:51   DG Hip Unilat W OR W/O Pelvis 2-3 Views Right  Result Date: 05/20/2021 CLINICAL DATA:  Recent fall, lateral hip pain EXAM: DG HIP (WITH OR WITHOUT PELVIS) 2-3V RIGHT COMPARISON:  None. FINDINGS: Normal right hip alignment without acute fracture, subluxation, or dislocation. Degenerative changes of the lower lumbar spine and SI joints. Bony pelvis and hips appear symmetric intact. Nonobstructive bowel gas pattern. Benign pelvic vascular calcification on the right. IMPRESSION: No acute osseous finding. Electronically Signed   By: Jerilynn Mages.   Shick M.D.   On: 05/20/2021 13:23    Recent Labs: Lab Results  Component Value Date   WBC 4.5 03/03/2021   HGB 12.4 03/03/2021   PLT 274 03/03/2021   NA 139 03/03/2021   K 4.5 03/03/2021   CL 105 03/03/2021   CO2 23 03/03/2021   GLUCOSE 85 03/03/2021   BUN 19 03/03/2021   CREATININE 1.04 (H) 03/03/2021   BILITOT 0.5 03/03/2021   ALKPHOS 63 04/17/2020   AST 15 03/03/2021   ALT 11 03/03/2021   PROT 6.9 03/03/2021   ALBUMIN 4.6 04/17/2020   CALCIUM 9.7 03/03/2021   GFRAA 61 03/03/2021    IMPRESSION: Fracture mid coccyx with mild displacement of fracture fragments. No other fracture.  No diastasis. Degenerative change lower lumbar spine.  These results will be called to the ordering clinician or representative by the Radiologist Assistant, and communication documented in the PACS or [employer].   Electronically Signed   By: Lowella Grip III M.D.   On: 05/19/2021 11:51  Speciality Comments: Patient scheduled 05/28/2021  Procedures:  No procedures performed Allergies: Morphine, Albuterol, Augmentin [amoxicillin-pot clavulanate], Ciprofloxacin, Fish allergy, Gabapentin, Risedronate sodium, Cyclobenzaprine, and Ibandronate sodium   Assessment / Plan:     Visit Diagnoses: Autoimmune disease ([place])  High risk medication use  Primary osteoarthritis of both hands  Trigger middle finger of right hand  Primary osteoarthritis of right hip  Primary osteoarthritis of both feet  DDD (degenerative disc disease), lumbar  Closed fracture of coccyx with routine healing, subsequent encounter  Age-related osteoporosis without current pathological fracture - 04/23/21 DEXA scan showed left femoral neck T score -2.7.  Vitamin D deficiency  Fibromyalgia  Intermittent palpitations  History of breast cancer  Hx of migraines  COVID-19 virus infection - 03/2021  Orders: No orders of the defined types were placed in this encounter.  No orders of the defined  types were placed in this encounter.   Face-to-face time spent with patient was *** minutes. Greater than 50% of time was spent in counseling and coordination of care.  Follow-Up Instructions: Return in about 5 months (around 10/24/2021) for Autoimmune disease, Osteoarthritis, Osteoporosis.   Bo Merino, MD  Note - This record has been created using Editor, commissioning.  Chart creation errors have been sought, but may not always  have been located. Such creation errors do not reflect on  the standard of medical care.

## 2021-05-18 ENCOUNTER — Other Ambulatory Visit: Payer: Self-pay

## 2021-05-18 ENCOUNTER — Ambulatory Visit: Payer: BC Managed Care – PPO | Admitting: Internal Medicine
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
## 2021-05-19 ENCOUNTER — Other Ambulatory Visit: Payer: Self-pay | Admitting: Physician Assistant

## 2021-05-19 ENCOUNTER — Encounter: Payer: Self-pay | Admitting: Internal Medicine

## 2021-05-19 ENCOUNTER — Ambulatory Visit (INDEPENDENT_AMBULATORY_CARE_PROVIDER_SITE_OTHER): Payer: BC Managed Care – PPO | Admitting: Internal Medicine

## 2021-05-19 ENCOUNTER — Other Ambulatory Visit: Payer: Self-pay

## 2021-05-19 ENCOUNTER — Telehealth: Payer: Self-pay

## 2021-05-19 ENCOUNTER — Ambulatory Visit (INDEPENDENT_AMBULATORY_CARE_PROVIDER_SITE_OTHER): Payer: BC Managed Care – PPO

## 2021-05-19 VITALS — BP 136/70 | HR 64 | Temp 98.3°F | Ht 65.0 in

## 2021-05-19 DIAGNOSIS — T148XXA Other injury of unspecified body region, initial encounter: Secondary | ICD-10-CM | POA: Diagnosis not present

## 2021-05-19 DIAGNOSIS — M533 Sacrococcygeal disorders, not elsewhere classified: Secondary | ICD-10-CM | POA: Diagnosis not present

## 2021-05-19 MED ORDER — TRAMADOL HCL 50 MG PO TABS: 50.0000 mg | ORAL_TABLET | Freq: Three times a day (TID) | ORAL | 0 refills | Status: DC | PRN

## 2021-05-19 NOTE — Telephone Encounter (Signed)
Result note sent via [facility].

## 2021-05-19 NOTE — Patient Instructions (Signed)
Have an xray downstairs.   Take tylenol with tramadol for your pain..  You can also take gabapentin as needed.

## 2021-05-19 NOTE — Telephone Encounter (Signed)
IMPRESSION: Fracture mid coccyx with mild displacement of fracture fragments. No other fracture. No diastasis. Degenerative change lower lumbar spine.
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
## 2021-05-20 ENCOUNTER — Encounter: Payer: Self-pay | Admitting: Internal Medicine

## 2021-05-20 ENCOUNTER — Ambulatory Visit (INDEPENDENT_AMBULATORY_CARE_PROVIDER_SITE_OTHER): Payer: BC Managed Care – PPO

## 2021-05-20 DIAGNOSIS — M25551 Pain in right hip: Secondary | ICD-10-CM

## 2021-05-24 ENCOUNTER — Ambulatory Visit (INDEPENDENT_AMBULATORY_CARE_PROVIDER_SITE_OTHER): Payer: BC Managed Care – PPO | Admitting: Rheumatology

## 2021-05-24 ENCOUNTER — Ambulatory Visit: Payer: Self-pay

## 2021-05-24 ENCOUNTER — Other Ambulatory Visit: Payer: Self-pay

## 2021-05-24 ENCOUNTER — Encounter: Payer: Self-pay | Admitting: Rheumatology

## 2021-05-24 VITALS — BP 122/80 | HR 69 | Resp 13 | Ht 65.0 in | Wt 125.0 lb

## 2021-05-24 DIAGNOSIS — M65331 Trigger finger, right middle finger: Secondary | ICD-10-CM | POA: Diagnosis not present

## 2021-05-24 DIAGNOSIS — M1611 Unilateral primary osteoarthritis, right hip: Secondary | ICD-10-CM

## 2021-05-24 DIAGNOSIS — M5136 Other intervertebral disc degeneration, lumbar region: Secondary | ICD-10-CM

## 2021-05-24 DIAGNOSIS — M25462 Effusion, left knee: Secondary | ICD-10-CM

## 2021-05-24 DIAGNOSIS — R002 Palpitations: Secondary | ICD-10-CM

## 2021-05-24 DIAGNOSIS — M359 Systemic involvement of connective tissue, unspecified: Secondary | ICD-10-CM

## 2021-05-24 DIAGNOSIS — M810 Age-related osteoporosis without current pathological fracture: Secondary | ICD-10-CM

## 2021-05-24 DIAGNOSIS — M19072 Primary osteoarthritis, left ankle and foot: Secondary | ICD-10-CM

## 2021-05-24 DIAGNOSIS — M19041 Primary osteoarthritis, right hand: Secondary | ICD-10-CM | POA: Diagnosis not present

## 2021-05-24 DIAGNOSIS — M797 Fibromyalgia: Secondary | ICD-10-CM

## 2021-05-24 DIAGNOSIS — M19071 Primary osteoarthritis, right ankle and foot: Secondary | ICD-10-CM

## 2021-05-24 DIAGNOSIS — Z79899 Other long term (current) drug therapy: Secondary | ICD-10-CM

## 2021-05-24 DIAGNOSIS — U071 COVID-19: Secondary | ICD-10-CM

## 2021-05-24 DIAGNOSIS — Z853 Personal history of malignant neoplasm of breast: Secondary | ICD-10-CM

## 2021-05-24 DIAGNOSIS — Z8669 Personal history of other diseases of the nervous system and sense organs: Secondary | ICD-10-CM

## 2021-05-24 DIAGNOSIS — E559 Vitamin D deficiency, unspecified: Secondary | ICD-10-CM

## 2021-05-24 DIAGNOSIS — S322XXD Fracture of coccyx, subsequent encounter for fracture with routine healing: Secondary | ICD-10-CM

## 2021-05-24 DIAGNOSIS — M19042 Primary osteoarthritis, left hand: Secondary | ICD-10-CM

## 2021-05-24 MED ORDER — LIDOCAINE HCL 1 % IJ SOLN
3.0000 mL | INTRAMUSCULAR | Status: AC | PRN
  Administered 2021-05-24: 3 mL

## 2021-05-24 MED ORDER — TRIAMCINOLONE ACETONIDE 40 MG/ML IJ SUSP
60.0000 mg | INTRAMUSCULAR | Status: AC | PRN
  Administered 2021-05-24: 60 mg via INTRA_ARTICULAR

## 2021-05-24 NOTE — Progress Notes (Signed)
Synovial fluid did not show any crystals for gout or pseudo gout. No infection noted.

## 2021-05-24 NOTE — Progress Notes (Signed)
Office Visit Note  Patient: [name]             Date of Birth: 1946-09-26           MRN: [id number]             PCP: Binnie Rail, MD Referring: Binnie Rail, MD Visit Date: 05/24/2021 Occupation: @GUAROCC @  Subjective:  Other (Patient reports recent coccyx fracture and also reports trigger finger right middle finger )   History of Present Illness: [name] is a 75 y.o. female with history of autoimmune disease, inflammatory arthritis and osteoarthritis.  She states that she developed COVID-19 infection while she was in [place] in April.  She recovered from it completely.  About 2 weekends ago she was trying to ride her bike and she could not rotate her left knee joint and fell backwards.  She started having coccyx pain.  She was seen by Dr. Quay Burow and x-rays of her coccyx showed fracture.  She has difficulty sitting and walking.  Activities of Daily Living:  Patient reports morning stiffness for 1 hour.   Patient Reports nocturnal pain.  Difficulty dressing/grooming: Reports Difficulty climbing stairs: Reports Difficulty getting out of chair: Reports Difficulty using hands for taps, buttons, cutlery, and/or writing: Denies  Review of Systems  Constitutional: Positive for fatigue.  HENT: Positive for sore tongue. Negative for mouth sores, mouth dryness and nose dryness.   Eyes: Positive for dryness. Negative for pain and itching.  Respiratory: Negative for shortness of breath and difficulty breathing.   Cardiovascular: Negative for chest pain and palpitations.  Gastrointestinal: Negative for blood in stool, constipation and diarrhea.  Endocrine: Negative for increased urination.  Genitourinary: Negative for difficulty urinating.  Musculoskeletal: Positive for arthralgias, joint pain, joint swelling, myalgias, morning stiffness, muscle tenderness and myalgias.  Skin: Negative for color change, rash and redness.  Allergic/Immunologic: Negative for susceptible  to infections.  Neurological: Negative for dizziness, numbness, headaches, memory loss and weakness.  Hematological: Negative for bruising/bleeding tendency.  Psychiatric/Behavioral: Negative for confusion.    PMFS History:  Patient Active Problem List   Diagnosis Date Noted  . Coccyx pain 05/19/2021  . Abrasion 05/19/2021  . B12 deficiency 04/15/2021  . COVID-19 04/14/2021  . Autoimmune disorder ([place]) 04/13/2021  . At risk for long QT syndrome 01/21/2021  . Acute cystitis without hematuria 01/21/2021  . Lumbar back pain with radiculopathy affecting left lower extremity 08/05/2020  . Atypical chest pain 03/10/2020  . Shingles 11/19/2019  . Pain in both knees 07/09/2019  . Right arm pain 02/26/2019  . Acute pain of left knee 08/27/2018  . 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 Trigger middle finger of right hand 06/13/2018  . 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  . Intermittent palpitations 10/11/2017  . Migraine 12/30/2015  . Upper airway cough syndrome 10/29/2015  . Osteoporosis 06/06/2011  . Vitamin D deficiency 03/22/2010  . BREAST CANCER, HX OF 03/22/2010  . SPINAL STENOSIS, LUMBAR 03/25/2008  . HYPERLIPIDEMIA 09/28/2007  . Fibromyalgia 05/24/2007    Past Medical History:  Diagnosis Date  . Arthritis   . Breast cancer ([place])    R lumpectomy ; radiation; no chemotherapy  . Bronchitis    recurrent  . Cataract    multifocal lens implant  . Chronic low back pain    bulging disc & spinal stenosis  . Coccygeal fracture ([place]) 05/15/2021  . COVID-19   . Fibromyalgia   . Hyperlipemia   . Mitral valve prolapse   . Osteoporosis   .  Personal history of radiation therapy   . Pneumonia    as OP X 2; no Pneumovax  . Vitamin D deficiency    19 in  01/2009    Family History  Problem Relation Age of Onset  . Atrial fibrillation Mother        S/P cardioversion  . Ulcers Mother   . Hypertension Mother   . Dementia Mother   . Heart attack Father 15       died @ 28  . Heart attack  Paternal Aunt        < 68  . Heart attack Paternal Grandfather        > 55  . Heart attack Paternal Uncle        > 55  . Heart attack Paternal Grandmother        < 42  . Hypertension Brother   . Thyroid disease Daughter        on Synthroid since high school  . Diabetes Brother   . Healthy Son   . Stroke Neg Hx   . Breast cancer Neg Hx    Past Surgical History:  Procedure Laterality Date  . ABDOMINAL HYSTERECTOMY  1990   [facility] for fibroid , Dr Maryruth Eve  . BREAST LUMPECTOMY Right   . BREAST SURGERY  2010   R side lumpectomy; Dr Margot Chimes  . CATARACT EXTRACTION, BILATERAL  2017  . CESAREAN SECTION      G 2 P 2  . CYSTECTOMY  2010  . EYE SURGERY     eye lid surgery  . FOOT SURGERY Right   . no colonoscopy     "I just don't want to do it". SOC reviewed   Social History   Social History Narrative  . Not on file   Immunization History  Administered Date(s) Administered  . Fluad Quad(high Dose 65+) 10/02/2019  . PFIZER(Purple Top)SARS-COV-2 Vaccination 01/08/2020, 01/27/2020, 10/17/2020  . Td 12/20/1999     Objective: Vital Signs: BP 122/80 (BP Location: Left Arm, Patient Position: Sitting, Cuff Size: Normal)   Pulse 69   Resp 13   Ht 5\' 5"  (1.651 m)   Wt 125 lb (56.7 kg) Comment: per patient, patient refused to weigh  BMI 20.80 kg/m    Physical Exam Vitals and nursing note reviewed.  Constitutional:      Appearance: She is well-developed.  HENT:     Head: Normocephalic and atraumatic.  Eyes:     Conjunctiva/sclera: Conjunctivae normal.  Cardiovascular:     Rate and Rhythm: Normal rate and regular rhythm.     Heart sounds: Normal heart sounds.  Pulmonary:     Effort: Pulmonary effort is normal.     Breath sounds: Normal breath sounds.  Abdominal:     General: Bowel sounds are normal.     Palpations: Abdomen is soft.  Musculoskeletal:     Cervical back: Normal range of motion.  Lymphadenopathy:     Cervical: No cervical adenopathy.  Skin:    General: Skin  is warm and dry.     Capillary Refill: Capillary refill takes less than 2 seconds.  Neurological:     Mental Status: She is alert and oriented to person, place, and time.  Psychiatric:        Behavior: Behavior normal.      Musculoskeletal Exam: Spine was in good range of motion.  Shoulder joints, elbow joints, wrist joints, MCPs PIPs and DIPs with good range of motion with no synovitis.  Hip joints  with good range of motion.  She had large effusion in her left knee joint with some warmth on palpation.  Right knee joint was in good range of motion.  She had no tenderness over ankles or MTPs.  CDAI Exam: CDAI Score: -- Patient Global: --; Provider Global: -- Swollen: --; Tender: -- Joint Exam 05/24/2021   No joint exam has been documented for this visit   There is currently no information documented on the homunculus. Go to the Rheumatology activity and complete the homunculus joint exam.  Investigation: No additional findings.  Imaging: DG Sacrum/Coccyx  Result Date: 05/19/2021 CLINICAL DATA:  Pain following fall EXAM: SACRUM AND COCCYX - 2+ VIEW COMPARISON:  None. FINDINGS: Frontal, angled frontal, and lateral views obtained. There is an apparent fracture of the mid coccyx with mild anterior displacement of the more inferior fracture fragment with respect to the remainder of the coccyx. No other fracture. No diastasis. Sacroiliac joints appear normal bilaterally. There is degenerative change in the lower lumbar spine. IMPRESSION: Fracture mid coccyx with mild displacement of fracture fragments. No other fracture. No diastasis. Degenerative change lower lumbar spine. These results will be called to the ordering clinician or representative by the Radiologist Assistant, and communication documented in the PACS or [employer]. Electronically Signed   By: Lowella Grip III M.D.   On: 05/19/2021 11:51   DG Hip Unilat W OR W/O Pelvis 2-3 Views Right  Result Date: 05/20/2021 CLINICAL  DATA:  Recent fall, lateral hip pain EXAM: DG HIP (WITH OR WITHOUT PELVIS) 2-3V RIGHT COMPARISON:  None. FINDINGS: Normal right hip alignment without acute fracture, subluxation, or dislocation. Degenerative changes of the lower lumbar spine and SI joints. Bony pelvis and hips appear symmetric intact. Nonobstructive bowel gas pattern. Benign pelvic vascular calcification on the right. IMPRESSION: No acute osseous finding. Electronically Signed   By: Jerilynn Mages.  Shick M.D.   On: 05/20/2021 13:23   XR KNEE 3 VIEW LEFT  Result Date: 05/24/2021 No medial or lateral compartment was noted.  No chondrocalcinosis was noted.  Mild patellofemoral narrowing. 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   Recent Labs: Lab Results  Component Value Date   WBC 4.5 03/03/2021   HGB 12.4 03/03/2021   PLT 274 03/03/2021   NA 139 03/03/2021   K 4.5 03/03/2021   CL 105 03/03/2021   CO2 23 03/03/2021   GLUCOSE 85 03/03/2021   BUN 19 03/03/2021   CREATININE 1.04 (H) 03/03/2021   BILITOT 0.5 03/03/2021   ALKPHOS 63 04/17/2020   AST 15 03/03/2021   ALT 11 03/03/2021   PROT 6.9 03/03/2021   ALBUMIN 4.6 04/17/2020   CALCIUM 9.7 03/03/2021   GFRAA 61 03/03/2021      IMPRESSION: Fracture mid coccyx with mild displacement of fracture fragments. No other fracture. No diastasis. Degenerative change lower lumbar spine.  These results will be called to the ordering clinician or representative by the Radiologist Assistant, and communication documented in the PACS or [employer].   Electronically Signed By: Lowella Grip III M.D. On: 05/19/2021 11:51 Speciality Comments: Patient scheduled 05/28/2021  Procedures:  Large Joint Inj: L knee on 05/24/2021 12:04 PM Indications: pain Details: 27 G 1.5 in needle, medial approach  Arthrogram: No  Medications: 3 mL lidocaine 1 %; 60 mg triamcinolone acetonide 40 MG/ML Aspirate: 14 mL Outcome: tolerated well, no  immediate complications Procedure, treatment alternatives, risks and benefits explained, specific risks discussed. Consent was given by the patient. Immediately prior to procedure  a time out was called to verify the correct patient, procedure, equipment, support staff and site/side marked as required. Patient was prepped and draped in the usual sterile fashion.     Allergies: Morphine, Albuterol, Augmentin [amoxicillin-pot clavulanate], Ciprofloxacin, Fish allergy, Gabapentin, Risedronate sodium, Cyclobenzaprine, and Ibandronate sodium   Assessment / Plan:     Visit Diagnoses: Autoimmune disease ([place]) - ANA 1: 640NS, history of dry eyes, hair loss, Raynaud's, inflammatory arthritis. -She has been on hydroxychloroquine since February 2022.  She has not noticed any side effects.  She states she continues to have pain and swelling in her left knee joint.  Plan: Anti-DNA antibody, double-stranded  High risk medication use -she is on hydroxychloroquine 200 mg p.o. twice daily Monday to Friday.  Hydroxychloroquine was a started on January 19, 2021.  She has not had an eye examination.  She will get eye examination soon.  Plan: CBC with Differential/Platelet, COMPLETE METABOLIC PANEL WITH GFR.  Her labs have been stable.  We will check her labs again today.  Primary osteoarthritis of both hands-she has underlying osteoarthritis.  She continues to have some stiffness.  No synovitis was noted.  Trigger middle finger of right hand-she has intermittent symptoms and does not want cortisone injection at this point.  Primary osteoarthritis of right hip-she continues to have some discomfort in her hip joints.  Effusion, left knee -she has warmth swelling and effusion in her left knee joint today.  She gives history of instability in her knee joint.  Plan: XR KNEE 3 VIEW LEFT, x-ray was unremarkable.  Will obtain following labs today.  Sedimentation rate, Rheumatoid factor, Cyclic citrul peptide antibody, IgG,  Anaerobic and Aerobic Culture, Synovial Fluid Analysis, Complete, Uric acid.  After different treatment options were discussed and side effects were discussed patient was in agreement to proceed with the knee joint aspiration due to large effusion.  After informed consent was obtained left knee joint aspirated as described above.  She tolerated the procedure well.  Synovial fluid was sent for analysis.  Knee joint was also injected with 60 mg of Kenalog.  We will wait on the synovial fluid analysis to make further decisions regarding the treatment.  Primary osteoarthritis of both feet-she had no synovitis on my examination.  DDD (degenerative disc disease), lumbar-she is followed by Dr. Lorin Mercy.  She has history of facet joint arthropathy.  Closed fracture of coccyx with routine healing, subsequent encounter  Age-related osteoporosis without current pathological fracture - 04/23/21 DEXA scan showed left femoral neck T score -2.7.  Intolerance to Fosamax and Boniva in the past.  I had discussion regarding the use of IV Reclast or Prolia.  Patient is not ready to start on medications at this point.  Vitamin D deficiency  Fibromyalgia-she continues to have some discomfort from fibromyalgia.  Intermittent palpitations  History of breast cancer  Hx of migraines  COVID-19 virus infection - 03/2021 while she was [place].  She recovered from [place] 19 infection currently per patient.  Orders: Orders Placed This Encounter  Procedures  . Large Joint Inj  . Anaerobic and Aerobic Culture  . XR KNEE 3 VIEW LEFT  . CBC with Differential/Platelet  . COMPLETE METABOLIC PANEL WITH GFR  . Sedimentation rate  . Rheumatoid factor  . Cyclic citrul peptide antibody, IgG  . Anti-DNA antibody, double-stranded  . Synovial Fluid Analysis, Complete  . Uric acid   No orders of the defined types were placed in this encounter.   Face-to-face time spent  with patient was 40 minutes. Greater than 50% of  time was spent in counseling and coordination of care.  Follow-Up Instructions: Return in about 4 weeks (around 06/21/2021) for Autoimmune disease, Osteoarthritis, Osteoporosis.   Bo Merino, MD  Note - This record has been created using Editor, commissioning.  Chart creation errors have been sought, but may not always  have been located. Such creation errors do not reflect on  the standard of medical care.

## 2021-05-25 LAB — CBC WITH DIFFERENTIAL/PLATELET
Absolute Monocytes: 351 cells/uL (ref 200–950)
Basophils Absolute: 51 cells/uL (ref 0–200)
Basophils Relative: 1.3 %
Eosinophils Absolute: 82 cells/uL (ref 15–500)
Eosinophils Relative: 2.1 %
HCT: 39.3 % (ref 35.0–45.0)
Hemoglobin: 12.7 g/dL (ref 11.7–15.5)
Lymphs Abs: 1057 cells/uL (ref 850–3900)
MCH: 30 pg (ref 27.0–33.0)
MCHC: 32.3 g/dL (ref 32.0–36.0)
MCV: 92.7 fL (ref 80.0–100.0)
MPV: 9.7 fL (ref 7.5–12.5)
Monocytes Relative: 9 %
Neutro Abs: 2360 cells/uL (ref 1500–7800)
Neutrophils Relative %: 60.5 %
Platelets: 304 10*3/uL (ref 140–400)
RBC: 4.24 10*6/uL (ref 3.80–5.10)
RDW: 12.8 % (ref 11.0–15.0)
Total Lymphocyte: 27.1 %
WBC: 3.9 10*3/uL (ref 3.8–10.8)

## 2021-05-25 LAB — COMPLETE METABOLIC PANEL WITH GFR
AG Ratio: 1.9 (calc) (ref 1.0–2.5)
ALT: 19 U/L (ref 6–29)
AST: 17 U/L (ref 10–35)
Albumin: 4.8 g/dL (ref 3.6–5.1)
Alkaline phosphatase (APISO): 59 U/L (ref 37–153)
BUN/Creatinine Ratio: 17 (calc) (ref 6–22)
BUN: 19 mg/dL (ref 7–25)
CO2: 27 mmol/L (ref 20–32)
Calcium: 10 mg/dL (ref 8.6–10.4)
Chloride: 104 mmol/L (ref 98–110)
Creat: 1.09 mg/dL — ABNORMAL HIGH (ref 0.60–0.93)
GFR, Est African American: 58 mL/min/{1.73_m2} — ABNORMAL LOW (ref >=60)
GFR, Est Non African American: 50 mL/min/{1.73_m2} — ABNORMAL LOW (ref >=60)
Globulin: 2.5 g/dL (calc) (ref 1.9–3.7)
Glucose, Bld: 84 mg/dL (ref 65–99)
Potassium: 4.9 mmol/L (ref 3.5–5.3)
Sodium: 139 mmol/L (ref 135–146)
Total Bilirubin: 0.5 mg/dL (ref 0.2–1.2)
Total Protein: 7.3 g/dL (ref 6.1–8.1)

## 2021-05-25 LAB — CYCLIC CITRUL PEPTIDE ANTIBODY, IGG: Cyclic Citrullin Peptide Ab: <16 UNITS

## 2021-05-25 LAB — ANTI-DNA ANTIBODY, DOUBLE-STRANDED: ds DNA Ab: <1 IU/mL

## 2021-05-25 LAB — URIC ACID: Uric Acid, Serum: 5.3 mg/dL (ref 2.5–7.0)

## 2021-05-25 LAB — SEDIMENTATION RATE: Sed Rate: 9 mm/h (ref 0–30)

## 2021-05-25 LAB — RHEUMATOID FACTOR: Rheumatoid fact SerPl-aCnc: <14 IU/mL (ref <14)

## 2021-05-25 NOTE — Progress Notes (Signed)
CBC is normal, CMP shows low renal function which is stable.  Sedimentation rate is normal.  Uric acid is normal.  Test for rheumatoid arthritis and lupus are negative.

## 2021-05-26 ENCOUNTER — Encounter: Payer: Self-pay | Admitting: Internal Medicine

## 2021-05-26 DIAGNOSIS — N1831 Chronic kidney disease, stage 3a: Secondary | ICD-10-CM

## 2021-05-28 DIAGNOSIS — H40013 Open angle with borderline findings, low risk, bilateral: Secondary | ICD-10-CM | POA: Diagnosis not present

## 2021-05-28 DIAGNOSIS — Z79899 Other long term (current) drug therapy: Secondary | ICD-10-CM | POA: Diagnosis not present

## 2021-05-29 LAB — ANAEROBIC AND AEROBIC CULTURE
AER RESULT:: NO GROWTH
GRAM STAIN:: NONE SEEN
MICRO NUMBER:: 11972476
MICRO NUMBER:: 11972477
SPECIMEN QUALITY:: ADEQUATE
SPECIMEN QUALITY:: ADEQUATE

## 2021-05-29 LAB — SYNOVIAL FLUID ANALYSIS, COMPLETE
Basophils, %: 0 %
Eosinophils-Synovial: 0 % (ref 0–2)
Lymphocytes-Synovial Fld: 13 % (ref 0–74)
Monocyte/Macrophage: 68 % (ref 0–69)
Neutrophil, Synovial: 19 % (ref 0–24)
Synoviocytes, %: 0 % (ref 0–15)
WBC, Synovial: 380 cells/uL — ABNORMAL HIGH (ref <150)

## 2021-06-02 ENCOUNTER — Ambulatory Visit: Payer: BC Managed Care – PPO | Admitting: Rheumatology
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
## 2021-06-22 NOTE — Addendum Note (Signed)
Addended by: Binnie Rail on: 06/22/2021 12:03 PM   Modules accepted: Orders

## 2021-06-27 ENCOUNTER — Other Ambulatory Visit: Payer: Self-pay | Admitting: Physician Assistant

## 2021-06-28 NOTE — Telephone Encounter (Signed)
Last Visit: 05/24/2021  Next Visit: 06/30/2021  Labs: 05/24/2021 CBC is normal, CMP shows low renal function which is stable.  Sedimentationrate is normal.  Uric acid is normal.   Eye exam: 05/28/2021 WNL   Current Dose per office note 05/24/2021: hydroxychloroquine 200 mg p.o. twice daily Monday to Friday. FU:[id number] disease  Last Fill: 04/06/2021  Okay to refill Plaquenil?

## 2021-06-30 ENCOUNTER — Other Ambulatory Visit: Payer: Self-pay

## 2021-06-30 ENCOUNTER — Ambulatory Visit (INDEPENDENT_AMBULATORY_CARE_PROVIDER_SITE_OTHER): Payer: BC Managed Care – PPO | Admitting: Physician Assistant

## 2021-06-30 ENCOUNTER — Encounter: Payer: Self-pay | Admitting: Physician Assistant

## 2021-06-30 VITALS — BP 127/82 | HR 60 | Ht 64.0 in | Wt 127.0 lb

## 2021-06-30 DIAGNOSIS — M359 Systemic involvement of connective tissue, unspecified: Secondary | ICD-10-CM

## 2021-06-30 DIAGNOSIS — R002 Palpitations: Secondary | ICD-10-CM

## 2021-06-30 DIAGNOSIS — M810 Age-related osteoporosis without current pathological fracture: Secondary | ICD-10-CM

## 2021-06-30 DIAGNOSIS — L659 Nonscarring hair loss, unspecified: Secondary | ICD-10-CM

## 2021-06-30 DIAGNOSIS — R7989 Other specified abnormal findings of blood chemistry: Secondary | ICD-10-CM

## 2021-06-30 DIAGNOSIS — M25462 Effusion, left knee: Secondary | ICD-10-CM

## 2021-06-30 DIAGNOSIS — M65331 Trigger finger, right middle finger: Secondary | ICD-10-CM | POA: Diagnosis not present

## 2021-06-30 DIAGNOSIS — M19042 Primary osteoarthritis, left hand: Secondary | ICD-10-CM

## 2021-06-30 DIAGNOSIS — U071 COVID-19: Secondary | ICD-10-CM

## 2021-06-30 DIAGNOSIS — M19041 Primary osteoarthritis, right hand: Secondary | ICD-10-CM

## 2021-06-30 DIAGNOSIS — Z79899 Other long term (current) drug therapy: Secondary | ICD-10-CM | POA: Diagnosis not present

## 2021-06-30 DIAGNOSIS — M19072 Primary osteoarthritis, left ankle and foot: Secondary | ICD-10-CM

## 2021-06-30 DIAGNOSIS — S322XXD Fracture of coccyx, subsequent encounter for fracture with routine healing: Secondary | ICD-10-CM

## 2021-06-30 DIAGNOSIS — Z853 Personal history of malignant neoplasm of breast: Secondary | ICD-10-CM

## 2021-06-30 DIAGNOSIS — Z8669 Personal history of other diseases of the nervous system and sense organs: Secondary | ICD-10-CM

## 2021-06-30 DIAGNOSIS — E538 Deficiency of other specified B group vitamins: Secondary | ICD-10-CM

## 2021-06-30 DIAGNOSIS — M19071 Primary osteoarthritis, right ankle and foot: Secondary | ICD-10-CM

## 2021-06-30 DIAGNOSIS — R5383 Other fatigue: Secondary | ICD-10-CM

## 2021-06-30 DIAGNOSIS — M797 Fibromyalgia: Secondary | ICD-10-CM

## 2021-06-30 DIAGNOSIS — E559 Vitamin D deficiency, unspecified: Secondary | ICD-10-CM

## 2021-06-30 DIAGNOSIS — M5136 Other intervertebral disc degeneration, lumbar region: Secondary | ICD-10-CM

## 2021-06-30 DIAGNOSIS — M1611 Unilateral primary osteoarthritis, right hip: Secondary | ICD-10-CM

## 2021-07-01 NOTE — Progress Notes (Signed)
CBC WNL-no anemia noted.  ESR and CK WNL.  Vitamin B12 has improved-continue taking vitamin B12 1000 mcg daily as recommended by Dr. Quay Burow.   Complements WNL.  SPEP pending.

## 2021-07-06 LAB — PROTEIN ELECTROPHORESIS, SERUM, WITH REFLEX
Albumin ELP: 4.4 g/dL (ref 3.8–4.8)
Alpha 1: 0.3 g/dL (ref 0.2–0.3)
Alpha 2: 0.8 g/dL (ref 0.5–0.9)
Beta 2: 0.3 g/dL (ref 0.2–0.5)
Beta Globulin: 0.4 g/dL (ref 0.4–0.6)
Gamma Globulin: 0.7 g/dL — ABNORMAL LOW (ref 0.8–1.7)
Total Protein: 6.9 g/dL (ref 6.1–8.1)

## 2021-07-06 LAB — CBC WITH DIFFERENTIAL/PLATELET
Absolute Monocytes: 387 cells/uL (ref 200–950)
Basophils Absolute: 41 cells/uL (ref 0–200)
Basophils Relative: 0.9 %
Eosinophils Absolute: 41 cells/uL (ref 15–500)
Eosinophils Relative: 0.9 %
HCT: 37.8 % (ref 35.0–45.0)
Hemoglobin: 12.8 g/dL (ref 11.7–15.5)
Lymphs Abs: 1220 cells/uL (ref 850–3900)
MCH: 31.1 pg (ref 27.0–33.0)
MCHC: 33.9 g/dL (ref 32.0–36.0)
MCV: 91.7 fL (ref 80.0–100.0)
MPV: 9.4 fL (ref 7.5–12.5)
Monocytes Relative: 8.6 %
Neutro Abs: 2813 cells/uL (ref 1500–7800)
Neutrophils Relative %: 62.5 %
Platelets: 316 10*3/uL (ref 140–400)
RBC: 4.12 10*6/uL (ref 3.80–5.10)
RDW: 12.9 % (ref 11.0–15.0)
Total Lymphocyte: 27.1 %
WBC: 4.5 10*3/uL (ref 3.8–10.8)

## 2021-07-06 LAB — CK: Total CK: 97 U/L (ref 29–143)

## 2021-07-06 LAB — C3 AND C4
C3 Complement: 134 mg/dL (ref 83–193)
C4 Complement: 36 mg/dL (ref 15–57)

## 2021-07-06 LAB — IFE INTERPRETATION: Immunofix Electr Int: NOT DETECTED

## 2021-07-06 LAB — SEDIMENTATION RATE: Sed Rate: 6 mm/h (ref 0–30)

## 2021-07-06 LAB — VITAMIN B12: Vitamin B-12: 466 pg/mL (ref 200–1100)

## 2021-07-06 NOTE — Progress Notes (Signed)
Please add UPEP.  SPEP consistent with hypogammaglobulinemia.  IFE did not reveal any monoclonal proteins. Discussed results with Dr. Estanislado Pandy.

## 2021-07-07 ENCOUNTER — Telehealth: Payer: Self-pay | Admitting: *Deleted

## 2021-07-07 DIAGNOSIS — Z79899 Other long term (current) drug therapy: Secondary | ICD-10-CM

## 2021-07-07 DIAGNOSIS — M359 Systemic involvement of connective tissue, unspecified: Secondary | ICD-10-CM

## 2021-07-07 NOTE — Telephone Encounter (Signed)
-----   Message from Ofilia Neas, PA-C sent at 07/07/2021 12:05 PM EDT ----- Dr. Estanislado Pandy would like the patient to return to do UPEP so the results can be discussed at her follow up visit.

## 2021-07-07 NOTE — Progress Notes (Signed)
Dr. Estanislado Pandy would like the patient to return to do UPEP so the results can be discussed at her follow up visit.

## 2021-07-08 ENCOUNTER — Other Ambulatory Visit: Payer: Self-pay

## 2021-07-08 DIAGNOSIS — Z79899 Other long term (current) drug therapy: Secondary | ICD-10-CM

## 2021-07-08 DIAGNOSIS — M359 Systemic involvement of connective tissue, unspecified: Secondary | ICD-10-CM

## 2021-07-13 LAB — PROTEIN,TOTAL AND PROTEIN ELECTROPHORESIS, RANDOM URINE(REFL)
Creatinine, Urine: 59 mg/dL (ref 20–275)
Protein/Creat Ratio: 85 mg/g creat (ref 21–161)
Protein/Creatinine Ratio: 0.085 mg/mg creat (ref 0.021–0.161)
Total Protein, Urine: 5 mg/dL (ref 5–24)

## 2021-07-13 NOTE — Progress Notes (Signed)
SPEP negative

## 2021-07-18 ENCOUNTER — Encounter: Payer: Self-pay | Admitting: Internal Medicine

## 2021-07-19 ENCOUNTER — Encounter: Payer: Self-pay | Admitting: Internal Medicine

## 2021-07-19 ENCOUNTER — Ambulatory Visit (INDEPENDENT_AMBULATORY_CARE_PROVIDER_SITE_OTHER): Payer: BC Managed Care – PPO | Admitting: Internal Medicine

## 2021-07-19 ENCOUNTER — Other Ambulatory Visit: Payer: Self-pay

## 2021-07-19 VITALS — BP 118/74 | HR 82 | Temp 98.3°F | Ht 64.0 in

## 2021-07-19 DIAGNOSIS — N3000 Acute cystitis without hematuria: Secondary | ICD-10-CM | POA: Diagnosis not present

## 2021-07-19 DIAGNOSIS — R300 Dysuria: Secondary | ICD-10-CM | POA: Diagnosis not present

## 2021-07-19 LAB — POC URINALSYSI DIPSTICK (AUTOMATED)
Bilirubin, UA: 1
Blood, UA: NEGATIVE
Glucose, UA: NEGATIVE
Ketones, UA: NEGATIVE
Nitrite, UA: POSITIVE
Protein, UA: POSITIVE — AB
Spec Grav, UA: 1.02 (ref 1.010–1.025)
Urobilinogen, UA: 0.2 E.U./dL
pH, UA: 5.5 (ref 5.0–8.0)

## 2021-07-19 MED ORDER — NITROFURANTOIN MONOHYD MACRO 100 MG PO CAPS: 100.0000 mg | ORAL_CAPSULE | Freq: Two times a day (BID) | ORAL | 0 refills | Status: DC

## 2021-07-19 NOTE — Progress Notes (Signed)
Subjective:    Patient ID: [name], female    DOB: 05/12/1946, 75 y.o.   MRN: [id number]  HPI The patient is here for an acute visit.   ? UTI:  Her symptoms started  6 days ago.  She states dysuria, cloudy urine, lower abdominal bloating and discomfort, urinary frequency, urinary urgency.    No hematuria, abdominal pain, back pain, nausea, fever.    Medications and allergies reviewed with patient and updated if appropriate.  Patient Active Problem List   Diagnosis Date Noted   Coccyx pain 05/19/2021   Abrasion 05/19/2021   B12 deficiency 04/15/2021   COVID-19 04/14/2021   Autoimmune disorder ([place]) 04/13/2021   At risk for long QT syndrome 01/21/2021   Acute cystitis without hematuria 01/21/2021   Lumbar back pain with radiculopathy affecting left lower extremity 08/05/2020   Atypical chest pain 03/10/2020   Shingles 11/19/2019   Pain in both knees 07/09/2019   Right arm pain 02/26/2019   Acute pain of left knee 08/27/2018   Hand arthritis 06/13/2018   Trigger middle finger of right hand 06/13/2018   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   Intermittent palpitations 10/11/2017   Migraine 12/30/2015   Upper airway cough syndrome 10/29/2015   Osteoporosis 06/06/2011   Vitamin D deficiency 03/22/2010   BREAST CANCER, HX OF 03/22/2010   SPINAL STENOSIS, LUMBAR 03/25/2008   HYPERLIPIDEMIA 09/28/2007   Fibromyalgia 05/24/2007    Current Outpatient Medications on File Prior to Visit  Medication Sig Dispense Refill   acetaminophen (TYLENOL) 500 MG tablet Take 1,000 mg by mouth daily as needed for mild pain.     amLODipine (NORVASC) 5 MG tablet Take 1 tablet (5 mg total) by mouth daily. 90 tablet 3   CALCIUM PO Take by mouth daily.     cholecalciferol (VITAMIN D3) 25 MCG (1000 UNIT) tablet Take 1,000 Units by mouth daily.     Cyanocobalamin (VITAMIN B-12 PO) Take by mouth daily.     eletriptan (RELPAX) 20 MG tablet TAKE 1 TABLET BY MOUTH EVERY 2 HOURS AS NEEDED FOR  HEADACHE OR MIGRAINE. MAY REPEAT IN 2 HOURS IF HEADACHE PERSISTS OR RECURS. 9 tablet 1   gabapentin (NEURONTIN) 100 MG capsule Take 1 capsule (100 mg total) by mouth daily as needed. 90 capsule 3   hydroxychloroquine (PLAQUENIL) 200 MG tablet TAKE 1 TABLET BY MOUTH TWICE DAILY MONDAY THROUGH FRIDAY 120 tablet 0   MAGNESIUM PO Take 1 tablet by mouth daily as needed.     traZODone (DESYREL) 50 MG tablet TAKE 3 TABLETS(150 MG) BY MOUTH AT BEDTIME 90 tablet 1   TURMERIC PO Take by mouth daily.     No current facility-administered medications on file prior to visit.    Past Medical History:  Diagnosis Date   Arthritis    Breast cancer ([place])    R lumpectomy ; radiation; no chemotherapy   Bronchitis    recurrent   Cataract    multifocal lens implant   Chronic low back pain    bulging disc & spinal stenosis   Coccygeal fracture ([place]) 05/15/2021   COVID-19    Fibromyalgia    Hyperlipemia    Mitral valve prolapse    Osteoporosis    Personal history of radiation therapy    Pneumonia    as OP X 2; no Pneumovax   Vitamin D deficiency    18 in  01/2009    Past Surgical History:  Procedure Laterality Date   ABDOMINAL HYSTERECTOMY  1990   USO for fibroid , Dr Maryruth Eve   BREAST LUMPECTOMY Right    BREAST SURGERY  2010   R side lumpectomy; Dr Margot Chimes   CATARACT EXTRACTION, BILATERAL  2017   CESAREAN SECTION      G 2 P 2   CYSTECTOMY  2010   EYE SURGERY     eye lid surgery   FOOT SURGERY Right    no colonoscopy     "I just don't want to do it". SOC reviewed    Social History   Socioeconomic History   Marital status: Married    Spouse name: Not on file   Number of children: Not on file   Years of education: Not on file   Highest education level: Not on file  Occupational History   Not on file  Tobacco Use   Smoking status: Former    Types: Cigarettes    Quit date: 12/19/1970    Years since quitting: 50.6   Smokeless tobacco: Never   Tobacco comments:    [phone], up to 3  cigarettes / day  Vaping Use   Vaping Use: Never used  Substance and Sexual Activity   Alcohol use: No   Drug use: No   Sexual activity: Not on file  Other Topics Concern   Not on file  Social History Narrative   Not on file   Social Determinants of Health   Financial Resource Strain: Not on file  Food Insecurity: Not on file  Transportation Needs: Not on file  Physical Activity: Not on file  Stress: Not on file  Social Connections: Not on file    Family History  Problem Relation Age of Onset   Atrial fibrillation Mother        S/P cardioversion   Ulcers Mother    Hypertension Mother    Dementia Mother    Heart attack Father 42       died @ 8   Heart attack Paternal Aunt        < 37   Heart attack Paternal Grandfather        > 77   Heart attack Paternal Uncle        > 5   Heart attack Paternal Grandmother        < 27   Hypertension Brother    Thyroid disease Daughter        on Synthroid since high school   Diabetes Brother    Healthy Son    Stroke Neg Hx    Breast cancer Neg Hx     Review of Systems     Objective:   Vitals:   07/19/21 1533  BP: 118/74  Pulse: 82  Temp: 98.3 F (36.8 C)  SpO2: 99%   BP Readings from Last 3 Encounters:  07/19/21 118/74  06/30/21 127/82  05/24/21 122/80   Wt Readings from Last 3 Encounters:  06/30/21 127 lb (57.6 kg)  05/24/21 125 lb (56.7 kg)  03/03/21 131 lb 12.8 oz (59.8 kg)   Body mass index is 21.8 kg/m.   Physical Exam Constitutional:      General: She is not in acute distress.    Appearance: Normal appearance. She is not ill-appearing.  HENT:     Head: Normocephalic and atraumatic.  Abdominal:     General: There is no distension.     Palpations: Abdomen is soft.     Tenderness: There is no abdominal tenderness. There is no right CVA tenderness, left CVA tenderness,  guarding or rebound.  Skin:    General: Skin is warm and dry.  Neurological:     Mental Status: She is alert.            Assessment & Plan:    See Problem List for Assessment and Plan of chronic medical problems.    This visit occurred during the SARS-CoV-2 public health emergency.  Safety protocols were in place, including screening questions prior to the visit, additional usage of staff PPE, and extensive cleaning of exam room while observing appropriate contact time as indicated for disinfecting solutions.

## 2021-07-19 NOTE — Assessment & Plan Note (Addendum)
Acute Urine dip consistent with UTI Will send urine for culture Take the antibiotic as prescribed.  Nitrofurantoin 100 mg twice daily x7 days Take tylenol if needed.   Increase your water intake.  Call if no improvement  She has been having more urinary tract infections.  She is unsure why.  Discussed some possible causes Discussed possible urology referral-she deferred for now Discussed possible trial of vaginal estrogen cream-she deferred for now

## 2021-07-19 NOTE — Patient Instructions (Signed)
Take the antibiotic as prescribed.  Take tylenol if needed.     Increase your water intake.   Call if no improvement     Urinary Tract Infection, Adult A urinary tract infection (UTI) is an infection of any part of the urinary tract, which includes the kidneys, ureters, bladder, and urethra. These organs make, store, and get rid of urine in the body. UTI can be a bladder infection (cystitis) or kidney infection (pyelonephritis). What are the causes? This infection may be caused by fungi, viruses, or bacteria. Bacteria are the most common cause of UTIs. This condition can also be caused by repeated incomplete emptying of the bladder during urination. What increases the risk? This condition is more likely to develop if:  You ignore your need to urinate or hold urine for long periods of time.  You do not empty your bladder completely during urination.  You wipe back to front after urinating or having a bowel movement, if you are female.  You are uncircumcised, if you are female.  You are constipated.  You have a urinary catheter that stays in place (indwelling).  You have a weak defense (immune) system.  You have a medical condition that affects your bowels, kidneys, or bladder.  You have diabetes.  You take antibiotic medicines frequently or for long periods of time, and the antibiotics no longer work well against certain types of infections (antibiotic resistance).  You take medicines that irritate your urinary tract.  You are exposed to chemicals that irritate your urinary tract.  You are female.  What are the signs or symptoms? Symptoms of this condition include:  Fever.  Frequent urination or passing small amounts of urine frequently.  Needing to urinate urgently.  Pain or burning with urination.  Urine that smells bad or unusual.  Cloudy urine.  Pain in the lower abdomen or back.  Trouble urinating.  Blood in the urine.  Vomiting or being less hungry than  normal.  Diarrhea or abdominal pain.  Vaginal discharge, if you are female.  How is this diagnosed? This condition is diagnosed with a medical history and physical exam. You will also need to provide a urine sample to test your urine. Other tests may be done, including:  Blood tests.  Sexually transmitted disease (STD) testing.  If you have had more than one UTI, a cystoscopy or imaging studies may be done to determine the cause of the infections. How is this treated? Treatment for this condition often includes a combination of two or more of the following:  Antibiotic medicine.  Other medicines to treat less common causes of UTI.  Over-the-counter medicines to treat pain.  Drinking enough water to stay hydrated.  Follow these instructions at home:  Take over-the-counter and prescription medicines only as told by your health care provider.  If you were prescribed an antibiotic, take it as told by your health care provider. Do not stop taking the antibiotic even if you start to feel better.  Avoid alcohol, caffeine, tea, and carbonated beverages. They can irritate your bladder.  Drink enough fluid to keep your urine clear or pale yellow.  Keep all follow-up visits as told by your health care provider. This is important.  Make sure to: ? Empty your bladder often and completely. Do not hold urine for long periods of time. ? Empty your bladder before and after sex. ? Wipe from front to back after a bowel movement if you are female. Use each tissue one time when you   wipe. Contact a health care provider if:  You have back pain.  You have a fever.  You feel nauseous or vomit.  Your symptoms do not get better after 3 days.  Your symptoms go away and then return. Get help right away if:  You have severe back pain or lower abdominal pain.  You are vomiting and cannot keep down any medicines or water. This information is not intended to replace advice given to you by  your health care provider. Make sure you discuss any questions you have with your health care provider. Document Released: 09/14/2005 Document Revised: 05/18/2016 Document Reviewed: 10/26/2015 Elsevier Interactive Patient Education  2018 Elsevier Inc.   

## 2021-07-21 LAB — CULTURE, URINE COMPREHENSIVE

## 2021-08-26 ENCOUNTER — Encounter: Payer: Self-pay | Admitting: Internal Medicine

## 2021-08-27 ENCOUNTER — Other Ambulatory Visit: Payer: Self-pay

## 2021-08-27 ENCOUNTER — Other Ambulatory Visit: Payer: BC Managed Care – PPO

## 2021-08-27 DIAGNOSIS — R300 Dysuria: Secondary | ICD-10-CM

## 2021-08-27 LAB — URINALYSIS, ROUTINE W REFLEX MICROSCOPIC
Bilirubin Urine: NEGATIVE
Nitrite: POSITIVE — AB
Specific Gravity, Urine: >=1.03 — AB (ref 1.000–1.030)
Urine Glucose: NEGATIVE
Urobilinogen, UA: 0.2 (ref 0.0–1.0)
pH: 5 (ref 5.0–8.0)

## 2021-08-28 MED ORDER — NITROFURANTOIN MONOHYD MACRO 100 MG PO CAPS: 100.0000 mg | ORAL_CAPSULE | Freq: Two times a day (BID) | ORAL | 0 refills | Status: DC

## 2021-08-29 LAB — CULTURE, URINE COMPREHENSIVE

## 2021-08-30 MED ORDER — SULFAMETHOXAZOLE-TRIMETHOPRIM 800-160 MG PO TABS: 1.0000 | ORAL_TABLET | Freq: Two times a day (BID) | ORAL | 0 refills | Status: AC

## 2021-08-30 NOTE — Addendum Note (Signed)
Addended by: Binnie Rail on: 08/30/2021 07:20 AM   Modules accepted: Orders

## 2021-08-31 DIAGNOSIS — D8989 Other specified disorders involving the immune mechanism, not elsewhere classified: Secondary | ICD-10-CM | POA: Diagnosis not present

## 2021-08-31 DIAGNOSIS — N1831 Chronic kidney disease, stage 3a: Secondary | ICD-10-CM | POA: Diagnosis not present

## 2021-08-31 DIAGNOSIS — N390 Urinary tract infection, site not specified: Secondary | ICD-10-CM | POA: Diagnosis not present

## 2021-09-16 DIAGNOSIS — N1831 Chronic kidney disease, stage 3a: Secondary | ICD-10-CM | POA: Diagnosis not present

## 2021-09-20 ENCOUNTER — Encounter: Payer: Self-pay | Admitting: Internal Medicine
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
## 2021-09-25 ENCOUNTER — Other Ambulatory Visit: Payer: Self-pay | Admitting: Physician Assistant

## 2021-09-27 NOTE — Telephone Encounter (Signed)
Next Visit: 10/05/2021  Last Visit: 06/30/2021  Labs: 05/24/2021 CBC is normal, CMP shows low renal function which is stable.   Eye exam:  05/28/2021 WNL   Current Dose per office note 06/30/2021: hydroxychloroquine 200 mg p.o. twice daily Monday to Friday  PT:[id number] disease   Last Fill: 06/28/2021  Okay to refill Plaquenil?

## 2021-10-05 ENCOUNTER — Ambulatory Visit (INDEPENDENT_AMBULATORY_CARE_PROVIDER_SITE_OTHER): Payer: BC Managed Care – PPO | Admitting: Rheumatology

## 2021-10-05 ENCOUNTER — Other Ambulatory Visit: Payer: Self-pay | Admitting: *Deleted

## 2021-10-05 ENCOUNTER — Encounter: Payer: Self-pay | Admitting: Rheumatology

## 2021-10-05 ENCOUNTER — Other Ambulatory Visit: Payer: Self-pay

## 2021-10-05 VITALS — BP 130/80 | HR 73 | Resp 15 | Ht 64.5 in

## 2021-10-05 DIAGNOSIS — R002 Palpitations: Secondary | ICD-10-CM

## 2021-10-05 DIAGNOSIS — L659 Nonscarring hair loss, unspecified: Secondary | ICD-10-CM

## 2021-10-05 DIAGNOSIS — R5383 Other fatigue: Secondary | ICD-10-CM

## 2021-10-05 DIAGNOSIS — Z853 Personal history of malignant neoplasm of breast: Secondary | ICD-10-CM

## 2021-10-05 DIAGNOSIS — Z79899 Other long term (current) drug therapy: Secondary | ICD-10-CM | POA: Diagnosis not present

## 2021-10-05 DIAGNOSIS — M19041 Primary osteoarthritis, right hand: Secondary | ICD-10-CM

## 2021-10-05 DIAGNOSIS — M359 Systemic involvement of connective tissue, unspecified: Secondary | ICD-10-CM

## 2021-10-05 DIAGNOSIS — E559 Vitamin D deficiency, unspecified: Secondary | ICD-10-CM

## 2021-10-05 DIAGNOSIS — Z8669 Personal history of other diseases of the nervous system and sense organs: Secondary | ICD-10-CM

## 2021-10-05 DIAGNOSIS — E538 Deficiency of other specified B group vitamins: Secondary | ICD-10-CM

## 2021-10-05 DIAGNOSIS — M25462 Effusion, left knee: Secondary | ICD-10-CM

## 2021-10-05 DIAGNOSIS — S322XXD Fracture of coccyx, subsequent encounter for fracture with routine healing: Secondary | ICD-10-CM

## 2021-10-05 DIAGNOSIS — M65331 Trigger finger, right middle finger: Secondary | ICD-10-CM | POA: Diagnosis not present

## 2021-10-05 DIAGNOSIS — M19042 Primary osteoarthritis, left hand: Secondary | ICD-10-CM

## 2021-10-05 DIAGNOSIS — M1611 Unilateral primary osteoarthritis, right hip: Secondary | ICD-10-CM

## 2021-10-05 DIAGNOSIS — M19072 Primary osteoarthritis, left ankle and foot: Secondary | ICD-10-CM

## 2021-10-05 DIAGNOSIS — U071 COVID-19: Secondary | ICD-10-CM

## 2021-10-05 DIAGNOSIS — M19071 Primary osteoarthritis, right ankle and foot: Secondary | ICD-10-CM

## 2021-10-05 DIAGNOSIS — M5136 Other intervertebral disc degeneration, lumbar region: Secondary | ICD-10-CM

## 2021-10-05 DIAGNOSIS — M797 Fibromyalgia: Secondary | ICD-10-CM

## 2021-10-05 DIAGNOSIS — M810 Age-related osteoporosis without current pathological fracture: Secondary | ICD-10-CM

## 2021-10-05 DIAGNOSIS — R7989 Other specified abnormal findings of blood chemistry: Secondary | ICD-10-CM

## 2021-10-07 ENCOUNTER — Telehealth: Payer: Self-pay

## 2021-10-07 NOTE — Telephone Encounter (Signed)
Patient called stating Dr. Estanislado Pandy gave her a prescription for a left hand thumb splint.  Patient states she took the prescription to [facility] and was told to come back in a few hours to pick it up.  Patient states when she went back they told her they don't fill prescriptions for braces and couldn't find the prescription she took in.  Patient is requesting another prescription to take to a medical supply store.  Patient states she can stop by the office tomorrow, 10/08/21 to pick it up.

## 2021-10-07 NOTE — Telephone Encounter (Signed)
Attempted to contact the patient and left message to advise patient she may come by to pick up Rx for brace tomorrow.

## 2021-10-08 ENCOUNTER — Telehealth: Payer: Self-pay | Admitting: Rheumatology

## 2021-10-08 NOTE — Telephone Encounter (Signed)
I spoke with peer-to-peer, MRI was approved.

## 2021-10-08 NOTE — Telephone Encounter (Signed)
Per Gerald Stabs with [facility], MRI Left knee w/o contrast ([id number]) does not meet criteria for approval. Please have doctor call [phone] to do a Peer to Peer. Reference # [id number].

## 2021-10-15 DIAGNOSIS — M25562 Pain in left knee: Secondary | ICD-10-CM | POA: Diagnosis not present

## 2021-10-19 ENCOUNTER — Telehealth: Payer: Self-pay | Admitting: Rheumatology

## 2021-10-19 NOTE — Telephone Encounter (Signed)
Patient had MRI on Friday at [facility]. Patient wanted to know if you received results as of yet? Please call to advise.

## 2021-10-19 NOTE — Telephone Encounter (Signed)
Patient advised we have not received the results yet. Patient advised we will contact her once the results have been received and reviewed by Dr. Estanislado Pandy. Patient expressed understanding.

## 2021-10-25 NOTE — Progress Notes (Deleted)
Office Visit Note  Patient: [name]             Date of Birth: 04-17-1946           MRN: [id number]             PCP: Binnie Rail, MD Referring: Binnie Rail, MD Visit Date: 10/26/2021 Occupation: @GUAROCC @  Subjective:  Discuss MRI results  History of Present Illness: [name] is a 75 y.o. female with history of autoimmune disease, osteoarthritis, and fibromyalgia.  She is currently taking.  She presents today to discuss recent MRI results and treatment options.     Activities of Daily Living:  Patient reports morning stiffness for *** {minute/hour:19697}.   Patient {ACTIONS;DENIES/REPORTS:21021675::"Denies"} nocturnal pain.  Difficulty dressing/grooming: {ACTIONS;DENIES/REPORTS:21021675::"Denies"} Difficulty climbing stairs: {ACTIONS;DENIES/REPORTS:21021675::"Denies"} Difficulty getting out of chair: {ACTIONS;DENIES/REPORTS:21021675::"Denies"} Difficulty using hands for taps, buttons, cutlery, and/or writing: {ACTIONS;DENIES/REPORTS:21021675::"Denies"}  No Rheumatology ROS completed.   PMFS History:  Patient Active Problem List   Diagnosis Date Noted   Coccyx pain 05/19/2021   Abrasion 05/19/2021   B12 deficiency 04/15/2021   COVID-19 04/14/2021   Autoimmune disorder ([place]) 04/13/2021   At risk for long QT syndrome 01/21/2021   Acute cystitis without hematuria 01/21/2021   Lumbar back pain with radiculopathy affecting left lower extremity 08/05/2020   Atypical chest pain 03/10/2020   Shingles 11/19/2019   Pain in both knees 07/09/2019   Right arm pain 02/26/2019   Acute pain of left knee 08/27/2018   Hand arthritis 06/13/2018   Trigger middle finger of right hand 06/13/2018   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   Intermittent palpitations 10/11/2017   Migraine 12/30/2015   Upper airway cough syndrome 10/29/2015   Osteoporosis 06/06/2011   Vitamin D deficiency 03/22/2010   BREAST CANCER, HX OF 03/22/2010   SPINAL STENOSIS, LUMBAR 03/25/2008    HYPERLIPIDEMIA 09/28/2007   Fibromyalgia 05/24/2007    Past Medical History:  Diagnosis Date   Arthritis    Breast cancer ([place])    R lumpectomy ; radiation; no chemotherapy   Bronchitis    recurrent   Cataract    multifocal lens implant   Chronic low back pain    bulging disc & spinal stenosis   Coccygeal fracture ([facility]) 05/15/2021   COVID-19    Cystitis    Fibromyalgia    Hyperlipemia    Mitral valve prolapse    Osteoporosis    Personal history of radiation therapy    Pneumonia    as OP X 2; no Pneumovax   Vitamin D deficiency    18 in  01/2009    Family History  Problem Relation Age of Onset   Atrial fibrillation Mother        S/P cardioversion   Ulcers Mother    Hypertension Mother    Dementia Mother    Heart attack Father 80       died @ 59   Heart attack Paternal Aunt        < 70   Heart attack Paternal Grandfather        > 39   Heart attack Paternal Uncle        > 10   Heart attack Paternal Grandmother        < 53   Hypertension Brother    Thyroid disease Daughter        on Synthroid since high school   Diabetes Brother    Healthy Son    Stroke Neg Hx  Breast cancer Neg Hx    Past Surgical History:  Procedure Laterality Date   ABDOMINAL HYSTERECTOMY  1990   USO for fibroid , Dr Maryruth Eve   BREAST LUMPECTOMY Right    BREAST SURGERY  2010   R side lumpectomy; Dr Margot Chimes   CATARACT EXTRACTION, BILATERAL  2017   [place] 2 P 2   CYSTECTOMY  2010   EYE SURGERY     eye lid surgery   FOOT SURGERY Right    no colonoscopy     "I just don't want to do it". SOC reviewed   Social History   Social History Narrative   Not on file   Immunization History  Administered Date(s) Administered   Fluad Quad(high Dose 65+) 10/02/2019   PFIZER(Purple Top)SARS-COV-2 Vaccination 01/08/2020, 01/27/2020, 10/17/2020   Td 12/20/1999     Objective: Vital Signs: There were no vitals taken for this visit.   Physical Exam Vitals and nursing note  reviewed.  Constitutional:      Appearance: She is well-developed.  HENT:     Head: Normocephalic and atraumatic.  Eyes:     Conjunctiva/sclera: Conjunctivae normal.  Pulmonary:     Effort: Pulmonary effort is normal.  Abdominal:     Palpations: Abdomen is soft.  Musculoskeletal:     Cervical back: Normal range of motion.  Skin:    General: Skin is warm and dry.     Capillary Refill: Capillary refill takes less than 2 seconds.  Neurological:     Mental Status: She is alert and oriented to person, place, and time.  Psychiatric:        Behavior: Behavior normal.     Musculoskeletal Exam: ***  CDAI Exam: CDAI Score: -- Patient Global: --; Provider Global: -- Swollen: --; Tender: -- Joint Exam 10/26/2021   No joint exam has been documented for this visit   There is currently no information documented on the homunculus. Go to the Rheumatology activity and complete the homunculus joint exam.  Investigation: No additional findings.  Imaging: No results found.  Recent Labs: Lab Results  Component Value Date   WBC 4.5 06/30/2021   HGB 12.8 06/30/2021   PLT 316 06/30/2021   NA 139 05/24/2021   K 4.9 05/24/2021   CL 104 05/24/2021   CO2 27 05/24/2021   GLUCOSE 84 05/24/2021   BUN 19 05/24/2021   CREATININE 1.09 (H) 05/24/2021   BILITOT 0.5 05/24/2021   ALKPHOS 63 04/17/2020   AST 17 05/24/2021   ALT 19 05/24/2021   PROT 6.9 06/30/2021   ALBUMIN 4.6 04/17/2020   CALCIUM 10.0 05/24/2021   GFRAA 58 (L) 05/24/2021    Speciality Comments: PLQ Eye Exam: 05/28/2021 WNL @ [facility] Follow up in 6 months.  Procedures:  No procedures performed Allergies: Morphine, Albuterol, Augmentin [amoxicillin-pot clavulanate], Ciprofloxacin, Fish allergy, Gabapentin, Risedronate sodium, Cyclobenzaprine, and Ibandronate sodium   Assessment / Plan:     Visit Diagnoses: Autoimmune disease ([place])  High risk medication use  Primary osteoarthritis of both  hands  Trigger middle finger of right hand  Primary osteoarthritis of right hip  Effusion, left knee  Primary osteoarthritis of both feet  DDD (degenerative disc disease), lumbar  Closed fracture of coccyx with routine healing, subsequent encounter  Age-related osteoporosis without current pathological fracture  Vitamin D deficiency  Fibromyalgia  Other fatigue  Elevated serum creatinine  Hair loss  History of breast cancer  Vitamin B12 deficiency  Intermittent palpitations  Hx of migraines  History of shingles  Orders: No orders of the defined types were placed in this encounter.  No orders of the defined types were placed in this encounter.     Follow-Up Instructions: No follow-ups on file.   Ofilia Neas, PA-C  Note - This record has been created using Dragon software.  Chart creation errors have been sought, but may not always  have been located. Such creation errors do not reflect on  the standard of medical care.

## 2021-10-26 ENCOUNTER — Ambulatory Visit: Payer: BC Managed Care – PPO | Admitting: Physician Assistant

## 2021-10-26 DIAGNOSIS — E538 Deficiency of other specified B group vitamins: Secondary | ICD-10-CM

## 2021-10-26 DIAGNOSIS — M25462 Effusion, left knee: Secondary | ICD-10-CM

## 2021-10-26 DIAGNOSIS — R002 Palpitations: Secondary | ICD-10-CM

## 2021-10-26 DIAGNOSIS — M19041 Primary osteoarthritis, right hand: Secondary | ICD-10-CM

## 2021-10-26 DIAGNOSIS — L659 Nonscarring hair loss, unspecified: Secondary | ICD-10-CM

## 2021-10-26 DIAGNOSIS — M1611 Unilateral primary osteoarthritis, right hip: Secondary | ICD-10-CM

## 2021-10-26 DIAGNOSIS — Z8669 Personal history of other diseases of the nervous system and sense organs: Secondary | ICD-10-CM

## 2021-10-26 DIAGNOSIS — R7989 Other specified abnormal findings of blood chemistry: Secondary | ICD-10-CM

## 2021-10-26 DIAGNOSIS — M19071 Primary osteoarthritis, right ankle and foot: Secondary | ICD-10-CM

## 2021-10-26 DIAGNOSIS — Z79899 Other long term (current) drug therapy: Secondary | ICD-10-CM

## 2021-10-26 DIAGNOSIS — Z853 Personal history of malignant neoplasm of breast: Secondary | ICD-10-CM

## 2021-10-26 DIAGNOSIS — M797 Fibromyalgia: Secondary | ICD-10-CM

## 2021-10-26 DIAGNOSIS — S322XXD Fracture of coccyx, subsequent encounter for fracture with routine healing: Secondary | ICD-10-CM

## 2021-10-26 DIAGNOSIS — M359 Systemic involvement of connective tissue, unspecified: Secondary | ICD-10-CM

## 2021-10-26 DIAGNOSIS — M810 Age-related osteoporosis without current pathological fracture: Secondary | ICD-10-CM

## 2021-10-26 DIAGNOSIS — M65331 Trigger finger, right middle finger: Secondary | ICD-10-CM

## 2021-10-26 DIAGNOSIS — R5383 Other fatigue: Secondary | ICD-10-CM

## 2021-10-26 DIAGNOSIS — E559 Vitamin D deficiency, unspecified: Secondary | ICD-10-CM

## 2021-10-26 DIAGNOSIS — Z8619 Personal history of other infectious and parasitic diseases: Secondary | ICD-10-CM

## 2021-10-26 DIAGNOSIS — M5136 Other intervertebral disc degeneration, lumbar region: Secondary | ICD-10-CM

## 2021-10-26 NOTE — Progress Notes (Signed)
Office Visit Note  Patient: [name]             Date of Birth: 08-16-1946           MRN: [id number]             PCP: Binnie Rail, MD Referring: Binnie Rail, MD Visit Date: 10/27/2021 Occupation: @GUAROCC @  Subjective:  Discuss MRI results    History of Present Illness: PANHIA KARL is a 75 y.o. female with history of autoimmune disease and osteoarthritis.  She remains on Plaquenil 200 mg 1 tablet by mouth twice daily Monday through Friday.  She continues to tolerate Plaquenil without any side effects and denies missing any doses recently.  She presents today to discuss recent MRI results and treatment options.  She continues to have pain and swelling in the left knee joint.  She states that she is having more swelling than she did prior to having the left knee joint aspirated in June 2022.  She states she also woke up this morning with increased pain in her right shoulder and right second MCP joint.  She has been taking extra strength Tylenol 1-2 times daily for pain relief.  She continues to experience intermittent lower back pain but denies any symptoms of radiculopathy at this time.  Her right middle finger continues to lock intermittently.  She also has intermittent symptoms of Raynaud's but denies any digital ulcerations.  She has not had any recent facial rashes or oral or nasal ulcerations.  She continues to have eye dryness and uses eyedrops as needed.  She denies any shortness of breath, pleuritic chest pain, or palpitations.  She denies any swollen lymph nodes.    Activities of Daily Living:  Patient reports morning stiffness for several hours.   Patient Reports nocturnal pain.  Difficulty dressing/grooming: Reports Difficulty climbing stairs: Reports Difficulty getting out of chair: Reports Difficulty using hands for taps, buttons, cutlery, and/or writing: Reports  Review of Systems  Constitutional:  Positive for fatigue.  HENT:  Positive for mouth  dryness. Negative for mouth sores and nose dryness.   Eyes:  Positive for dryness. Negative for pain and itching.  Respiratory:  Negative for shortness of breath and difficulty breathing.   Cardiovascular:  Positive for palpitations. Negative for chest pain.  Gastrointestinal:  Negative for blood in stool, constipation and diarrhea.  Endocrine: Negative for increased urination.  Genitourinary:  Negative for difficulty urinating.  Musculoskeletal:  Positive for joint pain, joint pain, joint swelling, myalgias, morning stiffness, muscle tenderness and myalgias.  Skin:  Negative for color change, rash and redness.  Allergic/Immunologic: Negative for susceptible to infections.  Neurological:  Negative for dizziness, numbness, headaches and memory loss.  Hematological:  Positive for bruising/bleeding tendency.  Psychiatric/Behavioral:  Negative for confusion.    PMFS History:  Patient Active Problem List   Diagnosis Date Noted   Coccyx pain 05/19/2021   Abrasion 05/19/2021   B12 deficiency 04/15/2021   COVID-19 04/14/2021   Autoimmune disorder ([place]) 04/13/2021   At risk for long QT syndrome 01/21/2021   Acute cystitis without hematuria 01/21/2021   Lumbar back pain with radiculopathy affecting left lower extremity 08/05/2020   Atypical chest pain 03/10/2020   Shingles 11/19/2019   Pain in both knees 07/09/2019   Right arm pain 02/26/2019   Acute pain of left knee 08/27/2018   Hand arthritis 06/13/2018   Trigger middle finger of right hand 06/13/2018   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   Intermittent  palpitations 10/11/2017   Migraine 12/30/2015   Upper airway cough syndrome 10/29/2015   Osteoporosis 06/06/2011   Vitamin D deficiency 03/22/2010   BREAST CANCER, HX OF 03/22/2010   SPINAL STENOSIS, LUMBAR 03/25/2008   HYPERLIPIDEMIA 09/28/2007   Fibromyalgia 05/24/2007    Past Medical History:  Diagnosis Date   Arthritis    Breast cancer ([place])    R lumpectomy ; radiation; no  chemotherapy   Bronchitis    recurrent   Cataract    multifocal lens implant   Chronic low back pain    bulging disc & spinal stenosis   Coccygeal fracture ([facility]) 05/15/2021   COVID-19    Cystitis    Fibromyalgia    Hyperlipemia    Mitral valve prolapse    Osteoporosis    Personal history of radiation therapy    Pneumonia    as OP X 2; no Pneumovax   Vitamin D deficiency    18 in  01/2009    Family History  Problem Relation Age of Onset   Atrial fibrillation Mother        S/P cardioversion   Ulcers Mother    Hypertension Mother    Dementia Mother    Heart attack Father 3       died @ 9   Heart attack Paternal 47        < 81   Heart attack Paternal Grandfather        > 32   Heart attack Paternal Uncle        > 65   Heart attack Paternal Grandmother        < 99   Hypertension Brother    Thyroid disease Daughter        on Synthroid since high school   Diabetes Brother    Healthy Son    Stroke Neg Hx    Breast cancer Neg Hx    Past Surgical History:  Procedure Laterality Date   ABDOMINAL HYSTERECTOMY  1990   USO for fibroid , Dr Maryruth Eve   BREAST LUMPECTOMY Right    BREAST SURGERY  2010   R side lumpectomy; Dr Margot Chimes   CATARACT EXTRACTION, BILATERAL  2017   CESAREAN SECTION      G 2 P 2   CYSTECTOMY  2010   EYE SURGERY     eye lid surgery   FOOT SURGERY Right    no colonoscopy     "I just don't want to do it". SOC reviewed   Social History   Social History Narrative   Not on file   Immunization History  Administered Date(s) Administered   Fluad Quad(high Dose 65+) 10/02/2019   PFIZER(Purple Top)SARS-COV-2 Vaccination 01/08/2020, 01/27/2020, 10/17/2020   Td 12/20/1999     Objective: Vital Signs: BP (!) 142/89 (BP Location: Left Arm, Patient Position: Sitting, Cuff Size: Normal)   Pulse 67   Ht 5' 4.5" (1.638 m)   Wt 128 lb (58.1 kg) Comment: per patient  BMI 21.63 kg/m    Physical Exam Vitals and nursing note reviewed.  Constitutional:       Appearance: She is well-developed.  HENT:     Head: Normocephalic and atraumatic.  Eyes:     Conjunctiva/sclera: Conjunctivae normal.  Pulmonary:     Effort: Pulmonary effort is normal.  Abdominal:     Palpations: Abdomen is soft.  Musculoskeletal:     Cervical back: Normal range of motion.  Skin:    General: Skin is warm and dry.  Capillary Refill: Capillary refill takes less than 2 seconds.  Neurological:     Mental Status: She is alert and oriented to person, place, and time.  Psychiatric:        Behavior: Behavior normal.     Musculoskeletal Exam: C-spine, thoracic spine, and lumbar spine have good ROM with no discomfort.  Painful ROM of the right shoulder with tenderness over the subacromial bursa. Left shoulder has full ROM with no discomfort. Elbow joints and wrist joints have good ROM with no tenderness or inflammation.  Tenderness and synovitis of the right 2nd MCP joint. PIP and DIP thickening consistent with OA of both hands.  CMC joint prominence and thickening bilaterally.  Right hip has slightly limited ROM.  Painful ROM of the left knee with a moderate effusion.  Ankle joints have good ROM with no tenderness or joint swelling.   CDAI Exam: CDAI Score: -- Patient Global: --; Provider Global: -- Swollen: 2 ; Tender: 3  Joint Exam 10/27/2021      Right  Left  Glenohumeral   Tender     MCP 2  Swollen Tender     Knee     Swollen Tender     Investigation: No additional findings.  Imaging: No results found.  Recent Labs: Lab Results  Component Value Date   WBC 4.5 06/30/2021   HGB 12.8 06/30/2021   PLT 316 06/30/2021   NA 139 05/24/2021   K 4.9 05/24/2021   CL 104 05/24/2021   CO2 27 05/24/2021   GLUCOSE 84 05/24/2021   BUN 19 05/24/2021   CREATININE 1.09 (H) 05/24/2021   BILITOT 0.5 05/24/2021   ALKPHOS 63 04/17/2020   AST 17 05/24/2021   ALT 19 05/24/2021   PROT 6.9 06/30/2021   ALBUMIN 4.6 04/17/2020   CALCIUM 10.0 05/24/2021   GFRAA 58  (L) 05/24/2021    Speciality Comments: PLQ Eye Exam: 05/28/2021 WNL @ [facility] Follow up in 6 months.  Procedures:  No procedures performed Allergies: Morphine, Albuterol, Augmentin [amoxicillin-pot clavulanate], Ciprofloxacin, Fish allergy, Gabapentin, Risedronate sodium, Cyclobenzaprine, and Ibandronate sodium    Assessment / Plan:     Visit Diagnoses: Autoimmune disease ([place]) - ANA 1: 640NS, history of dry eyes, hair loss, Raynaud's, inflammatory arthritis: Scented today to discuss recent left knee joint MRI results as well as treatment options.  Patient was found to have tricompartmental degenerative changes and a moderate-sized joint effusion.  She continues to experience pain and inflammation in the left knee on a daily basis.  She has been taking extra strength Tylenol 1-2 times daily for pain relief.  This morning she woke up with increased pain in her right shoulder and has tenderness and synovitis of the right second MCP joint.  Discussed that her inflammatory arthritis remains active despite taking Plaquenil 200 mg 1 tablet by mouth twice daily Monday through Friday as prescribed.  Different treatment options were discussed today in detail.  Indications, contraindications, potential side effects of Arava were discussed.  All questions were addressed and consent was obtained.  She will be starting on Arava 10 mg daily x2 weeks and if labs are stable at that time she will increase to 20 mg daily.  She will remain on Plaquenil as prescribed.  A prescription for refill be sent to the pharmacy pending lab work today.  She is aware that she will return for lab work in 2 weeks x 2, 2 months, and then every 3 months.Standing orders for CBC and CMP will  be placed today.  A prescription for prednisone 20 mg tapering by 5 mg every 4 days was sent to the pharmacy today.  She will follow-up in the office in 6 to 8 weeks to assess her response.  Plan: CBC with Differential/Platelet, COMPLETE  METABOLIC PANEL WITH GFR, Urinalysis, Routine w reflex microscopic, Anti-DNA antibody, double-stranded, C3 and C4, Sedimentation rate  Medication counseling:  Baseline Immunosuppressant Therapy Labs  Serum Protein Electrophoresis Latest Ref Rng & Units 06/30/2021  Total Protein 6.1 - 8.1 g/dL 6.9  Albumin 3.8 - 4.8 g/dL 4.4  Alpha-1 0.2 - 0.3 g/dL 0.3  Alpha-2 0.5 - 0.9 g/dL 0.8  Beta Globulin 0.4 - 0.6 g/dL 0.4  Beta 2 0.2 - 0.5 g/dL 0.3  Gamma Globulin 0.8 - 1.7 g/dL 0.7(L)   Lab Results  Component Value Date   G6PDH 17.9 01/19/2021   Patient was counseled on the purpose, proper use, and adverse effects of leflunomide including risk of infection, nausea/diarrhea/weight loss, increase in blood pressure, rash, hair loss, tingling in the hands and feet, and signs and symptoms of interstitial lung disease.   Also counseled on [place] warning of liver injury and importance of avoiding alcohol while on therapy. Discussed that there is the possibility of an increased risk of malignancy but it is not well understood if this increased risk is due to the medication or the disease state.  Counseled patient to avoid live vaccines. Recommend annual influenza, Pneumovax 23, Prevnar 13, and Shingrix as indicated.   Discussed the importance of frequent monitoring of liver function and blood count.  Standing orders placed.  Discussed importance of birth control while on leflunomide due to risk of congenital abnormalities, and patient confirms she is postmenopausal. Provided patient with educational materials on leflunomide and answered all questions.  Patient consented to [place] use, and consent will be uploaded into the media tab.   Patient dose will be 10 mg daily x2 weeks and if labs are stable she will increase to 20 mg daily.  Prescription pending lab results and/or insurance approval.  High risk medication use -She will be starting on Arava 10 mg daily x2 weeks and if labs are stable at that time she  will increase to 20 mg daily.  She will remain on hydroxychloroquine 200 mg 1 tablet by mouth twice daily Monday to Friday. PLQ Eye Exam: 05/28/2021.  The following baseline immunosuppressive lab work will be obtained today.  Prescription for [place] pending lab results.  She will return for lab work in 2 weeks x 2, 2 months, then every 3 months.  Standing orders for CBC and CMP were placed today.  - Plan: QuantiFERON-TB Gold Plus, IgG, IgA, IgM, Hepatitis B core antibody, IgM, Hepatitis B surface antigen, Hepatitis C antibody Discussed the importance of holding arava if she develops signs or symptoms of an infection and to resume once the infection has completely cleared. She is planning on receiving the COVID-19 vaccine booster.  Discussed the importance of receiving annual influenza vaccination.  She was strongly encouraged to receive the Shingrix vaccine.  She was advised to follow-up with her PCP to further discuss.  Screening for tuberculosis -Order for TB gold released today.  Plan: QuantiFERON-TB Gold Plus  Primary osteoarthritis of both hands: She has PIP and DIP thickening consistent with osteoarthritis of both hands.  CMC joint prominence noted bilaterally.  Discussed the importance of joint protection and muscle strengthening.  Trigger middle finger of right hand: She continues to experience intermittent tenderness and  locking.  Primary osteoarthritis of right hip: She has a limited range of motion of the right hip with mild groin pain on examination today.  Effusion, left knee: MRI of left knee 10/15/21: Intact ligamentous structures and no acute bony findings.  No meniscal tears.  Tricompartmental degenerative changes most significant in the lateral patella femoral compartment.  Moderate sized joint effusion superior and medial patellar plica noted.  On examination the moderate effusion was noted.  She had pain with flexion of the right knee.  Primary osteoarthritis of both feet: She is not  experiencing any discomfort in her feet at this time.  She has good range of motion of both ankle joints with no tenderness or joint swelling.  DDD (degenerative disc disease), lumbar - Dr. Tera Helper continues to experience intermittent discomfort in her lower back.  No symptoms of radiculopathy.  Closed fracture of coccyx with routine healing, subsequent encounter  Age-related osteoporosis without current pathological fracture - 04/23/21 DEXA scan showed left femoral neck T score -2.7.  Intolerance to Fosamax and Boniva in the past. Declined prolia and reclast.  She is taking vitamin D 1000 units daily.  Vitamin D deficiency: She is taking vitamin D 1000 units daily.  Fibromyalgia: She experiences intermittent myalgias and muscle tenderness due to fibromyalgia.  She remains on gabapentin as prescribed and trazodone 150 mg at bedtime for insomnia.  Other fatigue: Chronic but stable.  Discussed the importance of regular exercise.  Elevated serum creatinine: Creatinine was 1.09 and GFR was 50 on 05/24/2021.  CMP with GFR updated today. She is not a good candidate for methotrexate.  We will monitor lab work closely while taking Arava and Plaquenil as combination therapy.  Hair loss: Unchanged  Other medical conditions are listed as follows:  History of breast cancer  Vitamin B12 deficiency  Intermittent palpitations  Hx of migraines  History of shingles: Patient is advised to schedule appointment with her PCP to discuss receiving the Shingrix vaccination.    Orders: Orders Placed This Encounter  Procedures   CBC with Differential/Platelet   COMPLETE METABOLIC PANEL WITH GFR   QuantiFERON-TB Gold Plus   IgG, IgA, IgM   Hepatitis B core antibody, IgM   Hepatitis B surface antigen   Hepatitis C antibody   Urinalysis, Routine w reflex microscopic   Anti-DNA antibody, double-stranded   C3 and C4   Sedimentation rate   CBC with Differential/Platelet   COMPLETE METABOLIC PANEL  WITH GFR    Meds ordered this encounter  Medications   predniSONE (DELTASONE) 5 MG tablet    Sig: Take 4 tablets by mouth daily x4 days, 3 tablets daily x4 days, 2 tablets daily x4 days, 1 tablet daily x4 days.    Dispense:  40 tablet    Refill:  0       Follow-Up Instructions: Return in about 2 months (around 12/27/2021) for Autoimmune Disease, Osteoarthritis.   Ofilia Neas, PA-C  Note - This record has been created using Dragon software.  Chart creation errors have been sought, but may not always  have been located. Such creation errors do not reflect on  the standard of medical care.

## 2021-10-27 ENCOUNTER — Ambulatory Visit (INDEPENDENT_AMBULATORY_CARE_PROVIDER_SITE_OTHER): Payer: BC Managed Care – PPO | Admitting: Physician Assistant

## 2021-10-27 ENCOUNTER — Encounter: Payer: Self-pay | Admitting: Physician Assistant

## 2021-10-27 ENCOUNTER — Other Ambulatory Visit: Payer: Self-pay

## 2021-10-27 VITALS — BP 142/89 | HR 67 | Ht 64.5 in | Wt 128.0 lb

## 2021-10-27 DIAGNOSIS — Z8619 Personal history of other infectious and parasitic diseases: Secondary | ICD-10-CM

## 2021-10-27 DIAGNOSIS — E538 Deficiency of other specified B group vitamins: Secondary | ICD-10-CM

## 2021-10-27 DIAGNOSIS — Z853 Personal history of malignant neoplasm of breast: Secondary | ICD-10-CM

## 2021-10-27 DIAGNOSIS — M810 Age-related osteoporosis without current pathological fracture: Secondary | ICD-10-CM

## 2021-10-27 DIAGNOSIS — M19041 Primary osteoarthritis, right hand: Secondary | ICD-10-CM

## 2021-10-27 DIAGNOSIS — M1611 Unilateral primary osteoarthritis, right hip: Secondary | ICD-10-CM

## 2021-10-27 DIAGNOSIS — Z111 Encounter for screening for respiratory tuberculosis: Secondary | ICD-10-CM

## 2021-10-27 DIAGNOSIS — M797 Fibromyalgia: Secondary | ICD-10-CM

## 2021-10-27 DIAGNOSIS — M359 Systemic involvement of connective tissue, unspecified: Secondary | ICD-10-CM | POA: Diagnosis not present

## 2021-10-27 DIAGNOSIS — M65331 Trigger finger, right middle finger: Secondary | ICD-10-CM

## 2021-10-27 DIAGNOSIS — R7989 Other specified abnormal findings of blood chemistry: Secondary | ICD-10-CM

## 2021-10-27 DIAGNOSIS — S322XXD Fracture of coccyx, subsequent encounter for fracture with routine healing: Secondary | ICD-10-CM

## 2021-10-27 DIAGNOSIS — M19072 Primary osteoarthritis, left ankle and foot: Secondary | ICD-10-CM

## 2021-10-27 DIAGNOSIS — Z79899 Other long term (current) drug therapy: Secondary | ICD-10-CM | POA: Diagnosis not present

## 2021-10-27 DIAGNOSIS — R002 Palpitations: Secondary | ICD-10-CM

## 2021-10-27 DIAGNOSIS — M19071 Primary osteoarthritis, right ankle and foot: Secondary | ICD-10-CM

## 2021-10-27 DIAGNOSIS — M25462 Effusion, left knee: Secondary | ICD-10-CM

## 2021-10-27 DIAGNOSIS — Z8669 Personal history of other diseases of the nervous system and sense organs: Secondary | ICD-10-CM

## 2021-10-27 DIAGNOSIS — M5136 Other intervertebral disc degeneration, lumbar region: Secondary | ICD-10-CM

## 2021-10-27 DIAGNOSIS — M19042 Primary osteoarthritis, left hand: Secondary | ICD-10-CM

## 2021-10-27 DIAGNOSIS — L659 Nonscarring hair loss, unspecified: Secondary | ICD-10-CM

## 2021-10-27 DIAGNOSIS — R5383 Other fatigue: Secondary | ICD-10-CM

## 2021-10-27 DIAGNOSIS — E559 Vitamin D deficiency, unspecified: Secondary | ICD-10-CM

## 2021-10-27 MED ORDER — PREDNISONE 5 MG PO TABS: ORAL_TABLET | ORAL | 0 refills | Status: DC

## 2021-10-27 NOTE — Patient Instructions (Addendum)
If you have signs or symptoms of an infection or start antibiotics: First, call your PCP for workup of your infection. Hold your medication through the infection, until you complete your antibiotics, and until symptoms resolve if you take the following: Leflunomide (Arava)  Vaccines You are taking a medication(s) that can suppress your immune system.  The following immunizations are recommended: Flu annually Covid-19  Td/Tdap (tetanus, diphtheria, pertussis) every 10 years Pneumonia (Prevnar 15 then Pneumovax 23 at least 1 year apart.  Alternatively, can take Prevnar 20 without needing additional dose) Shingrix: 2 doses from 4 weeks to 6 months apart  Please check with your PCP to make sure you are up to date.   Standing Labs We placed an order today for your standing lab work.   Please have your standing labs drawn in 2 weeks x 2, then every 3 months  If possible, please have your labs drawn 2 weeks prior to your appointment so that the provider can discuss your results at your appointment.  Please note that you may see your imaging and lab results in [facility] before we have reviewed them. We may be awaiting multiple results to interpret others before contacting you. Please allow our office up to 72 hours to thoroughly review all of the results before contacting the office for clarification of your results.  We have open lab daily: Monday through Thursday from 1:30-4:30 PM and Friday from 1:30-4:00 PM at the office of Dr. Bo Merino, [facility].   Please be advised, all patients with office appointments requiring lab work will take precedent over walk-in lab work.  If possible, please come for your lab work on Monday and Friday afternoons, as you may experience shorter wait times. The office is located at [address] [place], [place], [place] [address] No appointment is necessary.   Labs are drawn by [facility]. Please bring your co-pay at the time of your lab  draw.  You may receive a bill from [facility] for your lab work.  If you wish to have your labs drawn at another location, please call the office 24 hours in advance to send orders.  If you have any questions regarding directions or hours of operation,  please call [phone].   As a reminder, please drink plenty of water prior to coming for your lab work. Thanks!   Leflunomide tablets What is this medication? LEFLUNOMIDE (le FLOO na mide) is for rheumatoid arthritis. This medicine may be used for other purposes; ask your health care provider or pharmacist if you have questions. COMMON BRAND NAME(S): Arava What should I tell my care team before I take this medication? They need to know if you have any of these conditions: diabetes have a fever or infection high blood pressure immune system problems kidney disease liver disease low blood cell counts, like low white cell, platelet, or red cell counts lung or breathing disease, like asthma recently received or scheduled to receive a vaccine receiving treatment for cancer skin conditions or sensitivity tingling of the fingers or toes, or other nerve disorder tuberculosis an unusual or allergic reaction to leflunomide, teriflunomide, other medicines, food, dyes, or preservatives pregnant or trying to get pregnant breast-feeding How should I use this medication? Take this medicine by mouth with a full glass of water. Follow the directions on the prescription label. Take your medicine at regular intervals. Do not take your medicine more often than directed. Do not stop taking except on your doctor's advice. Talk to your pediatrician  regarding the use of this medicine in children. Special care may be needed. Overdosage: If you think you have taken too much of this medicine contact a poison control center or emergency room at once. NOTE: This medicine is only for you. Do not share this medicine with others. What if I miss a dose? If you  miss a dose, take it as soon as you can. If it is almost time for your next dose, take only that dose. Do not take double or extra doses. What may interact with this medication? Do not take this medicine with any of the following medications: teriflunomide This medicine may also interact with the following medications: alosetron birth control pills caffeine cefaclor certain medicines for diabetes like nateglinide, repaglinide, rosiglitazone, pioglitazone certain medicines for high cholesterol like atorvastatin, pravastatin, rosuvastatin, simvastatin charcoal cholestyramine ciprofloxacin duloxetine furosemide ketoprofen live virus vaccines medicines that increase your risk for infection methotrexate mitoxantrone paclitaxel penicillin theophylline tizanidine warfarin This list may not describe all possible interactions. Give your health care provider a list of all the medicines, herbs, non-prescription drugs, or dietary supplements you use. Also tell them if you smoke, drink alcohol, or use illegal drugs. Some items may interact with your medicine. What should I watch for while using this medication? Visit your health care provider for regular checks on your progress. Tell your doctor or health care provider if your symptoms do not start to get better or if they get worse. You may need blood work done while you are taking this medicine. This medicine may cause serious skin reactions. They can happen weeks to months after starting the medicine. Contact your health care provider right away if you notice fevers or flu-like symptoms with a rash. The rash may be red or purple and then turn into blisters or peeling of the skin. Or, you might notice a red rash with swelling of the face, lips or lymph nodes in your neck or under your arms. This medicine may stay in your body for up to 2 years after your last dose. Tell your doctor about any unusual side effects or symptoms. A medicine can be  given to help lower your blood levels of this medicine more quickly. Women must use effective birth control with this medicine. There is a potential for serious side effects to an unborn child. Do not become pregnant while taking this medicine. Inform your doctor if you wish to become pregnant. This medicine remains in your blood after you stop taking it. You must continue using effective birth control until the blood levels have been checked and they are low enough. A medicine can be given to help lower your blood levels of this medicine more quickly. Immediately talk to your doctor if you think you may be pregnant. You may need a pregnancy test. Talk to your health care provider or pharmacist for more information. You should not receive certain vaccines during your treatment and for a certain time after your treatment with this medication ends. Talk to your health care provider for more information. What side effects may I notice from receiving this medication? Side effects that you should report to your doctor or health care professional as soon as possible: allergic reactions like skin rash, itching or hives, swelling of the face, lips, or tongue breathing problems cough increased blood pressure low blood counts - this medicine may decrease the number of white blood cells and platelets. You may be at increased risk for infections and bleeding. pain, tingling, numbness in  the hands or feet rash, fever, and swollen lymph nodes redness, blistering, peeing or loosening of the skin, including inside the mouth signs of decreased platelets or bleeding - bruising, pinpoint red spots on the skin, black, tarry stools, blood in urine signs of infection - fever or chills, cough, sore throat, pain or trouble passing urine signs and symptoms of liver injury like dark yellow or brown urine; general ill feeling or flu-like symptoms; light-colored stools; loss of appetite; nausea; right upper belly pain; unusually  weak or tired; yellowing of the eyes or skin trouble passing urine or change in the amount of urine vomiting Side effects that usually do not require medical attention (report to your doctor or health care professional if they continue or are bothersome): diarrhea hair thinning or loss headache nausea tiredness This list may not describe all possible side effects. Call your doctor for medical advice about side effects. You may report side effects to FDA at 1-800-FDA-1088. Where should I keep my medication? Keep out of the reach of children. Store at room temperature between 15 and 30 degrees C (59 and 86 degrees F). Protect from moisture and light. Throw away any unused medicine after the expiration date. NOTE: This sheet is a summary. It may not cover all possible information. If you have questions about this medicine, talk to your doctor, pharmacist, or health care provider.  2022 Elsevier/Gold Standard (2019-03-15 00:00:00)

## 2021-10-27 NOTE — Progress Notes (Signed)
Pharmacy Note  Subjective: Patient presents today to the [facility] for follow up office visit.  Patient seen by the pharmacist for counseling on leflunomide Jolee Ewing) for autoimmune disease.  She currently takes 200mg  twice daily Monday through Friday only.  Objective: CBC    Component Value Date/Time   WBC 4.5 06/30/2021 1402   RBC 4.12 06/30/2021 1402   HGB 12.8 06/30/2021 1402   HGB 12.6 10/09/2012 1311   HCT 37.8 06/30/2021 1402   HCT 36.6 10/09/2012 1311   PLT 316 06/30/2021 1402   PLT 250 10/09/2012 1311   MCV 91.7 06/30/2021 1402   MCV 92.1 10/09/2012 1311   MCH 31.1 06/30/2021 1402   MCHC 33.9 06/30/2021 1402   RDW 12.9 06/30/2021 1402   RDW 13.2 10/09/2012 1311   LYMPHSABS 1,220 06/30/2021 1402   LYMPHSABS 1.1 10/09/2012 1311   MONOABS 0.3 03/06/2020 1143   MONOABS 0.3 10/09/2012 1311   EOSABS 41 06/30/2021 1402   EOSABS 0.2 10/09/2012 1311   BASOSABS 41 06/30/2021 1402   BASOSABS 0.1 10/09/2012 1311    CMP     Component Value Date/Time   NA 139 05/24/2021 1227   NA 139 10/09/2012 1311   K 4.9 05/24/2021 1227   K 4.2 10/09/2012 1311   CL 104 05/24/2021 1227   CL 105 10/09/2012 1311   CO2 27 05/24/2021 1227   CO2 24 10/09/2012 1311   GLUCOSE 84 05/24/2021 1227   GLUCOSE 92 10/09/2012 1311   BUN 19 05/24/2021 1227   BUN 16.0 10/09/2012 1311   CREATININE 1.09 (H) 05/24/2021 1227   CREATININE 0.9 10/09/2012 1311   CALCIUM 10.0 05/24/2021 1227   CALCIUM 9.9 10/09/2012 1311   PROT 6.9 06/30/2021 1402   PROT 6.7 10/09/2012 1311   ALBUMIN 4.6 04/17/2020 1102   ALBUMIN 4.3 10/09/2012 1311   AST 17 05/24/2021 1227   AST 16 10/09/2012 1311   ALT 19 05/24/2021 1227   ALT 21 10/09/2012 1311   ALKPHOS 63 04/17/2020 1102   ALKPHOS 85 10/09/2012 1311   BILITOT 0.5 05/24/2021 1227   BILITOT 0.50 10/09/2012 1311   GFRNONAA 50 (L) 05/24/2021 1227   GFRAA 58 (L) 05/24/2021 1227    Baseline Immunosuppressant Therapy Labs       No results  found for: HIV     Serum Protein Electrophoresis Latest Ref Rng & Units 06/30/2021  Total Protein 6.1 - 8.1 g/dL 6.9  Albumin 3.8 - 4.8 g/dL 4.4  Alpha-1 0.2 - 0.3 g/dL 0.3  Alpha-2 0.5 - 0.9 g/dL 0.8  Beta Globulin 0.4 - 0.6 g/dL 0.4  Beta 2 0.2 - 0.5 g/dL 0.3  Gamma Globulin 0.8 - 1.7 g/dL 0.7(L)    Lab Results  Component Value Date   G6PDH 17.9 01/19/2021    No results found for: TPMT   Pregnancy status:  post-menopausal  Assessment/Plan:  Baseline immunosuppressive labs drawn today.  Patient was counseled on the purpose, proper use, and adverse effects of leflunomide including risk of infection, nausea/diarrhea/weight loss, increase in blood pressure, rash, hair loss, tingling in the hands and feet, and signs and symptoms of interstitial lung disease.   Also counseled on Black Box warning of liver injury and importance of avoiding alcohol while on therapy. Discussed that there is the possibility of an increased risk of malignancy but it is not well understood if this increased risk is due to the medication or the disease state.  Counseled patient to avoid live vaccines. Recommend annual influenza, Pneumovax  23, Prevnar 13, and Shingrix as indicated.   Discussed the importance of frequent monitoring of liver function and blood count.  Standing orders placed.  Discussed importance of birth control while on leflunomide due to risk of congenital abnormalities, and patient confirms she is post-menopausal.  Provided patient with educational materials on leflunomide and answered all questions.  Patient consented to [place] use, and consent will be uploaded into the media tab.    Patient dose will be leflunomide 10mg  once daily x 2 week. Will recheck labs in 2 weeks and if stable will increase to 20mg  once daily.  Prescription pending lab results and/or insurance approval.  She will continue hydroxychloroquine 200mg  twice daily on Monday to Friday only  Knox Saliva, PharmD, MPH,  BCPS Clinical Pharmacist (Rheumatology and Pulmonology)

## 2021-10-30 LAB — COMPLETE METABOLIC PANEL WITH GFR
AG Ratio: 2 (calc) (ref 1.0–2.5)
ALT: 13 U/L (ref 6–29)
AST: 15 U/L (ref 10–35)
Albumin: 4.5 g/dL (ref 3.6–5.1)
Alkaline phosphatase (APISO): 63 U/L (ref 37–153)
BUN: 18 mg/dL (ref 7–25)
CO2: 23 mmol/L (ref 20–32)
Calcium: 10 mg/dL (ref 8.6–10.4)
Chloride: 106 mmol/L (ref 98–110)
Creat: 0.88 mg/dL (ref 0.60–1.00)
Globulin: 2.3 g/dL (calc) (ref 1.9–3.7)
Glucose, Bld: 84 mg/dL (ref 65–99)
Potassium: 4.7 mmol/L (ref 3.5–5.3)
Sodium: 141 mmol/L (ref 135–146)
Total Bilirubin: 0.3 mg/dL (ref 0.2–1.2)
Total Protein: 6.8 g/dL (ref 6.1–8.1)
eGFR: 69 mL/min/{1.73_m2} (ref >=60)

## 2021-10-30 LAB — QUANTIFERON-TB GOLD PLUS
Mitogen-NIL: 4.8 IU/mL
NIL: 0.03 IU/mL
QuantiFERON-TB Gold Plus: NEGATIVE
TB1-NIL: 0 IU/mL
TB2-NIL: 0.02 IU/mL

## 2021-10-30 LAB — SEDIMENTATION RATE: Sed Rate: 2 mm/h (ref 0–30)

## 2021-10-30 LAB — CBC WITH DIFFERENTIAL/PLATELET
Absolute Monocytes: 357 cells/uL (ref 200–950)
Basophils Absolute: 30 cells/uL (ref 0–200)
Basophils Relative: 0.8 %
Eosinophils Absolute: 61 cells/uL (ref 15–500)
Eosinophils Relative: 1.6 %
HCT: 38.4 % (ref 35.0–45.0)
Hemoglobin: 12.9 g/dL (ref 11.7–15.5)
Lymphs Abs: 923 cells/uL (ref 850–3900)
MCH: 31.2 pg (ref 27.0–33.0)
MCHC: 33.6 g/dL (ref 32.0–36.0)
MCV: 92.8 fL (ref 80.0–100.0)
MPV: 10.4 fL (ref 7.5–12.5)
Monocytes Relative: 9.4 %
Neutro Abs: 2428 cells/uL (ref 1500–7800)
Neutrophils Relative %: 63.9 %
Platelets: 264 10*3/uL (ref 140–400)
RBC: 4.14 10*6/uL (ref 3.80–5.10)
RDW: 12.5 % (ref 11.0–15.0)
Total Lymphocyte: 24.3 %
WBC: 3.8 10*3/uL (ref 3.8–10.8)

## 2021-10-30 LAB — IGG, IGA, IGM
IgG (Immunoglobin G), Serum: 747 mg/dL (ref 600–1540)
IgM, Serum: 46 mg/dL — ABNORMAL LOW (ref 50–300)
Immunoglobulin A: 132 mg/dL (ref 70–320)

## 2021-10-30 LAB — HEPATITIS C ANTIBODY
Hepatitis C Ab: NONREACTIVE
SIGNAL TO CUT-OFF: 0.04 (ref <1.00)

## 2021-10-30 LAB — URINALYSIS, ROUTINE W REFLEX MICROSCOPIC
Bilirubin Urine: NEGATIVE
Glucose, UA: NEGATIVE
Hgb urine dipstick: NEGATIVE
Ketones, ur: NEGATIVE
Leukocytes,Ua: NEGATIVE
Nitrite: NEGATIVE
Protein, ur: NEGATIVE
Specific Gravity, Urine: 1.01 (ref 1.001–1.035)
pH: <=5 (ref 5.0–8.0)

## 2021-10-30 LAB — ANTI-DNA ANTIBODY, DOUBLE-STRANDED: ds DNA Ab: <1 IU/mL

## 2021-10-30 LAB — HEPATITIS B SURFACE ANTIGEN: Hepatitis B Surface Ag: NONREACTIVE

## 2021-10-30 LAB — HEPATITIS B CORE ANTIBODY, IGM: Hep B C IgM: NONREACTIVE

## 2021-10-30 LAB — C3 AND C4
C3 Complement: 137 mg/dL (ref 83–193)
C4 Complement: 33 mg/dL (ref 15–57)

## 2021-11-01 NOTE — Progress Notes (Signed)
CBC and CMP WNL.  UA normal.  Complements and ESR WNL.  dsDNA is negative. Hep B and C negative.  TB gold negative.  Igm is slightly low but rest of immunoglobulins WNL.

## 2021-11-07 ENCOUNTER — Other Ambulatory Visit: Payer: Self-pay | Admitting: Internal Medicine

## 2021-11-07 ENCOUNTER — Encounter: Payer: Self-pay | Admitting: Internal Medicine

## 2021-11-08 MED ORDER — TRAZODONE HCL 50 MG PO TABS
ORAL_TABLET | ORAL | 1 refills | Status: DC
Start: 2021-11-08 — End: 2022-01-11

## 2021-11-22 NOTE — Progress Notes (Signed)
Office Visit Note  Patient: [name]             Date of Birth: [birth year]           MRN: [id number]             PCP: Binnie Rail, MD Referring: Binnie Rail, MD Visit Date: 12/06/2021 Occupation: @GUAROCC @  Subjective:  Medication management  History of Present Illness: AYANA IMHOF is a 75 y.o. female with a history of autoimmune disease.  She states that she continues to have some stiffness in her joints.  She notices puffiness in her left knee joint but not much discomfort.  She denies any history of oral ulcers, nasal ulcers, malar rash, photosensitivity, Raynaud's phenomenon or lymphadenopathy.  She has been taking hydroxychloroquine on a regular basis.  She decided not to go on leflunomide as she is concerned about her renal function.  She has been followed by [facility].  Activities of Daily Living:  Patient reports morning stiffness for 1 hour.   Patient Denies nocturnal pain.  Difficulty dressing/grooming: Denies Difficulty climbing stairs: Reports Difficulty getting out of chair: Denies Difficulty using hands for taps, buttons, cutlery, and/or writing: Reports  Review of Systems  Constitutional:  Positive for fatigue.  HENT:  Positive for mouth dryness. Negative for mouth sores and nose dryness.   Eyes:  Positive for dryness. Negative for pain and itching.  Respiratory:  Negative for shortness of breath and difficulty breathing.   Cardiovascular:  Positive for palpitations. Negative for chest pain.  Gastrointestinal:  Negative for blood in stool, constipation and diarrhea.  Endocrine: Negative for increased urination.  Genitourinary:  Negative for difficulty urinating.  Musculoskeletal:  Positive for joint pain, joint pain, joint swelling, myalgias, morning stiffness, muscle tenderness and myalgias.  Skin:  Negative for color change, rash, redness and sensitivity to sunlight.  Allergic/Immunologic: Negative for susceptible to  infections.  Neurological:  Negative for dizziness, numbness, headaches, memory loss and weakness.  Hematological:  Positive for bruising/bleeding tendency.  Psychiatric/Behavioral:  Negative for confusion.    PMFS History:  Patient Active Problem List   Diagnosis Date Noted   Coccyx pain 05/19/2021   Abrasion 05/19/2021   B12 deficiency 04/15/2021   COVID-19 04/14/2021   Autoimmune disorder ([place]) 04/13/2021   At risk for long QT syndrome 01/21/2021   Acute cystitis without hematuria 01/21/2021   Lumbar back pain with radiculopathy affecting left lower extremity 08/05/2020   Atypical chest pain 03/10/2020   Shingles 11/19/2019   Pain in both knees 07/09/2019   Right arm pain 02/26/2019   Acute pain of left knee 08/27/2018   Hand arthritis 06/13/2018   Trigger middle finger of right hand 06/13/2018   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   Intermittent palpitations 10/11/2017   Migraine 12/30/2015   Upper airway cough syndrome 10/29/2015   Osteoporosis 06/06/2011   Vitamin D deficiency 03/22/2010   BREAST CANCER, HX OF 03/22/2010   SPINAL STENOSIS, LUMBAR 03/25/2008   HYPERLIPIDEMIA 09/28/2007   Fibromyalgia 05/24/2007    Past Medical History:  Diagnosis Date   Arthritis    Breast cancer ([place])    R lumpectomy ; radiation; no chemotherapy   Bronchitis    recurrent   Cataract    multifocal lens implant   Chronic low back pain    bulging disc & spinal stenosis   Coccygeal fracture ([place]) 05/15/2021   COVID-19    Cystitis    Fibromyalgia    Hyperlipemia  Mitral valve prolapse    Osteoporosis    Personal history of radiation therapy    Pneumonia    as OP X 2; no Pneumovax   Vitamin D deficiency    19 in  01/2009    Family History  Problem Relation Age of Onset   Atrial fibrillation Mother        S/P cardioversion   Ulcers Mother    Hypertension Mother    Dementia Mother    Heart attack Father 35       died @ 26   Hypertension Brother    Diabetes Brother     Atrial fibrillation Brother    Heart attack Paternal Aunt        < 94   Heart attack Paternal Uncle        > 23   Heart attack Paternal Grandmother        < 88   Heart attack Paternal Grandfather        > 78   Thyroid disease Daughter        on Synthroid since high school   Healthy Son    Stroke Neg Hx    Breast cancer Neg Hx    Past Surgical History:  Procedure Laterality Date   ABDOMINAL HYSTERECTOMY  1990   USO for fibroid , Dr Maryruth Eve   BREAST LUMPECTOMY Right    BREAST SURGERY  2010   R side lumpectomy; Dr Margot Chimes   CATARACT EXTRACTION, BILATERAL  2017   CESAREAN SECTION      G 2 P 2   CYSTECTOMY  2010   EYE SURGERY     eye lid surgery   FOOT SURGERY Right    no colonoscopy     "I just don't want to do it". SOC reviewed   Social History   Social History Narrative   Not on file   Immunization History  Administered Date(s) Administered   Fluad Quad(high Dose 65+) 10/02/2019   PFIZER(Purple Top)SARS-COV-2 Vaccination 01/08/2020, 01/27/2020, 10/17/2020   Td 12/20/1999     Objective: Vital Signs: BP (!) 142/87 (BP Location: Left Arm, Patient Position: Sitting, Cuff Size: Normal)   Pulse 70   Ht 5' 4.5" (1.638 m)   Wt 130 lb (59 kg)   BMI 21.97 kg/m    Physical Exam Vitals and nursing note reviewed.  Constitutional:      Appearance: She is well-developed.  HENT:     Head: Normocephalic and atraumatic.  Eyes:     Conjunctiva/sclera: Conjunctivae normal.  Cardiovascular:     Rate and Rhythm: Normal rate and regular rhythm.     Heart sounds: Normal heart sounds.  Pulmonary:     Effort: Pulmonary effort is normal.     Breath sounds: Normal breath sounds.  Abdominal:     General: Bowel sounds are normal.     Palpations: Abdomen is soft.  Musculoskeletal:     Cervical back: Normal range of motion.  Lymphadenopathy:     Cervical: No cervical adenopathy.  Skin:    General: Skin is warm and dry.     Capillary Refill: Capillary refill takes less than 2  seconds.  Neurological:     Mental Status: She is alert and oriented to person, place, and time.  Psychiatric:        Behavior: Behavior normal.     Musculoskeletal Exam: C-spine was in good range of motion.  Shoulder joints, elbow joints, wrist joints with good range of motion.  She had bilateral  PIP and DIP thickening with no synovitis.  She had no triggering of her right middle finger today.  Hip joints and knee joints in good range of motion.  She had an small effusion in her left knee joint without any warmth.  There was no tenderness over ankles or MTPs.  CDAI Exam: CDAI Score: -- Patient Global: --; Provider Global: -- Swollen: 0 ; Tender: 0  Joint Exam 12/06/2021   No joint exam has been documented for this visit   There is currently no information documented on the homunculus. Go to the Rheumatology activity and complete the homunculus joint exam.  Investigation: No additional findings.  Imaging: No results found.  Recent Labs: Lab Results  Component Value Date   WBC 3.8 10/27/2021   HGB 12.9 10/27/2021   PLT 264 10/27/2021   NA 141 10/27/2021   K 4.7 10/27/2021   CL 106 10/27/2021   CO2 23 10/27/2021   GLUCOSE 84 10/27/2021   BUN 18 10/27/2021   CREATININE 0.88 10/27/2021   BILITOT 0.3 10/27/2021   ALKPHOS 63 04/17/2020   AST 15 10/27/2021   ALT 13 10/27/2021   PROT 6.8 10/27/2021   ALBUMIN 4.6 04/17/2020   CALCIUM 10.0 10/27/2021   GFRAA 58 (L) 05/24/2021   QFTBGOLDPLUS NEGATIVE 10/27/2021    Speciality Comments: PLQ Eye Exam: 05/28/2021 WNL @ [facility] Follow up in 6 months.  Procedures:  No procedures performed Allergies: Morphine, Albuterol, Augmentin [amoxicillin-pot clavulanate], Ciprofloxacin, Fish allergy, Gabapentin, Risedronate sodium, Cyclobenzaprine, and Ibandronate sodium   Assessment / Plan:     Visit Diagnoses: Autoimmune disease ([place]) - ANA 1: 640NS, history of dry eyes, hair loss, Raynaud's, inflammatory arthritis: She is  clinically doing well on hydroxychloroquine.  She continues to have some joint stiffness and left knee joint effusion.  She decided not to start on leflunomide as she did not like the side effects.  She states she never took the prednisone after the last visit.  Labs obtained recently were reviewed which were within normal limits.  High risk medication use -  hydroxychloroquine 200 mg 1 tablet by mouth twice daily Monday to Friday. PLQ Eye Exam: 05/28/2021.  Information regarding immunization was placed in the AVS.  Primary osteoarthritis of both hands-she had bilateral PIP and DIP thickening.  Joint protection muscle strengthening was discussed.  Trigger middle finger of right hand-she has intermittent symptoms.  Primary osteoarthritis of right hip-she has some limitation range of motion without discomfort.  Effusion, left knee - MRI of left knee 10/15/21: Intact ligamentous structures and no acute bony findings.  No meniscal tears.  Tricompartmental degenerative changes most significant.  She has a small effusion without any warmth on palpation.  Primary osteoarthritis of both feet-she is chronically stiffness.  DDD (degenerative disc disease), lumbar -she is followed by Dr. Lorin Mercy.  Closed fracture of coccyx with routine healing, subsequent encounter  Age-related osteoporosis without current pathological fracture - 04/23/21 DEXA scan showed left femoral neck T score -2.7.  Intolerance to Fosamax and Boniva in the past.  Patient declined prolia and reclast in the past.  I had a detailed discussion again regarding subcutaneous Prolia.  Indications side effects contraindications were discussed.  She would like to hold off treatment at this point.  She would like to have repeat DEXA in 2 years and then make a decision.  Vitamin D deficiency-vitamin D supplement was advised.  Fibromyalgia-she continues to have some generalized pain and discomfort.  Other fatigue  Elevated serum  creatinine-followed  by [facility].  Hx of migraines  History of breast cancer  Hair loss  Intermittent palpitations  Vitamin B12 deficiency  History of shingles  Orders: No orders of the defined types were placed in this encounter.  No orders of the defined types were placed in this encounter.    Follow-Up Instructions: Return in about 5 months (around 05/06/2022) for Autoimmune disease.   Bo Merino, MD  Note - This record has been created using Editor, commissioning.  Chart creation errors have been sought, but may not always  have been located. Such creation errors do not reflect on  the standard of medical care.

## 2021-11-27 ENCOUNTER — Other Ambulatory Visit: Payer: Self-pay | Admitting: Nephrology

## 2021-11-27 DIAGNOSIS — N1831 Chronic kidney disease, stage 3a: Secondary | ICD-10-CM

## 2021-12-06 ENCOUNTER — Ambulatory Visit (INDEPENDENT_AMBULATORY_CARE_PROVIDER_SITE_OTHER): Payer: BC Managed Care – PPO | Admitting: Rheumatology

## 2021-12-06 ENCOUNTER — Other Ambulatory Visit: Payer: Self-pay

## 2021-12-06 ENCOUNTER — Encounter: Payer: Self-pay | Admitting: Rheumatology

## 2021-12-06 ENCOUNTER — Telehealth: Payer: Self-pay | Admitting: Rheumatology

## 2021-12-06 VITALS — BP 142/87 | HR 70 | Ht 64.5 in | Wt 130.0 lb

## 2021-12-06 DIAGNOSIS — M810 Age-related osteoporosis without current pathological fracture: Secondary | ICD-10-CM

## 2021-12-06 DIAGNOSIS — E538 Deficiency of other specified B group vitamins: Secondary | ICD-10-CM

## 2021-12-06 DIAGNOSIS — R5383 Other fatigue: Secondary | ICD-10-CM

## 2021-12-06 DIAGNOSIS — L659 Nonscarring hair loss, unspecified: Secondary | ICD-10-CM

## 2021-12-06 DIAGNOSIS — Z8669 Personal history of other diseases of the nervous system and sense organs: Secondary | ICD-10-CM

## 2021-12-06 DIAGNOSIS — M19041 Primary osteoarthritis, right hand: Secondary | ICD-10-CM

## 2021-12-06 DIAGNOSIS — M19042 Primary osteoarthritis, left hand: Secondary | ICD-10-CM

## 2021-12-06 DIAGNOSIS — M5136 Other intervertebral disc degeneration, lumbar region: Secondary | ICD-10-CM

## 2021-12-06 DIAGNOSIS — E559 Vitamin D deficiency, unspecified: Secondary | ICD-10-CM

## 2021-12-06 DIAGNOSIS — M797 Fibromyalgia: Secondary | ICD-10-CM

## 2021-12-06 DIAGNOSIS — M65331 Trigger finger, right middle finger: Secondary | ICD-10-CM | POA: Diagnosis not present

## 2021-12-06 DIAGNOSIS — S322XXD Fracture of coccyx, subsequent encounter for fracture with routine healing: Secondary | ICD-10-CM

## 2021-12-06 DIAGNOSIS — Z853 Personal history of malignant neoplasm of breast: Secondary | ICD-10-CM

## 2021-12-06 DIAGNOSIS — M25462 Effusion, left knee: Secondary | ICD-10-CM

## 2021-12-06 DIAGNOSIS — M359 Systemic involvement of connective tissue, unspecified: Secondary | ICD-10-CM | POA: Diagnosis not present

## 2021-12-06 DIAGNOSIS — R7989 Other specified abnormal findings of blood chemistry: Secondary | ICD-10-CM

## 2021-12-06 DIAGNOSIS — M19072 Primary osteoarthritis, left ankle and foot: Secondary | ICD-10-CM

## 2021-12-06 DIAGNOSIS — M51369 Other intervertebral disc degeneration, lumbar region without mention of lumbar back pain or lower extremity pain: Secondary | ICD-10-CM

## 2021-12-06 DIAGNOSIS — Z79899 Other long term (current) drug therapy: Secondary | ICD-10-CM

## 2021-12-06 DIAGNOSIS — Z8619 Personal history of other infectious and parasitic diseases: Secondary | ICD-10-CM

## 2021-12-06 DIAGNOSIS — M1611 Unilateral primary osteoarthritis, right hip: Secondary | ICD-10-CM

## 2021-12-06 DIAGNOSIS — M19071 Primary osteoarthritis, right ankle and foot: Secondary | ICD-10-CM

## 2021-12-06 DIAGNOSIS — R002 Palpitations: Secondary | ICD-10-CM

## 2021-12-06 NOTE — Patient Instructions (Addendum)
Standing Labs We placed an order today for your standing lab work.   Please have your standing labs drawn in April  If possible, please have your labs drawn 2 weeks prior to your appointment so that the provider can discuss your results at your appointment.  Please note that you may see your imaging and lab results in [facility] before we have reviewed them. We may be awaiting multiple results to interpret others before contacting you. Please allow our office up to 72 hours to thoroughly review all of the results before contacting the office for clarification of your results.  We have open lab daily: Monday through Thursday from 1:30-4:30 PM and Friday from 1:30-4:00 PM at the office of Dr. Bo Merino, [facility].   Please be advised, all patients with office appointments requiring lab work will take precedent over walk-in lab work.  If possible, please come for your lab work on Monday and Friday afternoons, as you may experience shorter wait times. The office is located at [address], [place], [place], [place] [address] No appointment is necessary.   Labs are drawn by [facility]. Please bring your co-pay at the time of your lab draw.  You may receive a bill from [facility] for your lab work.  If you wish to have your labs drawn at another location, please call the office 24 hours in advance to send orders.  If you have any questions regarding directions or hours of operation,  please call [phone].   As a reminder, please drink plenty of water prior to coming for your lab work. Thanks!   Vaccines You are taking a medication(s) that can suppress your immune system.  The following immunizations are recommended: Flu annually Covid-19  Td/Tdap (tetanus, diphtheria, pertussis) every 10 years Pneumonia (Prevnar 15 then Pneumovax 23 at least 1 year apart.  Alternatively, can take Prevnar 20 without needing additional dose) Shingrix: 2 doses from 4 weeks to 6 months  apart  Please check with your PCP to make sure you are up to date.

## 2021-12-06 NOTE — Telephone Encounter (Signed)
Patient called the office stating she had an appointment this morning with Dr. Estanislado Pandy and she forgot to ask if she needs to continue Vitamin b12 and vitamin d3.

## 2021-12-06 NOTE — Telephone Encounter (Signed)
Ok to continue maintenance dose of vitamin D.   Vitamin B12 level has not been rechecked recently. Please advise the patient to reach out to Dr. Quay Burow to clarify if she would like the patient to continue on the current dose of vitamin B12.

## 2021-12-06 NOTE — Telephone Encounter (Signed)
Advised patient Ok to continue maintenance dose of vitamin D.   Vitamin B12 level has not been rechecked recently. advised the patient to reach out to Dr. Quay Burow to clarify if she would like the patient to continue on the current dose of vitamin B12. Patient verbalized understanding.

## 2021-12-14 ENCOUNTER — Ambulatory Visit: Payer: BC Managed Care – PPO | Admitting: Physician Assistant

## 2021-12-23 ENCOUNTER — Ambulatory Visit
Admission: RE | Admit: 2021-12-23 | Discharge: 2021-12-23 | Disposition: A | Discharge status: Home / Self Care | Payer: BC Managed Care – PPO | Source: Ambulatory Visit | Attending: Nephrology | Admitting: Nephrology

## 2021-12-23 DIAGNOSIS — N1831 Chronic kidney disease, stage 3a: Secondary | ICD-10-CM

## 2021-12-23 DIAGNOSIS — N183 Chronic kidney disease, stage 3 unspecified: Secondary | ICD-10-CM | POA: Diagnosis not present

## 2021-12-26 ENCOUNTER — Other Ambulatory Visit: Payer: Self-pay | Admitting: Internal Medicine

## 2021-12-29 ENCOUNTER — Other Ambulatory Visit: Payer: Self-pay | Admitting: Physician Assistant

## 2021-12-29 NOTE — Telephone Encounter (Signed)
Next Visit: 05/09/2022  Last Visit: 12/06/2021  Labs: 10/27/2021 CBC and CMP WNL.   Eye exam: 05/28/2021.   Current Dose per office note 12/06/2021: hydroxychloroquine 200 mg 1 tablet by mouth twice daily Monday to Friday  [id number] disease   Last Fill: 09/27/2021  Okay to refill Plaquenil?

## 2021-12-30 DIAGNOSIS — Z79899 Other long term (current) drug therapy: Secondary | ICD-10-CM | POA: Diagnosis not present

## 2022-01-11 ENCOUNTER — Other Ambulatory Visit: Payer: Self-pay | Admitting: Internal Medicine

## 2022-02-17 ENCOUNTER — Encounter: Payer: Self-pay | Admitting: Internal Medicine

## 2022-02-18 MED ORDER — TRAMADOL HCL 50 MG PO TABS: 50.0000 mg | ORAL_TABLET | Freq: Two times a day (BID) | ORAL | 0 refills | Status: DC | PRN

## 2022-02-28 DIAGNOSIS — M25562 Pain in left knee: Secondary | ICD-10-CM | POA: Diagnosis not present

## 2022-02-28 DIAGNOSIS — M25552 Pain in left hip: Secondary | ICD-10-CM | POA: Diagnosis not present

## 2022-02-28 DIAGNOSIS — M545 Low back pain, unspecified: Secondary | ICD-10-CM | POA: Diagnosis not present

## 2022-03-03 DIAGNOSIS — N390 Urinary tract infection, site not specified: Secondary | ICD-10-CM | POA: Diagnosis not present

## 2022-03-03 DIAGNOSIS — D8989 Other specified disorders involving the immune mechanism, not elsewhere classified: Secondary | ICD-10-CM | POA: Diagnosis not present

## 2022-03-03 DIAGNOSIS — N1831 Chronic kidney disease, stage 3a: Secondary | ICD-10-CM | POA: Diagnosis not present

## 2022-03-05 DIAGNOSIS — M545 Low back pain, unspecified: Secondary | ICD-10-CM | POA: Diagnosis not present

## 2022-03-07 DIAGNOSIS — M545 Low back pain, unspecified: Secondary | ICD-10-CM | POA: Diagnosis not present

## 2022-03-22 DIAGNOSIS — M5126 Other intervertebral disc displacement, lumbar region: Secondary | ICD-10-CM | POA: Diagnosis not present

## 2022-03-22 DIAGNOSIS — M5416 Radiculopathy, lumbar region: Secondary | ICD-10-CM | POA: Diagnosis not present

## 2022-03-28 DIAGNOSIS — M5416 Radiculopathy, lumbar region: Secondary | ICD-10-CM | POA: Diagnosis not present

## 2022-04-06 ENCOUNTER — Encounter: Payer: Self-pay | Admitting: Internal Medicine

## 2022-04-06 DIAGNOSIS — M810 Age-related osteoporosis without current pathological fracture: Secondary | ICD-10-CM

## 2022-04-06 DIAGNOSIS — Z0000 Encounter for general adult medical examination without abnormal findings: Secondary | ICD-10-CM

## 2022-04-06 DIAGNOSIS — E538 Deficiency of other specified B group vitamins: Secondary | ICD-10-CM

## 2022-04-06 DIAGNOSIS — E559 Vitamin D deficiency, unspecified: Secondary | ICD-10-CM

## 2022-04-06 DIAGNOSIS — E782 Mixed hyperlipidemia: Secondary | ICD-10-CM

## 2022-04-06 DIAGNOSIS — R002 Palpitations: Secondary | ICD-10-CM

## 2022-04-11 ENCOUNTER — Other Ambulatory Visit (INDEPENDENT_AMBULATORY_CARE_PROVIDER_SITE_OTHER): Payer: BC Managed Care – PPO

## 2022-04-11 ENCOUNTER — Telehealth: Payer: Self-pay | Admitting: Internal Medicine

## 2022-04-11 ENCOUNTER — Encounter: Payer: Self-pay | Admitting: Internal Medicine

## 2022-04-11 DIAGNOSIS — M810 Age-related osteoporosis without current pathological fracture: Secondary | ICD-10-CM | POA: Diagnosis not present

## 2022-04-11 DIAGNOSIS — E559 Vitamin D deficiency, unspecified: Secondary | ICD-10-CM

## 2022-04-11 DIAGNOSIS — E538 Deficiency of other specified B group vitamins: Secondary | ICD-10-CM | POA: Diagnosis not present

## 2022-04-11 DIAGNOSIS — E782 Mixed hyperlipidemia: Secondary | ICD-10-CM

## 2022-04-11 DIAGNOSIS — Z0000 Encounter for general adult medical examination without abnormal findings: Secondary | ICD-10-CM | POA: Diagnosis not present

## 2022-04-11 DIAGNOSIS — R002 Palpitations: Secondary | ICD-10-CM

## 2022-04-11 LAB — LIPID PANEL
Cholesterol: 218 mg/dL — ABNORMAL HIGH (ref 0–200)
HDL: 67.7 mg/dL (ref >39.00)
LDL Cholesterol: 128 mg/dL — ABNORMAL HIGH (ref 0–99)
NonHDL: 150.41
Total CHOL/HDL Ratio: 3
Triglycerides: 110 mg/dL (ref 0.0–149.0)
VLDL: 22 mg/dL (ref 0.0–40.0)

## 2022-04-11 LAB — CBC WITH DIFFERENTIAL/PLATELET
Basophils Absolute: 0 10*3/uL (ref 0.0–0.1)
Basophils Relative: 0.7 % (ref 0.0–3.0)
Eosinophils Absolute: 0.2 10*3/uL (ref 0.0–0.7)
Eosinophils Relative: 4.3 % (ref 0.0–5.0)
HCT: 38.6 % (ref 36.0–46.0)
Hemoglobin: 12.8 g/dL (ref 12.0–15.0)
Lymphocytes Relative: 16.9 % (ref 12.0–46.0)
Lymphs Abs: 0.9 10*3/uL (ref 0.7–4.0)
MCHC: 33.1 g/dL (ref 30.0–36.0)
MCV: 91.3 fl (ref 78.0–100.0)
Monocytes Absolute: 0.4 10*3/uL (ref 0.1–1.0)
Monocytes Relative: 7 % (ref 3.0–12.0)
Neutro Abs: 4 10*3/uL (ref 1.4–7.7)
Neutrophils Relative %: 71.1 % (ref 43.0–77.0)
Platelets: 235 10*3/uL (ref 150.0–400.0)
RBC: 4.23 Mil/uL (ref 3.87–5.11)
RDW: 13.9 % (ref 11.5–15.5)
WBC: 5.6 10*3/uL (ref 4.0–10.5)

## 2022-04-11 LAB — VITAMIN B12: Vitamin B-12: >1504 pg/mL — ABNORMAL HIGH (ref 211–911)

## 2022-04-11 LAB — COMPREHENSIVE METABOLIC PANEL
ALT: 13 U/L (ref 0–35)
AST: 16 U/L (ref 0–37)
Albumin: 4.4 g/dL (ref 3.5–5.2)
Alkaline Phosphatase: 61 U/L (ref 39–117)
BUN: 16 mg/dL (ref 6–23)
CO2: 26 mEq/L (ref 19–32)
Calcium: 9.3 mg/dL (ref 8.4–10.5)
Chloride: 107 mEq/L (ref 96–112)
Creatinine, Ser: 1.04 mg/dL (ref 0.40–1.20)
GFR: 52.71 mL/min — ABNORMAL LOW (ref >60.00)
Glucose, Bld: 97 mg/dL (ref 70–99)
Potassium: 4.3 mEq/L (ref 3.5–5.1)
Sodium: 142 mEq/L (ref 135–145)
Total Bilirubin: 0.5 mg/dL (ref 0.2–1.2)
Total Protein: 6.9 g/dL (ref 6.0–8.3)

## 2022-04-11 LAB — VITAMIN D 25 HYDROXY (VIT D DEFICIENCY, FRACTURES): VITD: 53.63 ng/mL (ref 30.00–100.00)

## 2022-04-11 LAB — TSH: TSH: 3.67 u[IU]/mL (ref 0.35–5.50)

## 2022-04-11 NOTE — Telephone Encounter (Signed)
Report placed in folder for Dr. Quay Burow to review ?

## 2022-04-11 NOTE — Telephone Encounter (Signed)
PT visits today with MRI results to be viewed by Dr.Burns prior to her appointment on Friday! Results have been left in Dr.Burns mail box! ?

## 2022-04-12 DIAGNOSIS — M5416 Radiculopathy, lumbar region: Secondary | ICD-10-CM | POA: Diagnosis not present

## 2022-04-14 ENCOUNTER — Encounter: Payer: Self-pay | Admitting: Internal Medicine

## 2022-04-14 DIAGNOSIS — N289 Disorder of kidney and ureter, unspecified: Secondary | ICD-10-CM | POA: Insufficient documentation

## 2022-04-14 NOTE — Progress Notes (Signed)
? ? ?Subjective:  ? ? Patient ID: [name], female    DOB: 08/27/1946, 76 y.o.   MRN: [id number] ? ? ?This visit occurred during the SARS-CoV-2 public health emergency.  Safety protocols were in place, including screening questions prior to the visit, additional usage of staff PPE, and extensive cleaning of exam room while observing appropriate contact time as indicated for disinfecting solutions. ? ? ? ?HPI ?[name] is here for  ?Chief Complaint  ?Patient presents with  ? Annual Exam  ? ? ?No concerns. ? ? ? ?Medications and allergies reviewed with patient and updated if appropriate ? ? ?Current Outpatient Medications on File Prior to Visit  ?Medication Sig Dispense Refill  ? acetaminophen (TYLENOL) 500 MG tablet Take 1,000 mg by mouth daily as needed for mild pain.    ? amLODipine (NORVASC) 5 MG tablet TAKE 1 TABLET(5 MG) BY MOUTH DAILY 90 tablet 3  ? CALCIUM PO Take by mouth daily.    ? cholecalciferol (VITAMIN D3) 25 MCG (1000 UNIT) tablet Take 1,000 Units by mouth daily.    ? CRANBERRY PO Take by mouth.    ? Cyanocobalamin (B-12 PO) Take by mouth.    ? Cyanocobalamin (VITAMIN B-12 PO) Take by mouth daily.    ? eletriptan (RELPAX) 20 MG tablet TAKE 1 TABLET BY MOUTH EVERY 2 HOURS AS NEEDED FOR HEADACHE OR MIGRAINE. MAY REPEAT IN 2 HOURS IF HEADACHE PERSISTS OR RECURS. 9 tablet 1  ? hydroxychloroquine (PLAQUENIL) 200 MG tablet TAKE 1 TABLET BY MOUTH TWICE DAILY MONDAY THROUGH FRIDAY 120 tablet 0  ? Probiotic Product (PROBIOTIC DAILY PO) Take by mouth.    ? traMADol (ULTRAM) 50 MG tablet Take 1 tablet (50 mg total) by mouth every 12 (twelve) hours as needed. 60 tablet 0  ? traZODone (DESYREL) 50 MG tablet TAKE 3 TABLETS(150 MG) BY MOUTH AT BEDTIME 270 tablet 1  ? ?No current facility-administered medications on file prior to visit.  ? ? ?Review of Systems  ?Constitutional:  Negative for fever.  ?Eyes:  Negative for visual disturbance.  ?Respiratory:  Negative for cough, shortness of breath and wheezing.    ?Cardiovascular:  Positive for leg swelling (mild). Negative for chest pain and palpitations.  ?Gastrointestinal:  Negative for abdominal pain, blood in stool, constipation, diarrhea and nausea.  ?     No gerd  ?Genitourinary:  Negative for dysuria.  ?Musculoskeletal:  Positive for arthralgias and back pain.  ?Skin:  Negative for rash.  ?Neurological:  Negative for light-headedness and headaches.  ?Psychiatric/Behavioral:  Positive for sleep disturbance. Negative for dysphoric mood. The patient is not nervous/anxious.   ? ?   ?Objective:  ? ?Vitals:  ? 04/15/22 1257  ?BP: 122/78  ?Pulse: 66  ?Temp: 98.3 ?F (36.8 ?C)  ?SpO2: 97%  ? ?Filed Weights  ? ?Body mass index is 21.97 kg/m?. ? ?BP Readings from Last 3 Encounters:  ?04/15/22 122/78  ?12/06/21 (!) 142/87  ?10/27/21 (!) 142/89  ? ? ?Wt Readings from Last 3 Encounters:  ?12/06/21 130 lb (59 kg)  ?10/27/21 128 lb (58.1 kg)  ?06/30/21 127 lb (57.6 kg)  ? ? ? ?  04/15/2022  ?  1:03 PM 04/14/2021  ?  3:17 PM 03/10/2020  ? 10:00 AM 01/14/2019  ?  2:14 PM 10/11/2017  ?  2:14 PM  ?Depression screen PHQ 2/9  ?Decreased Interest 0 0 0 0 0  ?Down, Depressed, Hopeless 0 0 0 0 0  ?PHQ - 2 Score 0 0 0 0  0  ? ? ? ?   ? View : No data to display.  ?  ?  ?  ? ? ? ? ?  ?Physical Exam ?Constitutional: She appears well-developed and well-nourished. No distress.  ?HENT:  ?Head: Normocephalic and atraumatic.  ?Right Ear: External ear normal. Normal ear canal and TM ?Left Ear: External ear normal.  Normal ear canal and TM ?Mouth/Throat: Oropharynx is clear and moist.  ?Eyes: Conjunctivae and EOM are normal.  ?Neck: Neck supple. No tracheal deviation present. No thyromegaly present.  ?No carotid bruit  ?Cardiovascular: Normal rate, regular rhythm and normal heart sounds.   ?No murmur heard.  No edema. ?Pulmonary/Chest: Effort normal and breath sounds normal. No respiratory distress. She has no wheezes. She has no rales.  ?Breast: deferred   ?Abdominal: Soft. She exhibits no distension.  There is no tenderness.  ?Lymphadenopathy: She has no cervical adenopathy.  ?Skin: Skin is warm and dry. She is not diaphoretic.  ?Psychiatric: She has a normal mood and affect. Her behavior is normal.  ? ? ? ?Lab Results  ?Component Value Date  ? WBC 5.6 04/11/2022  ? HGB 12.8 04/11/2022  ? HCT 38.6 04/11/2022  ? PLT 235.0 04/11/2022  ? GLUCOSE 97 04/11/2022  ? CHOL 218 (H) 04/11/2022  ? TRIG 110.0 04/11/2022  ? HDL 67.70 04/11/2022  ? LDLDIRECT 122.6 06/03/2013  ? [place] 128 (H) 04/11/2022  ? ALT 13 04/11/2022  ? AST 16 04/11/2022  ? NA 142 04/11/2022  ? K 4.3 04/11/2022  ? CL 107 04/11/2022  ? CREATININE 1.04 04/11/2022  ? BUN 16 04/11/2022  ? CO2 26 04/11/2022  ? TSH 3.67 04/11/2022  ? HGBA1C 5.2 03/06/2020  ? ? ?The 10-year ASCVD risk score (Arnett DK, et al., 2019) is: 19.5% ?  Values used to calculate the score: ?    Age: 35 years ?    Sex: Female ?    Is Non-Hispanic African American: No ?    Diabetic: No ?    Tobacco smoker: No ?    Systolic Blood Pressure: 378 mmHg ?    Is BP treated: Yes ?    HDL Cholesterol: 67.7 mg/dL ?    Total Cholesterol: 218 mg/dL ? ? ?   ?Assessment & Plan:  ? ?Physical exam: ?Screening blood work  reviewed ?Exercise  regular - limited due to pain ?Weight  normal ?Substance abuse  none ? ? ?Reviewed recommended immunizations. ? ? ?Health Maintenance  ?Topic Date Due  ? COVID-19 Vaccine (4 - Booster for [place] series) 05/01/2022 (Originally 12/12/2020)  ? Pneumonia Vaccine 34+ Years old (1 - PCV) 04/16/2023 (Originally 02/21/2012)  ? COLONOSCOPY (Pts 45-20yr Insurance coverage will need to be confirmed)  04/16/2023 (Originally 02/21/1992)  ? TETANUS/TDAP  04/16/2023 (Originally 12/19/2009)  ? INFLUENZA VACCINE  07/19/2022  ? DEXA SCAN  04/24/2023  ? Hepatitis C Screening  Completed  ? HPV VACCINES  Aged Out  ? Zoster Vaccines- Shingrix  Discontinued  ?  ? ? ? ? ? ? ?See Problem List for Assessment and Plan of chronic medical problems. ? ? ? ? ?

## 2022-04-14 NOTE — Patient Instructions (Addendum)
? ? ? ? ?Medications changes include :   none ? ? ? ?Return in about 6 months (around 10/15/2022) for 6 month f/u. ? ? ?Health Maintenance, Female ?Adopting a healthy lifestyle and getting preventive care are important in promoting health and wellness. Ask your health care provider about: ?The right schedule for you to have regular tests and exams. ?Things you can do on your own to prevent diseases and keep yourself healthy. ?What should I know about diet, weight, and exercise? ?Eat a healthy diet ? ?Eat a diet that includes plenty of vegetables, fruits, low-fat dairy products, and lean protein. ?Do not eat a lot of foods that are high in solid fats, added sugars, or sodium. ?Maintain a healthy weight ?Body mass index (BMI) is used to identify weight problems. It estimates body fat based on height and weight. Your health care provider can help determine your BMI and help you achieve or maintain a healthy weight. ?Get regular exercise ?Get regular exercise. This is one of the most important things you can do for your health. Most adults should: ?Exercise for at least 150 minutes each week. The exercise should increase your heart rate and make you sweat (moderate-intensity exercise). ?Do strengthening exercises at least twice a week. This is in addition to the moderate-intensity exercise. ?Spend less time sitting. Even light physical activity can be beneficial. ?Watch cholesterol and blood lipids ?Have your blood tested for lipids and cholesterol at 75 years of age, then have this test every 5 years. ?Have your cholesterol levels checked more often if: ?Your lipid or cholesterol levels are high. ?You are older than 75 years of age. ?You are at high risk for heart disease. ?What should I know about cancer screening? ?Depending on your health history and family history, you may need to have cancer screening at various ages. This may include screening for: ?Breast cancer. ?Cervical cancer. ?Colorectal cancer. ?Skin  cancer. ?Lung cancer. ?What should I know about heart disease, diabetes, and high blood pressure? ?Blood pressure and heart disease ?High blood pressure causes heart disease and increases the risk of stroke. This is more likely to develop in people who have high blood pressure readings or are overweight. ?Have your blood pressure checked: ?Every 3-5 years if you are 4-57 years of age. ?Every year if you are 70 years old or older. ?Diabetes ?Have regular diabetes screenings. This checks your fasting blood sugar level. Have the screening done: ?Once every three years after age 58 if you are at a normal weight and have a low risk for diabetes. ?More often and at a younger age if you are overweight or have a high risk for diabetes. ?What should I know about preventing infection? ?Hepatitis B ?If you have a higher risk for hepatitis B, you should be screened for this virus. Talk with your health care provider to find out if you are at risk for hepatitis B infection. ?Hepatitis C ?Testing is recommended for: ?Everyone born from [birth year] through 1964. ?Anyone with known risk factors for hepatitis C. ?Sexually transmitted infections (STIs) ?Get screened for STIs, including gonorrhea and chlamydia, if: ?You are sexually active and are younger than 75 years of age. ?You are older than 75 years of age and your health care provider tells you that you are at risk for this type of infection. ?Your sexual activity has changed since you were last screened, and you are at increased risk for chlamydia or gonorrhea. Ask your health care provider if you are at risk. ?  Ask your health care provider about whether you are at high risk for HIV. Your health care provider may recommend a prescription medicine to help prevent HIV infection. If you choose to take medicine to prevent HIV, you should first get tested for HIV. You should then be tested every 3 months for as long as you are taking the medicine. ?Pregnancy ?If you are about to stop  having your period (premenopausal) and you may become pregnant, seek counseling before you get pregnant. ?Take 400 to 800 micrograms (mcg) of folic acid every day if you become pregnant. ?Ask for birth control (contraception) if you want to prevent pregnancy. ?Osteoporosis and menopause ?Osteoporosis is a disease in which the bones lose minerals and strength with aging. This can result in bone fractures. If you are 32 years old or older, or if you are at risk for osteoporosis and fractures, ask your health care provider if you should: ?Be screened for bone loss. ?Take a calcium or vitamin D supplement to lower your risk of fractures. ?Be given hormone replacement therapy (HRT) to treat symptoms of menopause. ?Follow these instructions at home: ?Alcohol use ?Do not drink alcohol if: ?Your health care provider tells you not to drink. ?You are pregnant, may be pregnant, or are planning to become pregnant. ?If you drink alcohol: ?Limit how much you have to: ?0-1 drink a day. ?Know how much alcohol is in your drink. In the [place] one drink equals one 12 oz bottle of beer (355 mL), one 5 oz glass of wine (148 mL), or one 1? oz glass of hard liquor (44 mL). ?Lifestyle ?Do not use any products that contain nicotine or tobacco. These products include cigarettes, chewing tobacco, and vaping devices, such as e-cigarettes. If you need help quitting, ask your health care provider. ?Do not use street drugs. ?Do not share needles. ?Ask your health care provider for help if you need support or information about quitting drugs. ?General instructions ?Schedule regular health, dental, and eye exams. ?Stay current with your vaccines. ?Tell your health care provider if: ?You often feel depressed. ?You have ever been abused or do not feel safe at home. ?Summary ?Adopting a healthy lifestyle and getting preventive care are important in promoting health and wellness. ?Follow your health care provider's instructions about healthy diet,  exercising, and getting tested or screened for diseases. ?Follow your health care provider's instructions on monitoring your cholesterol and blood pressure. ?This information is not intended to replace advice given to you by your health care provider. Make sure you discuss any questions you have with your health care provider. ?Document Revised: 04/26/2021 Document Reviewed: 04/26/2021 ?Elsevier Patient Education ? [employer]. ? ?

## 2022-04-14 NOTE — Assessment & Plan Note (Addendum)
MRI for back showed 1.8 cm T2 hypointense and midly T1 hyperintense exophytic lesion arising from lower pole of left kidney - hemorrhagic or proteinaceous cyst ?Renal US done by nephro --- no concerns - cyst ?

## 2022-04-14 NOTE — Assessment & Plan Note (Signed)
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

## 2022-04-15 ENCOUNTER — Ambulatory Visit (INDEPENDENT_AMBULATORY_CARE_PROVIDER_SITE_OTHER): Payer: BC Managed Care – PPO | Admitting: Internal Medicine

## 2022-04-15 VITALS — BP 122/78 | HR 66 | Temp 98.3°F | Ht 64.5 in

## 2022-04-15 DIAGNOSIS — G43909 Migraine, unspecified, not intractable, without status migrainosus: Secondary | ICD-10-CM

## 2022-04-15 DIAGNOSIS — M797 Fibromyalgia: Secondary | ICD-10-CM

## 2022-04-15 DIAGNOSIS — M5416 Radiculopathy, lumbar region: Secondary | ICD-10-CM

## 2022-04-15 DIAGNOSIS — M810 Age-related osteoporosis without current pathological fracture: Secondary | ICD-10-CM | POA: Diagnosis not present

## 2022-04-15 DIAGNOSIS — Z0000 Encounter for general adult medical examination without abnormal findings: Secondary | ICD-10-CM | POA: Diagnosis not present

## 2022-04-15 DIAGNOSIS — N289 Disorder of kidney and ureter, unspecified: Secondary | ICD-10-CM

## 2022-04-15 DIAGNOSIS — E538 Deficiency of other specified B group vitamins: Secondary | ICD-10-CM | POA: Diagnosis not present

## 2022-04-15 DIAGNOSIS — M48061 Spinal stenosis, lumbar region without neurogenic claudication: Secondary | ICD-10-CM

## 2022-04-15 DIAGNOSIS — E782 Mixed hyperlipidemia: Secondary | ICD-10-CM

## 2022-04-15 DIAGNOSIS — E559 Vitamin D deficiency, unspecified: Secondary | ICD-10-CM

## 2022-04-15 NOTE — Assessment & Plan Note (Signed)
Chronic ?Cholesterol looks good - likely low risk ?Elevated ascvd risk  ?Would like to avoid medication ?Continue healthy diet, exercise ?

## 2022-04-15 NOTE — Assessment & Plan Note (Signed)
Chronic ?Continue vitamin d daily ?

## 2022-04-15 NOTE — Assessment & Plan Note (Signed)
Chronic ?Exercising as much as possible ?

## 2022-04-15 NOTE — Assessment & Plan Note (Addendum)
Chronic ?Improved with epidural but still with symptoms ?Taking tylenol or tramadol as needed ?If pain persists will see ortho again ? ?

## 2022-04-15 NOTE — Assessment & Plan Note (Signed)
Chronic ?dexa up to date ?Exercising ?Continue calcium and vitamin d ?

## 2022-04-15 NOTE — Assessment & Plan Note (Signed)
Chronic ?Rarely has migraines ?

## 2022-04-15 NOTE — Assessment & Plan Note (Signed)
Chronic ?B12 level high  ?Will take TIW ?

## 2022-04-18 DIAGNOSIS — M25511 Pain in right shoulder: Secondary | ICD-10-CM | POA: Diagnosis not present

## 2022-04-20 NOTE — Progress Notes (Signed)
? ?Office Visit Note ? ?Patient: [name]             ?Date of Birth: [birth year]           ?MRN: [id number]             ?PCP: Binnie Rail, MD ?Referring: Binnie Rail, MD ?Visit Date: 05/04/2022 ?Occupation: '@GUAROCC'$ @ ? ?Subjective:  ?Low back pain  ? ?History of Present Illness: [name] is a 76 y.o. female with history of autoimmune disease and osteoarthritis.  She is taking hydroxychloroquine 200 mg 1 tablet by mouth twice daily Monday to Friday.  She continues to tolerate Plaquenil without any side effects and has not missed any doses recently.  She denies any signs or symptoms of a flare.  She states that her left knee joint pain has improved.  She continues to have some swelling in the left knee which is exacerbated by activity.  She states that she recently had a right shoulder injection performed by Dr. French Ana which has improved her range of motion.  She continues to have chronic lower back pain despite having 2 epidural injections performed by Dr. Ron Agee.  She had an MRI of the lumbar spine in March 2023 which was reviewed.  Her activity level has gradually improved since having the epidural injections. ?She continues to experience intermittent pain and stiffness in both hands due to underlying osteoarthritis.  She denies any other increased joint pain or joint swelling at this time.  She denies any other signs or symptoms of not immune disease flare.  She has not had any recent rashes or photosensitivity.  She denies any shortness of breath, pleuritic chest pain, or palpitations.  She denies any Raynaud's phenomenon.  She denies any oral or nasal ulcerations.  She continues to have chronic sicca symptoms. ?Patient would like to hold off on treatment for osteoporosis at this time.  She has not had any falls or fractures recently. ? ? ? ? ?Activities of Daily Living:  ?Patient reports morning stiffness for 1 hour.   ?Patient Reports nocturnal pain.  ?Difficulty dressing/grooming:  Denies ?Difficulty climbing stairs: Denies ?Difficulty getting out of chair: Denies ?Difficulty using hands for taps, buttons, cutlery, and/or writing: Reports ? ?Review of Systems  ?Constitutional:  Positive for fatigue.  ?HENT:  Positive for mouth dryness.   ?Eyes:  Positive for dryness.  ?Respiratory:  Negative for shortness of breath.   ?Cardiovascular:  Negative for swelling in legs/feet.  ?Gastrointestinal:  Negative for constipation.  ?Endocrine: Negative for excessive thirst.  ?Genitourinary:  Negative for difficulty urinating.  ?Musculoskeletal:  Positive for joint pain, joint pain, joint swelling, muscle weakness, morning stiffness and muscle tenderness.  ?Skin:  Negative for rash.  ?Allergic/Immunologic: Negative for susceptible to infections.  ?Neurological:  Negative for numbness.  ?Hematological:  Negative for bruising/bleeding tendency.  ?Psychiatric/Behavioral:  Negative for sleep disturbance.   ? ?PMFS History:  ?Patient Active Problem List  ? Diagnosis Date Noted  ? Renal lesion 04/14/2022  ? Coccyx pain 05/19/2021  ? B12 deficiency 04/15/2021  ? COVID-19 04/14/2021  ? Autoimmune disorder ([place]) 04/13/2021  ? At risk for long QT syndrome 01/21/2021  ? Acute cystitis without hematuria 01/21/2021  ? Lumbar back pain with radiculopathy affecting left lower extremity 08/05/2020  ? Atypical chest pain 03/10/2020  ? Shingles 11/19/2019  ? Pain in both knees 07/09/2019  ? Right arm pain 02/26/2019  ? Acute pain of left knee 08/27/2018  ? Hand arthritis 06/13/2018  ?  Trigger middle finger of right hand 06/13/2018  ? 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 Intermittent palpitations 10/11/2017  ? Migraine 12/30/2015  ? Upper airway cough syndrome 10/29/2015  ? Osteoporosis 06/06/2011  ? Vitamin D deficiency 03/22/2010  ? BREAST CANCER, HX OF 03/22/2010  ? SPINAL STENOSIS, LUMBAR 03/25/2008  ? HYPERLIPIDEMIA 09/28/2007  ? Fibromyalgia 05/24/2007  ?  ?Past Medical History:  ?Diagnosis Date  ? Arthritis   ? Breast  cancer ([place])   ? R lumpectomy ; radiation; no chemotherapy  ? Bronchitis   ? recurrent  ? Bulging lumbar disc   ? Cataract   ? multifocal lens implant  ? Chronic low back pain   ? bulging disc & spinal stenosis  ? Coccygeal fracture ([place]) 05/15/2021  ? COVID-19   ? Cystitis   ? Fibromyalgia   ? Hyperlipemia   ? Mitral valve prolapse   ? Osteoporosis   ? Personal history of radiation therapy   ? Pneumonia   ? as OP X 2; no Pneumovax  ? Vitamin D deficiency   ? 19 in  01/2009  ?  ?Family History  ?Problem Relation Age of Onset  ? Atrial fibrillation Mother   ?     S/P cardioversion  ? Ulcers Mother   ? Hypertension Mother   ? Dementia Mother   ? Heart attack Father 53  ?     died @ 83  ? Hypertension Brother   ? Diabetes Brother   ? Atrial fibrillation Brother   ? Heart attack Paternal Aunt   ?     < 65  ? Heart attack Paternal Uncle   ?     > 55  ? Heart attack Paternal Grandmother   ?     < 65  ? Heart attack Paternal Grandfather   ?     > 55  ? Thyroid disease Daughter   ?     on Synthroid since high school  ? Healthy Son   ? Stroke Neg Hx   ? Breast cancer Neg Hx   ? ?Past Surgical History:  ?Procedure Laterality Date  ? ABDOMINAL HYSTERECTOMY  1990  ? USO for fibroid , Dr Maryruth Eve  ? BREAST LUMPECTOMY Right   ? BREAST SURGERY  2010  ? R side lumpectomy; Dr Margot Chimes  ? CATARACT EXTRACTION, BILATERAL  2017  ? CESAREAN SECTION    ?  G 2 P 2  ? CYSTECTOMY  2010  ? EYE SURGERY    ? eye lid surgery  ? FOOT SURGERY Right   ? no colonoscopy    ? "I just don't want to do it". SOC reviewed  ? ?Social History  ? ?Social History Narrative  ? Not on file  ? ?Immunization History  ?Administered Date(s) Administered  ? Fluad Quad(high Dose 65+) 10/02/2019  ? PFIZER(Purple Top)SARS-COV-2 Vaccination 01/08/2020, 01/27/2020, 10/17/2020  ? Td 12/20/1999  ?  ? ?Objective: ?Vital Signs: BP 128/73 (BP Location: Left Arm, Patient Position: Sitting, Cuff Size: Normal)   Pulse 69   Resp 15   Ht 5' 4.5" (1.638 m)   BMI 21.97 kg/m?    ? ?Physical Exam ?Vitals and nursing note reviewed.  ?Constitutional:   ?   Appearance: She is well-developed.  ?HENT:  ?   Head: Normocephalic and atraumatic.  ?Eyes:  ?   Conjunctiva/sclera: Conjunctivae normal.  ?Cardiovascular:  ?   Rate and Rhythm: Normal rate and regular rhythm.  ?   Heart sounds: Normal heart sounds.  ?  Pulmonary:  ?   Effort: Pulmonary effort is normal.  ?   Breath sounds: Normal breath sounds.  ?Abdominal:  ?   General: Bowel sounds are normal.  ?   Palpations: Abdomen is soft.  ?Musculoskeletal:  ?   Cervical back: Normal range of motion.  ?Skin: ?   General: Skin is warm and dry.  ?   Capillary Refill: Capillary refill takes less than 2 seconds.  ?Neurological:  ?   Mental Status: She is alert and oriented to person, place, and time.  ?Psychiatric:     ?   Behavior: Behavior normal.  ?  ? ?Musculoskeletal Exam: C-spine has limited range of motion.  Painful range of motion of the lumbar spine.  Shoulder joints have good range of motion with some tenderness to palpation over the right shoulder especially anteriorly.  Elbow joints, wrist joints, MCPs, PIPs, DIPs have good range of motion with no synovitis.  She has PIP and DIP thickening consistent with osteoarthritis of both hands.  Synovial thickening of the right second MCP joint noted.  Complete fist formation bilaterally.  CMC joint prominence noted bilaterally.  Right hip has slightly limited range of motion with some discomfort.  Left hip has good range of motion with no discomfort.  Some tenderness palpation over bilateral trochanteric bursa.  Right knee has good range of motion with no warmth or effusion.  Left knee joint has a very small effusion but no warmth was noted.  Ankle joints have good range of motion with no tenderness or joint swelling. ? ?CDAI Exam: ?CDAI Score: -- ?Patient Global: --; Provider Global: -- ?Swollen: --; Tender: -- ?Joint Exam 05/04/2022  ? ?No joint exam has been documented for this visit  ? ?There is  currently no information documented on the homunculus. Go to the Rheumatology activity and complete the homunculus joint exam. ? ?Investigation: ?No additional findings. ? ?Imaging: ?No results found. ? ?Recent Labs:

## 2022-04-25 DIAGNOSIS — M6281 Muscle weakness (generalized): Secondary | ICD-10-CM | POA: Diagnosis not present

## 2022-04-25 DIAGNOSIS — M7551 Bursitis of right shoulder: Secondary | ICD-10-CM | POA: Diagnosis not present

## 2022-04-25 DIAGNOSIS — M25611 Stiffness of right shoulder, not elsewhere classified: Secondary | ICD-10-CM | POA: Diagnosis not present

## 2022-05-04 ENCOUNTER — Encounter: Payer: Self-pay | Admitting: Physician Assistant

## 2022-05-04 ENCOUNTER — Ambulatory Visit (INDEPENDENT_AMBULATORY_CARE_PROVIDER_SITE_OTHER): Payer: BC Managed Care – PPO | Admitting: Physician Assistant

## 2022-05-04 VITALS — BP 128/73 | HR 69 | Resp 15 | Ht 64.5 in

## 2022-05-04 DIAGNOSIS — Z79899 Other long term (current) drug therapy: Secondary | ICD-10-CM

## 2022-05-04 DIAGNOSIS — M19041 Primary osteoarthritis, right hand: Secondary | ICD-10-CM

## 2022-05-04 DIAGNOSIS — G8929 Other chronic pain: Secondary | ICD-10-CM

## 2022-05-04 DIAGNOSIS — Z853 Personal history of malignant neoplasm of breast: Secondary | ICD-10-CM

## 2022-05-04 DIAGNOSIS — M797 Fibromyalgia: Secondary | ICD-10-CM

## 2022-05-04 DIAGNOSIS — M359 Systemic involvement of connective tissue, unspecified: Secondary | ICD-10-CM

## 2022-05-04 DIAGNOSIS — M65331 Trigger finger, right middle finger: Secondary | ICD-10-CM

## 2022-05-04 DIAGNOSIS — M1611 Unilateral primary osteoarthritis, right hip: Secondary | ICD-10-CM

## 2022-05-04 DIAGNOSIS — Z8669 Personal history of other diseases of the nervous system and sense organs: Secondary | ICD-10-CM

## 2022-05-04 DIAGNOSIS — M810 Age-related osteoporosis without current pathological fracture: Secondary | ICD-10-CM

## 2022-05-04 DIAGNOSIS — S322XXD Fracture of coccyx, subsequent encounter for fracture with routine healing: Secondary | ICD-10-CM

## 2022-05-04 DIAGNOSIS — E559 Vitamin D deficiency, unspecified: Secondary | ICD-10-CM

## 2022-05-04 DIAGNOSIS — M19072 Primary osteoarthritis, left ankle and foot: Secondary | ICD-10-CM

## 2022-05-04 DIAGNOSIS — M19071 Primary osteoarthritis, right ankle and foot: Secondary | ICD-10-CM

## 2022-05-04 DIAGNOSIS — E538 Deficiency of other specified B group vitamins: Secondary | ICD-10-CM

## 2022-05-04 DIAGNOSIS — M5136 Other intervertebral disc degeneration, lumbar region: Secondary | ICD-10-CM

## 2022-05-04 DIAGNOSIS — R5383 Other fatigue: Secondary | ICD-10-CM

## 2022-05-04 DIAGNOSIS — M25511 Pain in right shoulder: Secondary | ICD-10-CM

## 2022-05-04 DIAGNOSIS — Z8619 Personal history of other infectious and parasitic diseases: Secondary | ICD-10-CM

## 2022-05-04 DIAGNOSIS — M19042 Primary osteoarthritis, left hand: Secondary | ICD-10-CM

## 2022-05-04 DIAGNOSIS — M25462 Effusion, left knee: Secondary | ICD-10-CM

## 2022-05-04 DIAGNOSIS — R002 Palpitations: Secondary | ICD-10-CM

## 2022-05-05 LAB — ANTI-DNA ANTIBODY, DOUBLE-STRANDED: ds DNA Ab: <1 IU/mL

## 2022-05-05 LAB — URINALYSIS, ROUTINE W REFLEX MICROSCOPIC
Bilirubin Urine: NEGATIVE
Glucose, UA: NEGATIVE
Hgb urine dipstick: NEGATIVE
Ketones, ur: NEGATIVE
Leukocytes,Ua: NEGATIVE
Nitrite: NEGATIVE
Protein, ur: NEGATIVE
Specific Gravity, Urine: 1.019 (ref 1.001–1.035)
pH: <=5 (ref 5.0–8.0)

## 2022-05-05 LAB — C3 AND C4
C3 Complement: 148 mg/dL (ref 83–193)
C4 Complement: 42 mg/dL (ref 15–57)

## 2022-05-05 LAB — SEDIMENTATION RATE: Sed Rate: 11 mm/h (ref 0–30)

## 2022-05-05 NOTE — Progress Notes (Signed)
ESR and complements WNL.  UA normal.

## 2022-05-09 ENCOUNTER — Ambulatory Visit: Payer: BC Managed Care – PPO | Admitting: Physician Assistant

## 2022-05-19 DIAGNOSIS — Z79899 Other long term (current) drug therapy: Secondary | ICD-10-CM | POA: Diagnosis not present

## 2022-06-17 ENCOUNTER — Other Ambulatory Visit: Payer: Self-pay | Admitting: *Deleted

## 2022-06-17 MED ORDER — HYDROXYCHLOROQUINE SULFATE 200 MG PO TABS: ORAL_TABLET | ORAL | 0 refills | Status: DC

## 2022-06-17 NOTE — Telephone Encounter (Signed)
Refill request received via fax  Next Visit: 10/11/2022  Last Visit: 05/04/2022  Labs: 04/11/2022 GFR 52.71  Eye exam: 12/30/2021 WNL    Current Dose per office note 05/04/2022:  Hydroxychloroquine 200 mg 1 tablet by mouth twice daily Monday to Friday  RA:[id number] disease   Last Fill: 12/29/2021  Okay to refill Plaquenil?

## 2022-06-27 NOTE — Progress Notes (Unsigned)
Office Visit Note  Patient: [name]             Date of Birth: 11/22/1946           MRN: [id number]             PCP: Binnie Rail, MD Referring: Binnie Rail, MD Visit Date: 06/28/2022 Occupation: '@GUAROCC'$ @  Subjective:  Other (Right great toe pain, patient denies injury. Onset of pain was 2 weeks ago. )   History of Present Illness: [name] is a 76 y.o. female with history of autoimmune disease, osteoarthritis, degenerative disc disease.  She states for the last 2 weeks she has been having difficulty walking due to pain and discomfort in her right great toe.  She is concerned that she may have COVID.  She has not noticed any redness or swelling.  She states her toe always and states swollen and causes discomfort after walking for some time.  She continues to have dry mouth and dry eyes.  She denies any history of Raynaud's phenomenon, hair loss, photosensitivity or lymphadenopathy.  Activities of Daily Living:  Patient reports morning stiffness for 1 hour.   Patient Denies nocturnal pain.  Difficulty dressing/grooming: Denies Difficulty climbing stairs: Denies Difficulty getting out of chair: Denies Difficulty using hands for taps, buttons, cutlery, and/or writing: Reports  Review of Systems  Constitutional:  Negative for fatigue.  HENT:  Positive for mouth dryness. Negative for mouth sores.   Eyes:  Positive for dryness.  Respiratory:  Negative for shortness of breath.   Cardiovascular:  Positive for chest pain. Negative for palpitations.  Gastrointestinal:  Negative for blood in stool, constipation and diarrhea.  Endocrine: Negative for increased urination.  Genitourinary:  Negative for involuntary urination.  Musculoskeletal:  Positive for joint pain, joint pain, joint swelling, myalgias, muscle weakness, morning stiffness, muscle tenderness and myalgias.  Skin:  Negative for color change, rash, hair loss and sensitivity to sunlight.   Allergic/Immunologic: Negative for susceptible to infections.  Neurological:  Negative for dizziness and headaches.  Hematological:  Negative for swollen glands.  Psychiatric/Behavioral:  Negative for depressed mood and sleep disturbance. The patient is not nervous/anxious.     PMFS History:  Patient Active Problem List   Diagnosis Date Noted   Renal lesion 04/14/2022   Coccyx pain 05/19/2021   B12 deficiency 04/15/2021   COVID-19 04/14/2021   Autoimmune disorder ([place]) 04/13/2021   At risk for long QT syndrome 01/21/2021   Acute cystitis without hematuria 01/21/2021   Lumbar back pain with radiculopathy affecting left lower extremity 08/05/2020   Atypical chest pain 03/10/2020   Shingles 11/19/2019   Pain in both knees 07/09/2019   Right arm pain 02/26/2019   Acute pain of left knee 08/27/2018   Hand arthritis 06/13/2018   Trigger middle finger of right hand 06/13/2018   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   Intermittent palpitations 10/11/2017   Migraine 12/30/2015   Upper airway cough syndrome 10/29/2015   Osteoporosis 06/06/2011   Vitamin D deficiency 03/22/2010   BREAST CANCER, HX OF 03/22/2010   SPINAL STENOSIS, LUMBAR 03/25/2008   HYPERLIPIDEMIA 09/28/2007   Fibromyalgia 05/24/2007    Past Medical History:  Diagnosis Date   Arthritis    Breast cancer ([place])    R lumpectomy ; radiation; no chemotherapy   Bronchitis    recurrent   Bulging lumbar disc    Cataract    multifocal lens implant   Chronic low back pain    bulging  disc & spinal stenosis   Coccygeal fracture ([facility]) 05/15/2021   COVID-19    Cystitis    Fibromyalgia    Hyperlipemia    Mitral valve prolapse    Osteoporosis    Personal history of radiation therapy    Pneumonia    as OP X 2; no Pneumovax   Vitamin D deficiency    19 in  01/2009    Family History  Problem Relation Age of Onset   Atrial fibrillation Mother        S/P cardioversion   Ulcers Mother    Hypertension Mother    Dementia  Mother    Heart attack Father 50       died @ 70   Hypertension Brother    Diabetes Brother    Atrial fibrillation Brother    Heart attack Paternal Aunt        < 44   Heart attack Paternal Uncle        > 11   Heart attack Paternal Grandmother        < 64   Heart attack Paternal Grandfather        > 109   Thyroid disease Daughter        on Synthroid since high school   Healthy Son    Stroke Neg Hx    Breast cancer Neg Hx    Past Surgical History:  Procedure Laterality Date   ABDOMINAL HYSTERECTOMY  1990   USO for fibroid , Dr Maryruth Eve   BREAST LUMPECTOMY Right    BREAST SURGERY  2010   R side lumpectomy; Dr Margot Chimes   CATARACT EXTRACTION, BILATERAL  2017   CESAREAN SECTION      G 2 P 2   CYSTECTOMY  2010   EYE SURGERY     eye lid surgery   FOOT SURGERY Right    no colonoscopy     "I just don't want to do it". SOC reviewed   Social History   Social History Narrative   Not on file   Immunization History  Administered Date(s) Administered   Fluad Quad(high Dose 65+) 10/02/2019   PFIZER(Purple Top)SARS-COV-2 Vaccination 01/08/2020, 01/27/2020, 10/17/2020   Td 12/20/1999     Objective: Vital Signs: BP (!) 164/97 (BP Location: Left Arm, Patient Position: Sitting, Cuff Size: Normal)   Pulse 71   Ht 5' 4.5" (1.638 m)   Wt 130 lb (59 kg) Comment: per patient  BMI 21.97 kg/m    Physical Exam Vitals and nursing note reviewed.  Constitutional:      Appearance: She is well-developed.  HENT:     Head: Normocephalic and atraumatic.  Eyes:     Conjunctiva/sclera: Conjunctivae normal.  Cardiovascular:     Rate and Rhythm: Normal rate and regular rhythm.     Heart sounds: Normal heart sounds.  Pulmonary:     Effort: Pulmonary effort is normal.     Breath sounds: Normal breath sounds.  Abdominal:     General: Bowel sounds are normal.     Palpations: Abdomen is soft.  Musculoskeletal:     Cervical back: Normal range of motion.  Lymphadenopathy:     Cervical: No  cervical adenopathy.  Skin:    General: Skin is warm and dry.     Capillary Refill: Capillary refill takes less than 2 seconds.  Neurological:     Mental Status: She is alert and oriented to person, place, and time.  Psychiatric:        Behavior: Behavior  normal.      Musculoskeletal Exam: C-spine was in good range of motion.  Shoulder joints, elbow joints, wrist joints, MCPs PIPs and DIPs with good range of motion.  Bilateral PIP and DIP thickening with no synovitis was noted.  She has good range of motion of her hip joints without discomfort.  Knee joints in good range of motion without any warmth swelling or effusion.  She had no tenderness over ankles.  She has thickening of the right first MTP joint without any warmth swelling or redness.  She had limited range of motion of the right first MTP joint compared to the left first MTP joint.  CDAI Exam: CDAI Score: -- Patient Global: --; Provider Global: -- Swollen: --; Tender: -- Joint Exam 06/28/2022   No joint exam has been documented for this visit   There is currently no information documented on the homunculus. Go to the Rheumatology activity and complete the homunculus joint exam.  Investigation: No additional findings.  Imaging: No results found.  Recent Labs: Lab Results  Component Value Date   WBC 5.6 04/11/2022   HGB 12.8 04/11/2022   PLT 235.0 04/11/2022   NA 142 04/11/2022   K 4.3 04/11/2022   CL 107 04/11/2022   CO2 26 04/11/2022   GLUCOSE 97 04/11/2022   BUN 16 04/11/2022   CREATININE 1.04 04/11/2022   BILITOT 0.5 04/11/2022   ALKPHOS 61 04/11/2022   AST 16 04/11/2022   ALT 13 04/11/2022   PROT 6.9 04/11/2022   ALBUMIN 4.4 04/11/2022   CALCIUM 9.3 04/11/2022   GFRAA 58 (L) 05/24/2021   QFTBGOLDPLUS NEGATIVE 10/27/2021    Speciality Comments: PLQ Eye Exam:12/30/2021 WNL @ [facility] Follow up in 6 months.  Procedures:  No procedures performed Allergies: Morphine, Albuterol, Augmentin  [amoxicillin-pot clavulanate], Ciprofloxacin, Fish allergy, Gabapentin, Risedronate sodium, Cyclobenzaprine, and Ibandronate sodium   Assessment / Plan:     Visit Diagnoses: Autoimmune disease ([place]) - ANA 1: 640NS, history of dry eyes, hair loss, Raynaud's, inflammatory arthritis: -She continues to have sicca symptoms.  She denies any hair loss or Raynaud's phenomenon.  She had no synovitis on my examination.  She denies history of oral ulcers, nasal ulcers or malar rash.  Her autoimmune labs have been stable.  We will check labs with her next blood work in September.  Labs obtained in November and in May were within normal limits.  Plan: Protein / creatinine ratio, urine, Anti-DNA antibody, double-stranded, C3 and C4, Sedimentation rate.  We may consider tapering Plaquenil if her labs continue to be stable.  High risk medication use - Hydroxychloroquine 200 mg 1 tablet by mouth twice daily Monday to Friday. PLQ Eye Exam:12/30/2021  - Plan: CBC with Differential/Platelet, COMPLETE METABOLIC PANEL WITH GFR in September.  Chronic right shoulder pain-she had good range of motion of bilateral shoulders without discomfort today.  Primary osteoarthritis of both hands-she had bilateral PIP and DIP thickening.  No synovitis was noted.  Trigger middle finger of right hand -she has intermittent triggering.  Use of topical anti-inflammatory agents were discussed.  Primary osteoarthritis of right hip-she had good range of motion without discomfort.  Effusion, left knee -she had no warmth swelling or effusion today.  MRI of left knee 10/15/21: Intact ligamentous structures and no acute bony findings.  No meniscal tears.  Tricompartmental degenerative changes most significant.  Primary osteoarthritis of both feet-she has known osteoarthritis in her bilateral feet.  Pain in right foot - Plan: XR Foot 2 Views  Right.  She complains of increased pain and discomfort in her right great toe.  X-rays obtained of the  right foot were reviewed with the patient.  X-rays were consistent with osteoarthritis.  She had postsurgical change in the right fifth MTP joint.  She had first MTP narrowing and spurring.  The symptoms were not typical for gout.  Patient denies any history of redness or inflammation in her joint.  She continues to have discomfort walking.  Proper fitting shoes were discussed.  She may see a podiatrist if she has persistent symptoms.  DDD (degenerative disc disease), lumbar-she has chronic discomfort.  Closed fracture of coccyx with routine healing, subsequent encounter  Age-related osteoporosis without current pathological fracture - 04/23/21 DEXA scan showed left femoral neck T score -2.7.  Intolerance to Fosamax and Boniva in the past.  She does not want to take any treatment at this point.  Vitamin D deficiency-vitamin D was normal on April 11, 2022.  Fibromyalgia -she continues to have some discomfort from fibromyalgia.  She is on tramadol 50 mg 1 tablet every 12 hours as needed for pain relief.  She remains on trazodone 150 mg at bedtime for insomnia.  Other fatigue-most likely related to fibromyalgia.  Primary hypertension-patient is on Norvasc 5 mg p.o. daily.  Blood pressure today was 168/90.  Blood pressure was taken 3 times in the office today.  Patient stated that she gets episodes of chest pain with the high blood pressure.  Advised her to contact her PCP.  I also advised her to go to the emergency room if she develops chest pain.  Patient states that she will contact her PCP.  Other medical problems are listed as follows:  Intermittent palpitations  History of breast cancer  Hx of migraines  History of shingles  Vitamin B12 deficiency    Orders: Orders Placed This Encounter  Procedures   XR Foot 2 Views Right   Protein / creatinine ratio, urine   CBC with Differential/Platelet   COMPLETE METABOLIC PANEL WITH GFR   Anti-DNA antibody, double-stranded   C3 and C4    Sedimentation rate   No orders of the defined types were placed in this encounter.   Follow-Up Instructions: Return in about 5 months (around 11/28/2022) for Autoimmune disease.   Bo Merino, MD  Note - This record has been created using Editor, commissioning.  Chart creation errors have been sought, but may not always  have been located. Such creation errors do not reflect on  the standard of medical care.

## 2022-06-28 ENCOUNTER — Encounter: Payer: Self-pay | Admitting: Rheumatology

## 2022-06-28 ENCOUNTER — Ambulatory Visit (INDEPENDENT_AMBULATORY_CARE_PROVIDER_SITE_OTHER): Payer: BC Managed Care – PPO

## 2022-06-28 ENCOUNTER — Ambulatory Visit (INDEPENDENT_AMBULATORY_CARE_PROVIDER_SITE_OTHER): Payer: BC Managed Care – PPO | Admitting: Rheumatology

## 2022-06-28 ENCOUNTER — Encounter: Payer: Self-pay | Admitting: Internal Medicine

## 2022-06-28 VITALS — BP 168/90 | HR 62 | Ht 64.5 in | Wt 130.0 lb

## 2022-06-28 DIAGNOSIS — I10 Essential (primary) hypertension: Secondary | ICD-10-CM

## 2022-06-28 DIAGNOSIS — M1611 Unilateral primary osteoarthritis, right hip: Secondary | ICD-10-CM

## 2022-06-28 DIAGNOSIS — Z853 Personal history of malignant neoplasm of breast: Secondary | ICD-10-CM

## 2022-06-28 DIAGNOSIS — M25511 Pain in right shoulder: Secondary | ICD-10-CM

## 2022-06-28 DIAGNOSIS — M5136 Other intervertebral disc degeneration, lumbar region: Secondary | ICD-10-CM

## 2022-06-28 DIAGNOSIS — M25462 Effusion, left knee: Secondary | ICD-10-CM

## 2022-06-28 DIAGNOSIS — Z8669 Personal history of other diseases of the nervous system and sense organs: Secondary | ICD-10-CM

## 2022-06-28 DIAGNOSIS — R5383 Other fatigue: Secondary | ICD-10-CM

## 2022-06-28 DIAGNOSIS — S322XXD Fracture of coccyx, subsequent encounter for fracture with routine healing: Secondary | ICD-10-CM

## 2022-06-28 DIAGNOSIS — M19041 Primary osteoarthritis, right hand: Secondary | ICD-10-CM | POA: Diagnosis not present

## 2022-06-28 DIAGNOSIS — M810 Age-related osteoporosis without current pathological fracture: Secondary | ICD-10-CM

## 2022-06-28 DIAGNOSIS — M65331 Trigger finger, right middle finger: Secondary | ICD-10-CM

## 2022-06-28 DIAGNOSIS — Z79899 Other long term (current) drug therapy: Secondary | ICD-10-CM | POA: Diagnosis not present

## 2022-06-28 DIAGNOSIS — M79671 Pain in right foot: Secondary | ICD-10-CM

## 2022-06-28 DIAGNOSIS — M359 Systemic involvement of connective tissue, unspecified: Secondary | ICD-10-CM

## 2022-06-28 DIAGNOSIS — Z8619 Personal history of other infectious and parasitic diseases: Secondary | ICD-10-CM

## 2022-06-28 DIAGNOSIS — E559 Vitamin D deficiency, unspecified: Secondary | ICD-10-CM

## 2022-06-28 DIAGNOSIS — M51369 Other intervertebral disc degeneration, lumbar region without mention of lumbar back pain or lower extremity pain: Secondary | ICD-10-CM

## 2022-06-28 DIAGNOSIS — R002 Palpitations: Secondary | ICD-10-CM

## 2022-06-28 DIAGNOSIS — M19071 Primary osteoarthritis, right ankle and foot: Secondary | ICD-10-CM

## 2022-06-28 DIAGNOSIS — E538 Deficiency of other specified B group vitamins: Secondary | ICD-10-CM

## 2022-06-28 DIAGNOSIS — M19072 Primary osteoarthritis, left ankle and foot: Secondary | ICD-10-CM

## 2022-06-28 DIAGNOSIS — M797 Fibromyalgia: Secondary | ICD-10-CM

## 2022-06-28 DIAGNOSIS — M19042 Primary osteoarthritis, left hand: Secondary | ICD-10-CM

## 2022-06-28 DIAGNOSIS — G8929 Other chronic pain: Secondary | ICD-10-CM

## 2022-06-28 NOTE — Patient Instructions (Addendum)
Blood pressure readings today:  158/103 164/97 168/90    Standing Labs We placed an order today for your standing lab work.   Please have your standing labs drawn in October and every 5 months  If possible, please have your labs drawn 2 weeks prior to your appointment so that the provider can discuss your results at your appointment.  Please note that you may see your imaging and lab results in [facility] before we have reviewed them. We may be awaiting multiple results to interpret others before contacting you. Please allow our office up to 72 hours to thoroughly review all of the results before contacting the office for clarification of your results.  We have open lab daily: Monday through Thursday from 1:30-4:30 PM and Friday from 1:30-4:00 PM at the office of Dr. Bo Merino, [facility].   Please be advised, all patients with office appointments requiring lab work will take precedent over walk-in lab work.  If possible, please come for your lab work on Monday and Friday afternoons, as you may experience shorter wait times. The office is located at [address], [place], [place], [place] [address] No appointment is necessary.   Labs are drawn by [facility]. Please bring your co-pay at the time of your lab draw.  You may receive a bill from [facility] for your lab work.  Please note if you are on Hydroxychloroquine and and an order has been placed for a Hydroxychloroquine level, you will need to have it drawn 4 hours or more after your last dose.  If you wish to have your labs drawn at another location, please call the office 24 hours in advance to send orders.  If you have any questions regarding directions or hours of operation,  please call [phone].   As a reminder, please drink plenty of water prior to coming for your lab work. Thanks!   Vaccines You are taking a medication(s) that can suppress your immune system.  The following immunizations are  recommended: Flu annually Covid-19  Td/Tdap (tetanus, diphtheria, pertussis) every 10 years Pneumonia (Prevnar 15 then Pneumovax 23 at least 1 year apart.  Alternatively, can take Prevnar 20 without needing additional dose) Shingrix: 2 doses from 4 weeks to 6 months apart  Please check with your PCP to make sure you are up to date.

## 2022-07-04 NOTE — Progress Notes (Unsigned)
    Subjective:    Patient ID: [name], female    DOB: 25-Aug-1946, 76 y.o.   MRN: [id number]      HPI [name] is here for No chief complaint on file.    Elevated BP -     Medications and allergies reviewed with patient and updated if appropriate.  Current Outpatient Medications on File Prior to Visit  Medication Sig Dispense Refill   acetaminophen (TYLENOL) 500 MG tablet Take 1,000 mg by mouth daily as needed for mild pain.     amLODipine (NORVASC) 5 MG tablet TAKE 1 TABLET(5 MG) BY MOUTH DAILY 90 tablet 3   CALCIUM PO Take by mouth daily.     cholecalciferol (VITAMIN D3) 25 MCG (1000 UNIT) tablet Take 1,000 Units by mouth daily.     CRANBERRY PO Take by mouth.     Cyanocobalamin (B-12 PO) Take by mouth.     Cyanocobalamin (VITAMIN B-12 PO) Take by mouth. (Patient not taking: Reported on 06/28/2022)     eletriptan (RELPAX) 20 MG tablet TAKE 1 TABLET BY MOUTH EVERY 2 HOURS AS NEEDED FOR HEADACHE OR MIGRAINE. MAY REPEAT IN 2 HOURS IF HEADACHE PERSISTS OR RECURS. 9 tablet 1   hydroxychloroquine (PLAQUENIL) 200 MG tablet TAKE 1 TABLET BY MOUTH TWICE DAILY MONDAY THROUGH FRIDAY 120 tablet 0   Probiotic Product (PROBIOTIC DAILY PO) Take by mouth.     traMADol (ULTRAM) 50 MG tablet Take 1 tablet (50 mg total) by mouth every 12 (twelve) hours as needed. 60 tablet 0   traZODone (DESYREL) 50 MG tablet TAKE 3 TABLETS(150 MG) BY MOUTH AT BEDTIME 270 tablet 1   No current facility-administered medications on file prior to visit.    Review of Systems     Objective:  There were no vitals filed for this visit. BP Readings from Last 3 Encounters:  06/28/22 (!) 168/90  05/04/22 128/73  04/15/22 122/78   Wt Readings from Last 3 Encounters:  06/28/22 130 lb (59 kg)  12/06/21 130 lb (59 kg)  10/27/21 128 lb (58.1 kg)   There is no height or weight on file to calculate BMI.    Physical Exam         Assessment & Plan:    See Problem List for Assessment and Plan of  chronic medical problems.

## 2022-07-05 ENCOUNTER — Ambulatory Visit (INDEPENDENT_AMBULATORY_CARE_PROVIDER_SITE_OTHER): Payer: BC Managed Care – PPO | Admitting: Internal Medicine

## 2022-07-05 ENCOUNTER — Encounter: Payer: Self-pay | Admitting: Internal Medicine

## 2022-07-05 VITALS — BP 130/84 | HR 67 | Temp 98.5°F | Ht 64.5 in

## 2022-07-05 DIAGNOSIS — R0789 Other chest pain: Secondary | ICD-10-CM

## 2022-07-05 DIAGNOSIS — I10 Essential (primary) hypertension: Secondary | ICD-10-CM | POA: Diagnosis not present

## 2022-07-05 MED ORDER — VALSARTAN 40 MG PO TABS: 40.0000 mg | ORAL_TABLET | Freq: Every day | ORAL | 3 refills | Status: DC

## 2022-07-05 NOTE — Assessment & Plan Note (Signed)
New Having substernal chest pain radiating up to right jaw/ear-has had several episodes, most recent was last week Chest pain a couple of years ago-did go to the ED at that time and work-up was negative EKG today: NSR 63 bpm, possible LAE, otherwise normal EKG-there is no change compared to the previous EKG from 01/2021 CT coronary calcium score Consider cardiology referral Follow-up in 3 weeks

## 2022-07-05 NOTE — Patient Instructions (Addendum)
    An EKG was done today.     Medications changes include :   stop amlodipine and start valsartan 40 mg daily   Your prescription(s) have been sent to your pharmacy.    A Ct scan of your heart arteries was ordered.     Return in about 3 weeks (around 07/26/2022) for hypertension f/u.

## 2022-07-05 NOTE — Assessment & Plan Note (Signed)
Chronic Recently blood pressure has been variable-she does not check regularly at home-only checks when she does not feel well SBP ranging from 108-168 Do not want to increase the amlodipine Concerned that may cause more lightheadedness with low BPs We will try discontinuing amlodipine and changing her to valsartan to see if this helps stabilize her blood pressure Start valsartan 40 mg daily Advised her to monitor blood pressure at home regularly Follow-up in 3 weeks

## 2022-07-07 NOTE — Addendum Note (Signed)
Addended by: Binnie Rail on: 07/07/2022 02:14 PM   Modules accepted: Orders

## 2022-07-08 ENCOUNTER — Other Ambulatory Visit: Payer: Self-pay | Admitting: Internal Medicine

## 2022-07-08 ENCOUNTER — Ambulatory Visit (HOSPITAL_BASED_OUTPATIENT_CLINIC_OR_DEPARTMENT_OTHER)
Admission: RE | Admit: 2022-07-08 | Discharge: 2022-07-08 | Disposition: A | Discharge status: Home / Self Care | Payer: BC Managed Care – PPO | Source: Ambulatory Visit | Attending: Internal Medicine | Admitting: Internal Medicine

## 2022-07-08 DIAGNOSIS — R0789 Other chest pain: Secondary | ICD-10-CM | POA: Insufficient documentation

## 2022-07-11 ENCOUNTER — Encounter: Payer: Self-pay | Admitting: Internal Medicine

## 2022-07-26 ENCOUNTER — Ambulatory Visit: Payer: BC Managed Care – PPO | Admitting: Internal Medicine

## 2022-07-28 DIAGNOSIS — R931 Abnormal findings on diagnostic imaging of heart and coronary circulation: Secondary | ICD-10-CM | POA: Insufficient documentation

## 2022-07-28 NOTE — Progress Notes (Signed)
Subjective:    Patient ID: [name], female    DOB: December 27, 1945, 76 y.o.   MRN: [id number]     HPI [name] is here for follow up of her chronic medical problems, including   Ct coronary calcium score - 0  Valsartan started for elevated BP,  was having CP - cancelled cardio appt since above score was 0  Bp at home - 115/77-165/96.  Blood pressure has continued to vary and some days it has been too low and other days too high.  There does not seem to be any pattern of it.  When the blood pressure is too low she does not feel well.  When blood pressure is too high she does not feel well.  She did bring her cuff with her today and it is higher than what we got here today   Medications and allergies reviewed with patient and updated if appropriate.  Current Outpatient Medications on File Prior to Visit  Medication Sig Dispense Refill   acetaminophen (TYLENOL) 500 MG tablet Take 1,000 mg by mouth daily as needed for mild pain.     CALCIUM PO Take by mouth daily.     cholecalciferol (VITAMIN D3) 25 MCG (1000 UNIT) tablet Take 1,000 Units by mouth daily.     CRANBERRY PO Take by mouth.     Cyanocobalamin (B-12 PO) Take by mouth.     eletriptan (RELPAX) 20 MG tablet TAKE 1 TABLET BY MOUTH EVERY 2 HOURS AS NEEDED FOR HEADACHE OR MIGRAINE. MAY REPEAT IN 2 HOURS IF HEADACHE PERSISTS OR RECURS. 9 tablet 1   hydroxychloroquine (PLAQUENIL) 200 MG tablet TAKE 1 TABLET BY MOUTH TWICE DAILY MONDAY THROUGH FRIDAY 120 tablet 0   Probiotic Product (PROBIOTIC DAILY PO) Take by mouth.     traMADol (ULTRAM) 50 MG tablet Take 1 tablet (50 mg total) by mouth every 12 (twelve) hours as needed. 60 tablet 0   traZODone (DESYREL) 50 MG tablet TAKE 3 TABLETS(150 MG) BY MOUTH AT BEDTIME 270 tablet 1   No current facility-administered medications on file prior to visit.     Review of Systems  Respiratory:  Negative for shortness of breath.   Cardiovascular:  Negative for chest pain, palpitations  and leg swelling.  Neurological:  Negative for light-headedness and headaches.       Objective:   Vitals:   07/29/22 1315  BP: 130/80  Pulse: 63  Temp: 98.6 F (37 C)  SpO2: 98%   BP Readings from Last 3 Encounters:  07/29/22 130/80  07/05/22 130/84  06/28/22 (!) 168/90   Wt Readings from Last 3 Encounters:  06/28/22 130 lb (59 kg)  12/06/21 130 lb (59 kg)  10/27/21 128 lb (58.1 kg)   Body mass index is 21.97 kg/m.    Physical Exam Constitutional:      General: She is not in acute distress.    Appearance: Normal appearance.  HENT:     Head: Normocephalic and atraumatic.  Eyes:     Conjunctiva/sclera: Conjunctivae normal.  Cardiovascular:     Rate and Rhythm: Normal rate and regular rhythm.     Heart sounds: Normal heart sounds. No murmur heard. Pulmonary:     Effort: Pulmonary effort is normal. No respiratory distress.     Breath sounds: Normal breath sounds. No wheezing.  Musculoskeletal:     Right lower leg: No edema.     Left lower leg: No edema.  Skin:    General: Skin is  warm and dry.     Findings: No rash.  Neurological:     Mental Status: She is alert.        Lab Results  Component Value Date   WBC 5.6 04/11/2022   HGB 12.8 04/11/2022   HCT 38.6 04/11/2022   PLT 235.0 04/11/2022   GLUCOSE 97 04/11/2022   CHOL 218 (H) 04/11/2022   TRIG 110.0 04/11/2022   HDL 67.70 04/11/2022   LDLDIRECT 122.6 06/03/2013   LDLCALC 128 (H) 04/11/2022   ALT 13 04/11/2022   AST 16 04/11/2022   NA 142 04/11/2022   K 4.3 04/11/2022   CL 107 04/11/2022   CREATININE 1.04 04/11/2022   BUN 16 04/11/2022   CO2 26 04/11/2022   TSH 3.67 04/11/2022   HGBA1C 5.2 03/06/2020  CT CARDIAC SCORING (SELF PAY ONLY) Addendum: ADDENDUM REPORT: 07/11/2022 11:43   ADDENDUM:  The following report is an over-read performed by radiologist Dr.  Dahlia Bailiff of [facility] on July 11, 2022. This  over-read does not include interpretation of cardiac or coronary   anatomy or pathology. The coronary calcium score interpretation by  the cardiologist is attached.   COMPARISON:  None.   FINDINGS:  Vascular: Aortic atherosclerosis. No acute non-cardiac vascular  finding.   Mediastinum/Nodes: In the visualized portions of the thorax no  pathologically enlarged mediastinal, or hilar lymph nodes, noting  limited sensitivity for the detection of hilar adenopathy on this  noncontrast study. Distal esophagus is grossly unremarkable.   Lungs/Pleura: Within the visualized portions of the thorax there are  no suspicious appearing pulmonary nodules or masses, there is no  acute consolidative airspace disease, no pleural effusions and no  pneumothorax   Upper Abdomen: Visualized portions of the upper abdomen are  unremarkable.   Musculoskeletal: Thoracic spondylosis.   IMPRESSION:  No significant incidental noncardiac finding noted.   Aortic Atherosclerosis (ICD10-I70.0).   Electronically Signed    By: Dahlia Bailiff M.D.    On: 07/11/2022 11:43 Narrative: CLINICAL DATA:  Cardiovascular Disease Risk stratification  EXAM: Coronary Calcium Score  TECHNIQUE: A gated, non-contrast computed tomography scan of the heart was performed using 48m slice thickness. Axial images were analyzed on a dedicated workstation. Calcium scoring of the coronary arteries was performed using the Agatston method.  FINDINGS: Coronary arteries: Normal origins.  Coronary Calcium Score:  Left main: 0  Left anterior descending artery: 0  Left circumflex artery: 0  Right coronary artery: 0  Total: 0  Percentile: 0  Pericardium: Normal.  Aorta: Normal caliber of ascending aorta. Aortic atherosclerosis noted.  [facility]card[facility]: [facility]  IMPRESSION: Coronary calcium score of 0. This was 0 percentile for age-, race-, and sex-matched controls. Aortic atherosclerosis.  RECOMMENDATIONS: Coronary artery calcium (CAC) score is  a strong predictor of incident coronary heart disease (CHD) and provides predictive information beyond traditional risk factors. CAC scoring is reasonable to use in the decision to withhold, postpone, or initiate statin therapy in intermediate-risk or selected borderline-risk asymptomatic adults (age 75-75years and LDL-C >=70 to <190 mg/dL) who do not have diabetes or established atherosclerotic cardiovascular disease (ASCVD).* In intermediate-risk (10-year ASCVD risk >=7.5% to <20%) adults or selected borderline-risk (10-year ASCVD risk >=5% to <7.5%) adults in whom a CAC score is measured for the purpose of making a treatment decision the following recommendations have been made:  If CAC=0, it is reasonable to withhold statin therapy and reassess in 5 to 10 years, as long as higher risk  conditions are absent (diabetes mellitus, family history of premature CHD in first degree relatives (males <55 years; females <65 years), cigarette smoking, or LDL >=190 mg/dL).  If CAC is 1 to 99, it is reasonable to initiate statin therapy for patients >=84 years of age.  If CAC is >=100 or >=75th percentile, it is reasonable to initiate statin therapy at any age.  Cardiology referral should be considered for patients with CAC scores >=400 or >=75th percentile.  *2018 AHA/ACC/AACVPR/AAPA/ABC/ACPM/ADA/AGS/APhA/ASPC/NLA/PCNA Guideline on the Management of Blood Cholesterol: A Report of the American College of Cardiology/American Heart Association Task Force on Clinical Practice Guidelines. J Am Coll Cardiol. 2019;73(24):3168-3209.  Buford Dresser, MD  Electronically Signed: By: Buford Dresser M.D. On: 07/10/2022 21:38     Assessment & Plan:    See Problem List for Assessment and Plan of chronic medical problems.

## 2022-07-29 ENCOUNTER — Ambulatory Visit (INDEPENDENT_AMBULATORY_CARE_PROVIDER_SITE_OTHER): Payer: BC Managed Care – PPO | Admitting: Internal Medicine

## 2022-07-29 ENCOUNTER — Encounter: Payer: Self-pay | Admitting: Internal Medicine

## 2022-07-29 VITALS — BP 130/80 | HR 63 | Temp 98.6°F | Ht 64.5 in

## 2022-07-29 DIAGNOSIS — R0789 Other chest pain: Secondary | ICD-10-CM | POA: Diagnosis not present

## 2022-07-29 DIAGNOSIS — I10 Essential (primary) hypertension: Secondary | ICD-10-CM | POA: Diagnosis not present

## 2022-07-29 MED ORDER — AMLODIPINE BESYLATE 2.5 MG PO TABS: 2.5000 mg | ORAL_TABLET | Freq: Every day | ORAL | 5 refills | Status: DC

## 2022-07-29 MED ORDER — LOSARTAN POTASSIUM 25 MG PO TABS: 25.0000 mg | ORAL_TABLET | Freq: Every day | ORAL | 5 refills | Status: DC

## 2022-07-29 NOTE — Assessment & Plan Note (Signed)
Last visit she was experiencing substernal chest pain and she did go to the ED for that and workup was negative CT coronary calcium score since she was here last shows a score of 0 She did cancel cardiology referral Chest pain has resolved

## 2022-07-29 NOTE — Patient Instructions (Addendum)
    BP 110-140/60-80 ideally - a little higher at times is ok if you feel ok     Medications changes include :   stop the valsartan.  Start losartan 25 mg daily and amlodipine 2.5 mg daily - take one in the morning and one in the evening.    Your prescription(s) have been sent to your pharmacy.    Return in about 6 weeks (around 09/09/2022) for follow up, hypertension.

## 2022-07-29 NOTE — Assessment & Plan Note (Addendum)
Chronic At her last visit valsartan was started-blood pressure is still variable at home and not ideally controlled She does not feel well when the blood pressure is too low or too high We will try amlodipine 2.5 mg daily at 1 time of the day and losartan 25 mg daily at a different time of day Hopefully this combination will balance out her blood pressure readings She can monitor if she wants or check on occasion Follow-up in about 6 weeks

## 2022-08-08 ENCOUNTER — Telehealth: Payer: Self-pay | Admitting: Internal Medicine

## 2022-08-08 NOTE — Telephone Encounter (Signed)
Spoke with patient today. 

## 2022-08-08 NOTE — Telephone Encounter (Signed)
Pt called and advised she has a UTI and wanted rx to clear it up. Advised pt she would need an ov. Pt declined ov and requested that I ask Dr. Quay Burow if she can put in a lab order for a urinalysis for pt.    Please advise

## 2022-08-09 ENCOUNTER — Encounter: Payer: Self-pay | Admitting: Emergency Medicine

## 2022-08-09 ENCOUNTER — Ambulatory Visit (INDEPENDENT_AMBULATORY_CARE_PROVIDER_SITE_OTHER): Payer: BC Managed Care – PPO | Admitting: Emergency Medicine

## 2022-08-09 VITALS — BP 130/80 | HR 69 | Temp 98.3°F | Ht 64.5 in

## 2022-08-09 DIAGNOSIS — R300 Dysuria: Secondary | ICD-10-CM | POA: Diagnosis not present

## 2022-08-09 DIAGNOSIS — N390 Urinary tract infection, site not specified: Secondary | ICD-10-CM | POA: Diagnosis not present

## 2022-08-09 LAB — POCT URINALYSIS DIPSTICK
Blood, UA: NEGATIVE
Glucose, UA: NEGATIVE
Ketones, UA: 5
Leukocytes, UA: NEGATIVE
Nitrite, UA: NEGATIVE
Protein, UA: POSITIVE — AB
Spec Grav, UA: 1.025 (ref 1.010–1.025)
Urobilinogen, UA: 0.2 E.U./dL
pH, UA: 5.5 (ref 5.0–8.0)

## 2022-08-09 MED ORDER — CEFUROXIME AXETIL 250 MG PO TABS: 250.0000 mg | ORAL_TABLET | Freq: Two times a day (BID) | ORAL | 0 refills | Status: AC

## 2022-08-09 MED ORDER — SULFAMETHOXAZOLE-TRIMETHOPRIM 800-160 MG PO TABS: 1.0000 | ORAL_TABLET | Freq: Two times a day (BID) | ORAL | 0 refills | Status: DC

## 2022-08-09 NOTE — Patient Instructions (Signed)
Urinary Tract Infection, Adult A urinary tract infection (UTI) is an infection of any part of the urinary tract. The urinary tract includes: The kidneys. The ureters. The bladder. The urethra. These organs make, store, and get rid of pee (urine) in the body. What are the causes? This infection is caused by germs (bacteria) in your genital area. These germs grow and cause swelling (inflammation) of your urinary tract. What increases the risk? The following factors may make you more likely to develop this condition: Using a small, thin tube (catheter) to drain pee. Not being able to control when you pee or poop (incontinence). Being female. If you are female, these things can increase the risk: Using these methods to prevent pregnancy: A medicine that kills sperm (spermicide). A device that blocks sperm (diaphragm). Having low levels of a female hormone (estrogen). Being pregnant. You are more likely to develop this condition if: You have genes that add to your risk. You are sexually active. You take antibiotic medicines. You have trouble peeing because of: A prostate that is bigger than normal, if you are female. A blockage in the part of your body that drains pee from the bladder. A kidney stone. A nerve condition that affects your bladder. Not getting enough to drink. Not peeing often enough. You have other conditions, such as: Diabetes. A weak disease-fighting system (immune system). Sickle cell disease. Gout. Injury of the spine. What are the signs or symptoms? Symptoms of this condition include: Needing to pee right away. Peeing small amounts often. Pain or burning when peeing. Blood in the pee. Pee that smells bad or not like normal. Trouble peeing. Pee that is cloudy. Fluid coming from the vagina, if you are female. Pain in the belly or lower back. Other symptoms include: Vomiting. Not feeling hungry. Feeling mixed up (confused). This may be the first symptom in  older adults. Being tired and grouchy (irritable). A fever. Watery poop (diarrhea). How is this treated? Taking antibiotic medicine. Taking other medicines. Drinking enough water. In some cases, you may need to see a specialist. Follow these instructions at home:  Medicines Take over-the-counter and prescription medicines only as told by your doctor. If you were prescribed an antibiotic medicine, take it as told by your doctor. Do not stop taking it even if you start to feel better. General instructions Make sure you: Pee until your bladder is empty. Do not hold pee for a long time. Empty your bladder after sex. Wipe from front to back after peeing or pooping if you are a female. Use each tissue one time when you wipe. Drink enough fluid to keep your pee pale yellow. Keep all follow-up visits. Contact a doctor if: You do not get better after 1-2 days. Your symptoms go away and then come back. Get help right away if: You have very bad back pain. You have very bad pain in your lower belly. You have a fever. You have chills. You feeling like you will vomit or you vomit. Summary A urinary tract infection (UTI) is an infection of any part of the urinary tract. This condition is caused by germs in your genital area. There are many risk factors for a UTI. Treatment includes antibiotic medicines. Drink enough fluid to keep your pee pale yellow. This information is not intended to replace advice given to you by your health care provider. Make sure you discuss any questions you have with your health care provider. Document Revised: 07/17/2020 Document Reviewed: 07/17/2020 Elsevier Patient Education    2023 [employer]  

## 2022-08-09 NOTE — Assessment & Plan Note (Signed)
Symptoms improved with Tylenol, cranberry juice, and probiotics. Advised to stay well-hydrated and monitor for symptoms of pyelonephritis.

## 2022-08-09 NOTE — Progress Notes (Signed)
[name] 76 y.o.   Chief Complaint  Patient presents with   Urinary Tract Infection    Pt c/o UTI x x 4 days tylenol for symptoms    HISTORY OF PRESENT ILLNESS: This is a 76 y.o. female complaining of possible UTI.  Developed UTI symptoms 4 days ago.  Complaining of burning on urination and urination frequency.  Denies fever or chills.  Denies flank pain but has some lower abdominal discomfort.  Able to eat and drink.  Denies nausea or vomiting. Urine cultures in the past grew E. coli sensitive to all antibiotics except nitrofurantoin.  Urinary Tract Infection  Associated symptoms include frequency and urgency. Pertinent negatives include no chills, flank pain, hematuria, nausea or vomiting.    Prior to Admission medications   Medication Sig Start Date End Date Taking? Authorizing Provider  acetaminophen (TYLENOL) 500 MG tablet Take 1,000 mg by mouth daily as needed for mild pain.   Yes [provider]  amLODipine (NORVASC) 2.5 MG tablet Take 1 tablet (2.5 mg total) by mouth daily. 07/29/22  Yes Burns, Claudina Lick, MD  CALCIUM PO Take by mouth daily.   Yes [provider]  cholecalciferol (VITAMIN D3) 25 MCG (1000 UNIT) tablet Take 1,000 Units by mouth daily.   Yes [provider]  CRANBERRY PO Take by mouth.   Yes [provider]  Cyanocobalamin (B-12 PO) Take by mouth.   Yes [provider]  eletriptan (RELPAX) 20 MG tablet TAKE 1 TABLET BY MOUTH EVERY 2 HOURS AS NEEDED FOR HEADACHE OR MIGRAINE. MAY REPEAT IN 2 HOURS IF HEADACHE PERSISTS OR RECURS. 02/09/21  Yes Burns, Claudina Lick, MD  hydroxychloroquine (PLAQUENIL) 200 MG tablet TAKE 1 TABLET BY MOUTH TWICE DAILY MONDAY THROUGH FRIDAY 06/17/22  Yes Ofilia Neas, PA-C  losartan (COZAAR) 25 MG tablet Take 1 tablet (25 mg total) by mouth daily. 07/29/22  Yes Burns, Claudina Lick, MD  Probiotic Product (PROBIOTIC DAILY PO) Take by mouth.   Yes [provider]  traMADol (ULTRAM) 50 MG tablet  Take 1 tablet (50 mg total) by mouth every 12 (twelve) hours as needed. 02/18/22  Yes Binnie Rail, MD  traZODone (DESYREL) 50 MG tablet TAKE 3 TABLETS(150 MG) BY MOUTH AT BEDTIME 07/08/22  Yes Burns, Claudina Lick, MD    Allergies  Allergen Reactions   Morphine Hives and Palpitations     Because of a history of documented adverse serious drug reaction;Medi Alert bracelet  is recommended   Albuterol Palpitations    REACTION: heart racing   Augmentin [Amoxicillin-Pot Clavulanate]     Abdominal cramping   Ciprofloxacin     Abdominal cramping   Fish Allergy Other (See Comments)    Severe headache   Gabapentin     Headaches w/ higher doses   Risedronate Sodium     Gi symptoms   Cyclobenzaprine Other (See Comments)    headaches   Ibandronate Sodium     Stomach cramps    Patient Active Problem List   Diagnosis Date Noted   Agatston coronary artery calcium score of 0,  2023 07/28/2022   Hypertension 07/05/2022   Renal lesion 04/14/2022   Coccyx pain 05/19/2021   B12 deficiency 04/15/2021   COVID-19 04/14/2021   Autoimmune disorder ([place]) 04/13/2021   At risk for long QT syndrome 01/21/2021   Acute cystitis without hematuria 01/21/2021   Lumbar back pain with radiculopathy affecting left lower extremity 08/05/2020   Other chest pain 03/10/2020   Shingles 11/19/2019  Pain in both knees 07/09/2019   Right arm pain 02/26/2019   Acute pain of left knee 08/27/2018   Hand arthritis 06/13/2018   Trigger middle finger of right hand 06/13/2018   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   Intermittent palpitations 10/11/2017   Migraine 12/30/2015   Upper airway cough syndrome 10/29/2015   Osteoporosis 06/06/2011   Vitamin D deficiency 03/22/2010   BREAST CANCER, HX OF 03/22/2010   SPINAL STENOSIS, LUMBAR 03/25/2008   HYPERLIPIDEMIA 09/28/2007   Fibromyalgia 05/24/2007    Past Medical History:  Diagnosis Date   Arthritis    Breast cancer ([place])    R lumpectomy ; radiation; no chemotherapy    Bronchitis    recurrent   Bulging lumbar disc    Cataract    multifocal lens implant   Chronic low back pain    bulging disc & spinal stenosis   Coccygeal fracture ([place]) 05/15/2021   COVID-19    Cystitis    Fibromyalgia    Hyperlipemia    Mitral valve prolapse    Osteoporosis    Personal history of radiation therapy    Pneumonia    as OP X 2; no Pneumovax   Vitamin D deficiency    19 in  01/2009    Past Surgical History:  Procedure Laterality Date   ABDOMINAL HYSTERECTOMY  1990   [facility] for fibroid , Dr Maryruth Eve   BREAST LUMPECTOMY Right    BREAST SURGERY  2010   R side lumpectomy; Dr Margot Chimes   CATARACT EXTRACTION, BILATERAL  2017   CESAREAN SECTION      G 2 P 2   CYSTECTOMY  2010   EYE SURGERY     eye lid surgery   FOOT SURGERY Right    no colonoscopy     "I just don't want to do it". SOC reviewed    Social History   Socioeconomic History   Marital status: Married    Spouse name: Not on file   Number of children: Not on file   Years of education: Not on file   Highest education level: Not on file  Occupational History   Not on file  Tobacco Use   Smoking status: Former    Packs/day: 0.10    Years: 6.00    Total pack years: 0.60    Types: Cigarettes    Quit date: 12/19/1970    Years since quitting: 51.6    Passive exposure: Never   Smokeless tobacco: Never   Tobacco comments:    [phone], up to 3 cigarettes / day  Vaping Use   Vaping Use: Never used  Substance and Sexual Activity   Alcohol use: No   Drug use: No   Sexual activity: Not on file  Other Topics Concern   Not on file  Social History Narrative   Not on file   Social Determinants of Health   Financial Resource Strain: Not on file  Food Insecurity: Not on file  Transportation Needs: Not on file  Physical Activity: Not on file  Stress: Not on file  Social Connections: Not on file  Intimate Partner Violence: Not on file    Family History  Problem Relation Age of Onset   Atrial  fibrillation Mother        S/P cardioversion   Ulcers Mother    Hypertension Mother    Dementia Mother    Heart attack Father 45       died @ 15   Hypertension Brother  Diabetes Brother    Atrial fibrillation Brother    Heart attack Paternal Aunt        < 21   Heart attack Paternal Uncle        > 79   Heart attack Paternal Grandmother        < 31   Heart attack Paternal Grandfather        > 97   Thyroid disease Daughter        on Synthroid since high school   Healthy Son    Stroke Neg Hx    Breast cancer Neg Hx      Review of Systems  Constitutional: Negative.  Negative for chills and fever.  HENT: Negative.  Negative for congestion and sore throat.   Respiratory: Negative.  Negative for cough and shortness of breath.   Cardiovascular: Negative.  Negative for chest pain and palpitations.  Gastrointestinal:  Negative for abdominal pain, diarrhea, nausea and vomiting.  Genitourinary:  Positive for dysuria, frequency and urgency. Negative for flank pain and hematuria.  Skin: Negative.  Negative for rash.  Neurological:  Negative for dizziness and headaches.  All other systems reviewed and are negative.  Today's Vitals   08/09/22 1353  BP: 130/80  Pulse: 69  Temp: 98.3 F (36.8 C)  TempSrc: Oral  SpO2: 98%  Height: 5' 4.5" (1.638 m)   Body mass index is 21.97 kg/m.  Physical Exam Vitals reviewed.  Constitutional:      Appearance: Normal appearance.  HENT:     Head: Normocephalic.  Eyes:     Extraocular Movements: Extraocular movements intact.     Pupils: Pupils are equal, round, and reactive to light.  Cardiovascular:     Rate and Rhythm: Normal rate.  Pulmonary:     Effort: Pulmonary effort is normal.  Abdominal:     Palpations: Abdomen is soft.     Tenderness: There is no abdominal tenderness. There is no right CVA tenderness or left CVA tenderness.  Skin:    General: Skin is warm and dry.     Capillary Refill: Capillary refill takes less than 2  seconds.  Neurological:     General: No focal deficit present.     Mental Status: She is alert and oriented to person, place, and time.  Psychiatric:        Mood and Affect: Mood normal.        Behavior: Behavior normal.   Results for orders placed or performed in visit on 08/09/22 (from the past 24 hour(s))  POCT Urinalysis Dipstick     Status: Abnormal   Collection Time: 08/09/22  2:55 PM  Result Value Ref Range   Color, UA yellow    Clarity, UA clear    Glucose, UA Negative Negative   Bilirubin, UA 2+    Ketones, UA 5    Spec Grav, UA 1.025 1.010 - 1.025   Blood, UA neg    pH, UA 5.5 5.0 - 8.0   Protein, UA Positive (A) Negative   Urobilinogen, UA 0.2 0.2 or 1.0 E.U./dL   Nitrite, UA neg    Leukocytes, UA Negative Negative   Appearance     Odor      ASSESSMENT & PLAN: A total of 41 minutes was spent with the patient and counseling/coordination of care regarding preparing for this visit, review of available medical records, review of multiple chronic medical problems and their management, review of all medications, diagnosis of UTI and its treatment, need for antibiotics, prognosis,  documentation and need for follow-up.   Problem List Items Addressed This Visit       Genitourinary   Acute UTI - Primary    Urinalysis suggestive of urinary tract infection. Urine sent for culture. Last culture grew E. coli sensitive to all antibiotics except nitrofurantoin. We will start Ceftin 250 mg twice a day for 7 days. Advised to contact the office if no better or worse during the next several days.      Relevant Medications   cefUROXime (CEFTIN) 250 MG tablet     Other   Dysuria    Symptoms improved with Tylenol, cranberry juice, and probiotics. Advised to stay well-hydrated and monitor for symptoms of pyelonephritis.      Relevant Orders   Urine Culture   POCT Urinalysis Dipstick   Patient Instructions  Urinary Tract Infection, Adult A urinary tract infection (UTI) is  an infection of any part of the urinary tract. The urinary tract includes: The kidneys. The ureters. The bladder. The urethra. These organs make, store, and get rid of pee (urine) in the body. What are the causes? This infection is caused by germs (bacteria) in your genital area. These germs grow and cause swelling (inflammation) of your urinary tract. What increases the risk? The following factors may make you more likely to develop this condition: Using a small, thin tube (catheter) to drain pee. Not being able to control when you pee or poop (incontinence). Being female. If you are female, these things can increase the risk: Using these methods to prevent pregnancy: A medicine that kills sperm (spermicide). A device that blocks sperm (diaphragm). Having low levels of a female hormone (estrogen). Being pregnant. You are more likely to develop this condition if: You have genes that add to your risk. You are sexually active. You take antibiotic medicines. You have trouble peeing because of: A prostate that is bigger than normal, if you are female. A blockage in the part of your body that drains pee from the bladder. A kidney stone. A nerve condition that affects your bladder. Not getting enough to drink. Not peeing often enough. You have other conditions, such as: Diabetes. A weak disease-fighting system (immune system). Sickle cell disease. Gout. Injury of the spine. What are the signs or symptoms? Symptoms of this condition include: Needing to pee right away. Peeing small amounts often. Pain or burning when peeing. Blood in the pee. Pee that smells bad or not like normal. Trouble peeing. Pee that is cloudy. Fluid coming from the vagina, if you are female. Pain in the belly or lower back. Other symptoms include: Vomiting. Not feeling hungry. Feeling mixed up (confused). This may be the first symptom in older adults. Being tired and grouchy (irritable). A  fever. Watery poop (diarrhea). How is this treated? Taking antibiotic medicine. Taking other medicines. Drinking enough water. In some cases, you may need to see a specialist. Follow these instructions at home:  Medicines Take over-the-counter and prescription medicines only as told by your doctor. If you were prescribed an antibiotic medicine, take it as told by your doctor. Do not stop taking it even if you start to feel better. General instructions Make sure you: Pee until your bladder is empty. Do not hold pee for a long time. Empty your bladder after sex. Wipe from front to back after peeing or pooping if you are a female. Use each tissue one time when you wipe. Drink enough fluid to keep your pee pale yellow. Keep all follow-up visits.  Contact a doctor if: You do not get better after 1-2 days. Your symptoms go away and then come back. Get help right away if: You have very bad back pain. You have very bad pain in your lower belly. You have a fever. You have chills. You feeling like you will vomit or you vomit. Summary A urinary tract infection (UTI) is an infection of any part of the urinary tract. This condition is caused by germs in your genital area. There are many risk factors for a UTI. Treatment includes antibiotic medicines. Drink enough fluid to keep your pee pale yellow. This information is not intended to replace advice given to you by your health care provider. Make sure you discuss any questions you have with your health care provider. Document Revised: 07/17/2020 Document Reviewed: 07/17/2020 Elsevier Patient Education  [place], MD [facility]

## 2022-08-09 NOTE — Assessment & Plan Note (Signed)
Urinalysis suggestive of urinary tract infection. Urine sent for culture. Last culture grew E. coli sensitive to all antibiotics except nitrofurantoin. We will start Ceftin 250 mg twice a day for 7 days. Advised to contact the office if no better or worse during the next several days.

## 2022-08-10 LAB — URINE CULTURE: Result:: NO GROWTH

## 2022-08-12 NOTE — Progress Notes (Signed)
Continue and finish antibiotic then.Thanks.

## 2022-08-29 ENCOUNTER — Encounter: Payer: Self-pay | Admitting: Internal Medicine

## 2022-09-07 DIAGNOSIS — L738 Other specified follicular disorders: Secondary | ICD-10-CM | POA: Diagnosis not present

## 2022-09-07 DIAGNOSIS — L814 Other melanin hyperpigmentation: Secondary | ICD-10-CM | POA: Diagnosis not present

## 2022-09-08 NOTE — Progress Notes (Unsigned)
Subjective:    Patient ID: [name], female    DOB: [birth year], 76 y.o.   MRN: [id number]     HPI [name] is here for follow up of her chronic medical problems, including htn  BP medication changed to amlodipine 2.5 mg daily and losartan 25 mg daily at the other part of the day.  Bp at home 105/77 - 166/99.  She is not checking it regularly as I have advised her not to check it too regularly.   Having sciatica - had injectioin in April.  That did help for a while, but pain has recurred.  Pain radiating down the right leg.  She has taken the tramadol-only half a pill as needed and that has helped.  She is also taking Tylenol.  Working with a Clinical research associate   Medications and allergies reviewed with patient and updated if appropriate.  Current Outpatient Medications on File Prior to Visit  Medication Sig Dispense Refill   acetaminophen (TYLENOL) 500 MG tablet Take 1,000 mg by mouth daily as needed for mild pain.     amLODipine (NORVASC) 2.5 MG tablet Take 1 tablet (2.5 mg total) by mouth daily. 30 tablet 5   CALCIUM PO Take by mouth daily.     cholecalciferol (VITAMIN D3) 25 MCG (1000 UNIT) tablet Take 1,000 Units by mouth daily.     CRANBERRY PO Take by mouth.     Cyanocobalamin (B-12 PO) Take by mouth.     eletriptan (RELPAX) 20 MG tablet TAKE 1 TABLET BY MOUTH EVERY 2 HOURS AS NEEDED FOR HEADACHE OR MIGRAINE. MAY REPEAT IN 2 HOURS IF HEADACHE PERSISTS OR RECURS. 9 tablet 1   hydroxychloroquine (PLAQUENIL) 200 MG tablet TAKE 1 TABLET BY MOUTH TWICE DAILY MONDAY THROUGH FRIDAY 120 tablet 0   losartan (COZAAR) 25 MG tablet Take 1 tablet (25 mg total) by mouth daily. 30 tablet 5   Probiotic Product (PROBIOTIC DAILY PO) Take by mouth.     traMADol (ULTRAM) 50 MG tablet Take 1 tablet (50 mg total) by mouth every 12 (twelve) hours as needed. 60 tablet 0   traZODone (DESYREL) 50 MG tablet TAKE 3 TABLETS(150 MG) BY MOUTH AT BEDTIME 270 tablet 1   No current facility-administered  medications on file prior to visit.     Review of Systems  Respiratory:  Negative for shortness of breath.   Cardiovascular:  Negative for chest pain, palpitations and leg swelling.  Musculoskeletal:  Positive for back pain (Pain on right leg).  Neurological:  Negative for light-headedness and headaches.       Objective:   Vitals:   09/09/22 1316  BP: (!) 140/92  Pulse: 75  Temp: 97.9 F (36.6 C)  SpO2: 98%   BP Readings from Last 3 Encounters:  09/09/22 (!) 140/92  08/09/22 130/80  07/29/22 130/80   Wt Readings from Last 3 Encounters:  06/28/22 130 lb (59 kg)  12/06/21 130 lb (59 kg)  10/27/21 128 lb (58.1 kg)   Body mass index is 22.31 kg/m.    Physical Exam Constitutional:      General: She is not in acute distress.    Appearance: Normal appearance.  HENT:     Head: Normocephalic and atraumatic.  Eyes:     Conjunctiva/sclera: Conjunctivae normal.  Cardiovascular:     Rate and Rhythm: Normal rate and regular rhythm.     Heart sounds: Normal heart sounds. No murmur heard. Pulmonary:     Effort: Pulmonary effort is normal.  No respiratory distress.     Breath sounds: Normal breath sounds. No wheezing.  Musculoskeletal:     Cervical back: Neck supple.     Right lower leg: No edema.     Left lower leg: No edema.  Lymphadenopathy:     Cervical: No cervical adenopathy.  Skin:    General: Skin is warm and dry.     Findings: No rash.  Neurological:     Mental Status: She is alert. Mental status is at baseline.  Psychiatric:        Mood and Affect: Mood normal.        Behavior: Behavior normal.        Lab Results  Component Value Date   WBC 5.6 04/11/2022   HGB 12.8 04/11/2022   HCT 38.6 04/11/2022   PLT 235.0 04/11/2022   GLUCOSE 97 04/11/2022   CHOL 218 (H) 04/11/2022   TRIG 110.0 04/11/2022   HDL 67.70 04/11/2022   LDLDIRECT 122.6 06/03/2013   LDLCALC 128 (H) 04/11/2022   ALT 13 04/11/2022   AST 16 04/11/2022   NA 142 04/11/2022   K 4.3  04/11/2022   CL 107 04/11/2022   CREATININE 1.04 04/11/2022   BUN 16 04/11/2022   CO2 26 04/11/2022   TSH 3.67 04/11/2022   HGBA1C 5.2 03/06/2020     Assessment & Plan:    See Problem List for Assessment and Plan of chronic medical problems.

## 2022-09-09 ENCOUNTER — Encounter: Payer: Self-pay | Admitting: Internal Medicine

## 2022-09-09 ENCOUNTER — Ambulatory Visit (INDEPENDENT_AMBULATORY_CARE_PROVIDER_SITE_OTHER): Payer: BC Managed Care – PPO | Admitting: Internal Medicine

## 2022-09-09 VITALS — BP 140/92 | HR 75 | Temp 97.9°F | Ht 64.0 in

## 2022-09-09 DIAGNOSIS — Z23 Encounter for immunization: Secondary | ICD-10-CM

## 2022-09-09 DIAGNOSIS — I10 Essential (primary) hypertension: Secondary | ICD-10-CM | POA: Diagnosis not present

## 2022-09-09 DIAGNOSIS — M5416 Radiculopathy, lumbar region: Secondary | ICD-10-CM | POA: Diagnosis not present

## 2022-09-09 MED ORDER — LOSARTAN POTASSIUM 25 MG PO TABS: 25.0000 mg | ORAL_TABLET | Freq: Two times a day (BID) | ORAL | 5 refills | Status: DC

## 2022-09-09 NOTE — Patient Instructions (Addendum)
      Medications changes include :   increase losartan to 25 mg twice daily   Your prescription(s) have been sent to your pharmacy.     Return in about 6 months (around 03/10/2023) for follow up.

## 2022-09-10 NOTE — Assessment & Plan Note (Signed)
Chronic Blood pressure seems to be variable at home, but borderline here We will continue amlodipine 2.5 mg daily Increase losartan to 25 mg twice daily She will continue to monitor her blood pressure at home and let me know if there is any concerns-she has had some low readings at home in the recent past and advised her I do not want her readings to be too low or too high

## 2022-09-10 NOTE — Assessment & Plan Note (Signed)
Acute History of lumbar radiculopathy Currently experiencing back pain with pain down right leg Can see orthopedics if she needs to-can consider injections, but she would like to avoid that Continue tramadol 25-50 mg twice daily as needed-I will refill when and if needed Continue Tylenol up to 3000 mg daily-advised that she can take Tylenol when she takes the tramadol

## 2022-09-20 ENCOUNTER — Other Ambulatory Visit: Payer: Self-pay | Admitting: *Deleted

## 2022-09-20 MED ORDER — HYDROXYCHLOROQUINE SULFATE 200 MG PO TABS
ORAL_TABLET | ORAL | 0 refills | Status: DC
Start: 2022-09-20 — End: 2022-12-20

## 2022-09-20 NOTE — Telephone Encounter (Signed)
Refill request received via fax from [facility] for PLQ.  Next Visit: 11/28/2022  Last Visit: 06/28/2022  Labs: 04/11/2022 GFR 52.71  Eye exam: 12/30/2021 WNL   Current Dose per office note 06/28/2022: Hydroxychloroquine 200 mg 1 tablet by mouth twice daily Monday to Friday.  YQ:[id number] disease   Last Fill: 06/17/2022  Patient reminded she is due to update labs this month. Patient states she will try to come this week.   Okay to refill Plaquenil?

## 2022-09-22 ENCOUNTER — Other Ambulatory Visit: Payer: Self-pay

## 2022-09-22 DIAGNOSIS — Z79899 Other long term (current) drug therapy: Secondary | ICD-10-CM

## 2022-09-22 DIAGNOSIS — M359 Systemic involvement of connective tissue, unspecified: Secondary | ICD-10-CM

## 2022-09-23 LAB — CBC WITH DIFFERENTIAL/PLATELET
Absolute Monocytes: 431 cells/uL (ref 200–950)
Basophils Absolute: 29 cells/uL (ref 0–200)
Basophils Relative: 0.6 %
Eosinophils Absolute: 49 cells/uL (ref 15–500)
Eosinophils Relative: 1 %
HCT: 37 % (ref 35.0–45.0)
Hemoglobin: 12.5 g/dL (ref 11.7–15.5)
Lymphs Abs: 1117 cells/uL (ref 850–3900)
MCH: 31 pg (ref 27.0–33.0)
MCHC: 33.8 g/dL (ref 32.0–36.0)
MCV: 91.8 fL (ref 80.0–100.0)
MPV: 9.9 fL (ref 7.5–12.5)
Monocytes Relative: 8.8 %
Neutro Abs: 3273 cells/uL (ref 1500–7800)
Neutrophils Relative %: 66.8 %
Platelets: 282 10*3/uL (ref 140–400)
RBC: 4.03 10*6/uL (ref 3.80–5.10)
RDW: 12.2 % (ref 11.0–15.0)
Total Lymphocyte: 22.8 %
WBC: 4.9 10*3/uL (ref 3.8–10.8)

## 2022-09-23 LAB — COMPLETE METABOLIC PANEL WITH GFR
AG Ratio: 2.1 (calc) (ref 1.0–2.5)
ALT: 17 U/L (ref 6–29)
AST: 18 U/L (ref 10–35)
Albumin: 4.4 g/dL (ref 3.6–5.1)
Alkaline phosphatase (APISO): 61 U/L (ref 37–153)
BUN/Creatinine Ratio: 19 (calc) (ref 6–22)
BUN: 24 mg/dL (ref 7–25)
CO2: 26 mmol/L (ref 20–32)
Calcium: 9.7 mg/dL (ref 8.6–10.4)
Chloride: 105 mmol/L (ref 98–110)
Creat: 1.25 mg/dL — ABNORMAL HIGH (ref 0.60–1.00)
Globulin: 2.1 g/dL (calc) (ref 1.9–3.7)
Glucose, Bld: 85 mg/dL (ref 65–99)
Potassium: 4.8 mmol/L (ref 3.5–5.3)
Sodium: 139 mmol/L (ref 135–146)
Total Bilirubin: 0.3 mg/dL (ref 0.2–1.2)
Total Protein: 6.5 g/dL (ref 6.1–8.1)
eGFR: 45 mL/min/{1.73_m2} — ABNORMAL LOW (ref >=60)

## 2022-09-23 LAB — C3 AND C4
C3 Complement: 128 mg/dL (ref 83–193)
C4 Complement: 33 mg/dL (ref 15–57)

## 2022-09-23 LAB — SEDIMENTATION RATE: Sed Rate: 6 mm/h (ref 0–30)

## 2022-09-23 LAB — PROTEIN / CREATININE RATIO, URINE
Creatinine, Urine: 107 mg/dL (ref 20–275)
Protein/Creat Ratio: 84 mg/g creat (ref 24–184)
Protein/Creatinine Ratio: 0.084 mg/mg creat (ref 0.024–0.184)
Total Protein, Urine: 9 mg/dL (ref 5–24)

## 2022-09-23 LAB — ANTI-DNA ANTIBODY, DOUBLE-STRANDED: ds DNA Ab: <1 IU/mL

## 2022-09-25 NOTE — Progress Notes (Signed)
CBC normal, CMP normal except creatinine is elevated, sed rate normal, complements normal, double-stranded DNA negative, urine protein negative.  Please forward labs to patient's PCP.  Please advise patient to increase water intake.

## 2022-09-26 ENCOUNTER — Encounter: Payer: Self-pay | Admitting: Internal Medicine

## 2022-09-26 ENCOUNTER — Encounter: Payer: Self-pay | Admitting: Rheumatology

## 2022-09-26 NOTE — Telephone Encounter (Signed)
Please document why the patient would like to discontinue plaquenil?   Plaquenil is not a medication that requires titration but that being said I would recommend gradually reducing the dose to make sure she does not experience any symptoms of a flare.   She is currently taking plaquenil 200 mg 1 tablet by mouth twice daily Monday through Friday.  She can try reducing prednisone to 1 tablet M-F and recheck autoimmune lab work in 2-3 months.

## 2022-09-27 NOTE — Progress Notes (Signed)
She may discontinue Plaquenil.  We will repeat labs after stopping Plaquenil at the follow-up visit.

## 2022-10-06 ENCOUNTER — Other Ambulatory Visit: Payer: Self-pay | Admitting: Internal Medicine

## 2022-10-06 MED ORDER — NEBIVOLOL HCL 2.5 MG PO TABS: 2.5000 mg | ORAL_TABLET | Freq: Every day | ORAL | 0 refills | Status: DC

## 2022-10-11 ENCOUNTER — Ambulatory Visit: Payer: BC Managed Care – PPO | Admitting: Rheumatology

## 2022-11-15 ENCOUNTER — Telehealth: Payer: Self-pay | Admitting: Internal Medicine

## 2022-11-15 ENCOUNTER — Encounter: Payer: Self-pay | Admitting: Internal Medicine

## 2022-11-15 NOTE — Progress Notes (Signed)
Office Visit Note  Patient: [name]             Date of Birth: March 17, 1946           MRN: [id number]             PCP: Binnie Rail, MD Referring: Binnie Rail, MD Visit Date: 11/28/2022 Occupation: _0 @  Subjective:  Lower back pain   History of Present Illness: [name] is a 76 y.o. female with history of autoimmune disease and osteoarthritis.  She is taking Hydroxychloroquine 200 mg 1 tablet by mouth twice daily Monday to Friday.  She is tolerating hydroxychloroquine without any side effects and has not missed any doses recently.  She denies any signs or symptoms of an autoimmune disease flare.  She has not had any recent rashes or oral or nasal ulcerations.  She has intermittent symptoms of Raynaud's phenomenon but denies any digital ulcerations.  Her biggest concern has been the chronic pain in her lower back.  She tried physical therapy as well as dry needling which exacerbated her symptoms.  She is scheduled for a follow-up visit with Dr. Sonia Side Vo tomorrow to discuss the next steps in treatment.  Patient reports that she continues to experience pain and intermittent swelling on the dorsal aspect of her right foot.  She denies any discomfort in her ankle joint at this time.  She states that the foot remains swollen throughout the day.  She denies any other joint pain or joint swelling at this time.     Activities of Daily Living:  Patient reports morning stiffness for 24 hours.   Patient Reports nocturnal pain.  Difficulty dressing/grooming: Reports Difficulty climbing stairs: Reports Difficulty getting out of chair: Reports Difficulty using hands for taps, buttons, cutlery, and/or writing: Reports  Review of Systems  Constitutional:  Positive for fatigue.  HENT:  Positive for mouth sores and mouth dryness. Negative for nose dryness.   Eyes:  Positive for dryness. Negative for pain and visual disturbance.  Respiratory:  Negative for cough, hemoptysis,  shortness of breath and difficulty breathing.   Cardiovascular:  Negative for chest pain, palpitations, hypertension and swelling in legs/feet.  Gastrointestinal:  Positive for constipation. Negative for blood in stool and diarrhea.  Endocrine: Negative for increased urination.  Genitourinary:  Negative for painful urination and involuntary urination.  Musculoskeletal:  Positive for joint pain, joint pain, joint swelling, myalgias, muscle weakness, morning stiffness, muscle tenderness and myalgias. Negative for gait problem.  Skin:  Positive for hair loss. Negative for color change, pallor, rash, nodules/bumps, skin tightness, ulcers and sensitivity to sunlight.  Allergic/Immunologic: Negative for susceptible to infections.  Neurological:  Negative for dizziness, numbness, headaches and weakness.  Hematological:  Negative for swollen glands.  Psychiatric/Behavioral:  Negative for depressed mood and sleep disturbance. The patient is not nervous/anxious.     PMFS History:  Patient Active Problem List   Diagnosis Date Noted   Lumbar radiculopathy 09/09/2022   Dysuria 08/09/2022   Acute UTI 08/09/2022   Agatston coronary artery calcium score of 0,  2023 07/28/2022   Hypertension 07/05/2022   Renal lesion 04/14/2022   Coccyx pain 05/19/2021   B12 deficiency 04/15/2021   COVID-19 04/14/2021   Autoimmune disorder ([place]) 04/13/2021   At risk for long QT syndrome 01/21/2021   Acute cystitis without hematuria 01/21/2021   Lumbar back pain with radiculopathy affecting left lower extremity 08/05/2020   Other chest pain 03/10/2020   Shingles 11/19/2019   Pain  in both knees 07/09/2019   Right arm pain 02/26/2019   Acute pain of left knee 08/27/2018   Hand arthritis 06/13/2018   Trigger middle finger of right hand 06/13/2018   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   Intermittent palpitations 10/11/2017   Migraine 12/30/2015   Upper airway cough syndrome 10/29/2015   Osteoporosis 06/06/2011    Vitamin D deficiency 03/22/2010   BREAST CANCER, HX OF 03/22/2010   SPINAL STENOSIS, LUMBAR 03/25/2008   HYPERLIPIDEMIA 09/28/2007   Fibromyalgia 05/24/2007    Past Medical History:  Diagnosis Date   Arthritis    Breast cancer ([place])    R lumpectomy ; radiation; no chemotherapy   Bronchitis    recurrent   Bulging lumbar disc    Cataract    multifocal lens implant   Chronic low back pain    bulging disc & spinal stenosis   Coccygeal fracture ([facility]) 05/15/2021   COVID-19    Cystitis    Fibromyalgia    Hyperlipemia    Mitral valve prolapse    Osteoporosis    Personal history of radiation therapy    Pneumonia    as OP X 2; no Pneumovax   Vitamin D deficiency    19 in  01/2009    Family History  Problem Relation Age of Onset   Atrial fibrillation Mother        S/P cardioversion   Ulcers Mother    Hypertension Mother    Dementia Mother    Heart attack Father 75       died @ 23   Hypertension Brother    Diabetes Brother    Atrial fibrillation Brother    Heart attack Paternal Aunt        < 51   Heart attack Paternal Uncle        > 61   Heart attack Paternal Grandmother        < 5   Heart attack Paternal Grandfather        > 4   Thyroid disease Daughter        on Synthroid since high school   Healthy Son    Stroke Neg Hx    Breast cancer Neg Hx    Past Surgical History:  Procedure Laterality Date   ABDOMINAL HYSTERECTOMY  1990   [facility] for fibroid , Dr Maryruth Eve   BREAST LUMPECTOMY Right    BREAST SURGERY  2010   R side lumpectomy; Dr Margot Chimes   CATARACT EXTRACTION, BILATERAL  2017   CESAREAN SECTION      G 2 P 2   CYSTECTOMY  2010   EYE SURGERY     eye lid surgery   FOOT SURGERY Right    no colonoscopy     "I just don't want to do it". SOC reviewed   Social History   Social History Narrative   Not on file   Immunization History  Administered Date(s) Administered   Fluad Quad(high Dose 65+) 10/02/2019, 09/09/2022   PFIZER(Purple Top)SARS-COV-2  Vaccination 01/08/2020, 01/27/2020, 10/17/2020   Td 12/20/1999     Objective: Vital Signs: BP 116/77 (BP Location: Left Arm, Patient Position: Sitting, Cuff Size: Normal)   Pulse 62   Resp 16   Ht _0  (1.626 m)   BMI 22.31 kg/m    Physical Exam Vitals and nursing note reviewed.  Constitutional:      Appearance: She is well-developed.  HENT:     Head: Normocephalic and atraumatic.  Eyes:     Conjunctiva/sclera:  Conjunctivae normal.  Cardiovascular:     Rate and Rhythm: Normal rate and regular rhythm.     Heart sounds: Normal heart sounds.  Pulmonary:     Effort: Pulmonary effort is normal.     Breath sounds: Normal breath sounds.  Abdominal:     General: Bowel sounds are normal.     Palpations: Abdomen is soft.  Musculoskeletal:     Cervical back: Normal range of motion.  Skin:    General: Skin is warm and dry.     Capillary Refill: Capillary refill takes less than 2 seconds.  Neurological:     Mental Status: She is alert and oriented to person, place, and time.  Psychiatric:        Behavior: Behavior normal.      Musculoskeletal Exam: C-spine has good ROM.  Painful ROM of lumbar spine.  Shoulder joints have good ROM with some discomfort in the right shoulder.  Elbow joints, wrist joints, MCPs, PIPs, and DIPs good ROM with no synovitis.  Complete fist formation bilaterally.  Left hip has good ROM with no groin pain. Right hip has discomfort with ROM.  Knee joints have good ROM with some fullness in the left knee.  Ankle joints have good ROM with no tenderness or joint swelling.  Tenderness over the dorsal aspect of the right foot and tenderness and thickening of the right 1st MTP and PIP joint.  No evidence of achilles tendonitis or plantar fasciitis.    CDAI Exam: CDAI Score: -- Patient Global: --; Provider Global: -- Swollen: --; Tender: -- Joint Exam 11/28/2022   No joint exam has been documented for this visit   There is currently no information documented on  the homunculus. Go to the Rheumatology activity and complete the homunculus joint exam.  Investigation: No additional findings.  Imaging: No results found.  Recent Labs: Lab Results  Component Value Date   WBC 4.9 09/22/2022   HGB 12.5 09/22/2022   PLT 282 09/22/2022   NA 139 09/22/2022   K 4.8 09/22/2022   CL 105 09/22/2022   CO2 26 09/22/2022   GLUCOSE 85 09/22/2022   BUN 24 09/22/2022   CREATININE 1.25 (H) 09/22/2022   BILITOT 0.3 09/22/2022   ALKPHOS 61 04/11/2022   AST 18 09/22/2022   ALT 17 09/22/2022   PROT 6.5 09/22/2022   ALBUMIN 4.4 04/11/2022   CALCIUM 9.7 09/22/2022   GFRAA 58 (L) 05/24/2021   QFTBGOLDPLUS NEGATIVE 10/27/2021    Speciality Comments: PLQ Eye Exam: 11/18/2022 WNL @ [facility] Follow up in 6 months.  Procedures:  No procedures performed Allergies: Morphine, Albuterol, Augmentin [amoxicillin-pot clavulanate], Ciprofloxacin, Fish allergy, Gabapentin, Risedronate sodium, Cyclobenzaprine, and Ibandronate sodium    Assessment / Plan:     Visit Diagnoses: Autoimmune disease ([place]) - ANA 1: 640NS, history of dry eyes, hair loss, Raynaud's, inflammatory arthritis: She is not exhibiting any signs or symptoms of an autoimmune disease flare at this time.  She has clinically been doing well taking Plaquenil 200 mg 1 tablet by mouth twice daily Monday through Friday.  She has been experiencing persistent pain in the right foot and notices swelling on the dorsal aspect.  On examination today she has tenderness and thickening over the right first MTP joint.  X-rays from 06/28/2022 were reviewed today in the office which are consistent with osteoarthritic changes.  Offered to schedule an ultrasound to assess for synovitis but she has declined at this time.  She has not experienced any other  symptoms of an autoimmune disease flare recently.    Lab work from 09/22/22 was reviewed today in the office: dsDNA negative, complements WNL, ESR WNL, no proteinuria.   Labs were not consistent with a flare at that time.  Patient plans on having updated lab work at her follow-up visit in 4 months.  Future orders for the following lab work will be placed today.  She will remain on Plaquenil as prescribed.  She was advised to notify us if she develops signs or symptoms of a flare.  She will follow-up in the office in 4 months or sooner if needed. - Plan: CBC with Differential/Platelet, COMPLETE METABOLIC PANEL WITH GFR, Protein / creatinine ratio, urine, Anti-DNA antibody, double-stranded, C3 and C4, Sedimentation rate, ANA  High risk medication use - Hydroxychloroquine 200 mg 1 tablet by mouth twice daily Monday to Friday. PLQ Eye Exam: 11/18/2022 WNL @ [facility]  CBC and CMP updated on 09/22/22.  - Plan: CBC with Differential/Platelet, COMPLETE METABOLIC PANEL WITH GFR  Chronic right shoulder pain: Chronic.  She has painful ROM of the right shoulder joint.   Primary osteoarthritis of both hands: She has PIP and DIP thickening consistent with osteoarthritis of both hands.  No synovitis was noted.  Trigger middle finger of right hand: Intermittent tenderness and locking.   Primary osteoarthritis of right hip: She has slightly limited ROM with mild discomfort on examination today.   Effusion, left knee - MRI of left knee 10/15/21: Intact ligamentous structures and no acute bony findings.  No meniscal tears.  Tricompartmental degenerative changes most significant.  Patient continues to experience intermittent discomfort and inflammation.  On examination today no effusion was noted.  Primary osteoarthritis of both feet: X-rays of both feet were updated at her last office visit on 06/28/2022 which were consistent with osteoarthritic changes.  No erosive changes were noted at that time.  She has had persistent discomfort in her right foot since then.  She notices diffuse swelling and tenderness on the dorsal aspect of her right foot.  On examination she has  good range of motion of her right ankle joint with no tenderness or synovitis.  No evidence of Achilles tendinitis or plantar fasciitis.  She has some tenderness and thickening over the right first MTP joint no obvious synovitis was noted.  Offered to schedule an ultrasound of both feet to assess for synovitis but she has declined at this time.  She will notify us when and if she would like to schedule an ultrasound to assess for inflammation.  Pain in right foot - X-rays were consistent with osteoarthritis on 06/28/2022.  No erosive changes were noted.  She has some tenderness and thickening over the right first MTP joint but no obvious synovitis was noted.  Offered to schedule an ultrasound of both feet to assess for synovitis but she would like to hold off at this time.  DDD (degenerative disc disease), lumbar: Chronic pain.  Her discomfort has been interfering with her quality of life.  She tried physical therapy with no improvement.  She also tried dry needling which exacerbated her symptoms.  She has upcoming appointment scheduled tomorrow with Dr. Ron Agee to discuss the next steps in treatment.  Closed fracture of coccyx with routine healing, subsequent encounter: Patient has declined treatment for osteoporosis.  Her next bone density is due in May 2024.  Age-related osteoporosis without current pathological fracture - 04/23/21 DEXA scan showed left femoral neck T score -2.7.  Intolerance to Fosamax and  Boniva in the past.  She does not want to take any treatment at this point.  She is taking vitamin D and calcium supplements daily.  Vitamin D deficiency: She is taking vitamin D 1000 units daily.  Fibromyalgia -She continues to experience intermittent myalgias and muscle tenderness due to fibromyalgia.  She remains on tramadol 50 mg 1 tablet every 12 hours as needed for pain relief.  She remains on trazodone 150 mg at bedtime for insomnia.  Other fatigue: Stable.  Other medical conditions are  listed as follows:  Intermittent palpitations  Hx of migraines  History of breast cancer  History of shingles  Vitamin B12 deficiency  Primary hypertension: Blood pressure was 116/77 today in the office.  Orders: Orders Placed This Encounter  Procedures   CBC with Differential/Platelet   COMPLETE METABOLIC PANEL WITH GFR   Protein / creatinine ratio, urine   Anti-DNA antibody, double-stranded   C3 and C4   Sedimentation rate   ANA   No orders of the defined types were placed in this encounter.    Follow-Up Instructions: Return in about 4 months (around 03/30/2023) for Autoimmune Disease, Osteoarthritis.   Ofilia Neas, PA-C  Note - This record has been created using Dragon software.  Chart creation errors have been sought, but may not always  have been located. Such creation errors do not reflect on  the standard of medical care.

## 2022-11-15 NOTE — Telephone Encounter (Signed)
Pt calling, questions regarding BP; request PCP'S medical assistant call. Pt phone [phone]

## 2022-11-16 NOTE — Telephone Encounter (Signed)
Patient wants to know if this is in addition to her other medications.  Please advise

## 2022-11-16 NOTE — Telephone Encounter (Signed)
Dr. Quay Burow responded to patient via my-chart.

## 2022-11-28 ENCOUNTER — Encounter: Payer: Self-pay | Admitting: Physician Assistant

## 2022-11-28 ENCOUNTER — Ambulatory Visit: Payer: Medicare Other | Attending: Physician Assistant | Admitting: Physician Assistant

## 2022-11-28 VITALS — BP 116/77 | HR 62 | Resp 16 | Ht 64.0 in

## 2022-11-28 DIAGNOSIS — Z8669 Personal history of other diseases of the nervous system and sense organs: Secondary | ICD-10-CM

## 2022-11-28 DIAGNOSIS — Z8619 Personal history of other infectious and parasitic diseases: Secondary | ICD-10-CM

## 2022-11-28 DIAGNOSIS — M1611 Unilateral primary osteoarthritis, right hip: Secondary | ICD-10-CM

## 2022-11-28 DIAGNOSIS — M797 Fibromyalgia: Secondary | ICD-10-CM

## 2022-11-28 DIAGNOSIS — M19071 Primary osteoarthritis, right ankle and foot: Secondary | ICD-10-CM

## 2022-11-28 DIAGNOSIS — M25511 Pain in right shoulder: Secondary | ICD-10-CM | POA: Insufficient documentation

## 2022-11-28 DIAGNOSIS — S322XXD Fracture of coccyx, subsequent encounter for fracture with routine healing: Secondary | ICD-10-CM

## 2022-11-28 DIAGNOSIS — E559 Vitamin D deficiency, unspecified: Secondary | ICD-10-CM

## 2022-11-28 DIAGNOSIS — R5383 Other fatigue: Secondary | ICD-10-CM

## 2022-11-28 DIAGNOSIS — G8929 Other chronic pain: Secondary | ICD-10-CM

## 2022-11-28 DIAGNOSIS — Z79899 Other long term (current) drug therapy: Secondary | ICD-10-CM

## 2022-11-28 DIAGNOSIS — M359 Systemic involvement of connective tissue, unspecified: Secondary | ICD-10-CM

## 2022-11-28 DIAGNOSIS — Z853 Personal history of malignant neoplasm of breast: Secondary | ICD-10-CM

## 2022-11-28 DIAGNOSIS — M810 Age-related osteoporosis without current pathological fracture: Secondary | ICD-10-CM

## 2022-11-28 DIAGNOSIS — M19042 Primary osteoarthritis, left hand: Secondary | ICD-10-CM

## 2022-11-28 DIAGNOSIS — M25462 Effusion, left knee: Secondary | ICD-10-CM

## 2022-11-28 DIAGNOSIS — M65331 Trigger finger, right middle finger: Secondary | ICD-10-CM | POA: Diagnosis present

## 2022-11-28 DIAGNOSIS — M79671 Pain in right foot: Secondary | ICD-10-CM | POA: Diagnosis present

## 2022-11-28 DIAGNOSIS — I10 Essential (primary) hypertension: Secondary | ICD-10-CM

## 2022-11-28 DIAGNOSIS — M5136 Other intervertebral disc degeneration, lumbar region: Secondary | ICD-10-CM | POA: Insufficient documentation

## 2022-11-28 DIAGNOSIS — M19041 Primary osteoarthritis, right hand: Secondary | ICD-10-CM | POA: Diagnosis present

## 2022-11-28 DIAGNOSIS — M51369 Other intervertebral disc degeneration, lumbar region without mention of lumbar back pain or lower extremity pain: Secondary | ICD-10-CM

## 2022-11-28 DIAGNOSIS — R002 Palpitations: Secondary | ICD-10-CM | POA: Diagnosis present

## 2022-11-28 DIAGNOSIS — E538 Deficiency of other specified B group vitamins: Secondary | ICD-10-CM | POA: Diagnosis present

## 2022-11-28 DIAGNOSIS — M19072 Primary osteoarthritis, left ankle and foot: Secondary | ICD-10-CM | POA: Diagnosis present

## 2022-12-20 ENCOUNTER — Other Ambulatory Visit: Payer: Self-pay | Admitting: *Deleted

## 2022-12-20 MED ORDER — HYDROXYCHLOROQUINE SULFATE 200 MG PO TABS: ORAL_TABLET | ORAL | 0 refills | Status: DC

## 2022-12-20 NOTE — Telephone Encounter (Signed)
Refill request received via fax from [facility] for [name]   Next Visit: 04/04/2022  Last Visit: 11/28/2022  Labs: 09/22/2022 CBC normal, CMP normal except creatinine is elevated, sed rate normal, complements normal, double-stranded DNA negative, urine protein negative.   Eye exam: 11/18/2022 WNL    Current Dose per office note 11/28/2022: Hydroxychloroquine 200 mg 1 tablet by mouth twice daily Monday to Friday.   NA:[id number] disease   Last Fill: 09/20/2022  Okay to refill Plaquenil?

## 2023-01-04 ENCOUNTER — Other Ambulatory Visit: Payer: Self-pay | Admitting: Internal Medicine

## 2023-02-07 ENCOUNTER — Encounter: Payer: Self-pay | Admitting: Rheumatology

## 2023-02-07 ENCOUNTER — Telehealth: Payer: Self-pay | Admitting: Internal Medicine

## 2023-02-07 MED ORDER — NIRMATRELVIR/RITONAVIR (PAXLOVID) TABLET (RENAL DOSING): 2.0000 | ORAL_TABLET | Freq: Two times a day (BID) | ORAL | 0 refills | Status: AC

## 2023-02-07 NOTE — Telephone Encounter (Signed)
Pt called stated that she tested positive for covid SXS started Sunday afternoon. Pt has a low grade fever, aching body, coughing, congestion in head and chest, and scratchy throat.  Pt would like something prescribe to her and a call back from nurse

## 2023-02-07 NOTE — Telephone Encounter (Signed)
Spoke with patient today and info given. 

## 2023-02-07 NOTE — Telephone Encounter (Signed)
Paxlovid sent to pharmacy.  While she is taking this medication she needs to ideally not take the tramadol or her Relpax.  She should decrease her trazodone dose to 100 mg and can increase it back to 150 after finishing the Paxlovid.

## 2023-02-17 DIAGNOSIS — U071 COVID-19: Secondary | ICD-10-CM

## 2023-02-17 HISTORY — DX: COVID-19: U07.1

## 2023-02-20 ENCOUNTER — Other Ambulatory Visit: Payer: Self-pay | Admitting: Internal Medicine

## 2023-02-20 ENCOUNTER — Other Ambulatory Visit: Payer: Self-pay

## 2023-03-05 ENCOUNTER — Other Ambulatory Visit: Payer: Self-pay | Admitting: Internal Medicine

## 2023-03-09 ENCOUNTER — Encounter: Payer: Self-pay | Admitting: Internal Medicine

## 2023-03-09 NOTE — Patient Instructions (Addendum)
      Blood work was ordered.   The lab is on the first floor.    Medications changes include :   none      Return in about 6 months (around 09/10/2023) for follow up.

## 2023-03-09 NOTE — Progress Notes (Signed)
Subjective:    Patient ID: [name], female    DOB: Jun 05, 1946, 77 y.o.   MRN: [id number]     HPI [name] is here for follow up of her chronic medical problems.  Repeat epidural - did not help as much as first.  Doing dry needling.  Saw ortho - wants to do surgery.  She does not want to have surgery.  Doing some walking.  Doing exercise class at [place] and country club.    Intense pain across chest up to right ear - intense - lasts 15-20 min  Medications and allergies reviewed with patient and updated if appropriate.  Current Outpatient Medications on File Prior to Visit  Medication Sig Dispense Refill   acetaminophen (TYLENOL) 500 MG tablet Take 1,000 mg by mouth daily as needed for mild pain.     amLODipine (NORVASC) 5 MG tablet      CALCIUM PO Take by mouth daily.     cholecalciferol (VITAMIN D3) 25 MCG (1000 UNIT) tablet Take 1,000 Units by mouth daily.     CRANBERRY PO Take by mouth.     Cyanocobalamin (B-12 PO) Take by mouth.     eletriptan (RELPAX) 20 MG tablet TAKE 1 TABLET BY MOUTH EVERY 2 HOURS AS NEEDED FOR HEADACHE OR MIGRAINE. MAY REPEAT IN 2 HOURS IF HEADACHE PERSISTS OR RECURS. 9 tablet 1   hydroxychloroquine (PLAQUENIL) 200 MG tablet TAKE 1 TABLET BY MOUTH TWICE DAILY MONDAY THROUGH FRIDAY 120 tablet 0   losartan (COZAAR) 25 MG tablet TAKE 1 TABLET(25 MG) BY MOUTH TWICE DAILY 60 tablet 5   nebivolol (BYSTOLIC) 2.5 MG tablet TAKE 1 TABLET(2.5 MG) BY MOUTH DAILY 90 tablet 3   Probiotic Product (PROBIOTIC DAILY PO) Take by mouth.     traMADol (ULTRAM) 50 MG tablet Take 1 tablet (50 mg total) by mouth every 12 (twelve) hours as needed. 60 tablet 0   traZODone (DESYREL) 50 MG tablet TAKE 3 TABLETS(150 MG) BY MOUTH AT BEDTIME 270 tablet 1   No current facility-administered medications on file prior to visit.     Review of Systems  Constitutional:  Negative for fever.  Respiratory:  Negative for cough, shortness of breath and wheezing.    Cardiovascular:  Positive for chest pain (a few episodes). Negative for palpitations and leg swelling.  Neurological:  Negative for light-headedness and headaches.       Objective:   Vitals:   03/10/23 1306  BP: 110/78  Pulse: 65  Temp: 98 F (36.7 C)  SpO2: 96%   BP Readings from Last 3 Encounters:  03/10/23 110/78  11/28/22 116/77  09/09/22 (!) 140/92   Wt Readings from Last 3 Encounters:  06/28/22 130 lb (59 kg)  12/06/21 130 lb (59 kg)  10/27/21 128 lb (58.1 kg)   Body mass index is 22.31 kg/m.    Physical Exam Constitutional:      General: She is not in acute distress.    Appearance: Normal appearance.  HENT:     Head: Normocephalic and atraumatic.  Eyes:     Conjunctiva/sclera: Conjunctivae normal.  Cardiovascular:     Rate and Rhythm: Normal rate and regular rhythm.     Heart sounds: Normal heart sounds.  Pulmonary:     Effort: Pulmonary effort is normal. No respiratory distress.     Breath sounds: Normal breath sounds. No wheezing.  Musculoskeletal:     Cervical back: Neck supple.     Right lower leg: No edema.  Left lower leg: No edema.  Lymphadenopathy:     Cervical: No cervical adenopathy.  Skin:    General: Skin is warm and dry.     Findings: No rash.  Neurological:     Mental Status: She is alert. Mental status is at baseline.  Psychiatric:        Mood and Affect: Mood normal.        Behavior: Behavior normal.        Lab Results  Component Value Date   WBC 4.9 09/22/2022   HGB 12.5 09/22/2022   HCT 37.0 09/22/2022   PLT 282 09/22/2022   GLUCOSE 85 09/22/2022   CHOL 218 (H) 04/11/2022   TRIG 110.0 04/11/2022   HDL 67.70 04/11/2022   LDLDIRECT 122.6 06/03/2013   LDLCALC 128 (H) 04/11/2022   ALT 17 09/22/2022   AST 18 09/22/2022   NA 139 09/22/2022   K 4.8 09/22/2022   CL 105 09/22/2022   CREATININE 1.25 (H) 09/22/2022   BUN 24 09/22/2022   CO2 26 09/22/2022   TSH 3.67 04/11/2022   HGBA1C 5.2 03/06/2020      Assessment & Plan:    See Problem List for Assessment and Plan of chronic medical problems.

## 2023-03-10 ENCOUNTER — Ambulatory Visit (INDEPENDENT_AMBULATORY_CARE_PROVIDER_SITE_OTHER): Payer: Medicare Other | Admitting: Internal Medicine

## 2023-03-10 VITALS — BP 110/78 | HR 65 | Temp 98.0°F | Ht 64.0 in

## 2023-03-10 DIAGNOSIS — G43909 Migraine, unspecified, not intractable, without status migrainosus: Secondary | ICD-10-CM

## 2023-03-10 DIAGNOSIS — I10 Essential (primary) hypertension: Secondary | ICD-10-CM

## 2023-03-10 DIAGNOSIS — E782 Mixed hyperlipidemia: Secondary | ICD-10-CM | POA: Diagnosis not present

## 2023-03-10 DIAGNOSIS — R002 Palpitations: Secondary | ICD-10-CM

## 2023-03-10 LAB — CBC WITH DIFFERENTIAL/PLATELET
Basophils Absolute: 0 10*3/uL (ref 0.0–0.1)
Basophils Relative: 0.8 % (ref 0.0–3.0)
Eosinophils Absolute: 0.1 10*3/uL (ref 0.0–0.7)
Eosinophils Relative: 2 % (ref 0.0–5.0)
HCT: 38.4 % (ref 36.0–46.0)
Hemoglobin: 12.9 g/dL (ref 12.0–15.0)
Lymphocytes Relative: 26.3 % (ref 12.0–46.0)
Lymphs Abs: 1.3 10*3/uL (ref 0.7–4.0)
MCHC: 33.6 g/dL (ref 30.0–36.0)
MCV: 92.9 fl (ref 78.0–100.0)
Monocytes Absolute: 0.4 10*3/uL (ref 0.1–1.0)
Monocytes Relative: 8.8 % (ref 3.0–12.0)
Neutro Abs: 3.1 10*3/uL (ref 1.4–7.7)
Neutrophils Relative %: 62.1 % (ref 43.0–77.0)
Platelets: 271 10*3/uL (ref 150.0–400.0)
RBC: 4.14 Mil/uL (ref 3.87–5.11)
RDW: 13.6 % (ref 11.5–15.5)
WBC: 5 10*3/uL (ref 4.0–10.5)

## 2023-03-10 LAB — LIPID PANEL
Cholesterol: 225 mg/dL — ABNORMAL HIGH (ref 0–200)
HDL: 76.9 mg/dL (ref >39.00)
LDL Cholesterol: 126 mg/dL — ABNORMAL HIGH (ref 0–99)
NonHDL: 148.16
Total CHOL/HDL Ratio: 3
Triglycerides: 110 mg/dL (ref 0.0–149.0)
VLDL: 22 mg/dL (ref 0.0–40.0)

## 2023-03-10 LAB — COMPREHENSIVE METABOLIC PANEL
ALT: 16 U/L (ref 0–35)
AST: 19 U/L (ref 0–37)
Albumin: 4.7 g/dL (ref 3.5–5.2)
Alkaline Phosphatase: 65 U/L (ref 39–117)
BUN: 23 mg/dL (ref 6–23)
CO2: 26 mEq/L (ref 19–32)
Calcium: 9.7 mg/dL (ref 8.4–10.5)
Chloride: 104 mEq/L (ref 96–112)
Creatinine, Ser: 1.28 mg/dL — ABNORMAL HIGH (ref 0.40–1.20)
GFR: 40.83 mL/min — ABNORMAL LOW (ref >60.00)
Glucose, Bld: 92 mg/dL (ref 70–99)
Potassium: 4.9 mEq/L (ref 3.5–5.1)
Sodium: 140 mEq/L (ref 135–145)
Total Bilirubin: 0.5 mg/dL (ref 0.2–1.2)
Total Protein: 7.4 g/dL (ref 6.0–8.3)

## 2023-03-10 LAB — TSH: TSH: 2.29 u[IU]/mL (ref 0.35–5.50)

## 2023-03-10 NOTE — Assessment & Plan Note (Signed)
Chronic Rarely has migraines Has Relpax 20 mg that she can take if needed

## 2023-03-10 NOTE — Assessment & Plan Note (Addendum)
Chronic Blood pressure well controlled CMP, CBC, TSH Continue amlodipine 2.5 mg twice daily, losartan 25 mg twice daily, Bystolic 2.5 mg daily

## 2023-03-10 NOTE — Assessment & Plan Note (Signed)
Occasional palpitations Check TSH

## 2023-03-10 NOTE — Assessment & Plan Note (Signed)
Chronic Cholesterol looks good - likely low risk Elevated ascvd risk, but coronary calcium score is 0 Would like to avoid medication Continue healthy diet, exercise Continue lifestyle control

## 2023-03-13 ENCOUNTER — Other Ambulatory Visit: Payer: Self-pay | Admitting: Physician Assistant

## 2023-03-13 NOTE — Telephone Encounter (Signed)
Last Fill: 12/20/2022  Eye exam: 11/18/2022   Labs: 03/10/2023 Cholesterol 225, Creatinine 1.28, GFR 40.83, LDL 126  Next Visit: 04/05/2023  Last Visit: 11/28/2022  [id number] disease   Current Dose per office note 11/28/2022:  Hydroxychloroquine 200 mg 1 tablet by mouth twice daily Monday to Friday.   Okay to refill Plaquenil?

## 2023-03-22 NOTE — Progress Notes (Signed)
Office Visit Note  Patient: [name]             Date of Birth: 09-Nov-1946           MRN: [id number]             PCP: Pincus Sanes, MD Referring: Pincus Sanes, MD Visit Date: 04/05/2023 Occupation: @GUAROCC @  Subjective:  Pain and multiple joints  History of Present Illness: [name] is a 77 y.o. female with history of autoimmune disease and osteoarthritis.  She continues to have Raynaud's phenomenon which is manageable.  She has been having pain and discomfort in her right shoulder joint.  She states the pain has been worse in the last 6 months.  She was evaluated by Dr. Lawerance Bach and had x-rays of her shoulder joint which showed mild degenerative changes.   She continues to have discomfort in her bilateral hands, right hip, left knee, feet and her lower back.  She has not noticed any joint swelling.  Has been going for dry needling which has been helpful.  She has been taking hydroxychloroquine 200 mg p.o. twice daily Monday to Friday without any interruption since the last visit.  She continues to have generalized pain from fibromyalgia intermittently.  She takes tramadol 1 tablet twice a day as needed and also for insomnia trazodone 150 mg p.o. nightly as needed    Activities of Daily Living:  Patient reports morning stiffness for all day. Patient Denies nocturnal pain.  Difficulty dressing/grooming: Reports Difficulty climbing stairs: Reports Difficulty getting out of chair: Denies Difficulty using hands for taps, buttons, cutlery, and/or writing: Reports  Review of Systems  Constitutional:  Positive for fatigue.  HENT:  Positive for mouth dryness. Negative for mouth sores.   Eyes:  Positive for dryness.  Respiratory:  Negative for shortness of breath.   Cardiovascular:  Positive for palpitations. Negative for chest pain.  Gastrointestinal:  Negative for blood in stool, constipation and diarrhea.  Endocrine: Negative for increased urination.  Genitourinary:   Negative for involuntary urination.  Musculoskeletal:  Positive for joint pain, gait problem, joint pain, joint swelling, myalgias, muscle weakness, morning stiffness, muscle tenderness and myalgias.  Skin:  Negative for color change, rash, hair loss and sensitivity to sunlight.  Allergic/Immunologic: Negative for susceptible to infections.  Neurological:  Negative for dizziness and headaches.  Hematological:  Negative for swollen glands.  Psychiatric/Behavioral:  Negative for depressed mood and sleep disturbance. The patient is not nervous/anxious.     PMFS History:  Patient Active Problem List   Diagnosis Date Noted   Chronic right shoulder pain 03/29/2023   Lumbar radiculopathy 09/09/2022   Acute UTI 08/09/2022   Agatston coronary artery calcium score of 0,  2023 07/28/2022   Hypertension 07/05/2022   Renal lesion 04/14/2022   Coccyx pain 05/19/2021   B12 deficiency 04/15/2021   COVID-19 04/14/2021   Autoimmune disorder 04/13/2021   At risk for long QT syndrome 01/21/2021   Lumbar back pain with radiculopathy affecting left lower extremity 08/05/2020   Other chest pain 03/10/2020   Shingles 11/19/2019   Pain in both knees 07/09/2019   Right arm pain 02/26/2019   Acute pain of left knee 08/27/2018   Hand arthritis 06/13/2018   Trigger middle finger of right hand 06/13/2018   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   Intermittent palpitations 10/11/2017   Migraine 12/30/2015   Upper airway cough syndrome 10/29/2015   Osteoporosis 06/06/2011   Vitamin D deficiency 03/22/2010   BREAST  CANCER, HX OF 03/22/2010   SPINAL STENOSIS, LUMBAR 03/25/2008   HYPERLIPIDEMIA 09/28/2007   Fibromyalgia 05/24/2007    Past Medical History:  Diagnosis Date   Arthritis    Breast cancer    R lumpectomy ; radiation; no chemotherapy   Bronchitis    recurrent   Bulging lumbar disc    Cataract    multifocal lens implant   Chronic low back pain    bulging disc & spinal stenosis   Coccygeal  fracture 05/15/2021   COVID-19    COVID-19 02/2023   Cystitis    Fibromyalgia    Hyperlipemia    Mitral valve prolapse    Osteoporosis    Personal history of radiation therapy    Pneumonia    as OP X 2; no Pneumovax   Vitamin D deficiency    19 in  01/2009    Family History  Problem Relation Age of Onset   Atrial fibrillation Mother        S/P cardioversion   Ulcers Mother    Hypertension Mother    Dementia Mother    Heart attack Father 34       died @ 44   Hypertension Brother    Diabetes Brother    Atrial fibrillation Brother    Heart attack Paternal Aunt        < 65   Heart attack Paternal Uncle        > 55   Heart attack Paternal Grandmother        < 65   Heart attack Paternal Grandfather        > 55   Thyroid disease Daughter        on Synthroid since high school   Healthy Son    Stroke Neg Hx    Breast cancer Neg Hx    Past Surgical History:  Procedure Laterality Date   ABDOMINAL HYSTERECTOMY  1990   [facility]O for fibroid , Dr Laureen Ochs   BREAST LUMPECTOMY Right    BREAST SURGERY  2010   R side lumpectomy; Dr Jamey Ripa   CATARACT EXTRACTION, BILATERAL  2017   CESAREAN SECTION      G 2 P 2   CYSTECTOMY  2010   EYE SURGERY     eye lid surgery   FOOT SURGERY Right    no colonoscopy     "I just don't want to do it". SOC reviewed   Social History   Social History Narrative   Not on file   Immunization History  Administered Date(s) Administered   Fluad Quad(high Dose 65+) 10/02/2019, 09/09/2022   PFIZER(Purple Top)SARS-COV-2 Vaccination 01/08/2020, 01/27/2020, 10/17/2020   Td 12/20/1999     Objective: Vital Signs: BP 130/84 (BP Location: Left Arm, Patient Position: Sitting, Cuff Size: Normal)   Pulse (!) 57   Resp 15   Ht 5\' 4"  (1.626 m)   Wt 138 lb (62.6 kg) Comment: per patient  BMI 23.69 kg/m    Physical Exam Vitals and nursing note reviewed.  Constitutional:      Appearance: She is well-developed.  HENT:     Head: Normocephalic and  atraumatic.  Eyes:     Conjunctiva/sclera: Conjunctivae normal.  Cardiovascular:     Rate and Rhythm: Normal rate and regular rhythm.     Heart sounds: Normal heart sounds.  Pulmonary:     Effort: Pulmonary effort is normal.     Breath sounds: Normal breath sounds.  Abdominal:     General: Bowel sounds are  normal.     Palpations: Abdomen is soft.  Musculoskeletal:     Cervical back: Normal range of motion.  Lymphadenopathy:     Cervical: No cervical adenopathy.  Skin:    General: Skin is warm and dry.     Capillary Refill: Capillary refill takes less than 2 seconds.  Neurological:     Mental Status: She is alert and oriented to person, place, and time.  Psychiatric:        Behavior: Behavior normal.      Musculoskeletal Exam: She had limited lateral rotation.  There was no tenderness over the cervical, thoracic or lumbar spine.  She had discomfort with range of motion of right shoulder joint on abduction and internal rotation.  Left shoulder joint was in full range of motion.  Elbow joints, wrist joints were in good range of motion.  She has synovitis over right second MCP joint.  She had bilateral PIP and DIP tenderness.  Hip joints and knee joints in good range of motion.  She had warmth and swelling in her left knee joint.  There was no tenderness over ankles or MTPs.  CDAI Exam: CDAI Score: 5.2  Patient Global: 7 mm; Provider Global: 5 mm Swollen: 2 ; Tender: 3  Joint Exam 04/05/2023      Right  Left  Acromioclavicular   Tender     MCP 2  Swollen Tender     Knee     Swollen Tender     Investigation: No additional findings.  Imaging: DG Cervical Spine Complete  Result Date: 03/29/2023 CLINICAL DATA:  Pain EXAM: CERVICAL SPINE - COMPLETE 4+ VIEW COMPARISON:  None Available. FINDINGS: No malalignment. The pre odontoid space and prevertebral soft tissues are normal. Multilevel degenerative disc disease with anterior osteophytes. Moderate posterior osteophytes at C6-7,  project over the right neural foramen. The left neural foramina are widely patent. Uncovertebral degenerative changes are noted. Vascular calcifications are identified in the soft tissues of the neck. No other abnormalities. IMPRESSION: 1. Degenerative changes as above. Posterior osteophytes at C6-7 project over the right neural foramen on oblique views. Patency of this neural foramen could be better assessed with an MRI if clinically warranted. 2. Vascular calcifications in the soft tissues of the neck. Electronically Signed   By: Gerome Sam III M.D.   On: 03/29/2023 17:55   DG Shoulder Right  Result Date: 03/29/2023 CLINICAL DATA:  Pain EXAM: RIGHT SHOULDER - 2+ VIEW COMPARISON:  None Available. FINDINGS: Mild glenohumeral degenerative change. No fracture or dislocation. No bony lesion. No other abnormalities. IMPRESSION: Mild glenohumeral degenerative change. Electronically Signed   By: Gerome Sam III M.D.   On: 03/29/2023 17:53    Recent Labs: Lab Results  Component Value Date   WBC 5.0 03/10/2023   HGB 12.9 03/10/2023   PLT 271.0 03/10/2023   NA 140 03/10/2023   K 4.9 03/10/2023   CL 104 03/10/2023   CO2 26 03/10/2023   GLUCOSE 92 03/10/2023   BUN 23 03/10/2023   CREATININE 1.28 (H) 03/10/2023   BILITOT 0.5 03/10/2023   ALKPHOS 65 03/10/2023   AST 19 03/10/2023   ALT 16 03/10/2023   PROT 7.4 03/10/2023   ALBUMIN 4.7 03/10/2023   CALCIUM 9.7 03/10/2023   GFRAA 58 (L) 05/24/2021   QFTBGOLDPLUS NEGATIVE 10/27/2021   October 27, 2021 TB Gold negative, immunoglobulins normal IgM 46, hepatitis B-, hepatitis C negative June 30, 2021 IFE negative  Speciality Comments: [facility] Exam: 11/18/2022 WNL @ [facility] Follow up in 6 months.  Procedures:  No procedures performed Allergies: Morphine, Albuterol, Augmentin [amoxicillin-pot clavulanate], Ciprofloxacin, Fish allergy, Gabapentin, Risedronate sodium, Cyclobenzaprine, and Ibandronate sodium   Assessment / Plan:      Visit Diagnoses: Autoimmune disease - Positive ANA, dry eyes, hair loss, Raynaud's, inflammatory arthritis: She continues to have pain and inflammation in multiple joints.  She has synovitis in her right second MCP joint and left knee joint.  She has been also having discomfort in her right shoulder joint.  Recent x-rays were reviewed which showed degenerative changes.  She her symptoms were consistent with right subacromial bursitis.  She also had a stiffness in her cervical spine.  She continues to have chronic pain and inflammation.  She had left knee joint aspiration and injection in the past which did not last for long time.  I had a detailed discussion regarding different treatment options and their side effects.  She has elevated creatinine.  I discussed the option of Imuran 50 mg p.o. daily.  Indications side effects contraindications were discussed at length.  I will obtain TPMT today.  If TPMT is normal we will start her on the medication.  I will check labs in 2 weeks, 2 months and then every 3 months.  Patient was in agreement and wanted to proceed with the medication.  Baseline Immunosuppressant Therapy Labs TB GOLD    Latest Ref Rng & Units 10/27/2021   11:35 AM  Quantiferon TB Gold  Quantiferon TB Gold Plus NEGATIVE NEGATIVE    Hepatitis Panel    Latest Ref Rng & Units 10/27/2021   11:35 AM  Hepatitis  Hep B Surface Ag NON-REACTIVE NON-REACTIVE   Hep B IgM NON-REACTIVE NON-REACTIVE   Hep C Ab NON-REACTIVE NON-REACTIVE    HIV No results found for: "HIV" Immunoglobulins    Latest Ref Rng & Units 10/27/2021   11:35 AM  Immunoglobulin Electrophoresis  IgA  70 - 320 mg/dL 161   IgG 096 - 0,454 mg/dL 098   IgM 50 - 119 mg/dL 46    SPEP    Latest Ref Rng & Units 03/10/2023    1:52 PM  Serum Protein Electrophoresis  Total Protein 6.0 - 8.3 g/dL 7.4    J4NW Lab Results  Component Value Date   G6PDH 17.9 01/19/2021   TPMT No results found for: "TPMT"   Chest Xray:  March 04, 2020  Patient was counseled on the purpose, proper use, and adverse effects of azathioprine including risk of infection, nausea, rash, and hair loss.  Also informed that medication can cause discoloration of urine, sweat and tears. Reviewed risk of cancer after long term use.  Discussed risk of myelosupression and reviewed importance of frequent lab work to monitor blood counts.  Standing orders placed. Reviewed drug-drug interactions including contraindication with allopurinol.  Provided patient with educational materials on azathioprine and answered all questions.  Patient consented to azathioprine.  Will upload consent into the media tab.   Patient dose will be Imuran 50 mg p.o. daily.  Prescription will be sent to pharmacy pending lab results and insurance approval.   High risk medication use - Hydroxychloroquine 200 mg p.o. twice daily Monday to Friday.  Eye examination November 18, 2022 at [facility] - Plan: Thiopurine methyltransferase(tpmt)rbc.  She was advised to get labs in 2 weeks after starting Imuran than 2 months and then every 3 months.  Chronic right shoulder pain-she has been having discomfort in right shoulder joint.  X-rays of  the right shoulder joint obtained on March 29, 2023 were reviewed which showed mild glenohumeral degenerative changes.  She had tenderness with abduction and internal rotation.  I offered cortisone injection which she declined.  Primary osteoarthritis of both hands-she has bilateral PIP and DIP thickening.  She also had synovitis of her right second MCP joint.  Trigger middle finger of right hand-she has intermittent discomfort.  Primary osteoarthritis of right hip-she is to have pain and discomfort in her right hip joint.  She states it is getting difficult for her to getting out of the car.  Effusion, left knee-small effusion was noted in the left knee joint.  Her left knee joint continues to bother her.  Primary osteoarthritis of  both feet-she has intermittent discomfort.  DDD (degenerative disc disease), cervical-she has been being in pain and stiffness.  Recent x-rays from March 29, 2023 showed degenerative changes and posterior osteophytes at C6- C7.  Patient has seen Dr. Yevette Edwards in the past.  Advised to follow-up appointment with them.  DDD (degenerative disc disease), lumbar-chronic pain.  Age-related osteoporosis without current pathological fracture - 04/23/21 DEXA scan showed left femoral neck T score -2.7.  Intolerance to Fosamax and Boniva in the past.  At this point she does not want to take any medications.  Use of calcium rich diet and vitamin D was discussed.  Vitamin D deficiency  Closed fracture of coccyx with routine healing, subsequent encounter  Fibromyalgia -she continues to have generalized pain and discomfort from fibromyalgia.  She is currently on she is on tramadol 50 mg p.o. twice daily as needed and trazodone 100 mg p.o. nightly as needed insomnia.  Other fatigue  History of breast cancer  Primary hypertension  Vitamin B12 deficiency  Hx of migraines  History of shingles  Orders: Orders Placed This Encounter  Procedures   Thiopurine methyltransferase(tpmt)rbc   No orders of the defined types were placed in this encounter.   Follow-Up Instructions: Return in about 2 months (around 06/05/2023) for Autoimmune disease.   Pollyann Savoy, MD  Note - This record has been created using Animal nutritionist.  Chart creation errors have been sought, but may not always  have been located. Such creation errors do not reflect on  the standard of medical care.

## 2023-03-29 ENCOUNTER — Encounter: Payer: Self-pay | Admitting: Internal Medicine

## 2023-03-29 ENCOUNTER — Ambulatory Visit (INDEPENDENT_AMBULATORY_CARE_PROVIDER_SITE_OTHER): Payer: Medicare Other

## 2023-03-29 ENCOUNTER — Ambulatory Visit (INDEPENDENT_AMBULATORY_CARE_PROVIDER_SITE_OTHER): Payer: Medicare Other | Admitting: Internal Medicine

## 2023-03-29 VITALS — BP 116/80 | HR 59 | Temp 98.1°F | Ht 64.0 in

## 2023-03-29 DIAGNOSIS — M25511 Pain in right shoulder: Secondary | ICD-10-CM

## 2023-03-29 DIAGNOSIS — M79601 Pain in right arm: Secondary | ICD-10-CM | POA: Diagnosis not present

## 2023-03-29 DIAGNOSIS — G8929 Other chronic pain: Secondary | ICD-10-CM

## 2023-03-29 DIAGNOSIS — I10 Essential (primary) hypertension: Secondary | ICD-10-CM | POA: Diagnosis not present

## 2023-03-29 MED ORDER — TRAMADOL HCL 50 MG PO TABS: 50.0000 mg | ORAL_TABLET | Freq: Two times a day (BID) | ORAL | 0 refills | Status: DC | PRN

## 2023-03-29 NOTE — Assessment & Plan Note (Signed)
Chronic Blood pressure well controlled Continue amlodipine 2.5 mg twice daily, losartan 25 mg twice daily, Bystolic 2.5 mg daily

## 2023-03-29 NOTE — Assessment & Plan Note (Signed)
Chronic Getting worse Associated with pain with movement, swelling in right upper arm and pain from right side of neck to pinky Has components of shoulder pain and cervical radiculopathy Will start with xrays of shoulder, neck Continue tramadol prn for pain - refilled today Sees Dr [name] next week ? Need MRI of c spine' ? Need further imaging of right upper arm

## 2023-03-29 NOTE — Patient Instructions (Addendum)
     Have xrays downstairs.    Medications changes include :   none     Return if symptoms worsen or fail to improve.  

## 2023-03-29 NOTE — Progress Notes (Signed)
Subjective:    Patient ID: [name], female    DOB: [birth year], 77 y.o.   MRN: [id number]      HPI Odia is here for  Chief Complaint  Patient presents with   Shoulder Pain    Right shoulder pain     Right shoulder pain x months.  It has gotten worse.  The initial pain was in the shoulder area.  The area of pain has expanded from the neck to the pinky.  The upper back and upper arm/shoulder area is very tender and looks swollen,  Aspercrem helps. Tylenol takes th edge off.    She denies N/T in the arm and weakness in the hand.  Moving her shoulder increases the pain.       Medications and allergies reviewed with patient and updated if appropriate.  Current Outpatient Medications on File Prior to Visit  Medication Sig Dispense Refill   acetaminophen (TYLENOL) 500 MG tablet Take 1,000 mg by mouth daily as needed for mild pain.     amLODipine (NORVASC) 5 MG tablet Take 2.5 mg by mouth 2 (two) times daily.     CALCIUM PO Take by mouth daily.     cholecalciferol (VITAMIN D3) 25 MCG (1000 UNIT) tablet Take 1,000 Units by mouth daily.     CRANBERRY PO Take by mouth.     Cyanocobalamin (B-12 PO) Take by mouth.     eletriptan (RELPAX) 20 MG tablet TAKE 1 TABLET BY MOUTH EVERY 2 HOURS AS NEEDED FOR HEADACHE OR MIGRAINE. MAY REPEAT IN 2 HOURS IF HEADACHE PERSISTS OR RECURS. 9 tablet 1   hydroxychloroquine (PLAQUENIL) 200 MG tablet TAKE 1 TABLET BY MOUTH TWICE DAILY, MONDAY THROUGH FRIDAY 120 tablet 0   losartan (COZAAR) 25 MG tablet TAKE 1 TABLET(25 MG) BY MOUTH TWICE DAILY 60 tablet 5   nebivolol (BYSTOLIC) 2.5 MG tablet TAKE 1 TABLET(2.5 MG) BY MOUTH DAILY 90 tablet 3   Probiotic Product (PROBIOTIC DAILY PO) Take by mouth.     traMADol (ULTRAM) 50 MG tablet Take 1 tablet (50 mg total) by mouth every 12 (twelve) hours as needed. 60 tablet 0   traZODone (DESYREL) 50 MG tablet TAKE 3 TABLETS(150 MG) BY MOUTH AT BEDTIME 270 tablet 1   No current facility-administered  medications on file prior to visit.    Review of Systems     Objective:   Vitals:   03/29/23 1117  BP: 116/80  Pulse: (!) 59  Temp: 98.1 F (36.7 C)  SpO2: 96%   BP Readings from Last 3 Encounters:  03/29/23 116/80  03/10/23 110/78  11/28/22 116/77   Wt Readings from Last 3 Encounters:  06/28/22 130 lb (59 kg)  12/06/21 130 lb (59 kg)  10/27/21 128 lb (58.1 kg)   Body mass index is 22.31 kg/m.    Physical Exam Constitutional:      General: She is not in acute distress.    Appearance: Normal appearance. She is not ill-appearing.  HENT:     Head: Normocephalic and atraumatic.  Musculoskeletal:        General: Swelling (right upper back appears slightly swollen, right upper arm appears swollen) and tenderness (upper right back - trapezius from neck to shoulder, shoulder tenderness, right upper arm tender) present.     Comments: Dec ROM of right shoulder due to pain  Skin:    General: Skin is warm and dry.     Findings: No erythema or rash.  Neurological:  Mental Status: She is alert.     Sensory: No sensory deficit.     Motor: No weakness.            Assessment & Plan:    See Problem List for Assessment and Plan of chronic medical problems.

## 2023-03-29 NOTE — Assessment & Plan Note (Signed)
Chronic Symptoms getting worse Pain from right side of neck to pinky Pain in shoulder with dec ROM due to pain, some selling in right upper back and proximal arm Has components of shoulder pain and cervical radiculopathy Will start with xrays of shoulder, neck Continue tramadol prn for pain - refilled today Sees Dr [name] next week ? Need MRI of c spine' ? Need further imaging of right upper arm

## 2023-03-30 ENCOUNTER — Encounter: Payer: Self-pay | Admitting: Internal Medicine

## 2023-04-05 ENCOUNTER — Ambulatory Visit: Payer: Medicare Other | Attending: Rheumatology | Admitting: Rheumatology

## 2023-04-05 ENCOUNTER — Other Ambulatory Visit: Payer: Self-pay | Admitting: *Deleted

## 2023-04-05 ENCOUNTER — Encounter: Payer: Self-pay | Admitting: Rheumatology

## 2023-04-05 VITALS — BP 130/84 | HR 57 | Resp 15 | Ht 64.0 in | Wt 138.0 lb

## 2023-04-05 DIAGNOSIS — Z853 Personal history of malignant neoplasm of breast: Secondary | ICD-10-CM | POA: Insufficient documentation

## 2023-04-05 DIAGNOSIS — M19071 Primary osteoarthritis, right ankle and foot: Secondary | ICD-10-CM | POA: Insufficient documentation

## 2023-04-05 DIAGNOSIS — M65331 Trigger finger, right middle finger: Secondary | ICD-10-CM | POA: Diagnosis present

## 2023-04-05 DIAGNOSIS — M359 Systemic involvement of connective tissue, unspecified: Secondary | ICD-10-CM | POA: Insufficient documentation

## 2023-04-05 DIAGNOSIS — E559 Vitamin D deficiency, unspecified: Secondary | ICD-10-CM | POA: Insufficient documentation

## 2023-04-05 DIAGNOSIS — M797 Fibromyalgia: Secondary | ICD-10-CM | POA: Insufficient documentation

## 2023-04-05 DIAGNOSIS — M19042 Primary osteoarthritis, left hand: Secondary | ICD-10-CM | POA: Diagnosis present

## 2023-04-05 DIAGNOSIS — M25462 Effusion, left knee: Secondary | ICD-10-CM | POA: Insufficient documentation

## 2023-04-05 DIAGNOSIS — R5383 Other fatigue: Secondary | ICD-10-CM | POA: Insufficient documentation

## 2023-04-05 DIAGNOSIS — M810 Age-related osteoporosis without current pathological fracture: Secondary | ICD-10-CM | POA: Diagnosis present

## 2023-04-05 DIAGNOSIS — M1611 Unilateral primary osteoarthritis, right hip: Secondary | ICD-10-CM | POA: Insufficient documentation

## 2023-04-05 DIAGNOSIS — M5136 Other intervertebral disc degeneration, lumbar region: Secondary | ICD-10-CM | POA: Diagnosis present

## 2023-04-05 DIAGNOSIS — M25511 Pain in right shoulder: Secondary | ICD-10-CM | POA: Insufficient documentation

## 2023-04-05 DIAGNOSIS — M19072 Primary osteoarthritis, left ankle and foot: Secondary | ICD-10-CM | POA: Diagnosis present

## 2023-04-05 DIAGNOSIS — S322XXD Fracture of coccyx, subsequent encounter for fracture with routine healing: Secondary | ICD-10-CM | POA: Diagnosis present

## 2023-04-05 DIAGNOSIS — I10 Essential (primary) hypertension: Secondary | ICD-10-CM | POA: Insufficient documentation

## 2023-04-05 DIAGNOSIS — Z8619 Personal history of other infectious and parasitic diseases: Secondary | ICD-10-CM | POA: Diagnosis present

## 2023-04-05 DIAGNOSIS — M19041 Primary osteoarthritis, right hand: Secondary | ICD-10-CM | POA: Diagnosis present

## 2023-04-05 DIAGNOSIS — E538 Deficiency of other specified B group vitamins: Secondary | ICD-10-CM | POA: Diagnosis present

## 2023-04-05 DIAGNOSIS — M5030 Other cervical disc degeneration, unspecified cervical region: Secondary | ICD-10-CM | POA: Diagnosis present

## 2023-04-05 DIAGNOSIS — Z8669 Personal history of other diseases of the nervous system and sense organs: Secondary | ICD-10-CM | POA: Diagnosis present

## 2023-04-05 DIAGNOSIS — Z79899 Other long term (current) drug therapy: Secondary | ICD-10-CM | POA: Diagnosis present

## 2023-04-05 DIAGNOSIS — G8929 Other chronic pain: Secondary | ICD-10-CM | POA: Insufficient documentation

## 2023-04-05 NOTE — Telephone Encounter (Signed)
Per [name] patient to start Imuran after TPMT results.

## 2023-04-05 NOTE — Patient Instructions (Addendum)
Azathioprine Tablets What is this medication? AZATHIOPRINE (ay za THYE oh preen) prevents the body from rejecting an organ transplant. It works by lowering the body's immune system response. This helps the body accept the donor organ. It may also be used to treat rheumatoid arthritis. This medicine may be used for other purposes; ask your health care provider or pharmacist if you have questions. COMMON BRAND NAME(S): Azasan, Imuran What should I tell my care team before I take this medication? They need to know if you have any of these conditions: Infection Kidney disease Liver disease An unusual or allergic reaction to azathioprine, lactose, other medications, foods, dyes, or preservatives Pregnant or trying to get pregnant Breastfeeding How should I use this medication? Take this medication by mouth with a full glass of water. Take it as directed on the prescription label at the same time every day. Keep taking it unless your care team tells you to stop. Keep taking it even if you think you are better. Talk to your care team about the use of this medication in children. Special care may be needed. Overdosage: If you think you have taken too much of this medicine contact a poison control center or emergency room at once. NOTE: This medicine is only for you. Do not share this medicine with others. What if I miss a dose? If you miss a dose, take it as soon as you can. If it is almost time for your next dose, take only that dose. Do not take double or extra doses. What may interact with this medication? Do not take this medication with any of the following: Febuxostat Mercaptopurine This medication may also interact with the following: Allopurinol Aminosalicylates, such as sulfasalazine, mesalamine, balsalazide, and olsalazine Leflunomide Medications called ACE inhibitors, such as benazepril, captopril, enalapril, fosinopril, quinapril, lisinopril, ramipril, and  trandolapril Mycophenolate Sulfamethoxazole; trimethoprim Vaccines Warfarin This list may not describe all possible interactions. Give your health care provider a list of all the medicines, herbs, non-prescription drugs, or dietary supplements you use. Also tell them if you smoke, drink alcohol, or use illegal drugs. Some items may interact with your medicine. What should I watch for while using this medication? Visit your care team for regular checks on your progress. You may need blood work done while you are taking this medication. This medication may increase your risk of getting an infection. Call your care team for advice if you get a fever, chills, sore throat, or other symptoms of a cold or flu. Do not treat yourself. Try to avoid being around people who are sick. Talk to your care team about your risk of cancer. You may be more at risk for certain types of cancer if you take this medication. Talk to your care team if you may be pregnant. This medication can cause serious birth defects if taken during pregnancy. This medication may cause infertility. Talk to your care team if you are concerned about your fertility. What side effects may I notice from receiving this medication? Side effects that you should report to your care team as soon as possible: Allergic reactions--skin rash, itching, hives, swelling of the face, lips, tongue, or throat Change in your skin, such as a new growth, a sore that doesn't heal, or a change in a mole Dizziness, loss of balance or coordination, confusion or trouble speaking Infection--fever, chills, cough, sore throat, wounds that don't heal, pain or trouble when passing urine, general feeling of discomfort or being unwell Low red blood cell level--unusual  weakness or fatigue, dizziness, headache, trouble breathing Unusual bruising or bleeding Side effects that usually do not require medical attention (report to your care team if they continue or are  bothersome): Diarrhea Fatigue Nausea Vomiting This list may not describe all possible side effects. Call your doctor for medical advice about side effects. You may report side effects to FDA at 1-800-FDA-1088. Where should I keep my medication? Keep out of the reach of children and pets. Store at room temperature between 15 and 25 degrees C (59 and 77 degrees F). Protect from light. Get rid of any unused medication after the expiration date. To get rid of medications that are no longer needed or have expired: Take the medication to a medication take-back program. Check with your pharmacy or law enforcement to find a location. If you cannot return the medication, check the label or package insert to see if the medication should be thrown out in the garbage or flushed down the toilet. If you are not sure, ask your care team. If it is safe to put it in the trash, empty the medication out of the container. Mix the medication with cat litter, dirt, coffee grounds, or other unwanted substance. Seal the mixture in a bag or container. Put it in the trash. NOTE: This sheet is a summary. It may not cover all possible information. If you have questions about this medicine, talk to your doctor, pharmacist, or health care provider.  2023 Elsevier/Gold Standard (2022-05-03 00:00:00)  Start Imuran 50 mg 1 tablet by mouth daily.  Please return for lab work in 2 weeks, 2 months and then every 3 months   Standing Labs We placed an order today for your standing lab work.   Please have your standing labs drawn in 2 weeks then 2 months and then every 3 months  Please have your labs drawn 2 weeks prior to your appointment so that the provider can discuss your lab results at your appointment, if possible.  Please note that you may see your imaging and lab results in MyChart before we have reviewed them. We will contact you once all results are reviewed. Please allow our office up to 72 hours to thoroughly  review all of the results before contacting the office for clarification of your results.  WALK-IN LAB HOURS  Monday through Thursday from 8:00 am -12:30 pm and 1:00 pm-5:00 pm and Friday from 8:00 am-12:00 pm.  Patients with office visits requiring labs will be seen before walk-in labs.  You may encounter longer than normal wait times. Please allow additional time. Wait times may be shorter on  Monday and Thursday afternoons.  We do not book appointments for walk-in labs. We appreciate your patience and understanding with our staff.   Labs are drawn by [facility]. Please bring your co-pay at the time of your lab draw.  You may receive a bill from [facility] for your lab work.  Please note if you are on Hydroxychloroquine and and an order has been placed for a Hydroxychloroquine level,  you will need to have it drawn 4 hours or more after your last dose.  If you wish to have your labs drawn at another location, please call the office 24 hours in advance so we can fax the orders.  The office is located at [address], [address], [place], [place] [address]   If you have any questions regarding directions or hours of operation,  please call [phone].   As a reminder, please drink plenty  of water prior to coming for your lab work. Thanks!   Vaccines You are taking a medication(s) that can suppress your immune system.  The following immunizations are recommended: Flu annually Covid-19  Td/Tdap (tetanus, diphtheria, pertussis) every 10 years Pneumonia (Prevnar 15 then Pneumovax 23 at least 1 year apart.  Alternatively, can take Prevnar 20 without needing additional dose) Shingrix: 2 doses from 4 weeks to 6 months apart  Please check with your PCP to make sure you are up to date.   If you have signs or symptoms of an infection or start antibiotics: First, call your PCP for workup of your infection. Hold your medication through the infection, until you complete your antibiotics, and until  symptoms resolve if you take the following: Injectable medication (Actemra, Benlysta, Cimzia, Cosentyx, Enbrel, Humira, Kevzara, Orencia, Remicade, Simponi, Stelara, Taltz, Tremfya) Methotrexate Leflunomide (Arava) Mycophenolate (Cellcept) Harriette Ohara, Olumiant, or Rinvoq

## 2023-04-10 ENCOUNTER — Encounter: Payer: Self-pay | Admitting: Internal Medicine

## 2023-04-11 ENCOUNTER — Telehealth: Payer: Self-pay | Admitting: Internal Medicine

## 2023-04-11 ENCOUNTER — Other Ambulatory Visit: Payer: Self-pay

## 2023-04-11 MED ORDER — AMLODIPINE BESYLATE 5 MG PO TABS: 2.5000 mg | ORAL_TABLET | Freq: Two times a day (BID) | ORAL | 2 refills | Status: DC

## 2023-04-11 NOTE — Telephone Encounter (Signed)
Pt is asking for a refill on her BP meds as she has been having elevated levels.   amLODipine (NORVASC) 5 MG tablet

## 2023-04-12 LAB — THIOPURINE METHYLTRANSFERASE (TPMT), RBC: Thiopurine Methyltransferase, RBC: 14 nmol/hr/mL RBC

## 2023-04-13 ENCOUNTER — Telehealth: Payer: Self-pay

## 2023-04-13 DIAGNOSIS — Z79899 Other long term (current) drug therapy: Secondary | ICD-10-CM

## 2023-04-13 MED ORDER — AZATHIOPRINE 50 MG PO TABS
50.0000 mg | ORAL_TABLET | Freq: Every day | ORAL | 2 refills | Status: DC
Start: 2023-04-13 — End: 2023-07-11

## 2023-04-13 NOTE — Telephone Encounter (Signed)
-----   Message from Pollyann Savoy, MD sent at 04/13/2023  9:06 AM EDT ----- TPMT is normal.  Please send a prescription for Imuran 50 mg p.o. daily.  Patient will get labs in 2 weeks, 2 months and then every 3 months.  Patient should notify us if she develops any side effects.

## 2023-04-13 NOTE — Addendum Note (Signed)
Addended by: Henriette Combs on: 04/13/2023 09:04 AM   Modules accepted: Orders

## 2023-04-13 NOTE — Telephone Encounter (Signed)
Per office note on 04/05/2023: Imuran 50 mg p.o. daily   TPMT WNL

## 2023-04-13 NOTE — Progress Notes (Signed)
TPMT is normal.  Please send a prescription for Imuran 50 mg p.o. daily.  Patient will get labs in 2 weeks, 2 months and then every 3 months.  Patient should notify us if she develops any side effects.

## 2023-04-15 ENCOUNTER — Other Ambulatory Visit: Payer: Self-pay | Admitting: Internal Medicine

## 2023-04-16 ENCOUNTER — Encounter: Payer: Self-pay | Admitting: Internal Medicine

## 2023-04-16 NOTE — Progress Notes (Unsigned)
    Subjective:    Patient ID: [name], female    DOB: November 02, 1946, 77 y.o.   MRN: [id number]      HPI [name] is here for No chief complaint on file.    BP concerns -     Medications and allergies reviewed with patient and updated if appropriate.  Current Outpatient Medications on File Prior to Visit  Medication Sig Dispense Refill   acetaminophen (TYLENOL) 500 MG tablet Take 1,000 mg by mouth daily as needed for mild pain.     amLODipine (NORVASC) 5 MG tablet Take 0.5 tablets (2.5 mg total) by mouth 2 (two) times daily. 60 tablet 2   azaTHIOprine (IMURAN) 50 MG tablet Take 1 tablet (50 mg total) by mouth daily. 30 tablet 2   CALCIUM PO Take by mouth daily.     cholecalciferol (VITAMIN D3) 25 MCG (1000 UNIT) tablet Take 1,000 Units by mouth daily.     CRANBERRY PO Take by mouth.     Cyanocobalamin (B-12 PO) Take by mouth.     eletriptan (RELPAX) 20 MG tablet TAKE 1 TABLET BY MOUTH EVERY 2 HOURS AS NEEDED FOR HEADACHE OR MIGRAINE. MAY REPEAT IN 2 HOURS IF HEADACHE PERSISTS OR RECURS. 9 tablet 1   hydroxychloroquine (PLAQUENIL) 200 MG tablet TAKE 1 TABLET BY MOUTH TWICE DAILY, MONDAY THROUGH FRIDAY 120 tablet 0   losartan (COZAAR) 25 MG tablet TAKE 1 TABLET(25 MG) BY MOUTH TWICE DAILY 60 tablet 5   nebivolol (BYSTOLIC) 2.5 MG tablet TAKE 1 TABLET(2.5 MG) BY MOUTH DAILY 90 tablet 3   Probiotic Product (PROBIOTIC DAILY PO) Take by mouth.     traMADol (ULTRAM) 50 MG tablet Take 1 tablet (50 mg total) by mouth every 12 (twelve) hours as needed. 60 tablet 0   traZODone (DESYREL) 50 MG tablet TAKE 3 TABLETS(150 MG) BY MOUTH AT BEDTIME 270 tablet 1   No current facility-administered medications on file prior to visit.    Review of Systems     Objective:  There were no vitals filed for this visit. BP Readings from Last 3 Encounters:  04/05/23 130/84  03/29/23 116/80  03/10/23 110/78   Wt Readings from Last 3 Encounters:  04/05/23 138 lb (62.6 kg)  06/28/22 130 lb  (59 kg)  12/06/21 130 lb (59 kg)   There is no height or weight on file to calculate BMI.    Physical Exam         Assessment & Plan:    See Problem List for Assessment and Plan of chronic medical problems.

## 2023-04-17 ENCOUNTER — Ambulatory Visit (INDEPENDENT_AMBULATORY_CARE_PROVIDER_SITE_OTHER): Payer: Medicare Other | Admitting: Internal Medicine

## 2023-04-17 ENCOUNTER — Encounter: Payer: Self-pay | Admitting: Rheumatology

## 2023-04-17 VITALS — BP 126/84 | HR 65 | Temp 98.3°F | Ht 64.0 in

## 2023-04-17 DIAGNOSIS — I10 Essential (primary) hypertension: Secondary | ICD-10-CM

## 2023-04-17 MED ORDER — AMLODIPINE BESYLATE 2.5 MG PO TABS
2.5000 mg | ORAL_TABLET | Freq: Two times a day (BID) | ORAL | 2 refills | Status: DC
Start: 2023-04-17 — End: 2023-10-18

## 2023-04-17 NOTE — Patient Instructions (Signed)
       Medications changes include :  none     A referral was ordered for cardiology - Dr Duke Salvia.     Someone will call you to schedule an appointment.

## 2023-04-17 NOTE — Assessment & Plan Note (Addendum)
Chronic BP looks good here today but is variable at home which sounds like she is asymptomatic with the highs and lows We have tried several medications and this combination has been working by our measurements but is cumbersome --- most concerning is the variability at home and concern for her renal function Will refer to the htn clinic - Dr Duke Salvia for her input Continue current medication for now --- amlodipine 2.5 mg bid, losartan  25 mg bid, bystolic 2.5 mg daily

## 2023-04-29 IMAGING — DX DG HIP (WITH OR WITHOUT PELVIS) 2-3V*R*
3 series · 3 of 3 positions shown · non-contrast
Comparison: None.

CLINICAL DATA: Recent fall, lateral hip pain

EXAM:
DG HIP (WITH OR WITHOUT PELVIS) 2-3V RIGHT

[pelvis ap]
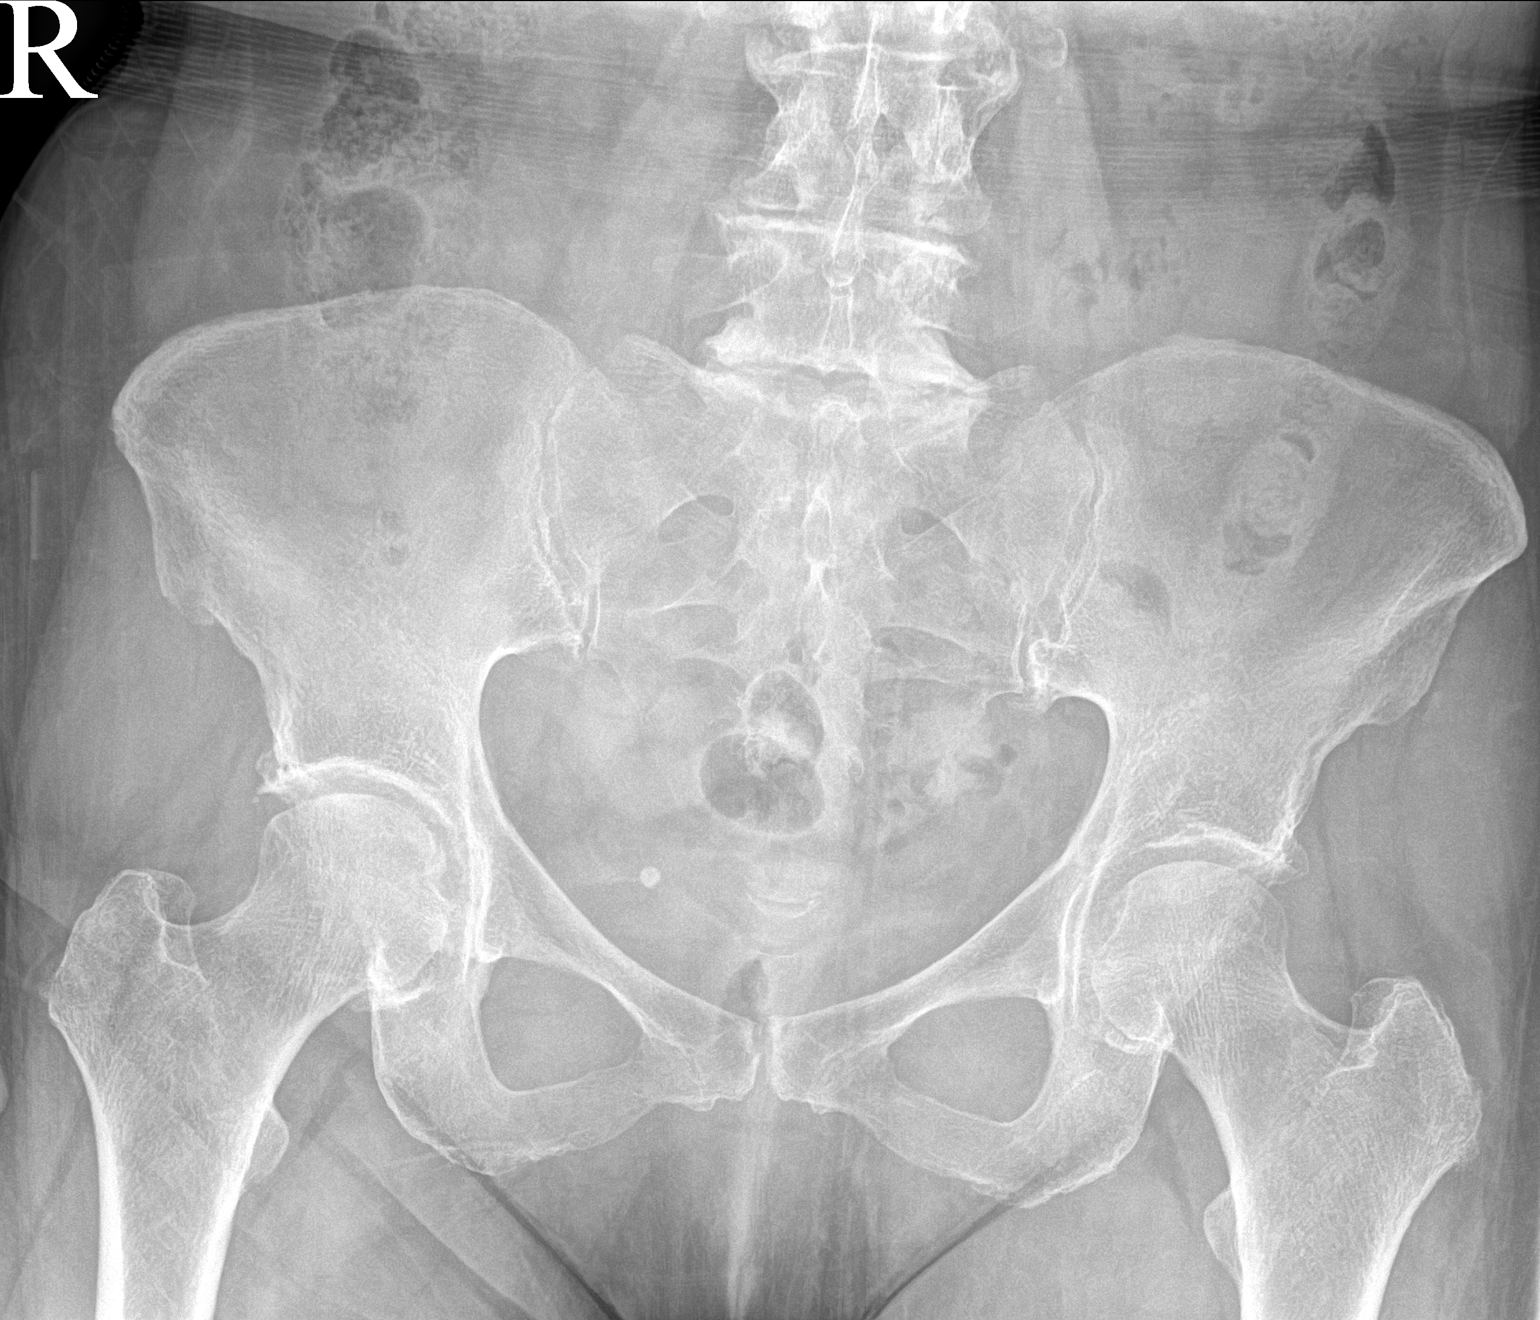

[hip ap]
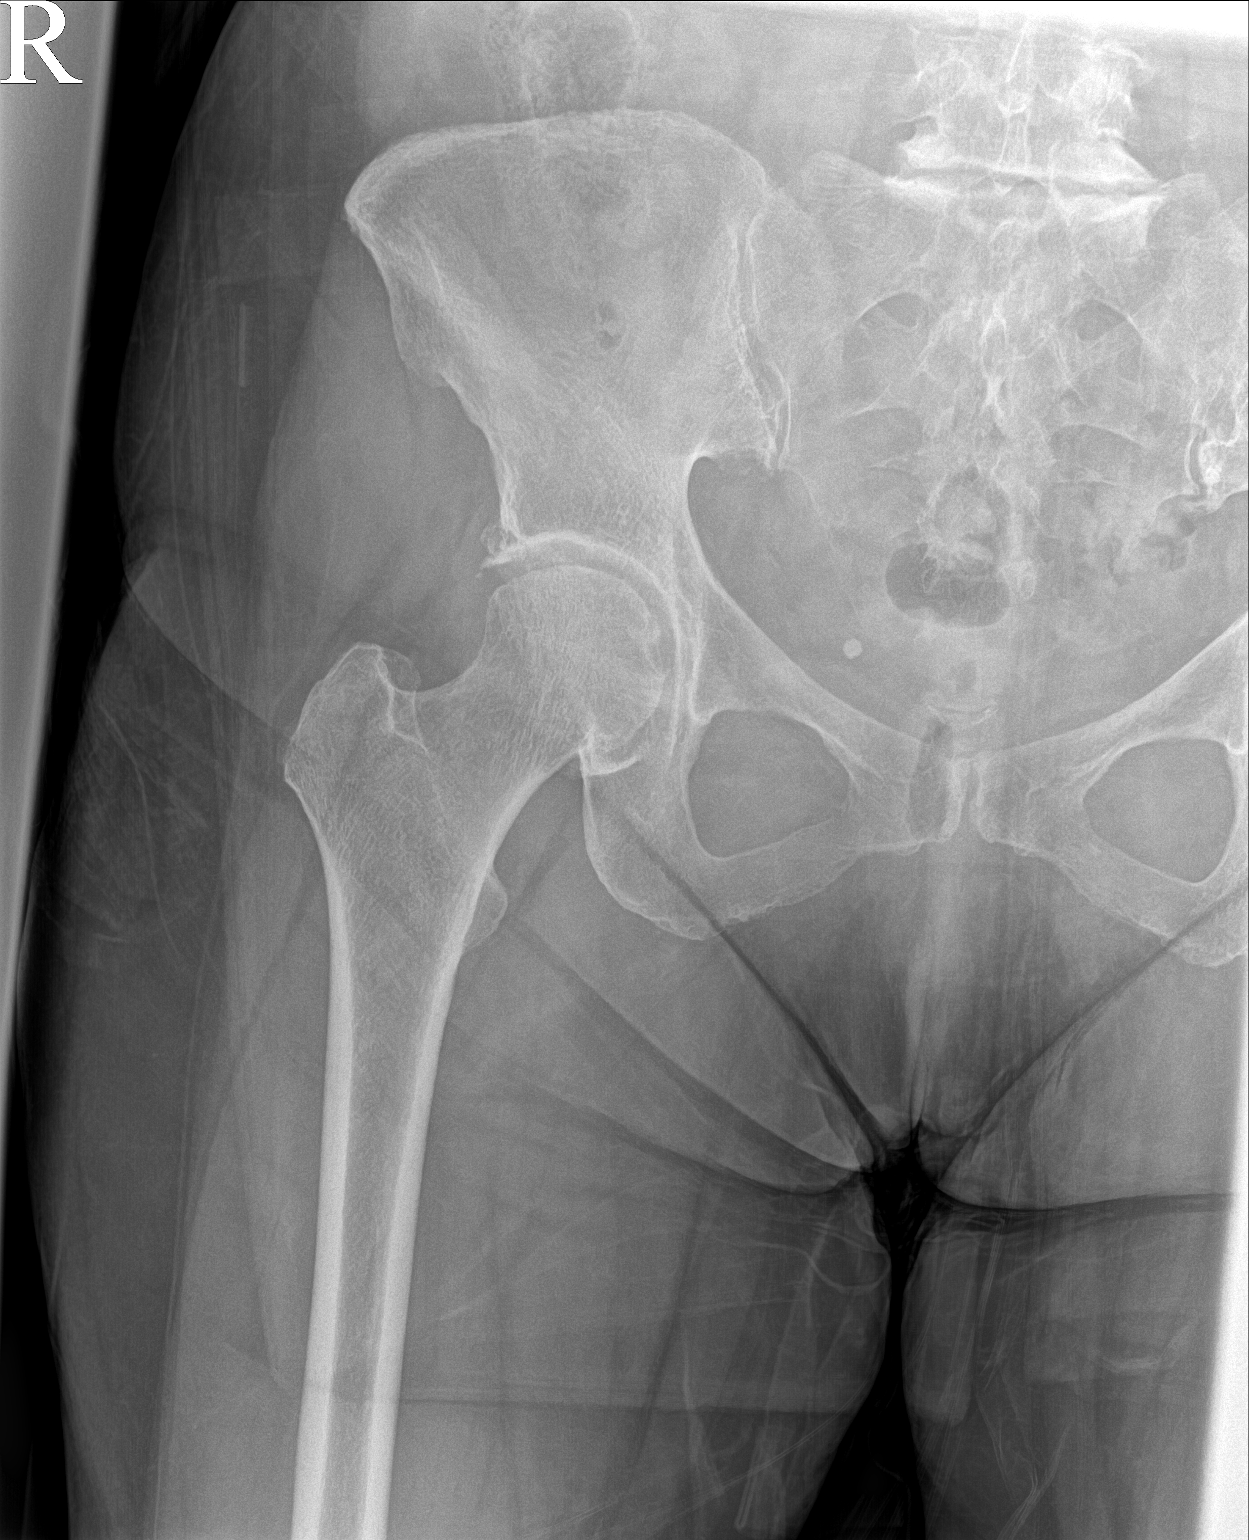

[hip frog leg]
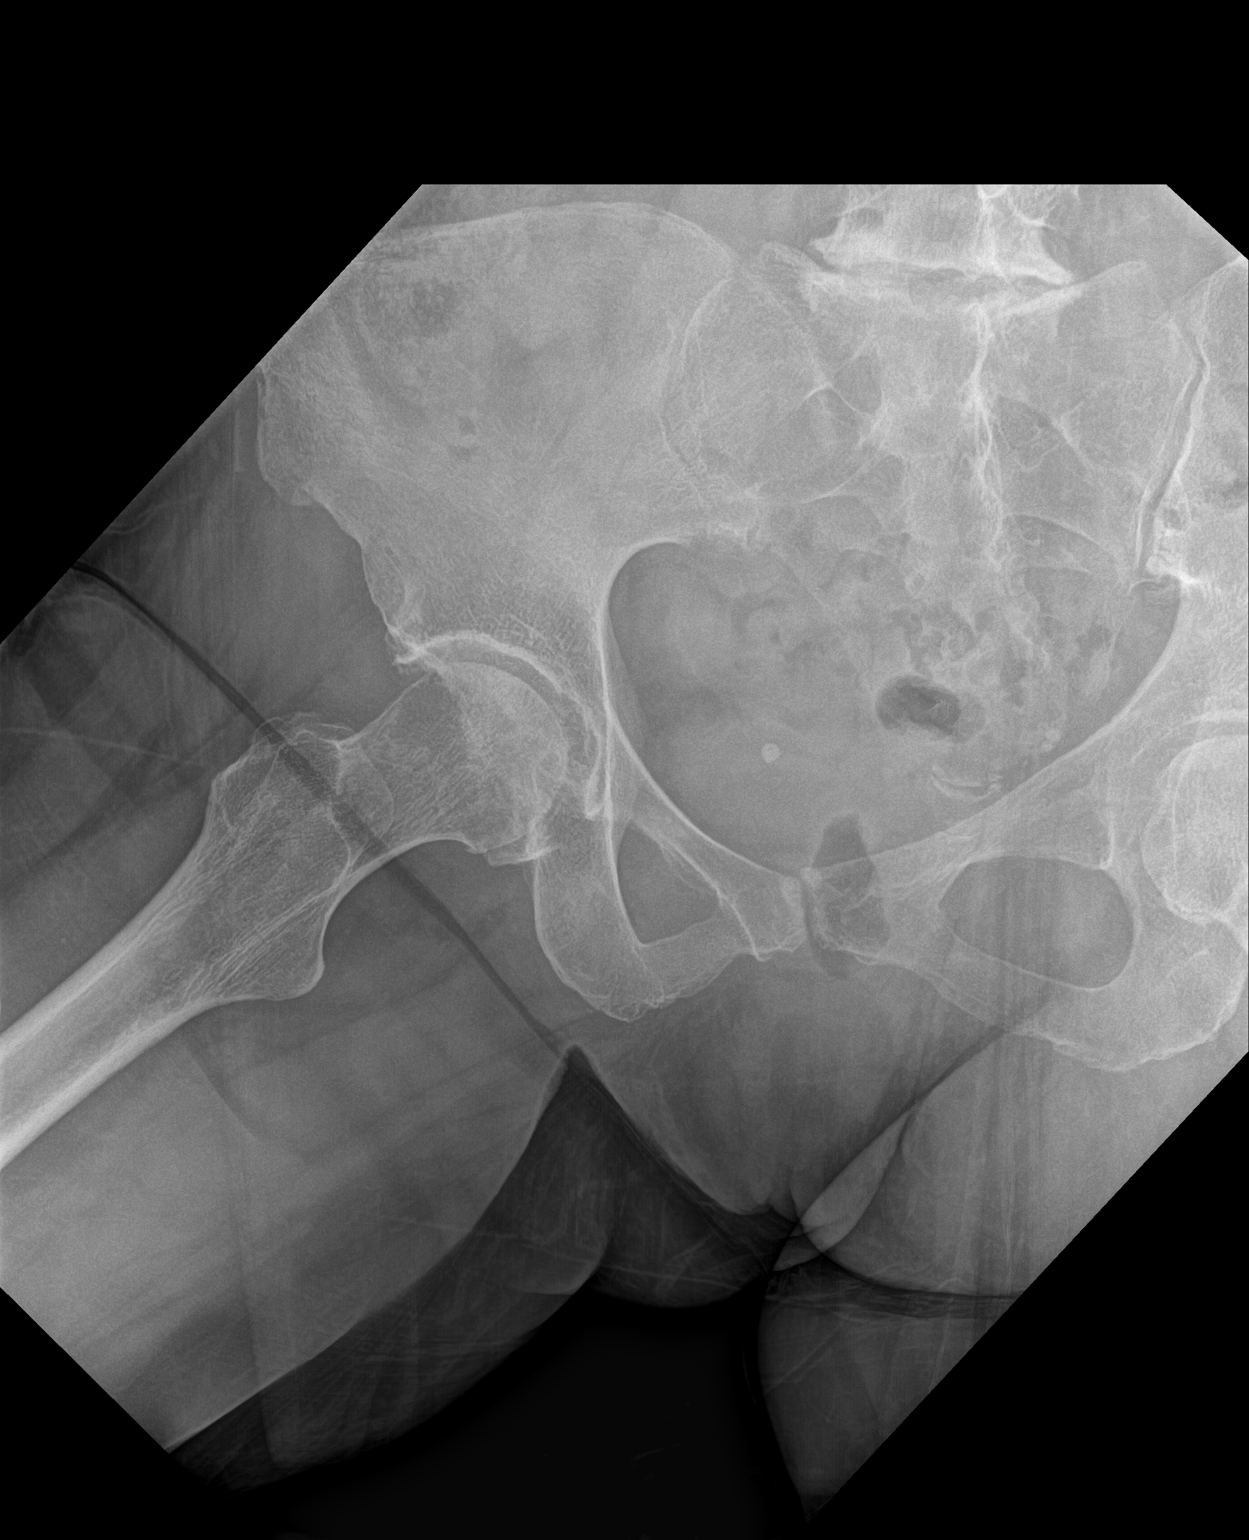

[3 of 3 positions shown; findings below may reference images not displayed]

FINDINGS: Normal right hip alignment without acute fracture, subluxation, or
dislocation. Degenerative changes of the lower lumbar spine and SI
joints. Bony pelvis and hips appear symmetric intact. Nonobstructive
bowel gas pattern. Benign pelvic vascular calcification on the
right.
IMPRESSION: No acute osseous finding.

## 2023-05-30 NOTE — Progress Notes (Deleted)
Office Visit Note  Patient: [name]             Date of Birth: June 24, 1946           MRN: [id number]             PCP: Pincus Sanes, MD Referring: Pincus Sanes, MD Visit Date: 06/13/2023 Occupation: @GUAROCC @  Subjective:  No chief complaint on file.   History of Present Illness: [name] is a 77 y.o. female ***     Activities of Daily Living:  Patient reports morning stiffness for *** {minute/hour:19697}.   Patient {ACTIONS;DENIES/REPORTS:21021675::"Denies"} nocturnal pain.  Difficulty dressing/grooming: {ACTIONS;DENIES/REPORTS:21021675::"Denies"} Difficulty climbing stairs: {ACTIONS;DENIES/REPORTS:21021675::"Denies"} Difficulty getting out of chair: {ACTIONS;DENIES/REPORTS:21021675::"Denies"} Difficulty using hands for taps, buttons, cutlery, and/or writing: {ACTIONS;DENIES/REPORTS:21021675::"Denies"}  No Rheumatology ROS completed.   PMFS History:  Patient Active Problem List   Diagnosis Date Noted   Chronic right shoulder pain 03/29/2023   Lumbar radiculopathy 09/09/2022   Acute UTI 08/09/2022   Agatston coronary artery calcium score of 0,  2023 07/28/2022   Hypertension 07/05/2022   Renal lesion 04/14/2022   Coccyx pain 05/19/2021   B12 deficiency 04/15/2021   COVID-19 04/14/2021   Autoimmune disorder (HCC) 04/13/2021   At risk for long QT syndrome 01/21/2021   Lumbar back pain with radiculopathy affecting left lower extremity 08/05/2020   Other chest pain 03/10/2020   Shingles 11/19/2019   Pain in both knees 07/09/2019   Right arm pain 02/26/2019   Acute pain of left knee 08/27/2018   Hand arthritis 06/13/2018   Trigger middle finger of right hand 06/13/2018   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   Intermittent palpitations 10/11/2017   Migraine 12/30/2015   Upper airway cough syndrome 10/29/2015   Osteoporosis 06/06/2011   Vitamin D deficiency 03/22/2010   BREAST CANCER, HX OF 03/22/2010   SPINAL STENOSIS, LUMBAR 03/25/2008    HYPERLIPIDEMIA 09/28/2007   Fibromyalgia 05/24/2007    Past Medical History:  Diagnosis Date   Arthritis    Breast cancer (HCC)    R lumpectomy ; radiation; no chemotherapy   Bronchitis    recurrent   Bulging lumbar disc    Cataract    multifocal lens implant   Chronic low back pain    bulging disc & spinal stenosis   Coccygeal fracture (HCC) 05/15/2021   COVID-19    COVID-19 02/2023   Cystitis    Fibromyalgia    Hyperlipemia    Mitral valve prolapse    Osteoporosis    Personal history of radiation therapy    Pneumonia    as OP X 2; no Pneumovax   Vitamin D deficiency    19 in  01/2009    Family History  Problem Relation Age of Onset   Atrial fibrillation Mother        S/P cardioversion   Ulcers Mother    Hypertension Mother    Dementia Mother    Heart attack Father 8       died @ 35   Hypertension Brother    Diabetes Brother    Atrial fibrillation Brother    Heart attack Paternal Aunt        < 65   Heart attack Paternal Uncle        > 55   Heart attack Paternal Grandmother        < 65   Heart attack Paternal Grandfather        > 55   Thyroid disease Daughter  on Synthroid since high school   Healthy Son    Stroke Neg Hx    Breast cancer Neg Hx    Past Surgical History:  Procedure Laterality Date   ABDOMINAL HYSTERECTOMY  1990   [facility] for fibroid , Dr Laureen Ochs   BREAST LUMPECTOMY Right    BREAST SURGERY  2010   R side lumpectomy; Dr Jamey Ripa   CATARACT EXTRACTION, BILATERAL  2017   CESAREAN SECTION      G 2 P 2   CYSTECTOMY  2010   EYE SURGERY     eye lid surgery   FOOT SURGERY Right    no colonoscopy     "I just don't want to do it". SOC reviewed   Social History   Social History Narrative   Not on file   Immunization History  Administered Date(s) Administered   Fluad Quad(high Dose 65+) 10/02/2019, 09/09/2022   PFIZER(Purple Top)SARS-COV-2 Vaccination 01/08/2020, 01/27/2020, 10/17/2020   Td 12/20/1999     Objective: Vital Signs:  There were no vitals taken for this visit.   Physical Exam   Musculoskeletal Exam: ***  CDAI Exam: CDAI Score: -- Patient Global: --; Provider Global: -- Swollen: --; Tender: -- Joint Exam 06/13/2023   No joint exam has been documented for this visit   There is currently no information documented on the homunculus. Go to the Rheumatology activity and complete the homunculus joint exam.  Investigation: No additional findings.  Imaging: No results found.  Recent Labs: Lab Results  Component Value Date   WBC 5.0 03/10/2023   HGB 12.9 03/10/2023   PLT 271.0 03/10/2023   NA 140 03/10/2023   K 4.9 03/10/2023   CL 104 03/10/2023   CO2 26 03/10/2023   GLUCOSE 92 03/10/2023   BUN 23 03/10/2023   CREATININE 1.28 (H) 03/10/2023   BILITOT 0.5 03/10/2023   ALKPHOS 65 03/10/2023   AST 19 03/10/2023   ALT 16 03/10/2023   PROT 7.4 03/10/2023   ALBUMIN 4.7 03/10/2023   CALCIUM 9.7 03/10/2023   GFRAA 58 (L) 05/24/2021   QFTBGOLDPLUS NEGATIVE 10/27/2021    Speciality Comments: PLQ Eye Exam: 6/5/2024WNL @ [facility] Follow up in 6 months.  Procedures:  No procedures performed Allergies: Morphine, Albuterol, Augmentin [amoxicillin-pot clavulanate], Ciprofloxacin, Fish allergy, Gabapentin, Risedronate sodium, Cyclobenzaprine, and Ibandronate sodium   Assessment / Plan:     Visit Diagnoses: Autoimmune disease (HCC)  High risk medication use  Chronic right shoulder pain  Primary osteoarthritis of both hands  Trigger middle finger of right hand  Primary osteoarthritis of right hip  Effusion, left knee  Primary osteoarthritis of both feet  DDD (degenerative disc disease), cervical  DDD (degenerative disc disease), lumbar  Age-related osteoporosis without current pathological fracture  Vitamin D deficiency  Closed fracture of coccyx with routine healing, subsequent encounter  Fibromyalgia  Other fatigue  History of breast cancer  Primary  hypertension  Vitamin B12 deficiency  Hx of migraines  History of shingles  Orders: No orders of the defined types were placed in this encounter.  No orders of the defined types were placed in this encounter.   Face-to-face time spent with patient was *** minutes. Greater than 50% of time was spent in counseling and coordination of care.  Follow-Up Instructions: No follow-ups on file.   Gearldine Bienenstock, PA-C  Note - This record has been created using Dragon software.  Chart creation errors have been sought, but may not always  have been located. Such creation  errors do not reflect on  the standard of medical care.

## 2023-06-07 NOTE — Progress Notes (Signed)
Office Visit Note  Patient: [name]             Date of Birth: Jul 18, 1946           MRN: [id number]             PCP: Pincus Sanes, MD Referring: Pincus Sanes, MD Visit Date: 06/16/2023 Occupation: @GUAROCC @  Subjective:  Medication monitoring   History of Present Illness: [name] is a 77 y.o. female with history of autoimmune disease and osteoarthritis.  Patient remains on Plaquenil 200 mg 1 tablet by mouth twice daily Monday through Friday.  She is tolerating Plaquenil without any side effects.  The plan at her last office visit was for her to initiate low-dose Imuran 50 mg 1 tablet daily.  She has not yet initiated Imuran due to the concern for possible side effects.  Patient states that she read that Imuran is used as a chemotherapy agent. Patient states that day-to-day she continues to have profound fatigue.  She states that she is only able to be awake for about 10 hours/day.  She tries to walk on a daily basis for exercise but continues to have pain and inflammation in the left knee as well as discomfort in her lower back.  She has ongoing difficulty climbing steps. She has been getting dry needling which has helped to alleviate some of her lower back pain.  She is been taking Tylenol on a daily basis for pain relief.     Activities of Daily Living:  Patient reports morning stiffness for 1-2 hours.   Patient Denies nocturnal pain.  Difficulty dressing/grooming: Denies Difficulty climbing stairs: Reports Difficulty getting out of chair: Denies Difficulty using hands for taps, buttons, cutlery, and/or writing: Reports  Review of Systems  Constitutional:  Positive for fatigue.  HENT:  Negative for mouth sores and mouth dryness.   Eyes:  Positive for dryness.  Respiratory:  Negative for shortness of breath.   Cardiovascular:  Positive for chest pain and palpitations.  Gastrointestinal:  Negative for blood in stool, constipation and diarrhea.  Endocrine:  Negative for increased urination.  Genitourinary:  Negative for involuntary urination.  Musculoskeletal:  Positive for joint pain, gait problem, joint pain, myalgias, muscle weakness, morning stiffness, muscle tenderness and myalgias. Negative for joint swelling.  Skin:  Negative for color change, rash, hair loss and sensitivity to sunlight.  Allergic/Immunologic: Negative for susceptible to infections.  Neurological:  Negative for dizziness and headaches.  Hematological:  Negative for swollen glands.  Psychiatric/Behavioral:  Positive for depressed mood. Negative for sleep disturbance. The patient is nervous/anxious.     PMFS History:  Patient Active Problem List   Diagnosis Date Noted   Chronic right shoulder pain 03/29/2023   Lumbar radiculopathy 09/09/2022   Acute UTI 08/09/2022   Agatston coronary artery calcium score of 0,  2023 07/28/2022   Hypertension 07/05/2022   Renal lesion 04/14/2022   Coccyx pain 05/19/2021   B12 deficiency 04/15/2021   COVID-19 04/14/2021   Autoimmune disorder (HCC) 04/13/2021   At risk for long QT syndrome 01/21/2021   Lumbar back pain with radiculopathy affecting left lower extremity 08/05/2020   Other chest pain 03/10/2020   Shingles 11/19/2019   Pain in both knees 07/09/2019   Right arm pain 02/26/2019   Acute pain of left knee 08/27/2018   Hand arthritis 06/13/2018   Trigger middle finger of right hand 06/13/2018   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   Intermittent palpitations 10/11/2017  Migraine 12/30/2015   Upper airway cough syndrome 10/29/2015   Osteoporosis 06/06/2011   Vitamin D deficiency 03/22/2010   BREAST CANCER, HX OF 03/22/2010   SPINAL STENOSIS, LUMBAR 03/25/2008   HYPERLIPIDEMIA 09/28/2007   Fibromyalgia 05/24/2007    Past Medical History:  Diagnosis Date   Arthritis    Breast cancer (HCC)    R lumpectomy ; radiation; no chemotherapy   Bronchitis    recurrent   Bulging lumbar disc    Cataract    multifocal lens  implant   Chronic low back pain    bulging disc & spinal stenosis   Coccygeal fracture (HCC) 05/15/2021   COVID-19    COVID-19 02/2023   Cystitis    Fibromyalgia    Hyperlipemia    Mitral valve prolapse    Osteoporosis    Personal history of radiation therapy    Pneumonia    as OP X 2; no Pneumovax   Vitamin D deficiency    19 in  01/2009    Family History  Problem Relation Age of Onset   Atrial fibrillation Mother        S/P cardioversion   Ulcers Mother    Hypertension Mother    Dementia Mother    Heart attack Father 57       died @ 55   Hypertension Brother    Diabetes Brother    Atrial fibrillation Brother    Heart attack Paternal Aunt        < 65   Heart attack Paternal Uncle        > 55   Heart attack Paternal Grandmother        < 65   Heart attack Paternal Grandfather        > 55   Thyroid disease Daughter        on Synthroid since high school   Healthy Son    Stroke Neg Hx    Breast cancer Neg Hx    Past Surgical History:  Procedure Laterality Date   ABDOMINAL HYSTERECTOMY  1990   USO for fibroid , Dr Laureen Ochs   BREAST LUMPECTOMY Right    BREAST SURGERY  2010   R side lumpectomy; Dr Jamey Ripa   CATARACT EXTRACTION, BILATERAL  2017   CESAREAN SECTION      G 2 P 2   CYSTECTOMY  2010   EYE SURGERY     eye lid surgery   FOOT SURGERY Right    no colonoscopy     "I just don't want to do it". SOC reviewed   Social History   Social History Narrative   Not on file   Immunization History  Administered Date(s) Administered   Fluad Quad(high Dose 65+) 10/02/2019, 09/09/2022   PFIZER(Purple Top)SARS-COV-2 Vaccination 01/08/2020, 01/27/2020, 10/17/2020   Td 12/20/1999     Objective: Vital Signs: BP 110/73 (BP Location: Left Arm, Patient Position: Sitting, Cuff Size: Normal)   Pulse 60   Resp 13   Ht 5' 4.5" (1.638 m)   Wt 138 lb (62.6 kg) Comment: per patient  BMI 23.32 kg/m    Physical Exam Vitals and nursing note reviewed.  Constitutional:       Appearance: She is well-developed.  HENT:     Head: Normocephalic and atraumatic.  Eyes:     Conjunctiva/sclera: Conjunctivae normal.  Cardiovascular:     Rate and Rhythm: Normal rate and regular rhythm.     Heart sounds: Normal heart sounds.  Pulmonary:     Effort:  Pulmonary effort is normal.     Breath sounds: Normal breath sounds.  Abdominal:     General: Bowel sounds are normal.     Palpations: Abdomen is soft.  Musculoskeletal:     Cervical back: Normal range of motion.  Lymphadenopathy:     Cervical: No cervical adenopathy.  Skin:    General: Skin is warm and dry.     Capillary Refill: Capillary refill takes less than 2 seconds.  Neurological:     Mental Status: She is alert and oriented to person, place, and time.  Psychiatric:        Behavior: Behavior normal.      Musculoskeletal Exam: C-spine has limited range of motion with lateral rotation.  Painful range of motion of the lumbar spine.  Limited mobility of the lumbar spine.  Shoulder joints have good range of motion.  Elbow joints have good range of motion with no tenderness.  Wrist joints have good range of motion with no tenderness or synovitis.  Synovitis of the right second MCP joint.  PIP and DIP thickening.  CMC joint thickening.  Hip joints have good range of motion with no groin pain.  Tenderness over bilateral trochanteric bursa.  Warmth and swelling noted in the left knee.  Right knee has no warmth or effusion.  Ankle joints have good range of motion with no tenderness or joint swelling.  CDAI Exam: CDAI Score: -- Patient Global: --; Provider Global: -- Swollen: --; Tender: -- Joint Exam 06/16/2023   No joint exam has been documented for this visit   There is currently no information documented on the homunculus. Go to the Rheumatology activity and complete the homunculus joint exam.  Investigation: No additional findings.  Imaging: No results found.  Recent Labs: Lab Results  Component Value  Date   WBC 5.0 03/10/2023   HGB 12.9 03/10/2023   PLT 271.0 03/10/2023   NA 140 03/10/2023   K 4.9 03/10/2023   CL 104 03/10/2023   CO2 26 03/10/2023   GLUCOSE 92 03/10/2023   BUN 23 03/10/2023   CREATININE 1.28 (H) 03/10/2023   BILITOT 0.5 03/10/2023   ALKPHOS 65 03/10/2023   AST 19 03/10/2023   ALT 16 03/10/2023   PROT 7.4 03/10/2023   ALBUMIN 4.7 03/10/2023   CALCIUM 9.7 03/10/2023   GFRAA 58 (L) 05/24/2021   QFTBGOLDPLUS NEGATIVE 10/27/2021    Speciality Comments: PLQ Eye Exam: 6/5/2024WNL @ [facility] Follow up in 6 months.  Procedures:  No procedures performed Allergies: Morphine, Albuterol, Augmentin [amoxicillin-pot clavulanate], Ciprofloxacin, Fish allergy, Gabapentin, Risedronate sodium, Cyclobenzaprine, and Ibandronate sodium   Assessment / Plan:     Visit Diagnoses: Autoimmune disease (HCC) - Positive ANA, dry eyes, hair loss, Raynaud's, inflammatory arthritis: Patient continues to experience chronic fatigue, myalgias, and arthralgias.  She has tenderness and synovitis of the right second MCP joint and a small effusion in the left knee on examination today.  Patient remains on Plaquenil 200 mg 1 tablet by mouth twice daily Monday through Friday.  She is tolerating Plaquenil without any side effects.  The plan at her last office visit on 04/05/2023 was for her to initiate Imuran 50 mg 1 tablet daily.  She did not start Imuran due to the concern for possible side effects. Reviewed the indications, contraindications, and potential side effects of Imuran were discussed again today in detail.  All questions were addressed.  Patient plans on initiating a trial of Imuran 50 mg 1 tablet by mouth daily.  She will notify us if she cannot tolerate taking Imuran.  Patient was advised to return for updated CBC and CMP 1 month after initiating therapy and then every 3 months.  She will remain on Plaquenil as prescribed. The following lab work will be updated today.  She will  remain on imuran and plaquenil as prescribed.  She was advised to notify us if she develops any other new or worsening symptoms.  - Plan: COMPLETE METABOLIC PANEL WITH GFR, Protein / creatinine ratio, urine, CBC with Differential/Platelet, ANA, Anti-DNA antibody, double-stranded, C3 and C4, Sedimentation rate  High risk medication use - Imuran 50 mg p.o. daily, Hydroxychloroquine 200 mg p.o. twice daily Monday to Friday.  PLQ Eye Exam: 6/5/2024WNL @ [facility] Follow up in 6 months.  Orders for CBC and CMP released today. - Plan: COMPLETE METABOLIC PANEL WITH GFR, CBC with Differential/Platelet  Chronic right shoulder pain - X-rays of the right shoulder joint obtained on March 29, 2023 were reviewed which showed mild glenohumeral degenerative changes.  Good range of motion about shoulder joints on examination today.  Primary osteoarthritis of both hands: PIP and DIP thickening consistent with osteoarthritis of both hands.  Trigger middle finger of right hand: Intermittent locking.    Primary osteoarthritis of right hip: Good ROM of the right hip.  No groin pain noted today.   Effusion, left knee: She has ongoing warmth and swelling in the left knee.  Small effusion noted today.  The patient has not yet initiated Imuran as discussed at her last office visit on 04/05/2023.  Patient was encouraged to initiate Imuran as discussed above.  Primary osteoarthritis of both feet: She is wearing proper fitting shoes.   DDD (degenerative disc disease), cervical - x-rays from March 29, 2023 showed degenerative changes and posterior osteophytes at C6- C7.  Patient has seen Dr. Yevette Edwards in the past. Limited lateral rotation.   DDD (degenerative disc disease), lumbar: Chronic pain and limited mobility.  Undergoing dry needling which has been helpful.   Age-related osteoporosis without current pathological fracture - 04/23/21 DEXA scan showed left femoral neck T score -2.7.  Intolerance to Fosamax  and Boniva in the past.  She is taking vitamin D 1000 units daily and a calcium supplement daily.  Due to update DEXA.  Vitamin D deficiency: She is taking vitamin D 1000 units daily.   Closed fracture of coccyx with routine healing, subsequent encounter  Fibromyalgia - She continues to have generalized hyperalgesia and positive tender points on exam.  She takes Tramadol 50 mg p.o. twice daily as needed and trazodone 100 mg p.o. nightly as needed insomnia patient continues to experience fatigue on a daily basis.  According to the patient she is only able to stay awake for about 10 hours daily.  She has been trying to walk on a daily basis for exercise.  Discussed the importance of regular exercise and good sleep hygiene.  Other fatigue: Patient continues to have persistent fatigue on a daily basis.  According to the patient she is only able to stay awake for about 10 hours/day.  She has been having to go to bed early at night and sleeping and in the mornings.  She has been trying to walk on a daily basis for exercise but continues to have persistent fatigue.  Plan to check the following lab work today.  Plan for her to initiate Imuran 50 mg daily.  Other medical conditions are listed as follows:   Primary hypertension: BP was  110/73 today in the office.   History of breast cancer  Vitamin B12 deficiency  Hx of migraines  History of shingles  Orders: Orders Placed This Encounter  Procedures   COMPLETE METABOLIC PANEL WITH GFR   Protein / creatinine ratio, urine   CBC with Differential/Platelet   ANA   Anti-DNA antibody, double-stranded   C3 and C4   Sedimentation rate   No orders of the defined types were placed in this encounter.     Follow-Up Instructions: Return in about 5 months (around 11/16/2023) for Autoimmune Disease, Osteoarthritis.   Gearldine Bienenstock, PA-C  Note - This record has been created using Dragon software.  Chart creation errors have been sought, but may  not always  have been located. Such creation errors do not reflect on  the standard of medical care.

## 2023-06-09 ENCOUNTER — Other Ambulatory Visit: Payer: Self-pay

## 2023-06-09 ENCOUNTER — Other Ambulatory Visit: Payer: Self-pay | Admitting: Internal Medicine

## 2023-06-09 MED ORDER — HYDROXYCHLOROQUINE SULFATE 200 MG PO TABS: ORAL_TABLET | ORAL | 0 refills | Status: DC

## 2023-06-09 NOTE — Telephone Encounter (Signed)
Refill request for plaquenil received via fax from [facility] on [address]   Maurice Small Fill: 03/13/2023  Eye exam: 05/24/2023   Labs: 03/10/2023 creatinine 1.28, GFR 40.83  Next Visit: 06/16/2023  Last Visit: 04/05/2023  DX:Autoimmune disease   Current Dose per office note on 04/05/2023: Hydroxychloroquine 200 mg p.o. twice daily Monday to Friday.   Okay to refill Plaquenil?

## 2023-06-13 ENCOUNTER — Ambulatory Visit: Payer: BLUE CROSS/BLUE SHIELD | Admitting: Physician Assistant

## 2023-06-13 DIAGNOSIS — M1611 Unilateral primary osteoarthritis, right hip: Secondary | ICD-10-CM

## 2023-06-13 DIAGNOSIS — G8929 Other chronic pain: Secondary | ICD-10-CM

## 2023-06-13 DIAGNOSIS — E559 Vitamin D deficiency, unspecified: Secondary | ICD-10-CM

## 2023-06-13 DIAGNOSIS — M19041 Primary osteoarthritis, right hand: Secondary | ICD-10-CM

## 2023-06-13 DIAGNOSIS — M65331 Trigger finger, right middle finger: Secondary | ICD-10-CM

## 2023-06-13 DIAGNOSIS — I10 Essential (primary) hypertension: Secondary | ICD-10-CM

## 2023-06-13 DIAGNOSIS — M5136 Other intervertebral disc degeneration, lumbar region: Secondary | ICD-10-CM

## 2023-06-13 DIAGNOSIS — M810 Age-related osteoporosis without current pathological fracture: Secondary | ICD-10-CM

## 2023-06-13 DIAGNOSIS — E538 Deficiency of other specified B group vitamins: Secondary | ICD-10-CM

## 2023-06-13 DIAGNOSIS — M359 Systemic involvement of connective tissue, unspecified: Secondary | ICD-10-CM

## 2023-06-13 DIAGNOSIS — M5030 Other cervical disc degeneration, unspecified cervical region: Secondary | ICD-10-CM

## 2023-06-13 DIAGNOSIS — Z8669 Personal history of other diseases of the nervous system and sense organs: Secondary | ICD-10-CM

## 2023-06-13 DIAGNOSIS — Z79899 Other long term (current) drug therapy: Secondary | ICD-10-CM

## 2023-06-13 DIAGNOSIS — M25462 Effusion, left knee: Secondary | ICD-10-CM

## 2023-06-13 DIAGNOSIS — R5383 Other fatigue: Secondary | ICD-10-CM

## 2023-06-13 DIAGNOSIS — M797 Fibromyalgia: Secondary | ICD-10-CM

## 2023-06-13 DIAGNOSIS — Z8619 Personal history of other infectious and parasitic diseases: Secondary | ICD-10-CM

## 2023-06-13 DIAGNOSIS — M19072 Primary osteoarthritis, left ankle and foot: Secondary | ICD-10-CM

## 2023-06-13 DIAGNOSIS — S322XXD Fracture of coccyx, subsequent encounter for fracture with routine healing: Secondary | ICD-10-CM

## 2023-06-13 DIAGNOSIS — Z853 Personal history of malignant neoplasm of breast: Secondary | ICD-10-CM

## 2023-06-16 ENCOUNTER — Encounter: Payer: Self-pay | Admitting: Physician Assistant

## 2023-06-16 ENCOUNTER — Ambulatory Visit: Payer: Medicare Other | Attending: Physician Assistant | Admitting: Physician Assistant

## 2023-06-16 VITALS — BP 110/73 | HR 60 | Resp 13 | Ht 64.5 in | Wt 138.0 lb

## 2023-06-16 DIAGNOSIS — M19071 Primary osteoarthritis, right ankle and foot: Secondary | ICD-10-CM | POA: Diagnosis present

## 2023-06-16 DIAGNOSIS — S322XXD Fracture of coccyx, subsequent encounter for fracture with routine healing: Secondary | ICD-10-CM

## 2023-06-16 DIAGNOSIS — Z853 Personal history of malignant neoplasm of breast: Secondary | ICD-10-CM | POA: Diagnosis present

## 2023-06-16 DIAGNOSIS — M19042 Primary osteoarthritis, left hand: Secondary | ICD-10-CM

## 2023-06-16 DIAGNOSIS — Z8619 Personal history of other infectious and parasitic diseases: Secondary | ICD-10-CM

## 2023-06-16 DIAGNOSIS — Z8669 Personal history of other diseases of the nervous system and sense organs: Secondary | ICD-10-CM

## 2023-06-16 DIAGNOSIS — M5136 Other intervertebral disc degeneration, lumbar region: Secondary | ICD-10-CM | POA: Diagnosis present

## 2023-06-16 DIAGNOSIS — I10 Essential (primary) hypertension: Secondary | ICD-10-CM | POA: Diagnosis present

## 2023-06-16 DIAGNOSIS — M797 Fibromyalgia: Secondary | ICD-10-CM | POA: Diagnosis present

## 2023-06-16 DIAGNOSIS — M25511 Pain in right shoulder: Secondary | ICD-10-CM | POA: Insufficient documentation

## 2023-06-16 DIAGNOSIS — E559 Vitamin D deficiency, unspecified: Secondary | ICD-10-CM | POA: Diagnosis present

## 2023-06-16 DIAGNOSIS — M810 Age-related osteoporosis without current pathological fracture: Secondary | ICD-10-CM

## 2023-06-16 DIAGNOSIS — Z79899 Other long term (current) drug therapy: Secondary | ICD-10-CM | POA: Diagnosis present

## 2023-06-16 DIAGNOSIS — M65331 Trigger finger, right middle finger: Secondary | ICD-10-CM

## 2023-06-16 DIAGNOSIS — M51369 Other intervertebral disc degeneration, lumbar region without mention of lumbar back pain or lower extremity pain: Secondary | ICD-10-CM

## 2023-06-16 DIAGNOSIS — M25462 Effusion, left knee: Secondary | ICD-10-CM

## 2023-06-16 DIAGNOSIS — M1611 Unilateral primary osteoarthritis, right hip: Secondary | ICD-10-CM

## 2023-06-16 DIAGNOSIS — E538 Deficiency of other specified B group vitamins: Secondary | ICD-10-CM | POA: Diagnosis present

## 2023-06-16 DIAGNOSIS — M5030 Other cervical disc degeneration, unspecified cervical region: Secondary | ICD-10-CM

## 2023-06-16 DIAGNOSIS — M19072 Primary osteoarthritis, left ankle and foot: Secondary | ICD-10-CM | POA: Insufficient documentation

## 2023-06-16 DIAGNOSIS — G8929 Other chronic pain: Secondary | ICD-10-CM | POA: Diagnosis present

## 2023-06-16 DIAGNOSIS — M19041 Primary osteoarthritis, right hand: Secondary | ICD-10-CM | POA: Diagnosis present

## 2023-06-16 DIAGNOSIS — R5383 Other fatigue: Secondary | ICD-10-CM

## 2023-06-16 DIAGNOSIS — M359 Systemic involvement of connective tissue, unspecified: Secondary | ICD-10-CM

## 2023-06-16 LAB — CBC WITH DIFFERENTIAL/PLATELET
HCT: 35.6 % (ref 35.0–45.0)
MCV: 93.4 fL (ref 80.0–100.0)
RBC: 3.81 10*6/uL (ref 3.80–5.10)

## 2023-06-17 LAB — CBC WITH DIFFERENTIAL/PLATELET
MCH: 31.2 pg (ref 27.0–33.0)
MPV: 10.3 fL (ref 7.5–12.5)
Monocytes Relative: 10.1 %
Neutrophils Relative %: 62.2 %
Platelets: 232 10*3/uL (ref 140–400)

## 2023-06-17 LAB — COMPLETE METABOLIC PANEL WITH GFR
AG Ratio: 2 (calc) (ref 1.0–2.5)
AST: 16 U/L (ref 10–35)
CO2: 23 mmol/L (ref 20–32)
Glucose, Bld: 92 mg/dL (ref 65–99)
Sodium: 140 mmol/L (ref 135–146)
eGFR: 48 mL/min/{1.73_m2} — ABNORMAL LOW (ref >=60)

## 2023-06-17 LAB — PROTEIN / CREATININE RATIO, URINE: Total Protein, Urine: 18 mg/dL (ref 5–24)

## 2023-06-18 LAB — CBC WITH DIFFERENTIAL/PLATELET
Absolute Monocytes: 444 cells/uL (ref 200–950)
Basophils Relative: 0.9 %
WBC: 4.4 10*3/uL (ref 3.8–10.8)

## 2023-06-18 LAB — COMPLETE METABOLIC PANEL WITH GFR
Calcium: 9.4 mg/dL (ref 8.6–10.4)
Globulin: 2.2 g/dL (calc) (ref 1.9–3.7)
Total Protein: 6.6 g/dL (ref 6.1–8.1)

## 2023-06-18 LAB — ANTI-NUCLEAR AB-TITER (ANA TITER): ANA Titer 1: 1:320 {titer} — ABNORMAL HIGH

## 2023-06-19 LAB — COMPLETE METABOLIC PANEL WITH GFR
ALT: 15 U/L (ref 6–29)
Albumin: 4.4 g/dL (ref 3.6–5.1)
Alkaline phosphatase (APISO): 61 U/L (ref 37–153)
BUN/Creatinine Ratio: 21 (calc) (ref 6–22)
BUN: 24 mg/dL (ref 7–25)
Chloride: 108 mmol/L (ref 98–110)
Creat: 1.17 mg/dL — ABNORMAL HIGH (ref 0.60–1.00)
Potassium: 5.4 mmol/L — ABNORMAL HIGH (ref 3.5–5.3)
Total Bilirubin: 0.3 mg/dL (ref 0.2–1.2)

## 2023-06-19 LAB — CBC WITH DIFFERENTIAL/PLATELET
Basophils Absolute: 40 cells/uL (ref 0–200)
Eosinophils Absolute: 119 cells/uL (ref 15–500)
Eosinophils Relative: 2.7 %
Hemoglobin: 11.9 g/dL (ref 11.7–15.5)
Lymphs Abs: 1060 cells/uL (ref 850–3900)
MCHC: 33.4 g/dL (ref 32.0–36.0)
Neutro Abs: 2737 cells/uL (ref 1500–7800)
RDW: 12 % (ref 11.0–15.0)
Total Lymphocyte: 24.1 %

## 2023-06-19 LAB — C3 AND C4
C3 Complement: 120 mg/dL (ref 83–193)
C4 Complement: 32 mg/dL (ref 15–57)

## 2023-06-19 LAB — ANTI-DNA ANTIBODY, DOUBLE-STRANDED: ds DNA Ab: <1 IU/mL

## 2023-06-19 LAB — PROTEIN / CREATININE RATIO, URINE
Creatinine, Urine: 168 mg/dL (ref 20–275)
Protein/Creat Ratio: 107 mg/g creat (ref 24–184)
Protein/Creatinine Ratio: 0.107 mg/mg creat (ref 0.024–0.184)

## 2023-06-19 LAB — ANA: Anti Nuclear Antibody (ANA): POSITIVE — AB

## 2023-06-19 LAB — SEDIMENTATION RATE: Sed Rate: 6 mm/h (ref 0–30)

## 2023-06-19 NOTE — Progress Notes (Signed)
Complements WNL.   Protein creatinine ratio WNL

## 2023-06-19 NOTE — Progress Notes (Signed)
CBC WNL. Creatinine remains elevated but has improved-1.17 and GFR is low-48. Avoid the use of NSAIDs.  Potassium is borderline elevated-5.4.  ESR WNL ANA remains positive  dsDNA is negative

## 2023-07-05 ENCOUNTER — Other Ambulatory Visit: Payer: Self-pay | Admitting: Internal Medicine

## 2023-07-10 ENCOUNTER — Other Ambulatory Visit: Payer: Self-pay | Admitting: Rheumatology

## 2023-07-10 NOTE — Telephone Encounter (Signed)
Last Fill: 04/13/2023  Labs: 06/16/2023 CBC WNL. Creatinine remains elevated but has improved-1.17 and GFR is low-48. Potassium is borderline elevated-5.4.   Next Visit: 11/22/2023  Last Visit: 06/16/2023  DX:  Autoimmune disease   Current Dose per office note 06/16/2023: Imuran 50 mg 1 tablet daily.   Okay to refill Imuran?

## 2023-08-03 ENCOUNTER — Encounter (INDEPENDENT_AMBULATORY_CARE_PROVIDER_SITE_OTHER): Payer: Self-pay

## 2023-08-18 ENCOUNTER — Ambulatory Visit (INDEPENDENT_AMBULATORY_CARE_PROVIDER_SITE_OTHER): Payer: Medicare Other | Admitting: Cardiovascular Disease

## 2023-08-18 ENCOUNTER — Encounter (HOSPITAL_BASED_OUTPATIENT_CLINIC_OR_DEPARTMENT_OTHER): Payer: Self-pay | Admitting: Cardiovascular Disease

## 2023-08-18 VITALS — BP 120/68 | HR 59 | Ht 64.0 in

## 2023-08-18 DIAGNOSIS — I10 Essential (primary) hypertension: Secondary | ICD-10-CM

## 2023-08-18 NOTE — Progress Notes (Signed)
Cardiology Office Note:  .   Date:  08/18/2023  ID:  [name], DOB 1946/10/07, MRN [id number] PCP: Pincus Sanes, MD  [facility] Providers Cardiologist:  None    History of Present Illness: .   [name] is a 77 y.o. female with hypertension, hyperlipidemia, and prior breast cancer who is being seen today for the evaluation of labile hypertension at the request of Pincus Sanes, MD.  [name], has been experiencing fluctuating blood pressure despite being on three medications. She reports that her blood pressure has been "going up and down and all around." She has been monitoring her blood pressure at home, albeit inconsistently, sometimes not checking it for weeks. The readings have been taken at random times throughout the day, with no specific pattern.  She is currently on amlodipine, losartan, and Bystolic. The amlodipine and losartan are taken twice a day, once in the morning and once at night, while the Bystolic is taken once a day in the morning. The patient has been dealing with high blood pressure for an unspecified duration, and the readings in the medical records are generally good.  She leads an active lifestyle, engaging in daily walks and fitness classes when possible. However, she has been experiencing difficulty with these activities due to a ruptured disc. Her walks typically last 20 to 30 minutes, and she is able to carry on a full conversation during these walks.  [name] has also been experiencing swelling in her legs and feet, with the left leg being more affected than the right. The swelling tends to improve by morning after the patient has had her legs elevated in bed. She denies any shortness of breath when lying in bed and reports no issues with heart racing or palpitations.  The patient's family history includes heart disease, with both parents affected. Her father had his first heart attack at 30 and passed away at 60, while her mother  had AFib but did not have a heart attack.  The patient has noticed that her blood pressure tends to be high when her ankles and legs swell. She mostly cooks at home and does not consume much salt. She has also noticed that her blood pressure can sometimes be very low, but this is not a frequent occurrence.      She previously saw Dr. Shirlee Latch 2011 for chest tightness.  Symptoms were atypical.  She was overall thought to be low risk other than uncontrolled cholesterol.  He recommended stress testing but she declined.  ROS:  As per HPI  Studies Reviewed: .       08/18/2023: Sinus rhythm.  Rate 61 bpm.  Risk Assessment/Calculations:             Physical Exam:   VS:  BP 120/68   Pulse (!) 59   Ht 5\' 4"  (1.626 m)   SpO2 99%   BMI 23.69 kg/m  , BMI Body mass index is 23.69 kg/m. GENERAL:  Well appearing HEENT: Pupils equal round and reactive, fundi not visualized, oral mucosa unremarkable NECK:  No jugular venous distention, waveform within normal limits, carotid upstroke brisk and symmetric, no bruits, no thyromegaly LUNGS:  Clear to auscultation bilaterally HEART:  RRR.  PMI not displaced or sustained,S1 and S2 within normal limits, no S3, no S4, no clicks, no rubs, no murmurs ABD:  Flat, positive bowel sounds normal in frequency in pitch, no bruits, no rebound, no guarding, no midline pulsatile mass, no hepatomegaly, no  splenomegaly EXT:  2 plus pulses throughout, trace edema, no cyanosis no clubbing SKIN:  No rashes no nodules NEURO:  Cranial nerves II through XII grossly intact, motor grossly intact throughout PSYCH:  Cognitively intact, oriented to person place and time  ASSESSMENT AND PLAN: .    # Labile Hypertension Fluctuating blood pressure readings at home, with some readings in the 180s/90s. Patient is currently on Amlodipine twice daily, Losartan twice daily, and Bystolic once daily. No clear correlation with stress, pain, or dietary factors. Patient has a history of  occasional low readings and reports feeling "strange" during these times. No evidence of secondary causes of hypertension based on the conversation. -Continue current antihypertensive regimen. -Check blood pressure twice daily for a couple of weeks to identify any patterns. -Consider adjusting Losartan dose based on blood pressure readings. -Check cortisol levels to rule out cortisol excess as a cause of fluctuating blood pressure. -Check labs for pheochromocytoma to rule out this rare cause of fluctuating blood pressure. -Follow-up in a month to reassess blood pressure control.  # Lower extremity edema Patient reports occasional swelling in legs, worse in the left leg. No associated symptoms of heart failure.  May be related to amlodipine.  -Continue monitoring.  # Chronic Kidney Disease (Stage 3a) Stable, with slightly elevated potassium on last lab work in June. Patient is on Losartan, which can protect kidney function. -Continue Losartan. -Avoid NSAIDs due to potential for further kidney damage. -Continue monitoring kidney function.  General Health Maintenance -Encourage increased exercise, if possible, to potentially improve blood pressure control. -Continue current low-salt diet. -Check thyroid function as part of routine health maintenance (last checked in March).           Dispo: f/u 1 month with APP in [facility]   Signed, Chilton Si, MD

## 2023-08-18 NOTE — Patient Instructions (Signed)
Medication Instructions:  Your physician recommends that you continue on your current medications as directed. Please refer to the Current Medication list given to you today.    Labwork: CORTISOL/CATACHOLAMINES/METANEPHRINE SOON  NEEDS TO BE DONE FIRST THING IN THE MORNING    Testing/Procedures: NONE    Follow-Up: 1 MONTH WITH CAITLIN W NP IN ADV HTN    Special Instructions:  MONITOR YOUR BLOOD PRESSURE TWICE A DAY, LOG IN THE BOOK PROVIDED. BRING THE BOOK AND YOUR BLOOD PRESSURE MACHINE TO YOUR FOLLOW UP IN 1 MONTH    DASH Eating Plan DASH stands for "Dietary Approaches to Stop Hypertension." The DASH eating plan is a healthy eating plan that has been shown to reduce high blood pressure (hypertension). It may also reduce your risk for type 2 diabetes, heart disease, and stroke. The DASH eating plan may also help with weight loss. What are tips for following this plan?  General guidelines Avoid eating more than 2,300 mg (milligrams) of salt (sodium) a day. If you have hypertension, you may need to reduce your sodium intake to 1,500 mg a day. Limit alcohol intake to no more than 1 drink a day for nonpregnant women and 2 drinks a day for men. One drink equals 12 oz of beer, 5 oz of wine, or 1 oz of hard liquor. Work with your health care provider to maintain a healthy body weight or to lose weight. Ask what an ideal weight is for you. Get at least 30 minutes of exercise that causes your heart to beat faster (aerobic exercise) most days of the week. Activities may include walking, swimming, or biking. Work with your health care provider or diet and nutrition specialist (dietitian) to adjust your eating plan to your individual calorie needs. Reading food labels  Check food labels for the amount of sodium per serving. Choose foods with less than 5 percent of the Daily Value of sodium. Generally, foods with less than 300 mg of sodium per serving fit into this eating plan. To find whole  grains, look for the word "whole" as the first word in the ingredient list. Shopping Buy products labeled as "low-sodium" or "no salt added." Buy fresh foods. Avoid canned foods and premade or frozen meals. Cooking Avoid adding salt when cooking. Use salt-free seasonings or herbs instead of table salt or sea salt. Check with your health care provider or pharmacist before using salt substitutes. Do not fry foods. Cook foods using healthy methods such as baking, boiling, grilling, and broiling instead. Cook with heart-healthy oils, such as olive, canola, soybean, or sunflower oil. Meal planning Eat a balanced diet that includes: 5 or more servings of fruits and vegetables each day. At each meal, try to fill half of your plate with fruits and vegetables. Up to 6-8 servings of whole grains each day. Less than 6 oz of lean meat, poultry, or fish each day. A 3-oz serving of meat is about the same size as a deck of cards. One egg equals 1 oz. 2 servings of low-fat dairy each day. A serving of nuts, seeds, or beans 5 times each week. Heart-healthy fats. Healthy fats called Omega-3 fatty acids are found in foods such as flaxseeds and coldwater fish, like sardines, salmon, and mackerel. Limit how much you eat of the following: Canned or prepackaged foods. Food that is high in trans fat, such as fried foods. Food that is high in saturated fat, such as fatty meat. Sweets, desserts, sugary drinks, and other foods with added sugar.  Full-fat dairy products. Do not salt foods before eating. Try to eat at least 2 vegetarian meals each week. Eat more home-cooked food and less restaurant, buffet, and fast food. When eating at a restaurant, ask that your food be prepared with less salt or no salt, if possible. What foods are recommended? The items listed may not be a complete list. Talk with your dietitian about what dietary choices are best for you. Grains Whole-grain or whole-wheat bread. Whole-grain or  whole-wheat pasta. Brown rice. Orpah Cobb. Bulgur. Whole-grain and low-sodium cereals. Pita bread. Low-fat, low-sodium crackers. Whole-wheat flour tortillas. Vegetables Fresh or frozen vegetables (raw, steamed, roasted, or grilled). Low-sodium or reduced-sodium tomato and vegetable juice. Low-sodium or reduced-sodium tomato sauce and tomato paste. Low-sodium or reduced-sodium canned vegetables. Fruits All fresh, dried, or frozen fruit. Canned fruit in natural juice (without added sugar). Meat and other protein foods Skinless chicken or [place]. Ground chicken or [place]. Pork with fat trimmed off. Fish and seafood. Egg whites. Dried beans, peas, or lentils. Unsalted nuts, nut butters, and seeds. Unsalted canned beans. Lean cuts of beef with fat trimmed off. Low-sodium, lean deli meat. Dairy Low-fat (1%) or fat-free (skim) milk. Fat-free, low-fat, or reduced-fat cheeses. Nonfat, low-sodium ricotta or cottage cheese. Low-fat or nonfat yogurt. Low-fat, low-sodium cheese. Fats and oils Soft margarine without trans fats. Vegetable oil. Low-fat, reduced-fat, or light mayonnaise and salad dressings (reduced-sodium). Canola, safflower, olive, soybean, and sunflower oils. Avocado. Seasoning and other foods Herbs. Spices. Seasoning mixes without salt. Unsalted popcorn and pretzels. Fat-free sweets. What foods are not recommended? The items listed may not be a complete list. Talk with your dietitian about what dietary choices are best for you. Grains Baked goods made with fat, such as croissants, muffins, or some breads. Dry pasta or rice meal packs. Vegetables Creamed or fried vegetables. Vegetables in a cheese sauce. Regular canned vegetables (not low-sodium or reduced-sodium). Regular canned tomato sauce and paste (not low-sodium or reduced-sodium). Regular tomato and vegetable juice (not low-sodium or reduced-sodium). Rosita Fire. Olives. Fruits Canned fruit in a light or heavy syrup. Fried fruit. Fruit  in cream or butter sauce. Meat and other protein foods Fatty cuts of meat. Ribs. Fried meat. Tomasa Blase. Sausage. Bologna and other processed lunch meats. Salami. Fatback. Hotdogs. Bratwurst. Salted nuts and seeds. Canned beans with added salt. Canned or smoked fish. Whole eggs or egg yolks. Chicken or [place] with skin. Dairy Whole or 2% milk, cream, and half-and-half. Whole or full-fat cream cheese. Whole-fat or sweetened yogurt. Full-fat cheese. Nondairy creamers. Whipped toppings. Processed cheese and cheese spreads. Fats and oils Butter. Stick margarine. Lard. Shortening. Ghee. Bacon fat. Tropical oils, such as coconut, palm kernel, or palm oil. Seasoning and other foods Salted popcorn and pretzels. Onion salt, garlic salt, seasoned salt, table salt, and sea salt. Worcestershire sauce. Tartar sauce. Barbecue sauce. Teriyaki sauce. Soy sauce, including reduced-sodium. Steak sauce. Canned and packaged gravies. Fish sauce. Oyster sauce. Cocktail sauce. Horseradish that you find on the shelf. Ketchup. Mustard. Meat flavorings and tenderizers. Bouillon cubes. Hot sauce and Tabasco sauce. Premade or packaged marinades. Premade or packaged taco seasonings. Relishes. Regular salad dressings. Where to find more information: National Heart, Lung, and Blood Institute: PopSteam.is American Heart Association: www.heart.org Summary The DASH eating plan is a healthy eating plan that has been shown to reduce high blood pressure (hypertension). It may also reduce your risk for type 2 diabetes, heart disease, and stroke. With the DASH eating plan, you should limit salt (sodium) intake to 2,300 mg  a day. If you have hypertension, you may need to reduce your sodium intake to 1,500 mg a day. When on the DASH eating plan, aim to eat more fresh fruits and vegetables, whole grains, lean proteins, low-fat dairy, and heart-healthy fats. Work with your health care provider or diet and nutrition specialist (dietitian) to  adjust your eating plan to your individual calorie needs. This information is not intended to replace advice given to you by your health care provider. Make sure you discuss any questions you have with your health care provider. Document Released: 11/24/2011 Document Revised: 11/17/2017 Document Reviewed: 11/28/2016 Elsevier Patient Education  2020 [employer].

## 2023-08-24 ENCOUNTER — Telehealth: Payer: Self-pay | Admitting: Rheumatology

## 2023-08-24 NOTE — Telephone Encounter (Signed)
Patient wanted [name] and [name] to know that she has decided not to take Imuran.

## 2023-08-30 ENCOUNTER — Telehealth (HOSPITAL_BASED_OUTPATIENT_CLINIC_OR_DEPARTMENT_OTHER): Payer: Self-pay | Admitting: Cardiovascular Disease

## 2023-08-30 NOTE — Telephone Encounter (Signed)
Patient states Dr. Duke Salvia ordered lab work for she went and had it done on 9/4 at the [facility] location on the 3rd floor, she states neither the order nor the results are on MyChart.  Please advise.

## 2023-08-30 NOTE — Telephone Encounter (Signed)
Per [facility],   Labs drawn 9/4, resulted 9/9,   Catecholamines- will follow  Metanephrines-  Normet- 89.5 Metan- <25 Cortisol- 16.6  Called [facility] to verify states of catecholamines, they are currently on a 7 day delay and results will cross over upon completion.   Returned call to patient to provide update.

## 2023-08-31 ENCOUNTER — Other Ambulatory Visit: Payer: Self-pay | Admitting: Internal Medicine

## 2023-08-31 NOTE — Telephone Encounter (Signed)
Last Fill: 06/09/2023  Eye exam: 05/24/2023 WNL   Labs: 06/16/2023 CBC WNL. Creatinine remains elevated but has improved-1.17 and GFR is low-48. Avoid the use of NSAIDs.  Potassium is borderline elevated-5.4. ESR WNL ANA remains positive dsDNA is negative  Next Visit: 11/22/2023  Last Visit: 06/16/2023  DX:Autoimmune disease   Current Dose per office note 06/16/2023: Hydroxychloroquine 200 mg p.o. twice daily Monday to Friday   Okay to refill Plaquenil?

## 2023-09-05 LAB — METANEPHRINES, PLASMA
Metanephrine, Free: <25 pg/mL (ref 0.0–88.0)
Normetanephrine, Free: 89.5 pg/mL (ref 0.0–285.2)

## 2023-09-05 LAB — CATECHOLAMINES, FRACTIONATED, PLASMA
Dopamine: <30 pg/mL (ref 0–48)
Epinephrine: <15 pg/mL (ref 0–62)
Norepinephrine: 651 pg/mL (ref 0–874)

## 2023-09-05 LAB — CORTISOL: Cortisol: 16.6 ug/dL (ref 6.2–19.4)

## 2023-09-06 ENCOUNTER — Encounter (HOSPITAL_BASED_OUTPATIENT_CLINIC_OR_DEPARTMENT_OTHER): Payer: Self-pay | Admitting: Cardiovascular Disease

## 2023-09-10 ENCOUNTER — Encounter: Payer: Self-pay | Admitting: Internal Medicine

## 2023-09-10 DIAGNOSIS — G479 Sleep disorder, unspecified: Secondary | ICD-10-CM | POA: Insufficient documentation

## 2023-09-10 NOTE — Patient Instructions (Addendum)
      Blood work was ordered.   The lab is on the first floor.    Medications changes include :       A referral was ordered and someone will call you to schedule an appointment.     Return in about 6 months (around 03/10/2024) for follow up, Schedule DEXA-[place].

## 2023-09-10 NOTE — Progress Notes (Unsigned)
Subjective:    Patient ID: [name], female    DOB: Sep 04, 1946, 77 y.o.   MRN: [id number]     HPI Jen is here for follow up of her chronic medical problems.  Takes tramadol prn-does not take often but it does help when her back pain is more severe.  Blood pressure variable at home.  She does have a follow-up with cardiology to review her measures over the past month and adjust medication if needed.   Medications and allergies reviewed with patient and updated if appropriate.  Current Outpatient Medications on File Prior to Visit  Medication Sig Dispense Refill   acetaminophen (TYLENOL) 500 MG tablet Take 1,000 mg by mouth daily as needed for mild pain.     amLODipine (NORVASC) 2.5 MG tablet Take 1 tablet (2.5 mg total) by mouth 2 (two) times daily. 180 tablet 2   CALCIUM PO Take by mouth daily.     cholecalciferol (VITAMIN D3) 25 MCG (1000 UNIT) tablet Take 1,000 Units by mouth daily.     CRANBERRY PO Take by mouth.     Cyanocobalamin (B-12 PO) Take by mouth.     eletriptan (RELPAX) 20 MG tablet TAKE 1 TABLET BY MOUTH EVERY 2 HOURS AS NEEDED FOR HEADACHE OR MIGRAINE. MAY REPEAT IN 2 HOURS IF HEADACHE PERSISTS OR RECURS. 9 tablet 1   hydroxychloroquine (PLAQUENIL) 200 MG tablet TAKE 1 TABLET BY MOUTH TWICE DAILY MONDAY THROUGH FRIDAY 120 tablet 0   losartan (COZAAR) 25 MG tablet TAKE 1 TABLET(25 MG) BY MOUTH TWICE DAILY 60 tablet 5   nebivolol (BYSTOLIC) 2.5 MG tablet TAKE 1 TABLET(2.5 MG) BY MOUTH DAILY 90 tablet 3   Probiotic Product (PROBIOTIC DAILY PO) Take by mouth.     traMADol (ULTRAM) 50 MG tablet Take 1 tablet (50 mg total) by mouth every 12 (twelve) hours as needed. 60 tablet 0   traZODone (DESYREL) 50 MG tablet TAKE 3 TABLETS(150 MG) BY MOUTH AT BEDTIME 270 tablet 1   No current facility-administered medications on file prior to visit.     Review of Systems  Constitutional:  Negative for fever.  Respiratory:  Negative for cough, shortness of  breath and wheezing.   Cardiovascular:  Positive for leg swelling (left ankle). Negative for chest pain and palpitations.  Neurological:  Negative for light-headedness and headaches.       Objective:   Vitals:   09/11/23 1325  BP: 116/72  Pulse: (!) 59  Temp: 97.9 F (36.6 C)  SpO2: 94%   BP Readings from Last 3 Encounters:  09/11/23 116/72  08/18/23 120/68  06/16/23 110/73   Wt Readings from Last 3 Encounters:  06/16/23 138 lb (62.6 kg)  04/05/23 138 lb (62.6 kg)  06/28/22 130 lb (59 kg)   There is no height or weight on file to calculate BMI.    Physical Exam Constitutional:      General: She is not in acute distress.    Appearance: Normal appearance.  HENT:     Head: Normocephalic and atraumatic.  Eyes:     Conjunctiva/sclera: Conjunctivae normal.  Cardiovascular:     Rate and Rhythm: Normal rate and regular rhythm.     Heart sounds: Normal heart sounds.  Pulmonary:     Effort: Pulmonary effort is normal. No respiratory distress.     Breath sounds: Normal breath sounds. No wheezing.  Musculoskeletal:     Cervical back: Neck supple.     Right lower leg: No edema.  Left lower leg: No edema.  Lymphadenopathy:     Cervical: No cervical adenopathy.  Skin:    General: Skin is warm and dry.     Findings: No rash.  Neurological:     Mental Status: She is alert. Mental status is at baseline.  Psychiatric:        Mood and Affect: Mood normal.        Behavior: Behavior normal.        Lab Results  Component Value Date   WBC 4.4 06/16/2023   HGB 11.9 06/16/2023   HCT 35.6 06/16/2023   PLT 232 06/16/2023   GLUCOSE 92 06/16/2023   CHOL 225 (H) 03/10/2023   TRIG 110.0 03/10/2023   HDL 76.90 03/10/2023   LDLDIRECT 122.6 06/03/2013   LDLCALC 126 (H) 03/10/2023   ALT 15 06/16/2023   AST 16 06/16/2023   NA 140 06/16/2023   K 5.4 (H) 06/16/2023   CL 108 06/16/2023   CREATININE 1.17 (H) 06/16/2023   BUN 24 06/16/2023   CO2 23 06/16/2023   TSH 2.29  03/10/2023   HGBA1C 5.2 03/06/2020     Assessment & Plan:   Flu vaccine given  See Problem List for Assessment and Plan of chronic medical problems.

## 2023-09-11 ENCOUNTER — Ambulatory Visit (INDEPENDENT_AMBULATORY_CARE_PROVIDER_SITE_OTHER): Payer: Medicare Other | Admitting: Internal Medicine

## 2023-09-11 VITALS — BP 116/72 | HR 59 | Temp 97.9°F

## 2023-09-11 DIAGNOSIS — G479 Sleep disorder, unspecified: Secondary | ICD-10-CM | POA: Diagnosis not present

## 2023-09-11 DIAGNOSIS — Z23 Encounter for immunization: Secondary | ICD-10-CM | POA: Diagnosis not present

## 2023-09-11 DIAGNOSIS — G8929 Other chronic pain: Secondary | ICD-10-CM

## 2023-09-11 DIAGNOSIS — G43909 Migraine, unspecified, not intractable, without status migrainosus: Secondary | ICD-10-CM

## 2023-09-11 DIAGNOSIS — I10 Essential (primary) hypertension: Secondary | ICD-10-CM

## 2023-09-11 DIAGNOSIS — M48062 Spinal stenosis, lumbar region with neurogenic claudication: Secondary | ICD-10-CM

## 2023-09-11 DIAGNOSIS — M25511 Pain in right shoulder: Secondary | ICD-10-CM

## 2023-09-11 DIAGNOSIS — E782 Mixed hyperlipidemia: Secondary | ICD-10-CM | POA: Diagnosis not present

## 2023-09-11 DIAGNOSIS — M810 Age-related osteoporosis without current pathological fracture: Secondary | ICD-10-CM

## 2023-09-11 NOTE — Assessment & Plan Note (Signed)
Chronic Cholesterol looks good - likely low risk Elevated ascvd risk, but coronary calcium score is 0 Would like to avoid medication Continue healthy diet, exercise Continue lifestyle control

## 2023-09-11 NOTE — Assessment & Plan Note (Signed)
Chronic Following with [name] Take Tylenol as needed For more severe pain will take tramadol 50 mg twice daily as needed

## 2023-09-11 NOTE — Assessment & Plan Note (Signed)
Chronic Intermittent radiculopathy Continue regular exercise Take Tylenol as needed For more severe pain will take tramadol 50 mg twice daily as needed

## 2023-09-11 NOTE — Assessment & Plan Note (Signed)
Chronic Controlled, Stable Continue trazodone 150 mg at bedtime

## 2023-09-11 NOTE — Assessment & Plan Note (Signed)
Chronic dexa due-ordered Exercising Continue calcium and vitamin d

## 2023-09-11 NOTE — Assessment & Plan Note (Signed)
Chronic Rarely has migraines Has Relpax 20 mg that she can take if needed

## 2023-09-11 NOTE — Assessment & Plan Note (Addendum)
Chronic Blood pressure well-controlled here Blood pressure variable at home Has seen Dr. Duke Salvia recently-no changes made Continue current medication for now --- amlodipine 2.5 mg bid, losartan  25 mg bid, bystolic 2.5 mg daily

## 2023-09-12 ENCOUNTER — Ambulatory Visit (INDEPENDENT_AMBULATORY_CARE_PROVIDER_SITE_OTHER)
Admission: RE | Admit: 2023-09-12 | Discharge: 2023-09-12 | Disposition: A | Discharge status: Home / Self Care | Payer: Medicare Other | Source: Ambulatory Visit

## 2023-09-12 DIAGNOSIS — M810 Age-related osteoporosis without current pathological fracture: Secondary | ICD-10-CM

## 2023-09-13 NOTE — Telephone Encounter (Signed)
All labs now resulted, please review

## 2023-09-18 ENCOUNTER — Encounter: Payer: Self-pay | Admitting: Pharmacist Clinician (PhC)/ Clinical Pharmacy Specialist

## 2023-09-18 ENCOUNTER — Ambulatory Visit
Payer: Medicare Other | Attending: Internal Medicine | Admitting: Pharmacist Clinician (PhC)/ Clinical Pharmacy Specialist

## 2023-09-18 VITALS — BP 158/87 | HR 58

## 2023-09-18 DIAGNOSIS — I10 Essential (primary) hypertension: Secondary | ICD-10-CM | POA: Insufficient documentation

## 2023-09-18 MED ORDER — LOSARTAN POTASSIUM 50 MG PO TABS
50.0000 mg | ORAL_TABLET | Freq: Two times a day (BID) | ORAL | 3 refills | Status: DC
Start: 2023-09-18 — End: 2023-12-21

## 2023-09-18 NOTE — Patient Instructions (Signed)
Follow up appointment: OCTOBER 30 AT 2:30 PM  Go to the lab in 2 WEEKS TO CHECK KIDNEY FUNCTION  Take your BP meds as follows:  STOP AMLODIPINE  INCREASE LOSARTAN TO 50 MG TWICE DAILY (TAKE 2 OF THE 25 MG TABLETS TWICE DAILY UNTIL GONE)  CONTINUE NEBIVOLOL FOR NOW  Check your blood pressure at home daily (if able) and keep record of the readings.  Hypertension "High blood pressure"  Hypertension is often called "The Silent Killer." It rarely causes symptoms until it is extremely  high or has done damage to other organs in the body. For this reason, you should have your  blood pressure checked regularly by your physician. We will check your blood pressure  every time you see a provider at one of our offices.   Your blood pressure reading consists of two numbers. Ideally, blood pressure should be  below 120/80. The first ("top") number is called the systolic pressure. It measures the  pressure in your arteries as your heart beats. The second ("bottom") number is called the diastolic pressure. It measures the pressure in your arteries as the heart relaxes between beats.  The benefits of getting your blood pressure under control are enormous. A 10-point  reduction in systolic blood pressure can reduce your risk of stroke by 27% and heart failure by 28%  Your blood pressure goal is <130/80  To check your pressure at home you will need to:  1. Sit up in a chair, with feet flat on the floor and back supported. Do not cross your ankles or legs. 2. Rest your left arm so that the cuff is about heart level. If the cuff goes on your upper arm,  then just relax the arm on the table, arm of the chair or your lap. If you have a wrist cuff, we  suggest relaxing your wrist against your chest (think of it as Pledging the Flag with the  wrong arm).  3. Place the cuff snugly around your arm, about 1 inch above the crook of your elbow. The  cords should be inside the groove of your elbow.  4. Sit  quietly, with the cuff in place, for about 5 minutes. After that 5 minutes press the power  button to start a reading. 5. Do not talk or move while the reading is taking place.  6. Record your readings on a sheet of paper. Although most cuffs have a memory, it is often  easier to see a pattern developing when the numbers are all in front of you.  7. You can repeat the reading after 1-3 minutes if it is recommended  Make sure your bladder is empty and you have not had caffeine or tobacco within the last 30 min  Always bring your blood pressure log with you to your appointments. If you have not brought your monitor in to be double checked for accuracy, please bring it to your next appointment.  You can find a list of quality blood pressure cuffs at validatebp.org

## 2023-09-18 NOTE — Assessment & Plan Note (Signed)
Assessment: BP is uncontrolled in office BP 162/99 mmHg;  above the goal (<130/80). LEE transient, probably not due to amlodipine Tolerates current medications well without any side effects Denies SOB, palpitation, chest pain, headaches, Would like to minimize number of medicatons Reiterated the importance of regular exercise and low salt diet   Plan:  Stop taking amlodipine  Increase losartan to 50 mg twice daily Continue taking nebivolol 2.5 mg twice daily Patient to keep record of BP readings with heart rate and report to [place] at the next visit Patient to follow up with PharmD in 1 month  Labs ordered today:  BMET

## 2023-09-18 NOTE — Progress Notes (Signed)
Office Visit    Patient Name: [name] Date of Encounter: 09/18/2023  Primary Care Provider:  Pincus Sanes, MD Primary Cardiologist:  None  Chief Complaint    Hypertension - Advanced hypertension clinic  Past Medical History   LEE Occasional per patient, worse on left  CKD Stage 3a, GFR 48 (6/24) on losartan  HLD 3/24 LDL 126, no current medications  migraine Couple of times per year, has Relpax    Allergies  Allergen Reactions   Morphine Hives and Palpitations     Because of a history of documented adverse serious drug reaction;Medi Alert bracelet  is recommended   Albuterol Palpitations    REACTION: heart racing   Augmentin [Amoxicillin-Pot Clavulanate]     Abdominal cramping   Ciprofloxacin     Abdominal cramping   Fish Allergy Other (See Comments)    Severe headache   Gabapentin     Headaches w/ higher doses   Risedronate Sodium     Gi symptoms   Cyclobenzaprine Other (See Comments)    headaches   Ibandronate Sodium     Stomach cramps    History of Present Illness    [name] is a 77 y.o. female patient who was referred to the [facility] by Dr. Cheryll Cockayne.  Patient reported fluctuations in blood pressure for some time when she saw Dr. Duke Salvia, however readings in Epic have generally been WNL.  Dr. Duke Salvia suggested consistent BP tracking for a few weeks and checked cortisol and pheochromocytoma labs.  All came back WNL.    Today she returns for follow up.  Notes that her exercise is limited by back pain, and she has been trying various means of treating to try and avoid surgery.  Was doing dry needling, but stopped a few weeks back, just wanted to take a break from it, although it was beneficial.  Currently takes APAP 1 gm bid and tramadol as needed.  Notes today that her pain level is a 6/10, whereas baseline is currently 4-5.  She doesn't think that her days with elevated BP are related to pain and notes that she really  doesn't have much stress.  Has had a couple of readings < 110 systolic, at which times she states feeling washed out and tired.  Lower extremity edema is still present off and on, cannot find a trigger for it either.   She is also concerned with the number of medications she takes daily, as she would like to minimize this.    Blood Pressure Goal:  130/80  Current Medications: amlodipine 2.5 mg bid, losartan 25 mg bid, nebivolol 2.5 mg every day   Family Hx: both parents (father died at 85, first at 86)  ; mother with hypertension, died at 36; 1 brother healthy, other with AF; 2 children, no hypertension  Social Hx:      Tobacco: no  Alcohol: no  Caffeine: 1 coffee daily  Diet:   mostly home cooke meals, about once weekly; not much beef, plenty of chicken, fish allergy; farm fresh vegetables; doesn't snack much, no processed foods, no added salt   Exercise: walks most days, fitness class at [employer] once weekly (Sliver Sneakers type class)  Home BP readings:      AM 31 readings average 129/81  HR 67  (range 100-166/69-100)  PM 28 readings average 131/81  HR 68  (range 106-156/55-90)   Accessory Clinical Findings    Lab Results  Component Value Date  CREATININE 1.17 (H) 06/16/2023   BUN 24 06/16/2023   NA 140 06/16/2023   K 5.4 (H) 06/16/2023   CL 108 06/16/2023   CO2 23 06/16/2023   Lab Results  Component Value Date   ALT 15 06/16/2023   AST 16 06/16/2023   ALKPHOS 65 03/10/2023   BILITOT 0.3 06/16/2023   Lab Results  Component Value Date   HGBA1C 5.2 03/06/2020    Screening for Secondary Hypertension:      08/18/2023    3:03 PM  Causes  Drugs/Herbals Screened     - Comments No NSAIDS.  Minimal caffeine.  No EtOH.  No supplements.  Renovascular HTN Screened     - Comments check renal Dopplers  Sleep Apnea Screened     - Comments No snoring.  Thyroid Disease Screened  Hyperaldosteronism N/A  Pheochromocytoma Screened     - Comments check  catecholamin\es/metanephrines  Cushing's Syndrome N/A  Hyperparathyroidism Screened  Coarctation of the Aorta Screened     - Comments BP symmetric    Relevant Labs/Studies:    Latest Ref Rng & Units 06/16/2023   11:00 AM 03/10/2023    1:52 PM 09/22/2022    3:20 PM  Basic Labs  Sodium 135 - 146 mmol/L 140  140  139   Potassium 3.5 - 5.3 mmol/L 5.4  4.9  4.8   Creatinine 0.60 - 1.00 mg/dL 1.61  0.96  0.45        Latest Ref Rng & Units 03/10/2023    1:52 PM 04/11/2022   11:45 AM  Thyroid   TSH 0.35 - 5.50 uIU/mL 2.29  3.67           Latest Ref Rng & Units 08/23/2023   10:29 AM  Metanephrines/Catecholamines   Epinephrine 0 - 62 pg/mL <15   Norepinephrine 0 - 874 pg/mL 651   Dopamine 0 - 48 pg/mL <30   Metanephrines 0.0 - 88.0 pg/mL <25.0   Normetanephrines  0.0 - 285.2 pg/mL 89.5             Home Medications    Current Outpatient Medications  Medication Sig Dispense Refill   losartan (COZAAR) 50 MG tablet Take 1 tablet (50 mg total) by mouth 2 (two) times daily. 60 tablet 3   acetaminophen (TYLENOL) 500 MG tablet Take 1,000 mg by mouth daily as needed for mild pain.     amLODipine (NORVASC) 2.5 MG tablet Take 1 tablet (2.5 mg total) by mouth 2 (two) times daily. 180 tablet 2   CALCIUM PO Take by mouth daily.     cholecalciferol (VITAMIN D3) 25 MCG (1000 UNIT) tablet Take 1,000 Units by mouth daily.     CRANBERRY PO Take by mouth.     Cyanocobalamin (B-12 PO) Take by mouth.     eletriptan (RELPAX) 20 MG tablet TAKE 1 TABLET BY MOUTH EVERY 2 HOURS AS NEEDED FOR HEADACHE OR MIGRAINE. MAY REPEAT IN 2 HOURS IF HEADACHE PERSISTS OR RECURS. 9 tablet 1   hydroxychloroquine (PLAQUENIL) 200 MG tablet TAKE 1 TABLET BY MOUTH TWICE DAILY MONDAY THROUGH FRIDAY 120 tablet 0   nebivolol (BYSTOLIC) 2.5 MG tablet TAKE 1 TABLET(2.5 MG) BY MOUTH DAILY 90 tablet 3   Probiotic Product (PROBIOTIC DAILY PO) Take by mouth.     traMADol (ULTRAM) 50 MG tablet Take 1 tablet (50 mg total) by mouth  every 12 (twelve) hours as needed. 60 tablet 0   traZODone (DESYREL) 50 MG tablet TAKE 3 TABLETS(150 MG) BY MOUTH AT  BEDTIME 270 tablet 1   No current facility-administered medications for this visit.     Assessment & Plan   HYPERTENSION CONTROL Vitals:   09/18/23 1333 09/18/23 1339  BP: (!) 162/99 (!) 158/87    The patient's blood pressure is elevated above target today.  In order to address the patient's elevated BP: A current anti-hypertensive medication was adjusted today.; Blood pressure will be monitored at home to determine if medication changes need to be made.      Hypertension Assessment: BP is uncontrolled in office BP 162/99 mmHg;  above the goal (<130/80). LEE transient, probably not due to amlodipine Tolerates current medications well without any side effects Denies SOB, palpitation, chest pain, headaches, Would like to minimize number of medicatons Reiterated the importance of regular exercise and low salt diet   Plan:  Stop taking amlodipine  Increase losartan to 50 mg twice daily Continue taking nebivolol 2.5 mg twice daily Patient to keep record of BP readings with heart rate and report to [place] at the next visit Patient to follow up with PharmD in 1 month  Labs ordered today:  BMET   Phillips Hay PharmD CPP [facility]  [address] [place], [place] [address] [phone]

## 2023-09-20 NOTE — Telephone Encounter (Signed)
September 20, 2023 Chilton Si, MD  to Marlene Lard, RN  Me     09/20/23  4:37 PM Catecholamines, metanephrines and cortisol were all within normal limits.  Tiffany C. Duke Salvia, MD, [facility]  Advised patient of lab results

## 2023-10-01 ENCOUNTER — Other Ambulatory Visit: Payer: Self-pay | Admitting: Internal Medicine

## 2023-10-03 LAB — BASIC METABOLIC PANEL
BUN/Creatinine Ratio: 19 (ref 12–28)
BUN: 26 mg/dL (ref 8–27)
CO2: 20 mmol/L (ref 20–29)
Calcium: 8.9 mg/dL (ref 8.7–10.3)
Chloride: 109 mmol/L — ABNORMAL HIGH (ref 96–106)
Creatinine, Ser: 1.34 mg/dL — ABNORMAL HIGH (ref 0.57–1.00)
Glucose: 113 mg/dL — ABNORMAL HIGH (ref 70–99)
Potassium: 4.5 mmol/L (ref 3.5–5.2)
Sodium: 141 mmol/L (ref 134–144)
eGFR: 41 mL/min/{1.73_m2} — ABNORMAL LOW (ref >59)

## 2023-10-03 LAB — SPECIMEN STATUS REPORT

## 2023-10-06 ENCOUNTER — Telehealth (HOSPITAL_BASED_OUTPATIENT_CLINIC_OR_DEPARTMENT_OTHER): Payer: Self-pay

## 2023-10-06 DIAGNOSIS — I10 Essential (primary) hypertension: Secondary | ICD-10-CM

## 2023-10-06 NOTE — Telephone Encounter (Addendum)
Results called to patient who verbalizes understanding!    ----- Message from [facility] sent at 10/06/2023  7:15 AM EDT ----- Mild change in the creatinine, but this is to be expected with losartan.  It is actually a protective medication for the kidneys.  Recommend repeating a basic metabolic panel in a few months to make sure that it is stable.

## 2023-10-17 ENCOUNTER — Ambulatory Visit (INDEPENDENT_AMBULATORY_CARE_PROVIDER_SITE_OTHER): Payer: Medicare Other | Admitting: Internal Medicine

## 2023-10-17 VITALS — BP 134/76 | HR 75 | Temp 98.1°F | Ht 64.0 in

## 2023-10-17 DIAGNOSIS — N3000 Acute cystitis without hematuria: Secondary | ICD-10-CM

## 2023-10-17 DIAGNOSIS — I10 Essential (primary) hypertension: Secondary | ICD-10-CM | POA: Diagnosis not present

## 2023-10-17 MED ORDER — CEPHALEXIN 500 MG PO CAPS: 500.0000 mg | ORAL_CAPSULE | Freq: Two times a day (BID) | ORAL | 0 refills | Status: DC

## 2023-10-17 MED ORDER — ONDANSETRON 4 MG PO TBDP: 4.0000 mg | ORAL_TABLET | Freq: Three times a day (TID) | ORAL | 0 refills | Status: DC | PRN

## 2023-10-17 MED ORDER — CEFTRIAXONE SODIUM 1 G IJ SOLR
1.0000 g | Freq: Once | INTRAMUSCULAR | Status: AC
Start: 2023-10-17 — End: 2023-10-17
  Administered 2023-10-17: 1 g via INTRAMUSCULAR

## 2023-10-17 MED ORDER — CEFTRIAXONE SODIUM 1 G IJ SOLR
1.0000 g | Freq: Once | INTRAMUSCULAR | Status: DC
Start: 2023-10-17 — End: 2023-10-17

## 2023-10-17 NOTE — Assessment & Plan Note (Signed)
Chronic Blood pressure well Continue  amlodipine 2.5 mg bid, losartan 50 mg bid, bystolic 2.5 mg daily

## 2023-10-17 NOTE — Progress Notes (Signed)
Subjective:    Patient ID: [name], female    DOB: 1946/05/30, 77 y.o.   MRN: [id number]      HPI Cheetara is here for  Chief Complaint  Patient presents with   Urinary Tract Infection    Started on Sunday with pain and pressure   Last week had 4 days of diarrhea.  She wonders if that may have started this.  ? UTI:  Her symptoms started 2  days ago.  She states dysuria, urinary frequency, urinary urgency, abdominal pain and bloating with and without eating.  The abdominal pain and discomfort and bloating is the most significant symptom.  She has not been eating or drinking-she wants to avoid going to the bathroom.  Temp 101 last night.  Has had some mild nausea  She denies hematuria, back pain   Last urine cultures:  07/2022-no growth  08/2021 E. coli, susceptible to everything except for nitrofurantoin  01/2021-E. coli, susceptible to Keflex, ceftriaxone, Cipro, nitrofurantoin   Medications and allergies reviewed with patient and updated if appropriate.  Current Outpatient Medications on File Prior to Visit  Medication Sig Dispense Refill   acetaminophen (TYLENOL) 500 MG tablet Take 1,000 mg by mouth daily as needed for mild pain.     amLODipine (NORVASC) 2.5 MG tablet Take 1 tablet (2.5 mg total) by mouth 2 (two) times daily. 180 tablet 2   CALCIUM PO Take by mouth daily.     cholecalciferol (VITAMIN D3) 25 MCG (1000 UNIT) tablet Take 1,000 Units by mouth daily.     CRANBERRY PO Take by mouth.     Cyanocobalamin (B-12 PO) Take by mouth.     eletriptan (RELPAX) 20 MG tablet TAKE 1 TABLET BY MOUTH EVERY 2 HOURS AS NEEDED FOR HEADACHE OR MIGRAINE. MAY REPEAT IN 2 HOURS IF HEADACHE PERSISTS OR RECURS. 9 tablet 1   hydroxychloroquine (PLAQUENIL) 200 MG tablet TAKE 1 TABLET BY MOUTH TWICE DAILY MONDAY THROUGH FRIDAY 120 tablet 0   losartan (COZAAR) 50 MG tablet Take 1 tablet (50 mg total) by mouth 2 (two) times daily. 60 tablet 3   nebivolol (BYSTOLIC) 2.5 MG  tablet TAKE 1 TABLET(2.5 MG) BY MOUTH DAILY 90 tablet 3   Probiotic Product (PROBIOTIC DAILY PO) Take by mouth.     traMADol (ULTRAM) 50 MG tablet Take 1 tablet (50 mg total) by mouth every 12 (twelve) hours as needed. 60 tablet 0   traZODone (DESYREL) 50 MG tablet TAKE 3 TABLETS(150 MG) BY MOUTH AT BEDTIME 270 tablet 1   No current facility-administered medications on file prior to visit.    Review of Systems  Gastrointestinal:  Negative for diarrhea.       Objective:   Vitals:   10/17/23 1421  BP: 134/76  Pulse: 75  Temp: 98.1 F (36.7 C)  SpO2: 95%   BP Readings from Last 3 Encounters:  10/17/23 134/76  09/18/23 (!) 158/87  09/11/23 116/72   Wt Readings from Last 3 Encounters:  06/16/23 138 lb (62.6 kg)  04/05/23 138 lb (62.6 kg)  06/28/22 130 lb (59 kg)   Body mass index is 23.69 kg/m.    Physical Exam Constitutional:      General: She is not in acute distress.    Appearance: Normal appearance. She is not ill-appearing.  HENT:     Head: Normocephalic.  Eyes:     Conjunctiva/sclera: Conjunctivae normal.  Abdominal:     General: There is no distension.     Palpations:  Abdomen is soft.     Tenderness: There is abdominal tenderness (Abdomen very tender to even light palpation across lower abdomen). There is no right CVA tenderness, left CVA tenderness, guarding or rebound.  Skin:    General: Skin is warm and dry.  Neurological:     Mental Status: She is alert.            Assessment & Plan:    See Problem List for Assessment and Plan of chronic medical problems.

## 2023-10-17 NOTE — Assessment & Plan Note (Signed)
Acute Symptoms consistent with possible UTI Her symptoms are concerning with the significant abdominal pain and fever last night Was not able to give a urine sample and will try to get 1 on her way out-UA, urine culture Ceftriaxone 1 g IM x 1 Start Keflex 500 mg twice daily x 7 days starting tomorrow Advised to start Azo bladder pain-Pyridium Zofran 4 mg ODT every 8 hours as needed Advised that she needs to call if her symptoms or not improving or worsening

## 2023-10-17 NOTE — Patient Instructions (Addendum)
   Give a urine sample downstairs.   You received an antibiotic injection today.    Medications changes include :   start keflex twice a day tomorrow.   Zofran for nausea.    Start AZO - bladder pain medication.     Return if symptoms worsen or fail to improve.

## 2023-10-18 ENCOUNTER — Ambulatory Visit
Payer: Medicare Other | Attending: Internal Medicine | Admitting: Pharmacist Clinician (PhC)/ Clinical Pharmacy Specialist

## 2023-10-18 ENCOUNTER — Encounter: Payer: Self-pay | Admitting: Pharmacist Clinician (PhC)/ Clinical Pharmacy Specialist

## 2023-10-18 VITALS — BP 104/68 | HR 74

## 2023-10-18 DIAGNOSIS — I10 Essential (primary) hypertension: Secondary | ICD-10-CM | POA: Diagnosis present

## 2023-10-18 DIAGNOSIS — R1030 Lower abdominal pain, unspecified: Secondary | ICD-10-CM

## 2023-10-18 DIAGNOSIS — R339 Retention of urine, unspecified: Secondary | ICD-10-CM

## 2023-10-18 LAB — URINALYSIS, ROUTINE W REFLEX MICROSCOPIC
Bilirubin Urine: NEGATIVE
Hgb urine dipstick: NEGATIVE
Ketones, ur: NEGATIVE
Leukocytes,Ua: NEGATIVE
Nitrite: NEGATIVE
RBC / HPF: NONE SEEN (ref 0–?)
Specific Gravity, Urine: 1.025 (ref 1.000–1.030)
Urine Glucose: NEGATIVE
Urobilinogen, UA: 0.2 (ref 0.0–1.0)
pH: 6 (ref 5.0–8.0)

## 2023-10-18 LAB — URINE CULTURE: Result:: NO GROWTH

## 2023-10-18 NOTE — Patient Instructions (Signed)
Follow up appointment: reach out should you note your blood pressure average > 140 systolic on multiple occasions  Take your BP meds as follows:  Continue with your current medications.   Message me through MyChart (or call me at [phone]) and confirm whether you are taking the amlodipine 2.5 mg twice daily or nebivolol 2.5 mg twice daily  Check your blood pressure at home daily (if able) and keep record of the readings.  Hypertension "High blood pressure"  Hypertension is often called "The Silent Killer." It rarely causes symptoms until it is extremely  high or has done damage to other organs in the body. For this reason, you should have your  blood pressure checked regularly by your physician. We will check your blood pressure  every time you see a provider at one of our offices.   Your blood pressure reading consists of two numbers. Ideally, blood pressure should be  below 120/80. The first ("top") number is called the systolic pressure. It measures the  pressure in your arteries as your heart beats. The second ("bottom") number is called the diastolic pressure. It measures the pressure in your arteries as the heart relaxes between beats.  The benefits of getting your blood pressure under control are enormous. A 10-point  reduction in systolic blood pressure can reduce your risk of stroke by 27% and heart failure by 28%  Your blood pressure goal is 130/80  To check your pressure at home you will need to:  1. Sit up in a chair, with feet flat on the floor and back supported. Do not cross your ankles or legs. 2. Rest your left arm so that the cuff is about heart level. If the cuff goes on your upper arm,  then just relax the arm on the table, arm of the chair or your lap. If you have a wrist cuff, we  suggest relaxing your wrist against your chest (think of it as Pledging the Flag with the  wrong arm).  3. Place the cuff snugly around your arm, about 1 inch above the crook of  your elbow. The  cords should be inside the groove of your elbow.  4. Sit quietly, with the cuff in place, for about 5 minutes. After that 5 minutes press the power  button to start a reading. 5. Do not talk or move while the reading is taking place.  6. Record your readings on a sheet of paper. Although most cuffs have a memory, it is often  easier to see a pattern developing when the numbers are all in front of you.  7. You can repeat the reading after 1-3 minutes if it is recommended  Make sure your bladder is empty and you have not had caffeine or tobacco within the last 30 min  Always bring your blood pressure log with you to your appointments. If you have not brought your monitor in to be double checked for accuracy, please bring it to your next appointment.  You can find a list of quality blood pressure cuffs at validatebp.org

## 2023-10-18 NOTE — Progress Notes (Addendum)
Office Visit    Patient Name: [name] Date of Encounter: 10/18/2023  Primary Care Provider:  Pincus Sanes, MD Primary Cardiologist:  None  Chief Complaint    Hypertension - Advanced hypertension clinic  Past Medical History   LEE Occasional per patient, worse on left  CKD Stage 3a, GFR 48 (6/24) on losartan  HLD 3/24 LDL 126, no current medications  migraine Couple of times per year, has Relpax    Allergies  Allergen Reactions   Morphine Hives and Palpitations     Because of a history of documented adverse serious drug reaction;Medi Alert bracelet  is recommended   Albuterol Palpitations    REACTION: heart racing   Augmentin [Amoxicillin-Pot Clavulanate]     Abdominal cramping   Ciprofloxacin     Abdominal cramping   Fish Allergy Other (See Comments)    Severe headache   Gabapentin     Headaches w/ higher doses   Risedronate Sodium     Gi symptoms   Cyclobenzaprine Other (See Comments)    headaches   Ibandronate Sodium     Stomach cramps    History of Present Illness    [name] is a 77 y.o. female patient who was referred to the Advanced Hypertension Clinic by Dr. Cheryll Cockayne.  Patient reported fluctuations in blood pressure for some time when she saw Dr. Duke Salvia, however readings in Epic have generally been WNL.  Dr. Duke Salvia suggested consistent BP tracking for a few weeks and checked cortisol and pheochromocytoma labs.  All came back WNL.    When I saw her in September she noted that her exercise limited by back pain, and she has been trying various means of treating to try and avoid surgery.  She doesn't think that her days with elevated BP are related to pain and notes that she really doesn't have much stress.  Has had a couple of readings < 110 systolic, at which times she states feeling washed out and tired.  Lower extremity edema is still present off and on, cannot find a trigger for it either.   She is also concerned with the number  of medications she takes daily, as she would like to minimize this.    Today she returns for follow up.  At her last visit we stopped the amlodipine and increased the losartan to 50 mg twice daily.   She is confused, as she thought she was to stop the nebivolol and continue the amlodipine.  Assured her repeatedly that it didn't matter if we stopped the wrong one, as we were merely trying to simplify her medication regimen, and either med would have a similar effect.  She is also currently being treated for a UTI, and was started on cephalexin 500 mg bid yesterday, as well as getting a dose of ceftriaxone in the office.    Blood Pressure Goal:  130/80  Current Medications: amlodipine 2.5 mg bid, losartan 25 mg bid, nebivolol 2.5 mg every day   Family Hx: both parents (father died at 63, first at 95)  ; mother with hypertension, died at 40; 1 brother healthy, other with AF; 2 children, no hypertension  Social Hx:      Tobacco: no  Alcohol: no  Caffeine: 1 coffee daily  Diet:   mostly home cooked meals, about once weekly; not much beef, plenty of chicken, fish allergy; farm fresh vegetables; doesn't snack much, no processed foods, no added salt   Exercise: walks most days,  fitness class at [employer] once weekly (Sliver [employer]ne[employer] type class)  Home BP readings:      AM 31 readings average 129/81  HR 67  (range 100-166/69-100)  PM 28 readings average 131/81  HR 68  (range 106-156/55-90)   Accessory Clinical Findings    Lab Results  Component Value Date   CREATININE 1.34 (H) 10/02/2023   BUN 26 10/02/2023   NA 141 10/02/2023   K 4.5 10/02/2023   CL 109 (H) 10/02/2023   CO2 20 10/02/2023   Lab Results  Component Value Date   ALT 15 06/16/2023   AST 16 06/16/2023   ALKPHOS 65 03/10/2023   BILITOT 0.3 06/16/2023   Lab Results  Component Value Date   HGBA1C 5.2 03/06/2020    Screening for Secondary Hypertension:      08/18/2023    3:03 PM  Causes  Drugs/Herbals Screened     -  Comments No NSAIDS.  Minimal caffeine.  No EtOH.  No supplements.  Renovascular HTN Screened     - Comments check renal Dopplers  Sleep Apnea Screened     - Comments No snoring.  Thyroid Disease Screened  Hyperaldosteronism N/A  Pheochromocytoma Screened     - Comments check catecholamin\es/metanephrines  Cushing's Syndrome N/A  Hyperparathyroidism Screened  Coarctation of the Aorta Screened     - Comments BP symmetric    Relevant Labs/Studies:    Latest Ref Rng & Units 10/02/2023   12:00 AM 06/16/2023   11:00 AM 03/10/2023    1:52 PM  Basic Labs  Sodium 134 - 144 mmol/L 141  140  140   Potassium 3.5 - 5.2 mmol/L 4.5  5.4  4.9   Creatinine 0.57 - 1.00 mg/dL 1.61  0.96  0.45        Latest Ref Rng & Units 03/10/2023    1:52 PM 04/11/2022   11:45 AM  Thyroid   TSH 0.35 - 5.50 uIU/mL 2.29  3.67           Latest Ref Rng & Units 08/23/2023   10:29 AM  Metanephrines/Catecholamines   Epinephrine 0 - 62 pg/mL <15   Norepinephrine 0 - 874 pg/mL 651   Dopamine 0 - 48 pg/mL <30   Metanephrines 0.0 - 88.0 pg/mL <25.0   Normetanephrines  0.0 - 285.2 pg/mL 89.5             Home Medications    Current Outpatient Medications  Medication Sig Dispense Refill   acetaminophen (TYLENOL) 500 MG tablet Take 1,000 mg by mouth daily as needed for mild pain.     amLODipine (NORVASC) 2.5 MG tablet Take 1 tablet (2.5 mg total) by mouth 2 (two) times daily. 180 tablet 2   CALCIUM PO Take by mouth daily.     cephALEXin (KEFLEX) 500 MG capsule Take 1 capsule (500 mg total) by mouth 2 (two) times daily. 14 capsule 0   cholecalciferol (VITAMIN D3) 25 MCG (1000 UNIT) tablet Take 1,000 Units by mouth daily.     CRANBERRY PO Take by mouth.     Cyanocobalamin (B-12 PO) Take by mouth.     eletriptan (RELPAX) 20 MG tablet TAKE 1 TABLET BY MOUTH EVERY 2 HOURS AS NEEDED FOR HEADACHE OR MIGRAINE. MAY REPEAT IN 2 HOURS IF HEADACHE PERSISTS OR RECURS. 9 tablet 1   hydroxychloroquine (PLAQUENIL) 200 MG tablet  TAKE 1 TABLET BY MOUTH TWICE DAILY MONDAY THROUGH FRIDAY 120 tablet 0   losartan (COZAAR) 50 MG tablet Take 1 tablet (  50 mg total) by mouth 2 (two) times daily. 60 tablet 3   nebivolol (BYSTOLIC) 2.5 MG tablet TAKE 1 TABLET(2.5 MG) BY MOUTH DAILY 90 tablet 3   ondansetron (ZOFRAN-ODT) 4 MG disintegrating tablet Take 1 tablet (4 mg total) by mouth every 8 (eight) hours as needed for nausea or vomiting. 20 tablet 0   Probiotic Product (PROBIOTIC DAILY PO) Take by mouth.     traMADol (ULTRAM) 50 MG tablet Take 1 tablet (50 mg total) by mouth every 12 (twelve) hours as needed. 60 tablet 0   traZODone (DESYREL) 50 MG tablet TAKE 3 TABLETS(150 MG) BY MOUTH AT BEDTIME 270 tablet 1   No current facility-administered medications for this visit.     Assessment & Plan       Hypertension Assessment: BP is controlled in office BP 104/68 mmHg. Confusion about whether she stopped the amlodipine 2.5 mg bid or nebivolol 2.5 mg bid.  She will reach out to [place] later today to confirm which she is actually taking Denies SOB, palpitation, chest pain, headaches,or swelling Currently on antibiotic for UTI (cephalexin 500 bid x 7 days) Reiterated the importance of regular exercise and low salt diet   Plan:  Continue taking losartan 50 mg Pt to call with other medication, we will correct/update chart accordingly. Patient to keep record of BP readings with heart rate and report to [place] at the next visit Patient to follow up with PCP, let us know if your home BP readings elevate to > 140 systolic on multiple occasions.    Phillips Hay PharmD CPP [facility]  [address] [place], [place] [address] [phone]   Addendum:  Patient sent MyChart message when she got home and reported that she has apparently stopped both the amlodipine and nebivolol.  Pressure readings are good, will have her continue with just losartan 50 mg bid.  [facility]

## 2023-10-18 NOTE — Addendum Note (Signed)
Addended by: Rosalee Kaufman on: 10/18/2023 04:16 PM   Modules accepted: Orders

## 2023-10-18 NOTE — Assessment & Plan Note (Signed)
Assessment: BP is controlled in office BP 104/68 mmHg. Confusion about whether she stopped the amlodipine 2.5 mg bid or nebivolol 2.5 mg bid.  She will reach out to [place] later today to confirm which she is actually taking Denies SOB, palpitation, chest pain, headaches,or swelling Currently on antibiotic for UTI (cephalexin 500 bid x 7 days) Reiterated the importance of regular exercise and low salt diet   Plan:  Continue taking losartan 50 mg Pt to call with other medication, we will correct/update chart accordingly. Patient to keep record of BP readings with heart rate and report to [place] at the next visit Patient to follow up with PCP, let us know if your home BP readings elevate to > 140 systolic on multiple occasions.

## 2023-10-18 NOTE — Telephone Encounter (Signed)
I spoke with patient.  I did recommend that she go to the emergency room for evaluation-consistent on with bladder retention.  Urinalysis without evidence of UTI-culture pending.  She is not having improvement with antibiotics.  She is not able to go to the emergency room-her husband just had a procedure.  Discussed that if the pain does get more severe she needs to go to the emergency room.  Stat CT abdomen and pelvis ordered.  Stat neurology referral ordered.

## 2023-10-18 NOTE — Telephone Encounter (Signed)
Reached out to pt, Pt stating she is feeling the same and also gave another number to get in touch with her as well. [phone]

## 2023-10-23 ENCOUNTER — Ambulatory Visit: Payer: Medicare Other | Admitting: Urology

## 2023-10-27 ENCOUNTER — Ambulatory Visit (HOSPITAL_BASED_OUTPATIENT_CLINIC_OR_DEPARTMENT_OTHER): Payer: Medicare Other

## 2023-11-08 NOTE — Progress Notes (Unsigned)
Office Visit Note  Patient: [name]             Date of Birth: 06-05-1946           MRN: [id number]             PCP: Pincus Sanes, MD Referring: Pincus Sanes, MD Visit Date: 11/22/2023 Occupation: @GUAROCC @  Subjective:  Discuss starting imuran   History of Present Illness: [name] is a 77 y.o. female with history of autoimmune disease and osteoarthritis.  Patient remains on plaquenil 200 mg p.o. twice daily Monday to Friday.  She is tolerating Plaquenil without any side effects and has not had any gaps in therapy.  Patient continues to have chronic pain and inflammation involving multiple joints.  She has ongoing tenderness and swelling in the right second MCP joint as well as inflammation in her left knee joint.  Patient states that she is open to discussing initiating Imuran as previously discussed.  Patient states she recently had an episode of Raynaud's phenomenon lasting for about 4 days.  She has noticed increased frequency of Raynaud's with cooler weather temperatures.  She is also no longer taking amlodipine.  Her blood pressure has been significantly elevated this past week.  She has not yet reached out to her cardiologist or PCP.  She remains on losartan as prescribed.  Patient continues to have chronic sicca symptoms.  She had intolerance to his eyedrop but is using eyedrops recommended by her ophthalmologist which has been helpful.  She has been using Biotene oral rinse for dry mouth.  Patient continues to see the dentist every 6 months and has an appointment this afternoon.  She has not had any oral or nasal ulcerations.  She denies any recent rashes.   Activities of Daily Living:  Patient reports morning stiffness for 2 hours.   Patient Denies nocturnal pain.  Difficulty dressing/grooming: Reports Difficulty climbing stairs: Reports Difficulty getting out of chair: Reports Difficulty using hands for taps, buttons, cutlery, and/or writing:  Reports  Review of Systems  Constitutional:  Positive for fatigue.  HENT:  Positive for mouth dryness. Negative for mouth sores.   Eyes:  Positive for dryness.  Respiratory:  Negative for shortness of breath.   Cardiovascular:  Negative for chest pain and palpitations.  Gastrointestinal:  Negative for blood in stool, constipation and diarrhea.  Endocrine: Negative for increased urination.  Genitourinary:  Negative for involuntary urination.  Musculoskeletal:  Positive for joint pain, gait problem, joint pain, joint swelling, myalgias, muscle weakness, morning stiffness, muscle tenderness and myalgias.  Skin:  Negative for color change, rash, hair loss and sensitivity to sunlight.  Allergic/Immunologic: Negative for susceptible to infections.  Neurological:  Negative for dizziness and headaches.  Hematological:  Negative for swollen glands.  Psychiatric/Behavioral:  Negative for depressed mood and sleep disturbance. The patient is not nervous/anxious.     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   Other chest pain 03/10/2020  Shingles 11/19/2019   Pain in both knees 07/09/2019   Right arm pain 02/26/2019   Acute pain of left knee 08/27/2018   Hand arthritis 06/13/2018   Trigger middle finger of right hand 06/13/2018   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   Intermittent palpitations 10/11/2017   Migraine 12/30/2015   Upper airway cough syndrome 10/29/2015    Osteoporosis 06/06/2011   Vitamin D deficiency 03/22/2010   BREAST CANCER, HX OF 03/22/2010   HYPERLIPIDEMIA 09/28/2007   Fibromyalgia 05/24/2007    Past Medical History:  Diagnosis Date   Arthritis    Breast cancer (HCC)    R lumpectomy ; radiation; no chemotherapy   Bronchitis    recurrent   Bulging lumbar disc    Cataract    multifocal lens implant   Chronic low back pain    bulging disc & spinal stenosis   Coccygeal fracture (HCC) 05/15/2021   COVID-19    COVID-19 02/2023   Cystitis    Fibromyalgia    Hyperlipemia    Mitral valve prolapse    Osteoporosis    Personal history of radiation therapy    Pneumonia    as OP X 2; no Pneumovax   Ruptured disc, thoracic    Stage 3 chronic kidney disease (HCC)    Vitamin D deficiency    19 in  01/2009    Family History  Problem Relation Age of Onset   Atrial fibrillation Mother        S/P cardioversion   Ulcers Mother    Hypertension Mother    Dementia Mother    Heart attack Father 84       died @ 22   Hypertension Brother    Diabetes Brother    Atrial fibrillation Brother    Heart attack Paternal Aunt        < 65   Heart attack Paternal Uncle        > 55   Heart attack Paternal Grandmother        < 65   Heart attack Paternal Grandfather        > 55   Thyroid disease Daughter        on Synthroid since high school   Healthy Son    Stroke Neg Hx    Breast cancer Neg Hx    Past Surgical History:  Procedure Laterality Date   ABDOMINAL HYSTERECTOMY  1990   [facility]O for fibroid , Dr Laureen Ochs   BREAST LUMPECTOMY Right    BREAST SURGERY  2010   R side lumpectomy; Dr Jamey Ripa   CATARACT EXTRACTION, BILATERAL  2017   CESAREAN SECTION      G 2 P 2   CYSTECTOMY  2010   EYE SURGERY     eye lid surgery   FOOT SURGERY Right    no colonoscopy     "I just don't want to do it". SOC reviewed   Social History   Social History Narrative   Not on file   Immunization History  Administered Date(s) Administered   Fluad  Quad(high Dose 65+) 10/02/2019, 09/09/2022   Fluad Trivalent(High Dose 65+) 09/11/2023   PFIZER(Purple Top)SARS-COV-2 Vaccination 01/08/2020, 01/27/2020, 10/17/2020   Td 12/20/1999     Objective: Vital Signs: BP (!) 160/100 (BP Location: Left Arm, Patient Position: Sitting, Cuff Size: Normal)   Pulse 73   Resp 14   Ht 5' 4.5" (1.638 m)   Wt 137 lb (62.1 kg) Comment: Patient refused to weigh  BMI 23.15 kg/m  Physical Exam Vitals and nursing note reviewed.  Constitutional:      Appearance: She is well-developed.  HENT:     Head: Normocephalic and atraumatic.  Eyes:     Conjunctiva/sclera: Conjunctivae normal.  Cardiovascular:     Rate and Rhythm: Normal rate and regular rhythm.     Heart sounds: Normal heart sounds.  Pulmonary:     Effort: Pulmonary effort is normal.     Breath sounds: Normal breath sounds.  Abdominal:     General: Bowel sounds are normal.     Palpations: Abdomen is soft.  Musculoskeletal:     Cervical back: Normal range of motion.  Lymphadenopathy:     Cervical: No cervical adenopathy.  Skin:    General: Skin is warm and dry.     Capillary Refill: Capillary refill takes less than 2 seconds.  Neurological:     Mental Status: She is alert and oriented to person, place, and time.  Psychiatric:        Behavior: Behavior normal.      Musculoskeletal Exam: C-spine, thoracic spine, and lumbar spine good ROM.  Shoulder joints, elbow joints, and wrist joints have good ROM with no discomfort. Tenderness, synovitis, and thickening of the right 2nd MCP joint. Thickening of left 2nd MCP joint.  CMC, PIP, DIP thickening consistent with osteoarthritis of both hands.  Complete fist formation bilaterally.  Tenderness of the right CMC joint.  Right hip is slightly limited range of motion.  Left hip has full range of motion.  Effusion left knee.  No warmth or effusion of right knee.  Ankle joints have good ROM with no tenderness or joint swelling.  Mild pedal edema in  bilateral LE.   CDAI Exam: CDAI Score: -- Patient Global: --; Provider Global: -- Swollen: 2 ; Tender: 2  Joint Exam 11/22/2023      Right  Left  MCP 2  Swollen Tender     Knee     Swollen Tender     Investigation: No additional findings.  Imaging: No results found.  Recent Labs: Lab Results  Component Value Date   WBC 4.4 06/16/2023   HGB 11.9 06/16/2023   PLT 232 06/16/2023   NA 141 10/02/2023   K 4.5 10/02/2023   CL 109 (H) 10/02/2023   CO2 20 10/02/2023   GLUCOSE 113 (H) 10/02/2023   BUN 26 10/02/2023   CREATININE 1.34 (H) 10/02/2023   BILITOT 0.3 06/16/2023   ALKPHOS 65 03/10/2023   AST 16 06/16/2023   ALT 15 06/16/2023   PROT 6.6 06/16/2023   ALBUMIN 4.7 03/10/2023   CALCIUM 8.9 10/02/2023   GFRAA 58 (L) 05/24/2021   QFTBGOLDPLUS NEGATIVE 10/27/2021    Speciality Comments: PLQ Eye Exam: 6/5/2024WNL @ [facility] Follow up in 6 months.  Procedures:  No procedures performed Allergies: Morphine, Albuterol, Augmentin [amoxicillin-pot clavulanate], Ciprofloxacin, Fish allergy, Gabapentin, Risedronate sodium, Cyclobenzaprine, and Ibandronate sodium     Assessment / Plan:     Visit Diagnoses: Autoimmune disease (HCC) - Positive ANA, dry eyes, hair loss, Raynaud's, inflammatory arthritis: Patient presents today to discuss initiating Imuran.  She has been reviewing indications, contraindications, potential side effects of Imuran and is now open to proceeding with Imuran as combination therapy.  She has been taking Plaquenil 200 g 1 tablet by mouth twice daily Monday through Friday but has had ongoing pain involving multiple joints.  She has tenderness and synovitis of the right second MCP joint and a moderate effusion in the left  knee.  She continues to have chronic sicca symptoms, fatigue, and intermittent symptoms of Raynaud's phenomenon.  She is no longer taking amlodipine and has noticed her blood pressure has not been well-controlled for the past  week. She has also had more active symptoms of Raynaud's phenomenon since discontinuing amlodipine.  Discussed that she may benefit from resuming amlodipine 2.5 mg 1 tablet daily, so she will reach out to her cardiologist to further discuss restarting amlodipine.  Lab work from 06/16/23 was reviewed today in the office: ANA 1:1280 nuclear dense fine speckled, ESR WNL, protein creatinine ratio WNL, complements WNL, and dsDNA is negative. The following lab work will be updated today.   Patient plans on initiating Imuran 50 mg 1 tablet by mouth daily.  She will require updated CBC and CMP in 2 weeks.  Standing orders for CBC and CMP remain in place.  She will remain on Plaquenil as combination therapy.  She was advised to notify us if she cannot tolerate taking Imuran.  She will follow-up in the office in 2 to 3 months to assess her response. - Plan: Protein / creatinine ratio, urine, CBC with Differential/Platelet, COMPLETE METABOLIC PANEL WITH GFR, Anti-DNA antibody, double-stranded, C3 and C4, Sedimentation rate, ANA  High risk medication use - Imuran 50 mg 1 tablet by mouth daily and Plaquenil 200 mg 1 tablet by mouth twice daily Monday to Friday.  She has a prescription of Imuran at home which she plans on initiating.  A refill of plaquenil was sent to the pharmacy today.  PLQ Eye Exam: 05/24/2023 WNL @ [facility].  CBC and CMP updated on 06/16/23. Orders for CBC and CMP released today.  She will return for updated lab work in 2 weeks and then every 3 months.  Standing orders for CBC and CMP remain in place. - Plan: CBC with Differential/Platelet, COMPLETE METABOLIC PANEL WITH GFR  Chronic right shoulder pain: She has good ROM of the right shoulder with no discomfort currently.   Primary osteoarthritis of both hands: CMC, PIP, DIP thickening consistent with osteoarthritis of both hands.  Tenderness over the right CMC joint.  Discussed the importance of joint protection and muscle  strengthening.  Trigger middle finger of right hand: Not currently symptomatic.  Primary osteoarthritis of right hip: Limited range of motion.  Effusion, left knee: She has ongoing swelling in the left knee.  She has difficulty climbing steps due to instability and discomfort.  Small to moderate effusion noted in the left knee today.  Patient plans on initiating Imuran as discussed above.  She will remain on plaquenil as prescribed.   Primary osteoarthritis of both feet: She experiences intermittent discomfort in the right first MTP joint.  She has good range of motion of both ankle joints with no synovitis.  She is wearing proper fitting shoes.  DDD (degenerative disc disease), cervical - X-rays from March 29, 2023 showed degenerative changes and posterior osteophytes at C6- C7.  Patient has seen Dr. Yevette Edwards in the past.  Chronic pain.  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  She is taking tramadol 50 mg 1 tablet every 12 hours for pain relief.   Age-related osteoporosis without current pathological fracture -DEXA updated on 09/12/2023: LFN T-score -2.4, RFN T-score -2.3, lumbar spine T-score -1.6.   Previous DEXA 04/23/21: left femoral neck T score -2.7.  Intolerance to Fosamax and Boniva in the past. She is taking vitamin D 1000 units  daily and a calcium supplement daily.  Vitamin D deficiency: She is taking vitamin D 1000 units daily.    Closed fracture of coccyx with routine healing, subsequent encounter  Fibromyalgia - She continues to experience intermittent myalgias and muscle tenderness due to fibromyalgia.  She has been taking tramadol 50 mg 1 tablet by mouth twice daily as needed and trazodone 150 mg p.o. nightly as needed insomnia.    Other fatigue: Chronic, stable.   Other medical conditions are listed as follows:   Primary hypertension: BP was elevated today in the office--160/100 today in the  office.  She has been taking losartan as prescribed.  Under the care of cardiology-advanced hypertension clinic.  She was previously taken off amlodipine.   Discussed that she may benefit from restarting a low-dose of amlodipine since her blood pressure has not been well-controlled and she has had a recurrence of Raynaud's phenomenon.  Patient plans on reaching out to her cardiologist for further recommendations.  History of breast cancer  Vitamin B12 deficiency  Hx of migraines  History of shingles  Orders: Orders Placed This Encounter  Procedures   Protein / creatinine ratio, urine   CBC with Differential/Platelet   COMPLETE METABOLIC PANEL WITH GFR   Anti-DNA antibody, double-stranded   C3 and C4   Sedimentation rate   ANA   Meds ordered this encounter  Medications   hydroxychloroquine (PLAQUENIL) 200 MG tablet    Sig: TAKE 1 TABLET BY MOUTH TWICE DAILY, MONDAY THROUGH FRIDAY    Dispense:  120 tablet    Refill:  0     Follow-Up Instructions: Return in about 2 months (around 01/23/2024) for Autoimmune Disease.   Gearldine Bienenstock, PA-C  Note - This record has been created using Dragon software.  Chart creation errors have been sought, but may not always  have been located. Such creation errors do not reflect on  the standard of medical care.

## 2023-11-22 ENCOUNTER — Telehealth: Payer: Self-pay | Admitting: Internal Medicine

## 2023-11-22 ENCOUNTER — Other Ambulatory Visit: Payer: Self-pay

## 2023-11-22 ENCOUNTER — Emergency Department (HOSPITAL_BASED_OUTPATIENT_CLINIC_OR_DEPARTMENT_OTHER): Payer: Medicare Other

## 2023-11-22 ENCOUNTER — Encounter: Payer: Self-pay | Admitting: Internal Medicine

## 2023-11-22 ENCOUNTER — Encounter: Payer: Self-pay | Admitting: Pharmacist Clinician (PhC)/ Clinical Pharmacy Specialist

## 2023-11-22 ENCOUNTER — Emergency Department (HOSPITAL_BASED_OUTPATIENT_CLINIC_OR_DEPARTMENT_OTHER)
Admission: EM | Admit: 2023-11-22 | Discharge: 2023-11-22 | Disposition: A | Discharge status: Home / Self Care | Payer: Medicare Other | Attending: Emergency Medicine | Admitting: Emergency Medicine

## 2023-11-22 ENCOUNTER — Encounter (HOSPITAL_BASED_OUTPATIENT_CLINIC_OR_DEPARTMENT_OTHER): Payer: Self-pay | Admitting: Emergency Medicine

## 2023-11-22 ENCOUNTER — Encounter: Payer: Self-pay | Admitting: Physician Assistant

## 2023-11-22 ENCOUNTER — Ambulatory Visit (INDEPENDENT_AMBULATORY_CARE_PROVIDER_SITE_OTHER): Payer: Medicare Other | Admitting: Physician Assistant

## 2023-11-22 ENCOUNTER — Encounter (HOSPITAL_BASED_OUTPATIENT_CLINIC_OR_DEPARTMENT_OTHER): Payer: Self-pay | Admitting: Cardiovascular Disease

## 2023-11-22 VITALS — BP 160/100 | HR 73 | Resp 14 | Ht 64.5 in | Wt 137.0 lb

## 2023-11-22 DIAGNOSIS — Z8619 Personal history of other infectious and parasitic diseases: Secondary | ICD-10-CM

## 2023-11-22 DIAGNOSIS — G8929 Other chronic pain: Secondary | ICD-10-CM

## 2023-11-22 DIAGNOSIS — M5030 Other cervical disc degeneration, unspecified cervical region: Secondary | ICD-10-CM | POA: Insufficient documentation

## 2023-11-22 DIAGNOSIS — I7300 Raynaud's syndrome without gangrene: Secondary | ICD-10-CM | POA: Diagnosis not present

## 2023-11-22 DIAGNOSIS — I10 Essential (primary) hypertension: Secondary | ICD-10-CM | POA: Diagnosis present

## 2023-11-22 DIAGNOSIS — S322XXD Fracture of coccyx, subsequent encounter for fracture with routine healing: Secondary | ICD-10-CM | POA: Insufficient documentation

## 2023-11-22 DIAGNOSIS — M51369 Other intervertebral disc degeneration, lumbar region without mention of lumbar back pain or lower extremity pain: Secondary | ICD-10-CM

## 2023-11-22 DIAGNOSIS — R5383 Other fatigue: Secondary | ICD-10-CM | POA: Insufficient documentation

## 2023-11-22 DIAGNOSIS — X58XXXD Exposure to other specified factors, subsequent encounter: Secondary | ICD-10-CM | POA: Diagnosis not present

## 2023-11-22 DIAGNOSIS — Z853 Personal history of malignant neoplasm of breast: Secondary | ICD-10-CM

## 2023-11-22 DIAGNOSIS — M810 Age-related osteoporosis without current pathological fracture: Secondary | ICD-10-CM

## 2023-11-22 DIAGNOSIS — Z8669 Personal history of other diseases of the nervous system and sense organs: Secondary | ICD-10-CM | POA: Diagnosis not present

## 2023-11-22 DIAGNOSIS — M65331 Trigger finger, right middle finger: Secondary | ICD-10-CM | POA: Insufficient documentation

## 2023-11-22 DIAGNOSIS — M797 Fibromyalgia: Secondary | ICD-10-CM | POA: Diagnosis not present

## 2023-11-22 DIAGNOSIS — M19042 Primary osteoarthritis, left hand: Secondary | ICD-10-CM

## 2023-11-22 DIAGNOSIS — E538 Deficiency of other specified B group vitamins: Secondary | ICD-10-CM | POA: Diagnosis not present

## 2023-11-22 DIAGNOSIS — M19071 Primary osteoarthritis, right ankle and foot: Secondary | ICD-10-CM | POA: Insufficient documentation

## 2023-11-22 DIAGNOSIS — M1611 Unilateral primary osteoarthritis, right hip: Secondary | ICD-10-CM | POA: Diagnosis not present

## 2023-11-22 DIAGNOSIS — E559 Vitamin D deficiency, unspecified: Secondary | ICD-10-CM

## 2023-11-22 DIAGNOSIS — Z79899 Other long term (current) drug therapy: Secondary | ICD-10-CM

## 2023-11-22 DIAGNOSIS — M25462 Effusion, left knee: Secondary | ICD-10-CM

## 2023-11-22 DIAGNOSIS — M25511 Pain in right shoulder: Secondary | ICD-10-CM

## 2023-11-22 DIAGNOSIS — M359 Systemic involvement of connective tissue, unspecified: Secondary | ICD-10-CM

## 2023-11-22 DIAGNOSIS — M19041 Primary osteoarthritis, right hand: Secondary | ICD-10-CM | POA: Insufficient documentation

## 2023-11-22 DIAGNOSIS — M19072 Primary osteoarthritis, left ankle and foot: Secondary | ICD-10-CM

## 2023-11-22 DIAGNOSIS — I159 Secondary hypertension, unspecified: Secondary | ICD-10-CM | POA: Insufficient documentation

## 2023-11-22 LAB — BASIC METABOLIC PANEL
Anion gap: 8 (ref 5–15)
BUN: 24 mg/dL — ABNORMAL HIGH (ref 8–23)
CO2: 26 mmol/L (ref 22–32)
Calcium: 9.4 mg/dL (ref 8.9–10.3)
Chloride: 106 mmol/L (ref 98–111)
Creatinine, Ser: 1.14 mg/dL — ABNORMAL HIGH (ref 0.44–1.00)
GFR, Estimated: 50 mL/min — ABNORMAL LOW (ref >60)
Glucose, Bld: 103 mg/dL — ABNORMAL HIGH (ref 70–99)
Potassium: 3.8 mmol/L (ref 3.5–5.1)
Sodium: 140 mmol/L (ref 135–145)

## 2023-11-22 LAB — CBC WITH DIFFERENTIAL/PLATELET
Abs Immature Granulocytes: 0.02 10*3/uL (ref 0.00–0.07)
Basophils Absolute: 0 10*3/uL (ref 0.0–0.1)
Basophils Relative: 1 %
Eosinophils Absolute: 0.1 10*3/uL (ref 0.0–0.5)
Eosinophils Relative: 1 %
HCT: 35.6 % — ABNORMAL LOW (ref 36.0–46.0)
Hemoglobin: 11.7 g/dL — ABNORMAL LOW (ref 12.0–15.0)
Immature Granulocytes: 0 %
Lymphocytes Relative: 16 %
Lymphs Abs: 1.3 10*3/uL (ref 0.7–4.0)
MCH: 30.7 pg (ref 26.0–34.0)
MCHC: 32.9 g/dL (ref 30.0–36.0)
MCV: 93.4 fL (ref 80.0–100.0)
Monocytes Absolute: 0.7 10*3/uL (ref 0.1–1.0)
Monocytes Relative: 8 %
Neutro Abs: 6.1 10*3/uL (ref 1.7–7.7)
Neutrophils Relative %: 74 %
Platelets: 277 10*3/uL (ref 150–400)
RBC: 3.81 MIL/uL — ABNORMAL LOW (ref 3.87–5.11)
RDW: 13.1 % (ref 11.5–15.5)
WBC: 8.2 10*3/uL (ref 4.0–10.5)
nRBC: 0 % (ref 0.0–0.2)

## 2023-11-22 LAB — TROPONIN I (HIGH SENSITIVITY): Troponin I (High Sensitivity): 8 ng/L (ref <18)

## 2023-11-22 MED ORDER — AMLODIPINE BESYLATE 5 MG PO TABS
5.0000 mg | ORAL_TABLET | Freq: Once | ORAL | Status: AC
  Administered 2023-11-22: 5 mg via ORAL
  Filled 2023-11-22: qty 1

## 2023-11-22 MED ORDER — AMLODIPINE BESYLATE 5 MG PO TABS: 5.0000 mg | ORAL_TABLET | Freq: Every day | ORAL | 0 refills | Status: DC

## 2023-11-22 MED ORDER — AZATHIOPRINE 50 MG PO TABS: 50.0000 mg | ORAL_TABLET | Freq: Every day | ORAL | 2 refills | Status: DC

## 2023-11-22 MED ORDER — HYDROXYCHLOROQUINE SULFATE 200 MG PO TABS: ORAL_TABLET | ORAL | 0 refills | Status: DC

## 2023-11-22 NOTE — Telephone Encounter (Signed)
Please review and sign "no print" Imuran rx and standing lab orders. Thanks!

## 2023-11-22 NOTE — Patient Instructions (Addendum)
Standing Labs We placed an order today for your standing lab work.   Please have your standing labs drawn in 2 weeks then every 3 months  Please have your labs drawn 2 weeks prior to your appointment so that the provider can discuss your lab results at your appointment, if possible.  Please note that you may see your imaging and lab results in MyChart before we have reviewed them. We will contact you once all results are reviewed. Please allow our office up to 72 hours to thoroughly review all of the results before contacting the office for clarification of your results.  WALK-IN LAB HOURS  Monday through Thursday from 8:00 am -12:30 pm and 1:00 pm-5:00 pm and Friday from 8:00 am-12:00 pm.  Patients with office visits requiring labs will be seen before walk-in labs.  You may encounter longer than normal wait times. Please allow additional time. Wait times may be shorter on  Monday and Thursday afternoons.  We do not book appointments for walk-in labs. We appreciate your patience and understanding with our staff.   Labs are drawn by [facility]. Please bring your co-pay at the time of your lab draw.  You may receive a bill from [facility] for your lab work.  Please note if you are on Hydroxychloroquine and and an order has been placed for a Hydroxychloroquine level,  you will need to have it drawn 4 hours or more after your last dose.  If you wish to have your labs drawn at another location, please call the office 24 hours in advance so we can fax the orders.  The office is located at [address], [address], [place], [place] [address]   If you have any questions regarding directions or hours of operation,  please call [phone].   As a reminder, please drink plenty of water prior to coming for your lab work. Thanks!

## 2023-11-22 NOTE — Telephone Encounter (Signed)
Triage called recommended patient see a doctor within 3-4 hours, states going to recommend urgent care. Patient has been traveling and not checking BP, patient is on BP medication, today BP was 200/118 not showing any symptoms. Patient saw rheumatologist earlier today and BP was 160-170/100's.

## 2023-11-22 NOTE — ED Triage Notes (Signed)
Pt POV reporting BP 200 sys at home. Pt endorses "not feeling right" past week, began logging BP and it has been consistently high. Has been taking losartan as prescribed. No dizziness, blurred vision, syncope. Called PCP, advised to come to ED.

## 2023-11-22 NOTE — ED Provider Notes (Signed)
[facility] AT [facility] Provider Note   CSN: [id number] Arrival date & time: 11/22/23  1806     History No chief complaint on file.   HPI [name] is a 77 y.o. female presenting for asymptomatic hypertension.  Patient's recorded medical, surgical, social, medication list and allergies were reviewed in the Snapshot window as part of the initial history.   Review of Systems   Review of Systems  Constitutional:  Negative for chills and fever.  HENT:  Negative for ear pain and sore throat.   Eyes:  Negative for pain and visual disturbance.  Respiratory:  Negative for cough and shortness of breath.   Cardiovascular:  Negative for chest pain and palpitations.  Gastrointestinal:  Negative for abdominal pain and vomiting.  Genitourinary:  Negative for dysuria and hematuria.  Musculoskeletal:  Negative for arthralgias and back pain.  Skin:  Negative for color change and rash.  Neurological:  Negative for seizures and syncope.  All other systems reviewed and are negative.   Physical Exam Updated Vital Signs BP (!) 188/92   Pulse 78   Temp 98.2 F (36.8 C)   Resp 18   Ht 5' 4.5" (1.638 m)   Wt 62.1 kg   SpO2 100%   BMI 23.15 kg/m  Physical Exam Vitals and nursing note reviewed.  Constitutional:      General: She is not in acute distress.    Appearance: She is well-developed.  HENT:     Head: Normocephalic and atraumatic.  Eyes:     Conjunctiva/sclera: Conjunctivae normal.  Cardiovascular:     Rate and Rhythm: Normal rate and regular rhythm.     Heart sounds: No murmur heard. Pulmonary:     Effort: Pulmonary effort is normal. No respiratory distress.     Breath sounds: Normal breath sounds.  Abdominal:     Palpations: Abdomen is soft.     Tenderness: There is no abdominal tenderness.  Musculoskeletal:        General: No swelling.     Cervical back: Neck supple.  Skin:    General: Skin is warm and dry.     Capillary  Refill: Capillary refill takes less than 2 seconds.  Neurological:     Mental Status: She is alert.  Psychiatric:        Mood and Affect: Mood normal.      ED Course/ Medical Decision Making/ A&P    Procedures Procedures   Medications Ordered in ED Medications  amLODipine (NORVASC) tablet 5 mg (has no administration in time range)    Medical Decision Making:   [name] is a 77 y.o. female who presented to the ED today with elevated high blood pressure detailed above.    Additional history discussed with patient's family/caregivers.  Patient placed on continuous vitals and telemetry monitoring while in ED which was reviewed periodically.  Complete initial physical exam performed, notably the patient  was HDS in NAD.    Reviewed and confirmed nursing documentation for past medical history, family history, social history.    Initial Assessment:   With the patient's presentation of elevated blood pressure readings, most likely diagnosis is hypertensive urgency. Other diagnoses associated with hypertensive emergency were considered including (but not limited to) intracranial hemorrhage, acute renal artery stenosis, acute kidney injury, myocardial stress, ophthalmologic emergencies. These are considered less likely due to history of present illness and physical exam findings.   This is most consistent with an acute life/limb threatening illness complicated  by underlying chronic conditions. Will evaluate for hypertensive emergency as below.  Initial Plan:  Screening labs including CBC and Metabolic panel to evaluate for infectious or metabolic etiology of disease.  Urinalysis with reflex culture ordered to evaluate for UTI or relevant urologic/nephrologic pathology.  CXR to evaluate for structural/infectious intrathoracic pathology.  Troponin/EKG to evaluate for cardiac pathology. Objective evaluation as below reviewed. Considered further administration of antihypertensives in  ED, per consensus guidelines for [facility] of emergency physicians, acute treatment of hypertensive urgency alone in the emergency department is not recommended.  If patient has evidence of hypertensive emergency on objective laboratory evaluation, will reevaluate.  Will monitor blood pressure while patient awaiting above laboratory studies.  Initial Study Results:   Laboratory  All laboratory results reviewed without evidence of clinically relevant pathology.     EKG EKG was reviewed independently. Rate, rhythm, axis, intervals all examined and without medically relevant abnormality. ST segments without concerns for elevations.    Radiology:  All images reviewed independently. Agree with radiology report at this time.   DG Chest Portable 1 View  Result Date: 11/22/2023 CLINICAL DATA:  Hypertensive crisis. EXAM: PORTABLE CHEST 1 VIEW COMPARISON:  03/04/2020 FINDINGS: Mild stable hyperinflation. No acute pulmonary process. No pulmonary lesions, pleural effusion or pneumothorax. The cardiac silhouette, mediastinal and hilar contours are within normal limits. No significant bony findings. IMPRESSION: Mild hyperinflation but no acute pulmonary findings. Electronically Signed   By: Rudie Meyer M.D.   On: 11/22/2023 22:22     Reassessment and Plan:   On reassessment she is in no acute distress.  Blood pressure has ranged from 130 systolic to 190 systolic during her evaluation today.  I feel the patient likely needs to go back on her Norvasc (rheumatology also agreed when they saw her in clinic today) and likely will need to be titrated up to 10 mg Norvasc depending on response.  Otherwise patient is stable to follow-up with PCP in outpatient setting without a acute indication for further intervention.  Disposition:  I have considered need for hospitalization, however, considering all of the above, I believe this patient is stable for discharge at this time.  Patient/family educated about  specific return precautions for given chief complaint and symptoms.  Patient/family educated about follow-up with PCP.     Patient/family expressed understanding of return precautions and need for follow-up. Patient spoken to regarding all imaging and laboratory results and appropriate follow up for these results. All education provided in verbal form with additional information in written form. Time was allowed for answering of patient questions. Patient discharged.    Emergency Department Medication Summary:   Medications  amLODipine (NORVASC) tablet 5 mg (has no administration in time range)         Clinical Impression:  1. Secondary hypertension      Discharge   Final Clinical Impression(s) / ED Diagnoses Final diagnoses:  Secondary hypertension    Rx / DC Orders ED Discharge Orders          Ordered    amLODipine (NORVASC) 5 MG tablet  Daily        11/22/23 2236              Glyn Ade, MD 11/22/23 2242

## 2023-11-23 ENCOUNTER — Telehealth: Payer: Self-pay

## 2023-11-23 ENCOUNTER — Telehealth: Payer: Self-pay | Admitting: Internal Medicine

## 2023-11-23 NOTE — Telephone Encounter (Signed)
Pt called was seen at the Hospital last night for high blood pressure stated the Hospital told her to call her her Doctor and let her know what's going on advise pt she can see it in her chart and they didn't want her to call her Doctor they wanted her to scheduled an Hospital follow up pt stated no they wanted to call her doctor.

## 2023-11-23 NOTE — Telephone Encounter (Signed)
Responded back to patient via [employer].

## 2023-11-23 NOTE — Transitions of Care (Post Inpatient/ED Visit) (Signed)
11/23/2023  Name: [name] MRN: [id number] DOB: [birth year]  Today's TOC FU Call Status: Today's TOC FU Call Status:: Successful TOC FU Call Completed TOC FU Call Complete Date: 11/23/23 Patient's Name and Date of Birth confirmed.  Transition Care Management Follow-up Telephone Call Date of Discharge: 11/22/23 Discharge Facility: [facility] ([facility]) Type of Discharge: Emergency Department Reason for ED Visit: Other: (hypertension) How have you been since you were released from the hospital?: Better Any questions or concerns?: No  Items Reviewed: Did you receive and understand the discharge instructions provided?: Yes Medications obtained,verified, and reconciled?: Yes (Medications Reviewed) Any new allergies since your discharge?: No Dietary orders reviewed?: Yes Do you have support at home?: Yes People in Home: spouse  Medications Reviewed Today: Medications Reviewed Today     Reviewed by Karena Addison, LPN (Licensed Practical Nurse) on 11/23/23 at 1451  Med List Status: <None>   Medication Order Taking? Sig Documenting Provider Last Dose Status Informant  acetaminophen (TYLENOL) 500 MG tablet [id number] No Take 1,000 mg by mouth daily as needed for mild pain. [provider] Taking Active Self  amLODipine (NORVASC) 5 MG tablet [id number]  Take 1 tablet (5 mg total) by mouth daily. Glyn Ade, MD  Active   azaTHIOprine (IMURAN) 50 MG tablet [id number]  Take 1 tablet (50 mg total) by mouth daily. Gearldine Bienenstock, PA-C  Active   CALCIUM PO [id number] No Take by mouth daily. [provider] Taking Active   cephALEXin (KEFLEX) 500 MG capsule [id number] No Take 1 capsule (500 mg total) by mouth 2 (two) times daily.  Patient not taking: Reported on 11/22/2023   Pincus Sanes, MD Not Taking Active   cholecalciferol (VITAMIN D3) 25 MCG (1000 UNIT) tablet [id number] No Take 1,000 Units by mouth daily. [provider] Taking Active Self   CRANBERRY PO [id number] No Take by mouth. [provider] Taking Active   Cyanocobalamin (B-12 PO) [id number] No Take by mouth. [provider] Taking Active   eletriptan (RELPAX) 20 MG tablet [id number] No TAKE 1 TABLET BY MOUTH EVERY 2 HOURS AS NEEDED FOR HEADACHE OR MIGRAINE. MAY REPEAT IN 2 HOURS IF HEADACHE PERSISTS OR RECURS. Pincus Sanes, MD Taking Active   hydroxychloroquine (PLAQUENIL) 200 MG tablet [id number]  TAKE 1 TABLET BY MOUTH TWICE DAILY, MONDAY THROUGH FRIDAY Gearldine Bienenstock, PA-C  Active   losartan (COZAAR) 50 MG tablet [id number] No Take 1 tablet (50 mg total) by mouth 2 (two) times daily. Chilton Si, MD Taking Active   ondansetron (ZOFRAN-ODT) 4 MG disintegrating tablet [id number] No Take 1 tablet (4 mg total) by mouth every 8 (eight) hours as needed for nausea or vomiting. Pincus Sanes, MD Taking Active   Probiotic Product (PROBIOTIC DAILY PO) [id number] No Take by mouth. [provider] Taking Active   traMADol (ULTRAM) 50 MG tablet [id number] No Take 1 tablet (50 mg total) by mouth every 12 (twelve) hours as needed. Pincus Sanes, MD Taking Active   traZODone (DESYREL) 50 MG tablet [id number] No TAKE 3 TABLETS(150 MG) BY MOUTH AT BEDTIME Pincus Sanes, MD Taking Active             Home Care and Equipment/Supplies: Were Home Health Services Ordered?: NA Any new equipment or medical supplies ordered?: NA  Functional Questionnaire: Do you need assistance with bathing/showering or dressing?: No Do you need assistance with meal preparation?: No Do you need assistance with eating?: No Do you have difficulty maintaining continence: No Do you  need assistance with getting out of bed/getting out of a chair/moving?: No Do you have difficulty managing or taking your medications?: No  Follow up appointments reviewed: PCP Follow-up appointment confirmed?: No (Dr. Lawerance Bach left message for patient to keep record of BP and let her know what it was  next week) MD Provider Line Number:[phone] Given: No Specialist Hospital Follow-up appointment confirmed?: NA Do you need transportation to your follow-up appointment?: No Do you understand care options if your condition(s) worsen?: Yes-patient verbalized understanding    SIGNATURE Karena Addison, LPN [facility] Nurse Health Advisor Direct Dial [phone]

## 2023-11-23 NOTE — Telephone Encounter (Signed)
Seen in ED yesterday with BP 130-190s. Labs, EKG unremarkable. Amlodipine 5mg  daily was resumed. Agree with this addition.  Recommend she check BP once per day and send [place] a log in a week.  Recommend follow-up with [facility] APP/PharmD/MD in 2-4 weeks to reassess BP control.   Alver Sorrow, NP

## 2023-11-23 NOTE — Progress Notes (Signed)
CBC WNL  ESR WNL  Complements WNL  Creatinine remains slightly elevated-1.14 and GFR is low at 50-improving. Avoid the use of NSAIDs.  Return in 2 weeks to update CBC and CMP after initiating imuran.

## 2023-11-23 NOTE — Telephone Encounter (Signed)
Noted.  Patient seen at [facility] on yesterday.

## 2023-11-24 LAB — CBC WITH DIFFERENTIAL/PLATELET
Absolute Lymphocytes: 1426 {cells}/uL (ref 850–3900)
Absolute Monocytes: 490 {cells}/uL (ref 200–950)
Basophils Absolute: 58 {cells}/uL (ref 0–200)
Basophils Relative: 0.8 %
Eosinophils Absolute: 58 {cells}/uL (ref 15–500)
Eosinophils Relative: 0.8 %
HCT: 35.5 % (ref 35.0–45.0)
Hemoglobin: 11.9 g/dL (ref 11.7–15.5)
MCH: 31.3 pg (ref 27.0–33.0)
MCHC: 33.5 g/dL (ref 32.0–36.0)
MCV: 93.4 fL (ref 80.0–100.0)
MPV: 10 fL (ref 7.5–12.5)
Monocytes Relative: 6.8 %
Neutro Abs: 5170 {cells}/uL (ref 1500–7800)
Neutrophils Relative %: 71.8 %
Platelets: 300 10*3/uL (ref 140–400)
RBC: 3.8 10*6/uL (ref 3.80–5.10)
RDW: 12 % (ref 11.0–15.0)
Total Lymphocyte: 19.8 %
WBC: 7.2 10*3/uL (ref 3.8–10.8)

## 2023-11-24 LAB — COMPLETE METABOLIC PANEL WITH GFR
AG Ratio: 1.8 (calc) (ref 1.0–2.5)
ALT: 24 U/L (ref 6–29)
AST: 24 U/L (ref 10–35)
Albumin: 4.5 g/dL (ref 3.6–5.1)
Alkaline phosphatase (APISO): 74 U/L (ref 37–153)
BUN/Creatinine Ratio: 17 (calc) (ref 6–22)
BUN: 19 mg/dL (ref 7–25)
CO2: 26 mmol/L (ref 20–32)
Calcium: 9.8 mg/dL (ref 8.6–10.4)
Chloride: 104 mmol/L (ref 98–110)
Creat: 1.14 mg/dL — ABNORMAL HIGH (ref 0.60–1.00)
Globulin: 2.5 g/dL (ref 1.9–3.7)
Glucose, Bld: 96 mg/dL (ref 65–99)
Potassium: 4.5 mmol/L (ref 3.5–5.3)
Sodium: 139 mmol/L (ref 135–146)
Total Bilirubin: 0.4 mg/dL (ref 0.2–1.2)
Total Protein: 7 g/dL (ref 6.1–8.1)
eGFR: 50 mL/min/{1.73_m2} — ABNORMAL LOW (ref >=60)

## 2023-11-24 LAB — PROTEIN / CREATININE RATIO, URINE
Creatinine, Urine: 56 mg/dL (ref 20–275)
Total Protein, Urine: <4 mg/dL — ABNORMAL LOW (ref 5–24)

## 2023-11-24 LAB — C3 AND C4
C3 Complement: 145 mg/dL (ref 83–193)
C4 Complement: 30 mg/dL (ref 15–57)

## 2023-11-24 LAB — ANTI-NUCLEAR AB-TITER (ANA TITER): ANA Titer 1: 1:80 {titer} — ABNORMAL HIGH

## 2023-11-24 LAB — SEDIMENTATION RATE: Sed Rate: 11 mm/h (ref 0–30)

## 2023-11-24 LAB — ANA: Anti Nuclear Antibody (ANA): POSITIVE — AB

## 2023-11-24 LAB — ANTI-DNA ANTIBODY, DOUBLE-STRANDED: ds DNA Ab: <1 [IU]/mL

## 2023-11-24 NOTE — Progress Notes (Signed)
dsDNA is negative

## 2023-11-24 NOTE — Progress Notes (Signed)
No proteinuria

## 2023-11-26 NOTE — Progress Notes (Signed)
ANA remains positive-lower titter.

## 2023-11-29 ENCOUNTER — Telehealth: Payer: Self-pay | Admitting: *Deleted

## 2023-11-29 NOTE — Telephone Encounter (Signed)
I returned patient's call and discussed that fatigue can come from Imuran.  She may wish to discontinue Imuran or she may continue it.  She prefers to continue Imuran for now.  She will contact us if her symptoms get worse.

## 2023-11-29 NOTE — Telephone Encounter (Signed)
Patient contacted the office stating she is having trouble with her left ring  finger being swollen and turning purple. Patient states it is the whole finger except for the tip. Patient states it is moving to her middle finger. Patient states she does not recall any injury. Patient states about 1.5 weeks ago she start having episodes where her hands will turn really white and then get better and then they would turn purple and then get better. This is worse then they have been. Please advise.

## 2023-11-29 NOTE — Telephone Encounter (Signed)
Her symptoms are consistent with Raynaud's phenomenon.  She should wear warm clothing and keep core temperature warm.  Please send her a handout on Raynaud's phenomenon.  Patient may call us back if her symptoms persist.

## 2023-11-29 NOTE — Telephone Encounter (Signed)
Attempted to contact the patient and left message for patient to call the office.  

## 2023-11-29 NOTE — Telephone Encounter (Signed)
I called patient, patient verbalized understanding, handout for Raynaud's mailed to patient.

## 2023-11-30 ENCOUNTER — Telehealth (HOSPITAL_BASED_OUTPATIENT_CLINIC_OR_DEPARTMENT_OTHER): Payer: Self-pay

## 2023-11-30 NOTE — Telephone Encounter (Signed)
MyChart message sent to f/u on blood pressures.

## 2023-12-05 MED ORDER — AMLODIPINE BESYLATE 5 MG PO TABS: 7.5000 mg | ORAL_TABLET | Freq: Every day | ORAL | Status: DC

## 2023-12-05 NOTE — Addendum Note (Signed)
Addended by: Pincus Sanes on: 12/05/2023 07:44 AM   Modules accepted: Orders

## 2023-12-06 ENCOUNTER — Other Ambulatory Visit: Payer: Self-pay | Admitting: *Deleted

## 2023-12-06 DIAGNOSIS — Z79899 Other long term (current) drug therapy: Secondary | ICD-10-CM

## 2023-12-07 LAB — COMPLETE METABOLIC PANEL WITH GFR
AG Ratio: 1.8 (calc) (ref 1.0–2.5)
ALT: 12 U/L (ref 6–29)
AST: 15 U/L (ref 10–35)
Albumin: 4.2 g/dL (ref 3.6–5.1)
Alkaline phosphatase (APISO): 62 U/L (ref 37–153)
BUN/Creatinine Ratio: 21 (calc) (ref 6–22)
BUN: 23 mg/dL (ref 7–25)
CO2: 24 mmol/L (ref 20–32)
Calcium: 9.6 mg/dL (ref 8.6–10.4)
Chloride: 107 mmol/L (ref 98–110)
Creat: 1.08 mg/dL — ABNORMAL HIGH (ref 0.60–1.00)
Globulin: 2.3 g/dL (ref 1.9–3.7)
Glucose, Bld: 98 mg/dL (ref 65–99)
Potassium: 4.6 mmol/L (ref 3.5–5.3)
Sodium: 140 mmol/L (ref 135–146)
Total Bilirubin: 0.4 mg/dL (ref 0.2–1.2)
Total Protein: 6.5 g/dL (ref 6.1–8.1)
eGFR: 53 mL/min/{1.73_m2} — ABNORMAL LOW (ref >=60)

## 2023-12-07 LAB — CBC WITH DIFFERENTIAL/PLATELET
Absolute Lymphocytes: 933 {cells}/uL (ref 850–3900)
Absolute Monocytes: 370 {cells}/uL (ref 200–950)
Basophils Absolute: 39 {cells}/uL (ref 0–200)
Basophils Relative: 0.9 %
Eosinophils Absolute: 69 {cells}/uL (ref 15–500)
Eosinophils Relative: 1.6 %
HCT: 34.8 % — ABNORMAL LOW (ref 35.0–45.0)
Hemoglobin: 11.2 g/dL — ABNORMAL LOW (ref 11.7–15.5)
MCH: 30.3 pg (ref 27.0–33.0)
MCHC: 32.2 g/dL (ref 32.0–36.0)
MCV: 94.1 fL (ref 80.0–100.0)
MPV: 10 fL (ref 7.5–12.5)
Monocytes Relative: 8.6 %
Neutro Abs: 2890 {cells}/uL (ref 1500–7800)
Neutrophils Relative %: 67.2 %
Platelets: 284 10*3/uL (ref 140–400)
RBC: 3.7 10*6/uL — ABNORMAL LOW (ref 3.80–5.10)
RDW: 12 % (ref 11.0–15.0)
Total Lymphocyte: 21.7 %
WBC: 4.3 10*3/uL (ref 3.8–10.8)

## 2023-12-07 NOTE — Progress Notes (Signed)
Creatinine is borderline but has improved-1.08 and GFR is low-53.  Rest of CMP WNL.  RBC count, hgb, and hct remain low and continue to trend down slightly.  Could be related to CKD.   Any source of bleeding? Is she taking a multivitamin with iron?

## 2023-12-07 NOTE — Progress Notes (Signed)
Please forward results to PCP

## 2023-12-20 NOTE — Progress Notes (Signed)
 [facility] Assessment:    Date:  12/21/2023   ID:  [name], DOB 31-Aug-1946, MRN [id number]  PCP:  Geofm Glade PARAS, MD  Cardiologist:  None  Nephrologist:  Referring MD: Geofm Glade PARAS, MD   CC: Hypertension  History of Present Illness:    [name] is a 78 y.o. female with a hx of HTN, HLD, CKDIIIa, prior breast cancer here to follow up in the [facility].   Established with [facility] 08/18/23 as BP remained elevated and labile despite Bystolic , Amlodipine , Losartan . Cortisol and plasma metanephrines/catecholamines were normal. At PharmD visit 09/18/23 Amlodipine  was stopped and Losartan  increased to 50mg  BID. At visit 10/18/23 her BP was controlled on Losartan  50mg  BID, resumption of Bystolic  was deferred. ED visit 11/22/23 with elevated BP, Amlodipine  5mg  was resumed.   Presents today for follow up. Stopped taking Imuran  a few days ago due to nausea, fatigue. Plans to reach out to rheumatology this afternoon. Has concerns about anemia with 11/22/23 Hb 11.7 and 12/06/23 Hb 11.2. Discussed low suspicion this is significantly contributing to fatigue. Encouraged oral sources of iron, handouts provided. SBP at home 109-140s with overall readings <130/80. No chest pain, exertional dyspnea. Mild lightheadedness on occasion with low BP but no near syncope, syncope.   Previous antihypertensives:   Past Medical History:  Diagnosis Date   Arthritis    Breast cancer (HCC)    R lumpectomy ; radiation; no chemotherapy   Bronchitis    recurrent   Bulging lumbar disc    Cataract    multifocal lens implant   Chronic low back pain    bulging disc & spinal stenosis   Coccygeal fracture (HCC) 05/15/2021   COVID-19    COVID-19 02/2023   Cystitis    Fibromyalgia    Hyperlipemia    Mitral valve prolapse    Osteoporosis    Personal history of radiation therapy    Pneumonia    as OP X 2; no Pneumovax   Ruptured disc,  thoracic    Stage 3 chronic kidney disease (HCC)    Vitamin D  deficiency    19 in  01/2009    Past Surgical History:  Procedure Laterality Date   ABDOMINAL HYSTERECTOMY  1990   [facility]O for fibroid , Dr Freya   BREAST LUMPECTOMY Right    BREAST SURGERY  2010   R side lumpectomy; Dr Merrilyn   CATARACT EXTRACTION, BILATERAL  2017   CESAREAN SECTION      G 2 P 2   CYSTECTOMY  2010   EYE SURGERY     eye lid surgery   FOOT SURGERY Right    no colonoscopy     I just don't want to do it. SOC reviewed    Current Medications: Current Meds  Medication Sig   acetaminophen (TYLENOL) 500 MG tablet Take 1,000 mg by mouth daily as needed for mild pain.   CALCIUM  PO Take by mouth daily.   cholecalciferol (VITAMIN D3) 25 MCG (1000 UNIT) tablet Take 1,000 Units by mouth daily.   CRANBERRY PO Take by mouth.   Cyanocobalamin  (B-12 PO) Take by mouth.   eletriptan  (RELPAX ) 20 MG tablet TAKE 1 TABLET BY MOUTH EVERY 2 HOURS AS NEEDED FOR HEADACHE OR MIGRAINE. MAY REPEAT IN 2 HOURS IF HEADACHE PERSISTS OR RECURS.   hydroxychloroquine  (PLAQUENIL ) 200 MG tablet TAKE 1 TABLET BY MOUTH TWICE DAILY, MONDAY THROUGH FRIDAY   Probiotic Product (PROBIOTIC DAILY PO) Take by mouth.  traMADol  (ULTRAM ) 50 MG tablet Take 1 tablet (50 mg total) by mouth every 12 (twelve) hours as needed.   traZODone  (DESYREL ) 50 MG tablet TAKE 3 TABLETS(150 MG) BY MOUTH AT BEDTIME   [DISCONTINUED] amLODipine  (NORVASC ) 5 MG tablet Take 1.5 tablets (7.5 mg total) by mouth daily.     Allergies:   Morphine, Albuterol, Augmentin [amoxicillin-pot clavulanate], Ciprofloxacin, Fish allergy, Gabapentin , Risedronate sodium, Cyclobenzaprine , and Ibandronate sodium   Social History   Socioeconomic History   Marital status: Married    Spouse name: Not on file   Number of children: Not on file   Years of education: Not on file   Highest education level: Bachelor's degree (e.g., BA, AB, BS)  Occupational History   Not on file  Tobacco  Use   Smoking status: Former    Current packs/day: 0.00    Average packs/day: 0.1 packs/day for 6.0 years (0.6 ttl pk-yrs)    Types: Cigarettes    Start date: 12/19/1964    Quit date: 12/19/1970    Years since quitting: 53.0    Passive exposure: Never   Smokeless tobacco: Never   Tobacco comments:    1968-1972, up to 3 cigarettes / day  Vaping Use   Vaping status: Never Used  Substance and Sexual Activity   Alcohol use: No   Drug use: No   Sexual activity: Not on file  Other Topics Concern   Not on file  Social History Narrative   Not on file   Social Drivers of Health   Financial Resource Strain: Low Risk  (10/17/2023)   Overall Financial Resource Strain (CARDIA)    Difficulty of Paying Living Expenses: Not hard at all  Food Insecurity: No Food Insecurity (10/17/2023)   Hunger Vital Sign    Worried About Running Out of Food in the Last Year: Never true    Ran Out of Food in the Last Year: Never true  Transportation Needs: No Transportation Needs (10/17/2023)   PRAPARE - Administrator, Civil Service (Medical): No    Lack of Transportation (Non-Medical): No  Physical Activity: Sufficiently Active (10/17/2023)   Exercise Vital Sign    Days of Exercise per Week: 5 days    Minutes of Exercise per Session: 30 min  Stress: No Stress Concern Present (10/17/2023)   [employer] of Occupational Health - Occupational Stress Questionnaire    Feeling of Stress : Only a little  Social Connections: Socially Integrated (10/17/2023)   Social Connection and Isolation Panel [NHANES]    Frequency of Communication with Friends and Family: More than three times a week    Frequency of Social Gatherings with Friends and Family: Twice a week    Attends Religious Services: More than 4 times per year    Active Member of [employer] or Organizations: Yes    Attends Engineer, Structural: More than 4 times per year    Marital Status: Married     Family History: The patient's  family history includes Atrial fibrillation in her brother and mother; Dementia in her mother; Diabetes in her brother; Healthy in her son; Heart attack in her paternal aunt, paternal grandfather, paternal grandmother, and paternal uncle; Heart attack (age of onset: 21) in her father; Hypertension in her brother and mother; Thyroid  disease in her daughter; Ulcers in her mother. There is no history of Stroke or Breast cancer.  ROS:   Please see the history of present illness.     All other systems reviewed and are  negative.  EKGs/Labs/Other Studies Reviewed:         Recent Labs: 03/10/2023: TSH 2.29 12/06/2023: ALT 12; BUN 23; Creat 1.08; Hemoglobin 11.2; Platelets 284; Potassium 4.6; Sodium 140   Recent Lipid Panel    Component Value Date/Time   CHOL 225 (H) 03/10/2023 1352   TRIG 110.0 03/10/2023 1352   HDL 76.90 03/10/2023 1352   CHOLHDL 3 03/10/2023 1352   VLDL 22.0 03/10/2023 1352   LDLCALC 126 (H) 03/10/2023 1352   LDLDIRECT 122.6 06/03/2013 1054    Physical Exam:   VS:  BP 109/68 (BP Location: Left Arm, Patient Position: Sitting, Cuff Size: Normal)   Pulse 67   Ht 5' 4.5 (1.638 m)   SpO2 97%   BMI 23.15 kg/m  , BMI Body mass index is 23.15 kg/m. GENERAL:  Well appearing HEENT: Pupils equal round and reactive, fundi not visualized, oral mucosa unremarkable NECK:  No jugular venous distention, waveform within normal limits, carotid upstroke brisk and symmetric, no bruits, no thyromegaly LYMPHATICS:  No cervical adenopathy LUNGS:  Clear to auscultation bilaterally HEART:  RRR.  PMI not displaced or sustained,S1 and S2 within normal limits, no S3, no S4, no clicks, no rubs, no murmurs ABD:  Flat, positive bowel sounds normal in frequency in pitch, no bruits, no rebound, no guarding, no midline pulsatile mass, no hepatomegaly, no splenomegaly EXT:  2 plus pulses throughout, no edema, no cyanosis no clubbing SKIN:  No rashes no nodules NEURO:  Cranial nerves II through XII  grossly intact, motor grossly intact throughout PSYCH:  Cognitively intact, oriented to person place and time   ASSESSMENT/PLAN:    HTN - BP well controlled. Continue current antihypertensive regimen Losartan  50mg  BID and Amlodipine  5mg  daily. Refills provided. If SBP routinely <110 consider reducing Amlodipine  dose to 2.5mg . Discussed to monitor BP at home at least 2 hours after medications and sitting for 5-10 minutes.   Anemia - Very mild anemia 11/22/23 RBC 3.81, Hb 11.7, Hct 35.6 and 12/06/23 RBC 3.7, Hb 11.2, Hct 34.8. No melena, hematuria. Consider etiology CKD. Encourage increasing oral iron sources and handout provided.   Autoimmune disease (FM, Raynaud's, inflammatory arthritis)  - Stopped Imuran  3-4 days ago due to fatigue, nausea. Plans to reach out to rheumatology team for next steps.  CKDIIIA - Careful titration of diuretic and antihypertensive.  Renal function stable  by labs 12/06/23 creatinine 1.08, GFR 53.   Screening for Secondary Hypertension:     08/18/2023    3:03 PM  Causes  Drugs/Herbals Screened     - Comments No NSAIDS.  Minimal caffeine.  No EtOH.  No supplements.  Renovascular HTN Screened     - Comments check renal Dopplers  Sleep Apnea Screened     - Comments No snoring.  Thyroid  Disease Screened  Hyperaldosteronism N/A  Pheochromocytoma Screened     - Comments check catecholamin\es/metanephrines  Cushing's Syndrome N/A  Hyperparathyroidism Screened  Coarctation of the Aorta Screened     - Comments BP symmetric    Relevant Labs/Studies:    Latest Ref Rng & Units 12/06/2023   11:58 AM 11/22/2023    9:53 PM 11/22/2023    1:17 PM  Basic Labs  Sodium 135 - 146 mmol/L 140  140  139   Potassium 3.5 - 5.3 mmol/L 4.6  3.8  4.5   Creatinine 0.60 - 1.00 mg/dL 8.91  8.85  8.85        Latest Ref Rng & Units 03/10/2023    1:52 PM  04/11/2022   11:45 AM  Thyroid    TSH 0.35 - 5.50 uIU/mL 2.29  3.67           Latest Ref Rng & Units 08/23/2023   10:29 AM   Metanephrines/Catecholamines   Epinephrine 0 - 62 pg/mL <15   Norepinephrine 0 - 874 pg/mL 651   Dopamine 0 - 48 pg/mL <30   Metanephrines 0.0 - 88.0 pg/mL <25.0   Normetanephrines  0.0 - 285.2 pg/mL 89.5             Disposition:    FU with MD/APP/PharmD in 6 months    Medication Adjustments/Labs and Tests Ordered: Current medicines are reviewed at length with the patient today.  Concerns regarding medicines are outlined above.  No orders of the defined types were placed in this encounter.  Meds ordered this encounter  Medications   amLODipine  (NORVASC ) 5 MG tablet    Sig: Take 1 tablet (5 mg total) by mouth daily.    Dispense:  90 tablet    Refill:  1    Supervising Provider:   CHRISTOPHER, BRIDGETTE [[id number]]   losartan  (COZAAR ) 50 MG tablet    Sig: Take 1 tablet (50 mg total) by mouth 2 (two) times daily.    Dispense:  180 tablet    Refill:  1    Supervising Provider:   LONNI SLAIN [[id number]]     Signed, Reche GORMAN Finder, NP  12/21/2023 11:25 AM    [facility]

## 2023-12-21 ENCOUNTER — Telehealth: Payer: Self-pay | Admitting: *Deleted

## 2023-12-21 ENCOUNTER — Ambulatory Visit (HOSPITAL_BASED_OUTPATIENT_CLINIC_OR_DEPARTMENT_OTHER): Payer: Medicare Other | Admitting: Family

## 2023-12-21 ENCOUNTER — Encounter (HOSPITAL_BASED_OUTPATIENT_CLINIC_OR_DEPARTMENT_OTHER): Payer: Self-pay | Admitting: Family

## 2023-12-21 VITALS — BP 109/68 | HR 67 | Ht 64.5 in

## 2023-12-21 DIAGNOSIS — I10 Essential (primary) hypertension: Secondary | ICD-10-CM

## 2023-12-21 DIAGNOSIS — D649 Anemia, unspecified: Secondary | ICD-10-CM | POA: Diagnosis not present

## 2023-12-21 MED ORDER — AMLODIPINE BESYLATE 5 MG PO TABS
5.0000 mg | ORAL_TABLET | Freq: Every day | ORAL | 1 refills | Status: DC
Start: 2023-12-21 — End: 2024-06-18

## 2023-12-21 MED ORDER — LOSARTAN POTASSIUM 50 MG PO TABS: 50.0000 mg | ORAL_TABLET | Freq: Two times a day (BID) | ORAL | 1 refills | Status: DC

## 2023-12-21 NOTE — Telephone Encounter (Signed)
 error

## 2023-12-21 NOTE — Patient Instructions (Signed)
 Medication Instructions:  Your physician recommends that you continue on your current medications as directed. Please refer to the Current Medication list given to you today.    Follow-Up: Follow up in 6 months in hypertension clinic

## 2023-12-21 NOTE — Telephone Encounter (Signed)
 Patient contacted the office stating she had to stop the Imuran. Patient states she stopped it on 12/17/2023. Patient states after she had her blood work she is now anemic. Patient states she was having nausea which she states was debilitating.

## 2023-12-21 NOTE — Telephone Encounter (Signed)
 Patient advised we can discuss treatment alternatives at her follow up visit if needed

## 2023-12-21 NOTE — Telephone Encounter (Signed)
 Ok we can discuss treatment alternatives at her follow up visit if needed

## 2023-12-22 ENCOUNTER — Ambulatory Visit (INDEPENDENT_AMBULATORY_CARE_PROVIDER_SITE_OTHER): Payer: Medicare Other

## 2023-12-22 VITALS — BP 126/82 | HR 64 | Ht 64.75 in | Wt 137.0 lb

## 2023-12-22 DIAGNOSIS — Z780 Asymptomatic menopausal state: Secondary | ICD-10-CM | POA: Diagnosis not present

## 2023-12-22 DIAGNOSIS — Z0000 Encounter for general adult medical examination without abnormal findings: Secondary | ICD-10-CM | POA: Diagnosis not present

## 2023-12-22 DIAGNOSIS — Z1231 Encounter for screening mammogram for malignant neoplasm of breast: Secondary | ICD-10-CM

## 2023-12-22 NOTE — Progress Notes (Signed)
 Subjective:   [name] is a 78 y.o. female who presents for Medicare Annual (Subsequent) preventive examination.  Visit Complete: In person  Patient Medicare AWV questionnaire was completed by the patient on 12/20/23; I have confirmed that all information answered by patient is correct and no changes since this date.  Cardiac Risk Factors include: advanced age (>5men, >47 women);hypertension;dyslipidemia;Other (see comment), Risk factor comments: hx of breast cancer, Fibromyalgia     Objective:    Today's Vitals   12/20/23 1938 12/22/23 1502  BP:  126/82  Pulse:  64  SpO2:  98%  Weight:  137 lb (62.1 kg)  Height:  5' 4.75 (1.645 m)  PainSc: 5     Body mass index is 22.97 kg/m.     12/22/2023    3:10 PM 11/22/2023    6:15 PM 10/16/2017   10:24 AM 07/18/2016    1:53 PM  Advanced Directives  Does Patient Have a Medical Advance Directive? Yes Yes No No  Type of Estate Agent of [place];Living will Living will;Healthcare Power of Attorney    Copy of Healthcare Power of Attorney in Chart? No - copy requested     Would patient like information on creating a medical advance directive?   No - Patient declined No - patient declined information    Current Medications (verified) Outpatient Encounter Medications as of 12/22/2023  Medication Sig   acetaminophen (TYLENOL) 500 MG tablet Take 1,000 mg by mouth daily as needed for mild pain.   amLODipine  (NORVASC ) 5 MG tablet Take 1 tablet (5 mg total) by mouth daily.   CALCIUM  PO Take by mouth daily.   cholecalciferol (VITAMIN D3) 25 MCG (1000 UNIT) tablet Take 1,000 Units by mouth daily.   CRANBERRY PO Take by mouth.   Cyanocobalamin  (B-12 PO) Take by mouth.   eletriptan  (RELPAX ) 20 MG tablet TAKE 1 TABLET BY MOUTH EVERY 2 HOURS AS NEEDED FOR HEADACHE OR MIGRAINE. MAY REPEAT IN 2 HOURS IF HEADACHE PERSISTS OR RECURS.   hydroxychloroquine  (PLAQUENIL ) 200 MG tablet TAKE 1 TABLET BY MOUTH TWICE DAILY,  MONDAY THROUGH FRIDAY   losartan  (COZAAR ) 50 MG tablet Take 1 tablet (50 mg total) by mouth 2 (two) times daily.   Probiotic Product (PROBIOTIC DAILY PO) Take by mouth.   traMADol  (ULTRAM ) 50 MG tablet Take 1 tablet (50 mg total) by mouth every 12 (twelve) hours as needed.   traZODone  (DESYREL ) 50 MG tablet TAKE 3 TABLETS(150 MG) BY MOUTH AT BEDTIME   No facility-administered encounter medications on file as of 12/22/2023.    Allergies (verified) Morphine, Albuterol, Augmentin [amoxicillin-pot clavulanate], Ciprofloxacin, Fish allergy, Gabapentin , Risedronate sodium, Cyclobenzaprine , and Ibandronate sodium   History: Past Medical History:  Diagnosis Date   Arthritis    Breast cancer (HCC)    R lumpectomy ; radiation; no chemotherapy   Bronchitis    recurrent   Bulging lumbar disc    Cataract    multifocal lens implant   Chronic low back pain    bulging disc & spinal stenosis   Coccygeal fracture (HCC) 05/15/2021   COVID-19    COVID-19 02/2023   Cystitis    Fibromyalgia    Hyperlipemia    Mitral valve prolapse    Osteoporosis    Personal history of radiation therapy    Pneumonia    as OP X 2; no Pneumovax   Ruptured disc, thoracic    Stage 3 chronic kidney disease (HCC)    Vitamin D  deficiency  19 in  01/2009   Past Surgical History:  Procedure Laterality Date   ABDOMINAL HYSTERECTOMY  1990   USO for fibroid , Dr Freya   BREAST LUMPECTOMY Right    BREAST SURGERY  2010   R side lumpectomy; Dr Merrilyn   CATARACT EXTRACTION, BILATERAL  2017   CESAREAN SECTION      G 2 P 2   CYSTECTOMY  2010   EYE SURGERY     eye lid surgery   FOOT SURGERY Right    no colonoscopy     I just don't want to do it. SOC reviewed   Family History  Problem Relation Age of Onset   Atrial fibrillation Mother        S/P cardioversion   Ulcers Mother    Hypertension Mother    Dementia Mother    Heart attack Father 64       died @ 88   Hypertension Brother    Diabetes Brother     Atrial fibrillation Brother    Heart attack Paternal Aunt        < 65   Heart attack Paternal Uncle        > 55   Heart attack Paternal Grandmother        < 65   Heart attack Paternal Grandfather        > 55   Thyroid  disease Daughter        on Synthroid since high school   Healthy Son    Stroke Neg Hx    Breast cancer Neg Hx    Social History   Socioeconomic History   Marital status: Married    Spouse name: Kayla   Number of children: Not on file   Years of education: Not on file   Highest education level: Bachelor's degree (e.g., BA, AB, BS)  Occupational History   Occupation: Retired  Tobacco Use   Smoking status: Former    Current packs/day: 0.00    Average packs/day: 0.1 packs/day for 6.0 years (0.6 ttl pk-yrs)    Types: Cigarettes    Start date: 12/19/1964    Quit date: 12/19/1970    Years since quitting: 53.0    Passive exposure: Never   Smokeless tobacco: Never   Tobacco comments:    1968-1972, up to 3 cigarettes / day  Vaping Use   Vaping status: Never Used  Substance and Sexual Activity   Alcohol use: No   Drug use: No   Sexual activity: Not on file  Other Topics Concern   Not on file  Social History Narrative   Lives with husband   Social Drivers of Health   Financial Resource Strain: Low Risk  (12/22/2023)   Overall Financial Resource Strain (CARDIA)    Difficulty of Paying Living Expenses: Not hard at all  Food Insecurity: No Food Insecurity (12/22/2023)   Hunger Vital Sign    Worried About Running Out of Food in the Last Year: Never true    Ran Out of Food in the Last Year: Never true  Transportation Needs: No Transportation Needs (12/22/2023)   PRAPARE - Administrator, Civil Service (Medical): No    Lack of Transportation (Non-Medical): No  Physical Activity: Sufficiently Active (12/22/2023)   Exercise Vital Sign    Days of Exercise per Week: 5 days    Minutes of Exercise per Session: 60 min  Stress: No Stress Concern Present (12/22/2023)    [employer] of Occupational Health - Occupational Stress  Questionnaire    Feeling of Stress : Only a little  Social Connections: Socially Integrated (12/22/2023)   Social Connection and Isolation Panel [NHANES]    Frequency of Communication with Friends and Family: More than three times a week    Frequency of Social Gatherings with Friends and Family: More than three times a week    Attends Religious Services: More than 4 times per year    Active Member of [employer] or Organizations: Yes    Attends Banker Meetings: Never    Marital Status: Married    Tobacco Counseling Counseling given: Not Answered Tobacco comments: [phone], up to 3 cigarettes / day   Clinical Intake:  Pre-visit preparation completed: Yes  Pain : 0-10 Pain Score: 5  Pain Type: Acute pain Pain Location: Hip Pain Orientation: Right (and leg) Pain Onset: More than a month ago Pain Frequency: Constant Pain Relieving Factors: Hydroyc  Pain Relieving Factors: Hydroyc  BMI - recorded: 22.97 Nutritional Status: BMI of 19-24  Normal Nutritional Risks: None Diabetes: No  How often do you need to have someone help you when you read instructions, pamphlets, or other written materials from your doctor or pharmacy?: 1 - Never  Interpreter Needed?: No  Information entered by :: Emma Birchler, RMA   Activities of Daily Living    12/20/2023    7:38 PM  In your present state of health, do you have any difficulty performing the following activities:  Hearing? 0  Vision? 0  Difficulty concentrating or making decisions? 0  Walking or climbing stairs? 1  Dressing or bathing? 1  Doing errands, shopping? 0  Preparing Food and eating ? N  Using the Toilet? N  In the past six months, have you accidently leaked urine? Y  Do you have problems with loss of bowel control? N  Managing your Medications? N  Managing your Finances? N  Housekeeping or managing your Housekeeping? Y    Patient Care  Team: Geofm Glade PARAS, MD as PCP - General (Internal Medicine) Jason Charleston, MD (Inactive) (Radiation Oncology) Magrinat, Sandria BROCKS, MD (Inactive) (Hematology and Oncology)  Indicate any recent Medical Services you may have received from other than [facility] providers in the past year (date may be approximate).     Assessment:   This is a routine wellness examination for Yulianna.  Hearing/Vision screen Hearing Screening - Comments:: Denies hearing difficulties   Vision Screening - Comments:: Denies vision issues.    Goals Addressed   None   Depression Screen    12/22/2023    3:15 PM 09/11/2023    1:29 PM 03/29/2023   11:24 AM 03/29/2023   11:23 AM 08/09/2022    2:00 PM 08/09/2022    1:53 PM 04/15/2022    1:03 PM  PHQ 2/9 Scores  PHQ - 2 Score 0 0 0 0 1 0 0  PHQ- 9 Score 4 2 1  2       Fall Risk    12/20/2023    7:38 PM 09/11/2023    1:28 PM 04/17/2023    3:21 PM 03/29/2023   11:23 AM 08/09/2022    1:52 PM  Fall Risk   Falls in the past year? 1 0 0 0 1  Number falls in past yr: 1 0 0 0 1  Injury with Fall? 0 0 0 0 0  Risk for fall due to :  No Fall Risks No Fall Risks No Fall Risks   Follow up Falls evaluation completed;Falls prevention discussed Falls evaluation  completed Falls evaluation completed Falls evaluation completed     MEDICARE RISK AT HOME: Medicare Risk at Home Any stairs in or around the home?: (Patient-Rptd) Yes If so, are there any without handrails?: (Patient-Rptd) No Home free of loose throw rugs in walkways, pet beds, electrical cords, etc?: (Patient-Rptd) Yes Adequate lighting in your home to reduce risk of falls?: (Patient-Rptd) Yes Life alert?: (Patient-Rptd) No Use of a cane, walker or w/c?: (Patient-Rptd) No Grab bars in the bathroom?: (Patient-Rptd) No Shower chair or bench in shower?: (Patient-Rptd) Yes Elevated toilet seat or a handicapped toilet?: (Patient-Rptd) Yes  TIMED UP AND GO:  Was the test performed?  Yes  Length of time to ambulate 10  feet: 10 sec Gait steady and fast without use of assistive device    Cognitive Function:        12/22/2023    3:01 PM  6CIT Screen  What Year? 0 points  What month? 0 points  What time? 0 points  Count back from 20 0 points  Months in reverse 0 points  Repeat phrase 0 points  Total Score 0 points    Immunizations Immunization History  Administered Date(s) Administered   Fluad Quad(high Dose 65+) 10/02/2019, 09/09/2022   Fluad Trivalent(High Dose 65+) 09/11/2023   PFIZER(Purple Top)SARS-COV-2 Vaccination 01/08/2020, 01/27/2020, 10/17/2020   Td 12/20/1999    TDAP status: Due, Education has been provided regarding the importance of this vaccine. Advised may receive this vaccine at local pharmacy or Health Dept. Aware to provide a copy of the vaccination record if obtained from local pharmacy or Health Dept. Verbalized acceptance and understanding.  Flu Vaccine status: Up to date  Pneumococcal vaccine status: Declined,  Education has been provided regarding the importance of this vaccine but patient still declined. Advised may receive this vaccine at local pharmacy or Health Dept. Aware to provide a copy of the vaccination record if obtained from local pharmacy or Health Dept. Verbalized acceptance and understanding.   Covid-19 vaccine status: Declined, Education has been provided regarding the importance of this vaccine but patient still declined. Advised may receive this vaccine at local pharmacy or Health Dept.or vaccine clinic. Aware to provide a copy of the vaccination record if obtained from local pharmacy or Health Dept. Verbalized acceptance and understanding.  Qualifies for Shingles Vaccine? Yes   Zostavax completed No   Shingrix Completed?: No.    Education has been provided regarding the importance of this vaccine. Patient has been advised to call insurance company to determine out of pocket expense if they have not yet received this vaccine. Advised may also receive  vaccine at local pharmacy or Health Dept. Verbalized acceptance and understanding.  Screening Tests Health Maintenance  Topic Date Due   DTaP/Tdap/Td (2 - Tdap) 12/19/2009   Pneumonia Vaccine 12+ Years old (1 of 1 - PCV) Never done   COVID-19 Vaccine (4 - 2024-25 season) 08/20/2023   Medicare Annual Wellness (AWV)  12/21/2024   DEXA SCAN  09/11/2025   INFLUENZA VACCINE  Completed   Hepatitis C Screening  Completed   HPV VACCINES  Aged Out   Zoster Vaccines- Shingrix  Discontinued    Health Maintenance  Health Maintenance Due  Topic Date Due   DTaP/Tdap/Td (2 - Tdap) 12/19/2009   Pneumonia Vaccine 41+ Years old (1 of 1 - PCV) Never done   COVID-19 Vaccine (4 - 2024-25 season) 08/20/2023    Colorectal cancer screening: No longer required.   Mammogram status: Ordered 12/21/2022. Pt provided with contact info  and advised to call to schedule appt.   Bone Density status: Ordered 12/22/2023. Pt provided with contact info and advised to call to schedule appt.  Lung Cancer Screening: (Low Dose CT Chest recommended if Age 27-80 years, 20 pack-year currently smoking OR have quit w/in 15years.) does not qualify.   Lung Cancer Screening Referral: N/A  Additional Screening:  Hepatitis C Screening: does qualify; Completed 10/16/207  Vision Screening: Recommended annual ophthalmology exams for early detection of glaucoma and other disorders of the eye. Is the patient up to date with their annual eye exam?  Yes  Who is the provider or what is the name of the office in which the patient attends annual eye exams? [facility] If pt is not established with a provider, would they like to be referred to a provider to establish care? No .   Dental Screening: Recommended annual dental exams for proper oral hygiene   Community Resource Referral / Chronic Care Management: CRR required this visit?  No   CCM required this visit?  No     Plan:     I have personally reviewed and  noted the following in the patient's chart:   Medical and social history Use of alcohol, tobacco or illicit drugs  Current medications and supplements including opioid prescriptions. Patient is currently taking opioid prescriptions. Information provided to patient regarding non-opioid alternatives. Patient advised to discuss non-opioid treatment plan with their provider. Functional ability and status Nutritional status Physical activity Advanced directives List of other physicians Hospitalizations, surgeries, and ER visits in previous 12 months Vitals Screenings to include cognitive, depression, and falls Referrals and appointments  In addition, I have reviewed and discussed with patient certain preventive protocols, quality metrics, and best practice recommendations. A written personalized care plan for preventive services as well as general preventive health recommendations were provided to patient.     Xzavior Reinig L Zamauri Nez, CMA   12/22/2023   After Visit Summary: (MyChart) Due to this being a telephonic visit, the after visit summary with patients personalized plan was offered to patient via MyChart   Nurse Notes: Patient is due for a Tdap.  She is also due for a DEXA and a mammogram.  I have placed to order for the DEXA and mammogram.  Patient had no concerns to address today.

## 2023-12-22 NOTE — Patient Instructions (Signed)
 [name] , Thank you for taking time to come for your Medicare Wellness Visit. I appreciate your ongoing commitment to your health goals. Please review the following plan we discussed and let me know if I can assist you in the future.   Referrals/Orders/Follow-Ups/Clinician Recommendations: You are due for a Tetanus vaccine.  You have an order for:   mammogram [x]   Bone Density     Please call for appointment:  [facility] [address] [place], [place] [address] [phone]    Make sure to wear two-piece clothing.  No lotions, powders, or deodorants the day of the appointment. Make sure to bring picture ID and insurance card.  Bring list of medications you are currently taking including any supplements.   Schedule your [facility] screening mammogram through MyChart!   Log into your MyChart account.  Go to 'Visit' (or 'Appointments' if on mobile App) --> Schedule an Appointment  Under 'Select a Reason for Visit' choose the Mammogram Screening option.  Complete the pre-visit questions and select the time and place that best fits your schedule.    This is a list of the screening recommended for you and due dates:  Health Maintenance  Topic Date Due   COVID-19 Vaccine (4 - 2024-25 season) 01/07/2024*   DTaP/Tdap/Td vaccine (2 - Tdap) 04/17/2024*   Pneumonia Vaccine (1 of 1 - PCV) 12/21/2024*   Medicare Annual Wellness Visit  12/21/2024   DEXA scan (bone density measurement)  09/11/2025   Flu Shot  Completed   Hepatitis C Screening  Completed   HPV Vaccine  Aged Out   Zoster (Shingles) Vaccine  Discontinued  *Topic was postponed. The date shown is not the original due date.    Advanced directives: (Copy Requested) Please bring a copy of your health care power of attorney and living will to the office to be added to your chart at your convenience.  Next Medicare Annual Wellness Visit scheduled for next year: Yes

## 2023-12-28 ENCOUNTER — Other Ambulatory Visit: Payer: Self-pay | Admitting: Internal Medicine

## 2024-01-09 NOTE — Progress Notes (Signed)
 Office Visit Note  Patient: [name]             Date of Birth: March 10, 1946           MRN: [id number]             PCP: Geofm Glade PARAS, MD Referring: Geofm Glade PARAS, MD Visit Date: 01/23/2024 Occupation: @GUAROCC @  Subjective:  Discontinued Imuran   History of Present Illness: [name] is a 78 y.o. female with history of autoimmune disease and osteoarthritis.  Patient remains on Plaquenil  200 mg 1 tablet by mouth twice daily Monday through Friday.  After her last office visit she initiated Imuran  but had to discontinue due to nausea, GI upset, and anemia.  Patient reports that she took Imuran  for about 1 month but had not yet noticed any clinical benefit while taking it.  She has been taking Tylenol 2 tablets in the morning and occasionally 2 tablets in the evenings for pain relief.  Her primary concern remains her level of fatigue.  She continues to experience intermittent pain and swelling involving her left knee.  She is also had swelling in the left ankle but denies any pain, redness, or tenderness of the left ankle.  Patient states that the edema in the left ankle is worse in the evenings. She has intermittent symptoms of Raynaud's phenomenon but denies any digital ulcerations.  She has not had any recent rashes, oral or nasal ulcerations, or swollen lymph nodes.  She continues to have chronic sicca symptoms.   Activities of Daily Living:  Patient reports morning stiffness for several hours.   Patient Reports nocturnal pain.  Difficulty dressing/grooming: Denies Difficulty climbing stairs: Reports Difficulty getting out of chair: Reports Difficulty using hands for taps, buttons, cutlery, and/or writing: Reports  Review of Systems  Constitutional:  Positive for fatigue.  HENT:  Positive for mouth dryness. Negative for mouth sores and nose dryness.   Eyes:  Positive for dryness. Negative for pain.  Respiratory:  Negative for shortness of breath and difficulty  breathing.   Cardiovascular:  Positive for palpitations. Negative for chest pain.  Gastrointestinal:  Negative for blood in stool, constipation and diarrhea.  Endocrine: Negative for increased urination.  Genitourinary:  Negative for involuntary urination.  Musculoskeletal:  Positive for joint pain, gait problem, joint pain, joint swelling, myalgias, muscle weakness, morning stiffness, muscle tenderness and myalgias.  Skin:  Positive for color change. Negative for rash, hair loss and sensitivity to sunlight.  Allergic/Immunologic: Negative for susceptible to infections.  Neurological:  Negative for dizziness and headaches.  Hematological:  Negative for swollen glands.  Psychiatric/Behavioral:  Negative for depressed mood and sleep disturbance. The patient is not nervous/anxious.     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   Other chest pain 03/10/2020   Shingles 11/19/2019   Pain in both knees 07/09/2019   Right arm pain 02/26/2019   Acute pain of left knee 08/27/2018   Hand arthritis 06/13/2018  Trigger middle finger of right hand 06/13/2018   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   Intermittent palpitations 10/11/2017   Migraine 12/30/2015   Upper airway cough syndrome 10/29/2015   Osteoporosis 06/06/2011   Vitamin D  deficiency 03/22/2010   BREAST CANCER, HX OF 03/22/2010   HYPERLIPIDEMIA 09/28/2007   Fibromyalgia 05/24/2007    Past Medical History:  Diagnosis  Date   Arthritis    Breast cancer (HCC)    R lumpectomy ; radiation; no chemotherapy   Bronchitis    recurrent   Bulging lumbar disc    Cataract    multifocal lens implant   Chronic low back pain    bulging disc & spinal stenosis   Coccygeal fracture (HCC) 05/15/2021   COVID-19    COVID-19 02/2023   Cystitis    Fibromyalgia    Hyperlipemia    Mitral valve prolapse    Osteoporosis    Personal history of radiation therapy    Pneumonia    as OP X 2; no Pneumovax   Ruptured disc, thoracic    Stage 3 chronic kidney disease (HCC)    Vitamin D  deficiency    19 in  01/2009    Family History  Problem Relation Age of Onset   Atrial fibrillation Mother        S/P cardioversion   Ulcers Mother    Hypertension Mother    Dementia Mother    Heart attack Father 78       died @ 67   Hypertension Brother    Diabetes Brother    Atrial fibrillation Brother    Heart attack Paternal Aunt        < 65   Heart attack Paternal Uncle        > 55   Heart attack Paternal Grandmother        < 65   Heart attack Paternal Grandfather        > 55   Thyroid  disease Daughter        on Synthroid since high school   Healthy Son    Stroke Neg Hx    Breast cancer Neg Hx    Past Surgical History:  Procedure Laterality Date   ABDOMINAL HYSTERECTOMY  1990   [facility] for fibroid , Dr Freya   BREAST LUMPECTOMY Right    BREAST SURGERY  2010   R side lumpectomy; Dr Merrilyn   CATARACT EXTRACTION, BILATERAL  2017   CESAREAN SECTION      G 2 P 2   CYSTECTOMY  2010   EYE SURGERY     eye lid surgery   FOOT SURGERY Right    no colonoscopy     I just don't want to do it. SOC reviewed   Social History   Social History Narrative   Lives with husband   Immunization History  Administered Date(s) Administered   Fluad Quad(high Dose 65+) 10/02/2019, 09/09/2022   Fluad Trivalent(High Dose 65+) 09/11/2023   PFIZER(Purple Top)SARS-COV-2 Vaccination 01/08/2020, 01/27/2020, 10/17/2020   Td 12/20/1999      Objective: Vital Signs: BP (!) 147/84 (BP Location: Left Arm, Patient Position: Sitting, Cuff Size: Normal)   Pulse 69   Resp 13   Ht 5' 4 (1.626 m)   Wt 137 lb (62.1 kg)   BMI 23.52 kg/m    Physical Exam Vitals and nursing note reviewed.  Constitutional:      Appearance: She is well-developed.  HENT:     Head: Normocephalic and atraumatic.  Eyes:  Conjunctiva/sclera: Conjunctivae normal.  Cardiovascular:     Rate and Rhythm: Normal rate and regular rhythm.     Heart sounds: Normal heart sounds.  Pulmonary:     Effort: Pulmonary effort is normal.     Breath sounds: Normal breath sounds.  Abdominal:     General: Bowel sounds are normal.     Palpations: Abdomen is soft.  Musculoskeletal:     Cervical back: Normal range of motion.  Lymphadenopathy:     Cervical: No cervical adenopathy.  Skin:    General: Skin is warm and dry.     Capillary Refill: Capillary refill takes less than 2 seconds.  Neurological:     Mental Status: She is alert and oriented to person, place, and time.  Psychiatric:        Behavior: Behavior normal.      Musculoskeletal Exam: C-spine, thoracic spine, lumbar spine have good range of motion.  Shoulder joints, elbow joints, wrist joints, MCPs, PIPs, DIPs have good range of motion with no synovitis.  Synovial thickening of the right second MCP joint.  Thickening of the left second MCP joint noted.  CMC, PIP, DIP thickening consistent with osteoarthritis of both hands.  Right hip is slightly limited range of motion.  Small to moderate effusion in the left knee.  Good range of motion of the right knee with no warmth or effusion.  Ankle joints have good range of motion with no joint tenderness or synovitis.  Pitting edema noted in bilateral lower extremities, more severe in the left than right.  CDAI Exam: CDAI Score: -- Patient Global: --; Provider Global: -- Swollen: --; Tender: -- Joint Exam 01/23/2024   No joint exam has been documented for  this visit   There is currently no information documented on the homunculus. Go to the Rheumatology activity and complete the homunculus joint exam.  Investigation: No additional findings.  Imaging: MM 3D SCREENING MAMMOGRAM BILATERAL BREAST Result Date: 01/13/2024 CLINICAL DATA:  Screening. EXAM: DIGITAL SCREENING BILATERAL MAMMOGRAM WITH TOMOSYNTHESIS AND CAD TECHNIQUE: Bilateral screening digital craniocaudal and mediolateral oblique mammograms were obtained. Bilateral screening digital breast tomosynthesis was performed. The images were evaluated with computer-aided detection. COMPARISON:  Previous exam(s). ACR Breast Density Category b: There are scattered areas of fibroglandular density. FINDINGS: There are no findings suspicious for malignancy. IMPRESSION: No mammographic evidence of malignancy. A result letter of this screening mammogram will be mailed directly to the patient. RECOMMENDATION: Screening mammogram in one year. (Code:SM-B-01Y) BI-RADS CATEGORY  1: Negative. Electronically Signed   By: Rosaline Collet M.D.   On: 01/13/2024 09:33    Recent Labs: Lab Results  Component Value Date   WBC 4.3 12/06/2023   HGB 11.2 (L) 12/06/2023   PLT 284 12/06/2023   NA 140 12/06/2023   K 4.6 12/06/2023   CL 107 12/06/2023   CO2 24 12/06/2023   GLUCOSE 98 12/06/2023   BUN 23 12/06/2023   CREATININE 1.08 (H) 12/06/2023   BILITOT 0.4 12/06/2023   ALKPHOS 65 03/10/2023   AST 15 12/06/2023   ALT 12 12/06/2023   PROT 6.5 12/06/2023   ALBUMIN 4.7 03/10/2023   CALCIUM  9.6 12/06/2023   GFRAA 58 (L) 05/24/2021   QFTBGOLDPLUS NEGATIVE 10/27/2021    Speciality Comments: PLQ Eye Exam: 11/30/2023 WNL @ [facility] Follow up in 6 months.  Procedures:  No procedures performed Allergies: Morphine, Albuterol, Augmentin [amoxicillin-pot clavulanate], Ciprofloxacin, Fish allergy, Gabapentin , Risedronate sodium, Cyclobenzaprine , and Ibandronate sodium   Assessment / Plan:  Visit Diagnoses: Autoimmune disease (HCC) - Positive ANA, dry eyes, hair loss, Raynaud's, inflammatory arthritis: Patient continues to experience chronic fatigue, arthralgias, sicca symptoms, and intermittent symptoms of Raynaud's phenomenon.  She is currently taking Plaquenil  200 mg 1 tablet by mouth twice daily Monday through Friday.  After her last office visit she was initiated on low-dose Imuran  50 mg 1 tablet daily.  Patient experienced increased nausea as well as anemia while taking Imuran  and has discontinued.  She took Imuran  for about 1 month but did not notice any clinical benefit while taking it.  Patient continues to experience swelling in the left knee but has declined a cortisone injection today.  She has not noticed any other new or worsening symptoms since her last office visit other than persistent fatigue. Patient has been restarted on amlodipine  5 mg 1 tablet daily.  No signs of sclerodactyly or digital ulcerations were noted on examination today. Lab work from 11/22/2023 was reviewed today in the office: Complements within normal limits, ANA 1: 80 nuclear, dense fine speckled, double-stranded DNA negative, ESR 11.  The following lab work will be obtained today for further evaluation.  Patient will remain on Plaquenil  as monotherapy for now.  She was advised to notify us  if she develops any new or worsening symptoms.  She will follow-up in the office in 5 months or sooner if needed. - Plan: CBC with Differential/Platelet, COMPLETE METABOLIC PANEL WITH GFR, Anti-DNA antibody, double-stranded, C3 and C4, Sedimentation rate, Protein / creatinine ratio, urine  High risk medication use - Plaquenil  200 mg 1 tablet by mouth twice daily Monday to Friday.  Intolerance of imuran -nausea and anemia CBC and CMP updated on 12/06/23.  Orders for CBC and CMP were released today.   PLQ Eye Exam: 11/30/2023 WNL @ [facility] Follow up in 6 months.  - Plan: CBC with Differential/Platelet,  COMPLETE METABOLIC PANEL WITH GFR  Chronic right shoulder pain: She has good ROM of the right shoulder with some discomfort and tenderness.    Primary osteoarthritis of both hands: She has CMC, PIP, DIP thickening consistent with osteoarthritis of both hands.  Synovial thickening of bilateral second MCP joints, right most prominent.    Trigger middle finger of right hand: Patient continues to experience intermittent tenderness and locking in the right middle finger.  Primary osteoarthritis of right hip: Slightly limited range of motion.  Effusion, left knee: Small to moderate effusion in the left knee noted.  Patient declined a cortisone injection today.  Primary osteoarthritis of both feet: She is not experiencing any increased discomfort in her feet.  She has pitting edema in bilateral lower extremities, most prominent at in the left.  Discussed the importance of wearing compression.  She was advised to follow-up with her PCP for further evaluation and management.  DDD (degenerative disc disease), cervical - X-rays from March 29, 2023 showed degenerative changes and posterior osteophytes at C6- C7.  Evaluated by Dr. Beuford in the past.  Chronic pain and stiffness.  Limited range of motion with lateral rotation.  Degeneration of intervertebral disc of lumbar region without discogenic back pain or lower extremity pain: She continues to experience intermittent discomfort and stiffness in her lower back.  Age-related osteoporosis without current pathological fracture: DEXA updated on 09/12/2023: LFN T-score -2.4, RFN T-score -2.3, lumbar spine T-score -1.6.   Previous DEXA 04/23/21: left femoral neck T score -2.7.  Intolerance to Fosamax and Boniva in the past. She is taking vitamin D  1000 units daily and a  calcium  supplement daily.  Vitamin D  deficiency: She is taking vitamin D  1000 units daily.  Closed fracture of coccyx with routine healing, subsequent encounter  Fibromyalgia: She continues  to take tramadol  50 mg 1 tablet every 12 hours as needed for pain relief and remains on trazodone  50 mg 3 tablets at bedtime for insomnia.  Other fatigue: Patient continues to have chronic fatigue on a daily basis.  Recent lab results were consistent with anemia.  Plan to recheck the following lab work today for further evaluation.  Other medical conditions are listed as follows:  Primary hypertension: Blood pressure is 147/84 today in the office.  History of breast cancer  Vitamin B12 deficiency  Hx of migraines  History of shingles  Orders: Orders Placed This Encounter  Procedures   CBC with Differential/Platelet   COMPLETE METABOLIC PANEL WITH GFR   Anti-DNA antibody, double-stranded   C3 and C4   Sedimentation rate   Protein / creatinine ratio, urine   No orders of the defined types were placed in this encounter.    Follow-Up Instructions: Return in about 5 months (around 06/21/2024) for Autoimmune Disease.   Waddell CHRISTELLA Craze, PA-C  Note - This record has been created using Dragon software.  Chart creation errors have been sought, but may not always  have been located. Such creation errors do not reflect on  the standard of medical care.

## 2024-01-11 ENCOUNTER — Ambulatory Visit
Admission: RE | Admit: 2024-01-11 | Discharge: 2024-01-11 | Disposition: A | Discharge status: Home / Self Care | Payer: Medicare Other | Source: Ambulatory Visit | Attending: Nurse Practitioner | Admitting: Nurse Practitioner

## 2024-01-11 DIAGNOSIS — Z1231 Encounter for screening mammogram for malignant neoplasm of breast: Secondary | ICD-10-CM

## 2024-01-14 ENCOUNTER — Encounter: Payer: Self-pay | Admitting: Nurse Practitioner

## 2024-01-23 ENCOUNTER — Ambulatory Visit: Payer: Medicare Other | Attending: Physician Assistant | Admitting: Physician Assistant

## 2024-01-23 ENCOUNTER — Encounter: Payer: Self-pay | Admitting: Physician Assistant

## 2024-01-23 VITALS — BP 147/84 | HR 69 | Resp 13 | Ht 64.0 in | Wt 137.0 lb

## 2024-01-23 DIAGNOSIS — I10 Essential (primary) hypertension: Secondary | ICD-10-CM | POA: Diagnosis present

## 2024-01-23 DIAGNOSIS — M19072 Primary osteoarthritis, left ankle and foot: Secondary | ICD-10-CM | POA: Diagnosis present

## 2024-01-23 DIAGNOSIS — M51369 Other intervertebral disc degeneration, lumbar region without mention of lumbar back pain or lower extremity pain: Secondary | ICD-10-CM | POA: Insufficient documentation

## 2024-01-23 DIAGNOSIS — M1611 Unilateral primary osteoarthritis, right hip: Secondary | ICD-10-CM | POA: Insufficient documentation

## 2024-01-23 DIAGNOSIS — M359 Systemic involvement of connective tissue, unspecified: Secondary | ICD-10-CM | POA: Insufficient documentation

## 2024-01-23 DIAGNOSIS — E538 Deficiency of other specified B group vitamins: Secondary | ICD-10-CM | POA: Insufficient documentation

## 2024-01-23 DIAGNOSIS — M19071 Primary osteoarthritis, right ankle and foot: Secondary | ICD-10-CM | POA: Insufficient documentation

## 2024-01-23 DIAGNOSIS — M25511 Pain in right shoulder: Secondary | ICD-10-CM | POA: Insufficient documentation

## 2024-01-23 DIAGNOSIS — M19042 Primary osteoarthritis, left hand: Secondary | ICD-10-CM | POA: Diagnosis present

## 2024-01-23 DIAGNOSIS — M25462 Effusion, left knee: Secondary | ICD-10-CM | POA: Insufficient documentation

## 2024-01-23 DIAGNOSIS — R5383 Other fatigue: Secondary | ICD-10-CM | POA: Insufficient documentation

## 2024-01-23 DIAGNOSIS — Z8619 Personal history of other infectious and parasitic diseases: Secondary | ICD-10-CM | POA: Diagnosis present

## 2024-01-23 DIAGNOSIS — M65331 Trigger finger, right middle finger: Secondary | ICD-10-CM | POA: Diagnosis present

## 2024-01-23 DIAGNOSIS — M19041 Primary osteoarthritis, right hand: Secondary | ICD-10-CM | POA: Diagnosis present

## 2024-01-23 DIAGNOSIS — Z853 Personal history of malignant neoplasm of breast: Secondary | ICD-10-CM | POA: Insufficient documentation

## 2024-01-23 DIAGNOSIS — Z8669 Personal history of other diseases of the nervous system and sense organs: Secondary | ICD-10-CM | POA: Diagnosis present

## 2024-01-23 DIAGNOSIS — G8929 Other chronic pain: Secondary | ICD-10-CM | POA: Diagnosis present

## 2024-01-23 DIAGNOSIS — M797 Fibromyalgia: Secondary | ICD-10-CM | POA: Insufficient documentation

## 2024-01-23 DIAGNOSIS — M5030 Other cervical disc degeneration, unspecified cervical region: Secondary | ICD-10-CM | POA: Diagnosis present

## 2024-01-23 DIAGNOSIS — S322XXD Fracture of coccyx, subsequent encounter for fracture with routine healing: Secondary | ICD-10-CM | POA: Diagnosis present

## 2024-01-23 DIAGNOSIS — E559 Vitamin D deficiency, unspecified: Secondary | ICD-10-CM | POA: Insufficient documentation

## 2024-01-23 DIAGNOSIS — M810 Age-related osteoporosis without current pathological fracture: Secondary | ICD-10-CM | POA: Diagnosis present

## 2024-01-23 DIAGNOSIS — Z79899 Other long term (current) drug therapy: Secondary | ICD-10-CM | POA: Insufficient documentation

## 2024-01-24 ENCOUNTER — Other Ambulatory Visit: Payer: Self-pay | Admitting: Physician Assistant

## 2024-01-24 NOTE — Progress Notes (Signed)
 RBC count remains borderline low but has improved.  Hemoglobin and hematocrit have returned to WNL.  Rest of CBC WNL.  ESR WNL Complements Wnl  Creatinine remains slightly elevated-1.07 and GFR is low-54. Avoid the use of NSAIDs.

## 2024-01-25 LAB — COMPLETE METABOLIC PANEL WITH GFR
AG Ratio: 2.2 (calc) (ref 1.0–2.5)
ALT: 16 U/L (ref 6–29)
AST: 18 U/L (ref 10–35)
Albumin: 4.8 g/dL (ref 3.6–5.1)
Alkaline phosphatase (APISO): 78 U/L (ref 37–153)
BUN/Creatinine Ratio: 17 (calc) (ref 6–22)
BUN: 18 mg/dL (ref 7–25)
CO2: 24 mmol/L (ref 20–32)
Calcium: 10.2 mg/dL (ref 8.6–10.4)
Chloride: 106 mmol/L (ref 98–110)
Creat: 1.07 mg/dL — ABNORMAL HIGH (ref 0.60–1.00)
Globulin: 2.2 g/dL (ref 1.9–3.7)
Glucose, Bld: 91 mg/dL (ref 65–99)
Potassium: 4.7 mmol/L (ref 3.5–5.3)
Sodium: 139 mmol/L (ref 135–146)
Total Bilirubin: 0.3 mg/dL (ref 0.2–1.2)
Total Protein: 7 g/dL (ref 6.1–8.1)
eGFR: 54 mL/min/{1.73_m2} — ABNORMAL LOW (ref >=60)

## 2024-01-25 LAB — CBC WITH DIFFERENTIAL/PLATELET
Absolute Lymphocytes: 1166 {cells}/uL (ref 850–3900)
Absolute Monocytes: 311 {cells}/uL (ref 200–950)
Basophils Absolute: 50 {cells}/uL (ref 0–200)
Basophils Relative: 1.1 %
Eosinophils Absolute: 32 {cells}/uL (ref 15–500)
Eosinophils Relative: 0.7 %
HCT: 35.7 % (ref 35.0–45.0)
Hemoglobin: 11.9 g/dL (ref 11.7–15.5)
MCH: 31.4 pg (ref 27.0–33.0)
MCHC: 33.3 g/dL (ref 32.0–36.0)
MCV: 94.2 fL (ref 80.0–100.0)
MPV: 9.7 fL (ref 7.5–12.5)
Monocytes Relative: 6.9 %
Neutro Abs: 2943 {cells}/uL (ref 1500–7800)
Neutrophils Relative %: 65.4 %
Platelets: 287 10*3/uL (ref 140–400)
RBC: 3.79 10*6/uL — ABNORMAL LOW (ref 3.80–5.10)
RDW: 12.9 % (ref 11.0–15.0)
Total Lymphocyte: 25.9 %
WBC: 4.5 10*3/uL (ref 3.8–10.8)

## 2024-01-25 LAB — PROTEIN / CREATININE RATIO, URINE
Creatinine, Urine: 23 mg/dL (ref 20–275)
Total Protein, Urine: <4 mg/dL — ABNORMAL LOW (ref 5–24)

## 2024-01-25 LAB — C3 AND C4
C3 Complement: 138 mg/dL (ref 83–193)
C4 Complement: 27 mg/dL (ref 15–57)

## 2024-01-25 LAB — SEDIMENTATION RATE: Sed Rate: 6 mm/h (ref 0–30)

## 2024-01-25 LAB — ANTI-DNA ANTIBODY, DOUBLE-STRANDED: ds DNA Ab: <1 [IU]/mL

## 2024-01-25 NOTE — Progress Notes (Signed)
No proteinuria

## 2024-01-25 NOTE — Progress Notes (Signed)
 dsDNA negative. Labs are not consistent with a flare at this time.

## 2024-02-01 ENCOUNTER — Encounter: Payer: Self-pay | Admitting: Internal Medicine

## 2024-02-08 NOTE — Telephone Encounter (Signed)
 Addressed via 12/21/23 visit.   Alver Sorrow, NP

## 2024-03-10 ENCOUNTER — Encounter: Payer: Self-pay | Admitting: Internal Medicine

## 2024-03-10 NOTE — Progress Notes (Unsigned)
 Subjective:    Patient ID: [name], female    DOB: 03-Aug-1946, 78 y.o.   MRN: [id number]     HPI [name] is here for follow up of her chronic medical problems.  No labs needed  Her husband was diagnosed with prostate cancer and they may be going up to [facility] for treatment possibly which could be 6 weeks of treatment.  Has had flare of arthritis.  Pain can be significant at times.  Gets fatigued easily and this does not seem to match her arthritic pain so she feels like it is separate from her arthritic flares.  BP 161/94, 104/67, 122/74, 93/67  HR has been high -80s-100.  Typically the blood pressure is low and the heart rate is elevated.  The blood pressure does not always seem to be low or high during certain times of the day  Medications and allergies reviewed with patient and updated if appropriate.  Current Outpatient Medications on File Prior to Visit  Medication Sig Dispense Refill   acetaminophen (TYLENOL) 500 MG tablet Take 1,000 mg by mouth daily as needed for mild pain.     amLODipine (NORVASC) 5 MG tablet Take 1 tablet (5 mg total) by mouth daily. 90 tablet 1   CALCIUM PO Take by mouth daily.     cholecalciferol (VITAMIN D3) 25 MCG (1000 UNIT) tablet Take 1,000 Units by mouth daily.     CRANBERRY PO Take by mouth.     Cyanocobalamin (B-12 PO) Take by mouth.     eletriptan (RELPAX) 20 MG tablet TAKE 1 TABLET BY MOUTH EVERY 2 HOURS AS NEEDED FOR HEADACHE OR MIGRAINE. MAY REPEAT IN 2 HOURS IF HEADACHE PERSISTS OR RECURS. 9 tablet 1   hydroxychloroquine (PLAQUENIL) 200 MG tablet TAKE 1 TABLET BY MOUTH TWICE DAILY, MONDAY THROUGH FRIDAY 120 tablet 0   losartan (COZAAR) 50 MG tablet Take 1 tablet (50 mg total) by mouth 2 (two) times daily. 180 tablet 1   Probiotic Product (PROBIOTIC DAILY PO) Take by mouth.     RESTASIS 0.05 % ophthalmic emulsion SMARTSIG:1 Drop(s) In Eye(s) Every 12 Hours     traZODone (DESYREL) 50 MG tablet TAKE 3 TABLETS(150 MG) BY  MOUTH AT BEDTIME 270 tablet 1   No current facility-administered medications on file prior to visit.     Review of Systems  Constitutional:  Positive for fatigue. Negative for fever.  Respiratory:  Positive for shortness of breath (with BP at times). Negative for cough and wheezing.   Cardiovascular:  Positive for palpitations (occ - not new). Negative for chest pain and leg swelling.  Musculoskeletal:  Positive for joint swelling (left ankle).  Neurological:  Negative for light-headedness and headaches.       Objective:   Vitals:   03/11/24 1318  BP: 138/72  Pulse: 63  Temp: 98.2 F (36.8 C)  SpO2: 96%   BP Readings from Last 3 Encounters:  03/11/24 138/72  01/23/24 (!) 147/84  12/22/23 126/82   Wt Readings from Last 3 Encounters:  01/23/24 137 lb (62.1 kg)  12/22/23 137 lb (62.1 kg)  11/22/23 137 lb (62.1 kg)   Body mass index is 23.52 kg/m.    Physical Exam Constitutional:      General: She is not in acute distress.    Appearance: Normal appearance.  HENT:     Head: Normocephalic and atraumatic.  Eyes:     Conjunctiva/sclera: Conjunctivae normal.  Cardiovascular:     Rate and Rhythm:  Normal rate and regular rhythm.     Heart sounds: Normal heart sounds.  Pulmonary:     Effort: Pulmonary effort is normal. No respiratory distress.     Breath sounds: Normal breath sounds. No wheezing.  Musculoskeletal:     Cervical back: Neck supple.     Right lower leg: No edema.     Left lower leg: No edema.  Lymphadenopathy:     Cervical: No cervical adenopathy.  Skin:    General: Skin is warm and dry.     Findings: No rash.  Neurological:     Mental Status: She is alert. Mental status is at baseline.  Psychiatric:        Mood and Affect: Mood normal.        Behavior: Behavior normal.        Lab Results  Component Value Date   WBC 4.5 01/23/2024   HGB 11.9 01/23/2024   HCT 35.7 01/23/2024   PLT 287 01/23/2024   GLUCOSE 91 01/23/2024   CHOL 225 (H)  03/10/2023   TRIG 110.0 03/10/2023   HDL 76.90 03/10/2023   LDLDIRECT 122.6 06/03/2013   LDLCALC 126 (H) 03/10/2023   ALT 16 01/23/2024   AST 18 01/23/2024   NA 139 01/23/2024   K 4.7 01/23/2024   CL 106 01/23/2024   CREATININE 1.07 (H) 01/23/2024   BUN 18 01/23/2024   CO2 24 01/23/2024   TSH 2.29 03/10/2023   HGBA1C 5.2 03/06/2020     Assessment & Plan:    See Problem List for Assessment and Plan of chronic medical problems.

## 2024-03-10 NOTE — Patient Instructions (Addendum)
   Try to monitor your you BP.    Medications changes include :   None     Return in about 6 months (around 09/11/2024) for follow up.

## 2024-03-11 ENCOUNTER — Ambulatory Visit (INDEPENDENT_AMBULATORY_CARE_PROVIDER_SITE_OTHER): Payer: Medicare Other | Admitting: Internal Medicine

## 2024-03-11 VITALS — BP 138/72 | HR 63 | Temp 98.2°F | Ht 64.0 in

## 2024-03-11 DIAGNOSIS — I10 Essential (primary) hypertension: Secondary | ICD-10-CM

## 2024-03-11 DIAGNOSIS — G479 Sleep disorder, unspecified: Secondary | ICD-10-CM

## 2024-03-11 DIAGNOSIS — M48062 Spinal stenosis, lumbar region with neurogenic claudication: Secondary | ICD-10-CM

## 2024-03-11 DIAGNOSIS — G43909 Migraine, unspecified, not intractable, without status migrainosus: Secondary | ICD-10-CM | POA: Diagnosis not present

## 2024-03-11 DIAGNOSIS — E559 Vitamin D deficiency, unspecified: Secondary | ICD-10-CM

## 2024-03-11 DIAGNOSIS — E538 Deficiency of other specified B group vitamins: Secondary | ICD-10-CM

## 2024-03-11 MED ORDER — TRAMADOL HCL 50 MG PO TABS: 50.0000 mg | ORAL_TABLET | Freq: Two times a day (BID) | ORAL | 0 refills | Status: AC | PRN

## 2024-03-11 MED ORDER — TRAZODONE HCL 50 MG PO TABS: ORAL_TABLET | ORAL | 1 refills | Status: DC

## 2024-03-11 NOTE — Assessment & Plan Note (Signed)
Chronic Controlled, Stable Continue trazodone 150 mg at bedtime

## 2024-03-11 NOTE — Assessment & Plan Note (Signed)
Chronic ?Continue vitamin d daily ?

## 2024-03-11 NOTE — Assessment & Plan Note (Addendum)
 Chronic Blood pressure looks okay here today-borderline high, but at home it is variable-sometimes low and sometimes high Unfortunately there is no obvious pattern to make it something easy to fix Continue  amlodipine 2.5 mg bid, losartan 50 mg bid She does not check her blood pressure regularly enough to take a as needed medication Since she may be going up to [facility] as early as this week she does not want to make any changes Encouraged her to monitor her blood pressure and let Dr. Duke Salvia know see if she has any advice for changing medications

## 2024-03-11 NOTE — Assessment & Plan Note (Signed)
Chronic Continue B12 supplementation 

## 2024-03-11 NOTE — Assessment & Plan Note (Signed)
Chronic Rarely has migraines Has Relpax 20 mg that she can take if needed

## 2024-03-11 NOTE — Assessment & Plan Note (Addendum)
 Chronic Intermittent radiculopathy - shooting pain in knee and ankle Continue regular exercise Take Tylenol as needed For more severe pain will take tramadol 50 mg twice daily as needed Pain has worsened and she is taking more tramadol than she used to Would eventually like to see someone about what is can be done, but at this point it is not feasible with her husband's diagnosis of breast cancer and needing to pursue treatment elsewhere possibly

## 2024-03-25 ENCOUNTER — Encounter: Payer: Self-pay | Admitting: Physician Assistant

## 2024-03-25 ENCOUNTER — Ambulatory Visit: Attending: Physician Assistant | Admitting: Physician Assistant

## 2024-03-25 VITALS — BP 112/79 | HR 82 | Ht 64.5 in | Wt 139.0 lb

## 2024-03-25 DIAGNOSIS — M25462 Effusion, left knee: Secondary | ICD-10-CM

## 2024-03-25 DIAGNOSIS — M19071 Primary osteoarthritis, right ankle and foot: Secondary | ICD-10-CM

## 2024-03-25 DIAGNOSIS — M19041 Primary osteoarthritis, right hand: Secondary | ICD-10-CM

## 2024-03-25 DIAGNOSIS — M19042 Primary osteoarthritis, left hand: Secondary | ICD-10-CM | POA: Diagnosis present

## 2024-03-25 DIAGNOSIS — I10 Essential (primary) hypertension: Secondary | ICD-10-CM

## 2024-03-25 DIAGNOSIS — G8929 Other chronic pain: Secondary | ICD-10-CM | POA: Diagnosis present

## 2024-03-25 DIAGNOSIS — E538 Deficiency of other specified B group vitamins: Secondary | ICD-10-CM | POA: Diagnosis present

## 2024-03-25 DIAGNOSIS — E559 Vitamin D deficiency, unspecified: Secondary | ICD-10-CM | POA: Diagnosis present

## 2024-03-25 DIAGNOSIS — M1611 Unilateral primary osteoarthritis, right hip: Secondary | ICD-10-CM

## 2024-03-25 DIAGNOSIS — Z853 Personal history of malignant neoplasm of breast: Secondary | ICD-10-CM

## 2024-03-25 DIAGNOSIS — M51369 Other intervertebral disc degeneration, lumbar region without mention of lumbar back pain or lower extremity pain: Secondary | ICD-10-CM | POA: Diagnosis present

## 2024-03-25 DIAGNOSIS — M359 Systemic involvement of connective tissue, unspecified: Secondary | ICD-10-CM

## 2024-03-25 DIAGNOSIS — R5383 Other fatigue: Secondary | ICD-10-CM | POA: Diagnosis present

## 2024-03-25 DIAGNOSIS — M19072 Primary osteoarthritis, left ankle and foot: Secondary | ICD-10-CM

## 2024-03-25 DIAGNOSIS — M5030 Other cervical disc degeneration, unspecified cervical region: Secondary | ICD-10-CM

## 2024-03-25 DIAGNOSIS — M797 Fibromyalgia: Secondary | ICD-10-CM

## 2024-03-25 DIAGNOSIS — S322XXD Fracture of coccyx, subsequent encounter for fracture with routine healing: Secondary | ICD-10-CM | POA: Diagnosis present

## 2024-03-25 DIAGNOSIS — Z79899 Other long term (current) drug therapy: Secondary | ICD-10-CM | POA: Diagnosis not present

## 2024-03-25 DIAGNOSIS — Z8669 Personal history of other diseases of the nervous system and sense organs: Secondary | ICD-10-CM | POA: Diagnosis present

## 2024-03-25 DIAGNOSIS — M810 Age-related osteoporosis without current pathological fracture: Secondary | ICD-10-CM

## 2024-03-25 DIAGNOSIS — Z8619 Personal history of other infectious and parasitic diseases: Secondary | ICD-10-CM | POA: Diagnosis present

## 2024-03-25 DIAGNOSIS — M65331 Trigger finger, right middle finger: Secondary | ICD-10-CM

## 2024-03-25 DIAGNOSIS — M25511 Pain in right shoulder: Secondary | ICD-10-CM

## 2024-03-25 MED ORDER — HYDROXYCHLOROQUINE SULFATE 200 MG PO TABS: ORAL_TABLET | ORAL | 0 refills | Status: DC

## 2024-03-25 NOTE — Progress Notes (Signed)
 Office Visit Note  Patient: [name]             Date of Birth: July 26, 1946           MRN: [id number]             PCP: Pincus Sanes, MD Referring: Pincus Sanes, MD Visit Date: 03/25/2024 Occupation: @GUAROCC @  Subjective:  Fatigue   History of Present Illness: [name] is a 78 y.o. female with history of autoimmune disease.  She is taking Plaquenil 200 mg 1 tablet by mouth twice daily Monday to Friday.  She is tolerating Plaquenil without any side effects.  Patient ran out of her prescription for Plaquenil on Friday so she requested a refill to be sent to the pharmacy today.  Patient states for the past 2 to 3 weeks she has been experiencing a flare.  She has been experiencing significant fatigue on a daily basis.  She does not wake up feeling restored.  Patient states that she feels winded and significant fatigue even after showering.  She has been taking trazodone to help her sleep at night which helps her sleep through the night.  She has been having to take Tylenol for pain relief and takes tramadol 25 mg 1 tablet daily as needed for pain relief.  She has been apprehensive to take tramadol more consistently due to the concern for dependency.  She is having significant pain involving multiple joints.  She has had increased discomfort in both hands, both knees, her lower back, and in her rib cage. Patient states that her blood pressure has been labile.  She has been checking her blood pressure and heart rate consistently.  Patient has noticed increased shortness of breath as well.  She has not yet scheduled a follow up visit with cardiology.     Activities of Daily Living:  Patient reports morning stiffness for a few hours.   Patient Reports nocturnal pain.  Difficulty dressing/grooming: Reports Difficulty climbing stairs: Reports Difficulty getting out of chair: Reports Difficulty using hands for taps, buttons, cutlery, and/or writing: Reports  Review of Systems   Constitutional:  Positive for fatigue.  HENT:  Positive for mouth dryness. Negative for mouth sores.   Eyes:  Positive for dryness. Negative for pain.  Respiratory:  Positive for shortness of breath. Negative for cough and wheezing.   Cardiovascular:  Negative for chest pain and palpitations.  Gastrointestinal:  Negative for blood in stool, constipation and diarrhea.  Endocrine: Negative for increased urination.  Genitourinary:  Negative for involuntary urination.  Musculoskeletal:  Positive for joint pain, gait problem, joint pain, joint swelling, myalgias, muscle weakness, morning stiffness, muscle tenderness and myalgias.  Skin:  Positive for color change and hair loss. Negative for rash and sensitivity to sunlight.  Allergic/Immunologic: Negative for susceptible to infections.  Neurological:  Negative for dizziness and headaches.  Hematological:  Negative for swollen glands.  Psychiatric/Behavioral:  Negative for depressed mood and sleep disturbance. The patient is nervous/anxious.     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   Other chest pain 03/10/2020   Shingles 11/19/2019   Pain in both knees 07/09/2019   Right arm pain 02/26/2019   Acute pain of left knee 08/27/2018   Hand arthritis 06/13/2018   Trigger middle finger of right hand 06/13/2018   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   Intermittent palpitations 10/11/2017   Migraine 12/30/2015   Upper airway cough syndrome 10/29/2015    Osteoporosis 06/06/2011   Vitamin D deficiency 03/22/2010   BREAST CANCER, HX OF 03/22/2010   HYPERLIPIDEMIA 09/28/2007   Fibromyalgia 05/24/2007    Past Medical History:  Diagnosis Date   Arthritis    Breast cancer (HCC)    R lumpectomy ; radiation; no chemotherapy   Bronchitis    recurrent   Bulging lumbar disc    Cataract    multifocal lens implant   Chronic low back pain    bulging disc & spinal stenosis   Coccygeal fracture (HCC) 05/15/2021   COVID-19    COVID-19 02/2023   Cystitis    Fibromyalgia    Hyperlipemia    Mitral valve prolapse    Osteoporosis    Personal history of radiation therapy    Pneumonia    as OP X 2; no Pneumovax   Ruptured disc, thoracic    Stage 3 chronic kidney disease (HCC)    Vitamin D deficiency    19 in  01/2009    Family History  Problem Relation Age of Onset   Atrial fibrillation Mother        S/P cardioversion   Ulcers Mother    Hypertension Mother    Dementia Mother    Heart attack Father 5       died @ 73   Hypertension Brother    Diabetes Brother    Atrial fibrillation Brother    Heart attack Paternal Aunt        < 65   Heart attack Paternal Uncle        > 55   Heart attack Paternal Grandmother        < 65   Heart attack Paternal Grandfather        > 55   Thyroid disease Daughter        on Synthroid since high school   Healthy Son    Stroke Neg Hx    Breast cancer Neg Hx    Past Surgical History:  Procedure Laterality Date   ABDOMINAL HYSTERECTOMY  1990   USO for fibroid , Dr Laureen Ochs   BREAST LUMPECTOMY Right    BREAST SURGERY  2010   R side lumpectomy; Dr Jamey Ripa   CATARACT EXTRACTION, BILATERAL  2017   CESAREAN SECTION      G 2 P 2   CYSTECTOMY  2010   EYE SURGERY     eye lid surgery   FOOT SURGERY Right    no colonoscopy     "I just don't want to do it". SOC reviewed   Social History   Social History Narrative   Lives with husband   Immunization History  Administered Date(s) Administered    Fluad Quad(high Dose 65+) 10/02/2019, 09/09/2022   Fluad Trivalent(High Dose 65+) 09/11/2023   PFIZER(Purple Top)SARS-COV-2 Vaccination 01/08/2020, 01/27/2020, 10/17/2020   Td 12/20/1999     Objective: Vital Signs: BP 112/79 (BP Location: Left Arm, Patient Position: Sitting, Cuff Size: Normal)   Pulse 82   Ht 5' 4.5" (1.638 m)   Wt 139 lb (63  kg) Comment: per patient  SpO2 99%   BMI 23.49 kg/m    Physical Exam Vitals and nursing note reviewed.  Constitutional:      Appearance: She is well-developed.  HENT:     Head: Normocephalic and atraumatic.  Eyes:     Conjunctiva/sclera: Conjunctivae normal.  Cardiovascular:     Rate and Rhythm: Normal rate and regular rhythm.     Heart sounds: Normal heart sounds.  Pulmonary:     Effort: Pulmonary effort is normal.     Breath sounds: Normal breath sounds.  Abdominal:     General: Bowel sounds are normal.     Palpations: Abdomen is soft.  Musculoskeletal:     Cervical back: Normal range of motion.  Lymphadenopathy:     Cervical: No cervical adenopathy.  Skin:    General: Skin is warm and dry.     Capillary Refill: Capillary refill takes less than 2 seconds.  Neurological:     Mental Status: She is alert and oriented to person, place, and time.  Psychiatric:        Behavior: Behavior normal.      Musculoskeletal Exam: Generalized hyperalgesia and positive tender points on exam.  C-spine has limited range of motion with lateral Tatian.  Trapezius muscle tension tenderness bilaterally.  Shoulder joints, elbow joints, wrist joints, MCPs, PIPs, DIPs have good range of motion with no synovitis.  Tenderness and synovial thickening of the right second MCP joint noted.  Thickening of the left second MCP P joint noted.  CMC, PIP, DIP thickening consistent with osteoarthritis of both hands.  Right hip has slightly limited ROM.  Left hip has good ROM.  Knee joints have good ROM with no warmth or effusion.  Ankle joints have good ROM with no  tenderness or joint swelling. Pitting edema noted in bilateral LE.  CDAI Exam: CDAI Score: -- Patient Global: --; Provider Global: -- Swollen: --; Tender: -- Joint Exam 03/25/2024   No joint exam has been documented for this visit   There is currently no information documented on the homunculus. Go to the Rheumatology activity and complete the homunculus joint exam.  Investigation: No additional findings.  Imaging: No results found.  Recent Labs: Lab Results  Component Value Date   WBC 4.5 01/23/2024   HGB 11.9 01/23/2024   PLT 287 01/23/2024   NA 139 01/23/2024   K 4.7 01/23/2024   CL 106 01/23/2024   CO2 24 01/23/2024   GLUCOSE 91 01/23/2024   BUN 18 01/23/2024   CREATININE 1.07 (H) 01/23/2024   BILITOT 0.3 01/23/2024   ALKPHOS 65 03/10/2023   AST 18 01/23/2024   ALT 16 01/23/2024   PROT 7.0 01/23/2024   ALBUMIN 4.7 03/10/2023   CALCIUM 10.2 01/23/2024   GFRAA 58 (L) 05/24/2021   QFTBGOLDPLUS NEGATIVE 10/27/2021    Speciality Comments: PLQ Eye Exam: 11/30/2023 WNL @ [facility] Follow up in 6 months.  Procedures:  No procedures performed Allergies: Morphine, Albuterol, Augmentin [amoxicillin-pot clavulanate], Ciprofloxacin, Fish allergy, Gabapentin, Risedronate sodium, Cyclobenzaprine, and Ibandronate sodium   Assessment / Plan:     Visit Diagnoses: Autoimmune disease (HCC) - Positive ANA, dry eyes, hair loss, Raynaud's, inflammatory arthritis: Patient presents today experiencing increased fatigue, arthralgias, joint stiffness.  She has been taking Plaquenil 200 mg 1 tablet by mouth twice daily Monday through Friday without interruption.  Patient did request a refill of Plaquenil to be sent to the pharmacy today since she ran out of her prescription on Friday.  For the past 2 to 3 weeks she has been experiencing an increased severity of symptoms.  She has been under increased stress since she will be going to the [facility] with her husband for 5 weeks  for him to undergo radiation therapy.  She is experiencing increased generalized hyperalgesia and positive tender points consistent with a fibromyalgia flare.  Patient has a prescription for tramadol which she has been taking very sparingly--25 mg daily as needed for pain relief.  Patient plans on taking tramadol as prescribed by Dr. Lawerance Bach to try to alleviate her current symptoms.   She has been sleeping well at night taking trazodone but does not wake up feeling restored in the mornings.  She has been experiencing significant fatigue throughout the day as well as shortness of breath and fluctuations in her heart rate and blood pressure.  Patient was advised to call her cardiologist today to schedule a follow-up visit for further evaluation. Lab work from 01/23/24 was reviewed today in the office: ESR WNL, complements WNL, dsDNA negative, and no proteinuria.  Labs were not consistent with a flare at that time.  Offered to recheck inflammatory markers today but she would like to hold off at this time. Discouraged the use of a prednisone taper at this time since her blood pressure and heart rate have been labile.  She plans on taking tramadol 50 mg 1 tablet twice daily as needed for pain relief.  She will also be following up with her cardiologist for further evaluation.  She will notify us if her symptoms persist or worsen.  Discussed the importance of regular exercise, good sleep hygiene, and stress management. She will remain on Plaquenil as prescribed.  She is advised to notify us if her symptoms persist or worsen.  High risk medication use - Plaquenil 200 mg 1 tablet by mouth twice daily Monday to Friday. Intolerance of imuran-nausea and anemia CBC and CMP updated on 01/23/24.   PLQ Eye Exam: 11/30/2023 WNL @ [facility]   Chronic right shoulder pain: Not currently symptomatic.   Primary osteoarthritis of both hands: CMC, PIP, DIP thickening consistent with osteoarthritis of both hands.  She  has been experiencing tenderness and has ongoing thickening in the right second MCP joint.  Trigger middle finger of right hand: Not currently symptomatic.    Primary osteoarthritis of right hip: Slightly limited ROM.   Effusion, left knee: Chronic pain.  Difficulty walking for exercise due to the severity of pain.   Primary osteoarthritis of both feet: Edema noted in LE.  No synovitis noted.     DDD (degenerative disc disease), cervical - X-rays from March 29, 2023 showed degenerative changes and posterior osteophytes at C6- C7.  Evaluated by Dr. Yevette Edwards in the past. Chronic pain   Degeneration of intervertebral disc of lumbar region without discogenic back pain or lower extremity pain: Chronic pain   Age-related osteoporosis without current pathological fracture - DEXA updated on 09/12/2023: LFN T-score -2.4, RFN T-score -2.3, lumbarT-score -1.6.   Previous DEXA 04/23/21: LFN T score -2.7.   Intolerance to Fosamax and Boniva  Vitamin D deficiency: She is taking vitamin D 1000 units daily.   Closed fracture of coccyx with routine healing, subsequent encounter  Fibromyalgia -Patient presents today with hyper analgesia and positive tender points on exam.  She is currently experiencing a fibromyalgia flare likely related to being under increased stress currently.  Patient's been experiencing significant fatigue on a daily basis for the past 2 to 3  weeks.  She has been sleeping well at night with the use of trazodone but does not wake up feeling restored.  She is also been experiencing episodic tachycardia, shortness of breath, and her blood pressure has been labile.  She has not yet scheduled a follow-up visit with cardiology but will do so today.   She has been taking tramadol very sparingly for pain relief as well as Tylenol as needed.  She plans on increasing the use of tramadol to taking it as prescribed 50 mg 1 tablet every 12 hours as needed for pain relief.   Discussed the importance of  regular exercise, good sleep hygiene, and stress management.  Discussed that she would benefit from water aerobics or water exercise.  Other fatigue: Patient is experiencing increased fatigue for the past 2 to 3 weeks.  She has been sleeping well at night.  Patient was advised to schedule an appointment with her cardiologist and she was breathless during examination today.  She has been experiencing episodic tachycardia as well as uncontrolled blood pressure.  Other medical conditions are listed as follows:   Primary hypertension: Her blood pressure has been labile recently.  She is also been experiencing episodic tachycardia and breathlessness.  Patient was advised to call her cardiologist today for further evaluation.  History of breast cancer  Vitamin B12 deficiency  Hx of migraines  History of shingles  Orders: No orders of the defined types were placed in this encounter.  Meds ordered this encounter  Medications   hydroxychloroquine (PLAQUENIL) 200 MG tablet    Sig: TAKE 1 TABLET BY MOUTH TWICE DAILY, MONDAY THROUGH FRIDAY    Dispense:  120 tablet    Refill:  0     Follow-Up Instructions: Return for Autoimmune Disease.   Gearldine Bienenstock, PA-C  Note - This record has been created using Dragon software.  Chart creation errors have been sought, but may not always  have been located. Such creation errors do not reflect on  the standard of medical care.

## 2024-05-27 ENCOUNTER — Ambulatory Visit: Payer: Self-pay | Admitting: *Deleted

## 2024-05-27 ENCOUNTER — Encounter: Payer: Self-pay | Admitting: Internal Medicine

## 2024-05-27 ENCOUNTER — Encounter: Payer: Self-pay | Admitting: Rheumatology

## 2024-05-27 NOTE — Telephone Encounter (Signed)
   FYI Only or Action Required?: Action required by provider  Patient was last seen in primary care on 03/11/2024 by Colene Dauphin, MD. Called Nurse Triage reporting Leg Swelling. Symptoms began several weeks ago. Interventions attempted: OTC medications: na . Symptoms are: gradually worsening.  Triage Disposition: See Physician Within 24 Hours  Patient/caregiver understands and will follow disposition?: yes                     Copied from CRM [id number]. Topic: Clinical - Red Word Triage >> May 27, 2024 10:26 AM Carlatta H wrote: [facility] that prompted transfer to Nurse Triage: Patient has been having pain in both feet and legs for 2 weeks or more//Limited mobility// Reason for Disposition  [1] MODERATE leg swelling (e.g., swelling extends up to knees) AND [2] new-onset or worsening  Answer Assessment - Initial Assessment Questions 1. ONSET: "When did the swelling start?" (e.g., minutes, hours, days)     2 weeks  2. LOCATION: "What part of the leg is swollen?"  "Are both legs swollen or just one leg?"     From ankle to knee  3. SEVERITY: "How bad is the swelling?" (e.g., localized; mild, moderate, severe)   - Localized: Small area of swelling localized to one leg.   - MILD pedal edema: Swelling limited to foot and ankle, pitting edema < 1/4 inch (6 mm) deep, rest and elevation eliminate most or all swelling.   - MODERATE edema: Swelling of lower leg to knee, pitting edema > 1/4 inch (6 mm) deep, rest and elevation only partially reduce swelling.   - SEVERE edema: Swelling extends above knee, facial or hand swelling present.      Moderate "high side" 4. REDNESS: "Does the swelling look red or infected?"     no 5. PAIN: "Is the swelling painful to touch?" If Yes, ask: "How painful is it?"   (Scale 1-10; mild, moderate or severe)     Yes burning sensation back of heel 6. FEVER: "Do you have a fever?" If Yes, ask: "What is it, how was it measured, and when did it start?"       na 7. CAUSE: "What do you think is causing the leg swelling?"     Not sure  8. MEDICAL HISTORY: "Do you have a history of blood clots (e.g., DVT), cancer, heart failure, kidney disease, or liver failure?"     See hx  9. RECURRENT SYMPTOM: "Have you had leg swelling before?" If Yes, ask: "When was the last time?" "What happened that time?"     na 10. OTHER SYMPTOMS: "Do you have any other symptoms?" (e.g., chest pain, difficulty breathing)       limited mobility  back of heel burning sensation , swelling feet to ankles up to calf  11. PREGNANCY: "Is there any chance you are pregnant?" "When was your last menstrual period?"       Na  Patient scheduled appt 05/28/24. Please advise if appt available today  Protocols used: Leg Swelling and Edema-A-AH

## 2024-05-27 NOTE — Progress Notes (Unsigned)
    Subjective:    Patient ID: [name], female    DOB: 1946-10-20, 78 y.o.   MRN: [id number]      HPI [name] is here for No chief complaint on file.   Leg swelling, heel pain - burning sensation -      Medications and allergies reviewed with patient and updated if appropriate.  Current Outpatient Medications on File Prior to Visit  Medication Sig Dispense Refill   acetaminophen (TYLENOL) 500 MG tablet Take 1,000 mg by mouth daily as needed for mild pain.     amLODipine  (NORVASC ) 5 MG tablet Take 1 tablet (5 mg total) by mouth daily. 90 tablet 1   CALCIUM  PO Take by mouth daily.     cholecalciferol (VITAMIN D3) 25 MCG (1000 UNIT) tablet Take 1,000 Units by mouth daily.     CRANBERRY PO Take by mouth.     Cyanocobalamin  (B-12 PO) Take by mouth.     eletriptan  (RELPAX ) 20 MG tablet TAKE 1 TABLET BY MOUTH EVERY 2 HOURS AS NEEDED FOR HEADACHE OR MIGRAINE. MAY REPEAT IN 2 HOURS IF HEADACHE PERSISTS OR RECURS. 9 tablet 1   hydroxychloroquine  (PLAQUENIL ) 200 MG tablet TAKE 1 TABLET BY MOUTH TWICE DAILY, MONDAY THROUGH FRIDAY 120 tablet 0   losartan  (COZAAR ) 50 MG tablet Take 1 tablet (50 mg total) by mouth 2 (two) times daily. 180 tablet 1   Probiotic Product (PROBIOTIC DAILY PO) Take by mouth.     RESTASIS 0.05 % ophthalmic emulsion SMARTSIG:1 Drop(s) In Eye(s) Every 12 Hours     traMADol  (ULTRAM ) 50 MG tablet Take 1 tablet (50 mg total) by mouth every 12 (twelve) hours as needed. 60 tablet 0   traZODone  (DESYREL ) 50 MG tablet TAKE 3 TABLETS(150 MG) BY MOUTH AT BEDTIME 270 tablet 1   No current facility-administered medications on file prior to visit.    Review of Systems     Objective:  There were no vitals filed for this visit. BP Readings from Last 3 Encounters:  03/25/24 112/79  03/11/24 138/72  01/23/24 (!) 147/84   Wt Readings from Last 3 Encounters:  03/25/24 139 lb (63 kg)  01/23/24 137 lb (62.1 kg)  12/22/23 137 lb (62.1 kg)   There is no height or  weight on file to calculate BMI.    Physical Exam         Assessment & Plan:    See Problem List for Assessment and Plan of chronic medical problems.

## 2024-05-28 ENCOUNTER — Encounter: Payer: Self-pay | Admitting: Internal Medicine

## 2024-05-28 ENCOUNTER — Ambulatory Visit (INDEPENDENT_AMBULATORY_CARE_PROVIDER_SITE_OTHER): Admitting: Internal Medicine

## 2024-05-28 VITALS — BP 114/70 | HR 79 | Temp 98.0°F | Ht 64.5 in

## 2024-05-28 DIAGNOSIS — M79671 Pain in right foot: Secondary | ICD-10-CM

## 2024-05-28 DIAGNOSIS — M79672 Pain in left foot: Secondary | ICD-10-CM | POA: Insufficient documentation

## 2024-05-28 NOTE — Progress Notes (Deleted)
 Office Visit Note  Patient: [name]             Date of Birth: 1946/05/18           MRN: [id number]             PCP: Colene Dauphin, MD Referring: Colene Dauphin, MD Visit Date: 06/05/2024 Occupation: @GUAROCC @  Subjective:    History of Present Illness: [name] is a 78 y.o. female with history of autoimmune disease and osteoarthritis.  Patient remains on Plaquenil  200 mg 1 tablet by mouth twice daily Monday to Friday.   PLQ Eye Exam: 05/30/2024 WNL @ [facility] Follow up in 6 months.  CBC and CMP updated on 01/23/24.   Activities of Daily Living:  Patient reports morning stiffness for *** {minute/hour:19697}.   Patient {ACTIONS;DENIES/REPORTS:21021675::Denies} nocturnal pain.  Difficulty dressing/grooming: {ACTIONS;DENIES/REPORTS:21021675::Denies} Difficulty climbing stairs: {ACTIONS;DENIES/REPORTS:21021675::Denies} Difficulty getting out of chair: {ACTIONS;DENIES/REPORTS:21021675::Denies} Difficulty using hands for taps, buttons, cutlery, and/or writing: {ACTIONS;DENIES/REPORTS:21021675::Denies}  No Rheumatology ROS completed.   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/05/2020   Other chest pain 03/10/2020   Shingles 11/19/2019   Pain in both knees 07/09/2019   Right arm pain 02/26/2019   Acute pain of left knee 08/27/2018   Hand arthritis 06/13/2018   Trigger middle finger of right hand 06/13/2018   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    Intermittent palpitations 10/11/2017   Migraine 12/30/2015   Upper airway cough syndrome 10/29/2015   Osteoporosis 06/06/2011   Vitamin D  deficiency 03/22/2010   BREAST CANCER, HX OF 03/22/2010   HYPERLIPIDEMIA 09/28/2007   Fibromyalgia 05/24/2007    Past Medical History:  Diagnosis Date   Arthritis    Breast cancer (HCC)    R lumpectomy ; radiation; no chemotherapy   Bronchitis    recurrent   Bulging lumbar disc    Cataract    multifocal lens implant   Chronic low back pain    bulging disc & spinal stenosis   Coccygeal fracture (HCC) 05/15/2021   COVID-19    COVID-19 02/2023   Cystitis    Fibromyalgia    Hyperlipemia    Mitral valve prolapse    Osteoporosis    Personal history of radiation therapy    Pneumonia    as OP X 2; no Pneumovax   Ruptured disc, thoracic    Stage 3 chronic kidney disease (HCC)    Vitamin D  deficiency    19 in  01/2009    Family History  Problem Relation Age of Onset   Atrial fibrillation Mother        S/P cardioversion   Ulcers Mother    Hypertension Mother    Dementia Mother    Heart attack Father 39       died @ 109   Hypertension Brother    Diabetes Brother    Atrial fibrillation Brother    Heart attack Paternal Aunt        < 65   Heart attack Paternal Uncle        >  55   Heart attack Paternal Grandmother        < 65   Heart attack Paternal Grandfather        > 55   Thyroid  disease Daughter        on Synthroid since high school   Healthy Son    Stroke Neg Hx    Breast cancer Neg Hx    Past Surgical History:  Procedure Laterality Date   ABDOMINAL HYSTERECTOMY  1990   [facility] for fibroid , Dr Arvil Birks   BREAST LUMPECTOMY Right    BREAST SURGERY  2010   R side lumpectomy; Dr Linell Rhymes   CATARACT EXTRACTION, BILATERAL  2017   CESAREAN SECTION      G 2 P 2   CYSTECTOMY  2010   EYE SURGERY     eye lid surgery   FOOT SURGERY Right    no colonoscopy     I just don't want to do it. SOC reviewed   Social History   Social  History Narrative   Lives with husband   Immunization History  Administered Date(s) Administered   Fluad Quad(high Dose 65+) 10/02/2019, 09/09/2022   Fluad Trivalent(High Dose 65+) 09/11/2023   PFIZER(Purple Top)SARS-COV-2 Vaccination 01/08/2020, 01/27/2020, 10/17/2020   Td 12/20/1999     Objective: Vital Signs: There were no vitals taken for this visit.   Physical Exam Vitals and nursing note reviewed.  Constitutional:      Appearance: She is well-developed.  HENT:     Head: Normocephalic and atraumatic.   Eyes:     Conjunctiva/sclera: Conjunctivae normal.    Cardiovascular:     Rate and Rhythm: Normal rate and regular rhythm.     Heart sounds: Normal heart sounds.  Pulmonary:     Effort: Pulmonary effort is normal.     Breath sounds: Normal breath sounds.  Abdominal:     General: Bowel sounds are normal.     Palpations: Abdomen is soft.   Musculoskeletal:     Cervical back: Normal range of motion.  Lymphadenopathy:     Cervical: No cervical adenopathy.   Skin:    General: Skin is warm and dry.     Capillary Refill: Capillary refill takes less than 2 seconds.   Neurological:     Mental Status: She is alert and oriented to person, place, and time.   Psychiatric:        Behavior: Behavior normal.      Musculoskeletal Exam: ***  CDAI Exam: CDAI Score: -- Patient Global: --; Provider Global: -- Swollen: --; Tender: -- Joint Exam 06/05/2024   No joint exam has been documented for this visit   There is currently no information documented on the homunculus. Go to the Rheumatology activity and complete the homunculus joint exam.  Investigation: No additional findings.  Imaging: No results found.  Recent Labs: Lab Results  Component Value Date   WBC 4.5 01/23/2024   HGB 11.9 01/23/2024   PLT 287 01/23/2024   NA 139 01/23/2024   K 4.7 01/23/2024   CL 106 01/23/2024   CO2 24 01/23/2024   GLUCOSE 91 01/23/2024   BUN 18 01/23/2024   CREATININE  1.07 (H) 01/23/2024   BILITOT 0.3 01/23/2024   ALKPHOS 65 03/10/2023   AST 18 01/23/2024   ALT 16 01/23/2024   PROT 7.0 01/23/2024   ALBUMIN 4.7 03/10/2023   CALCIUM  10.2 01/23/2024   GFRAA 58 (L) 05/24/2021   QFTBGOLDPLUS NEGATIVE 10/27/2021    Speciality Comments: PLQ Eye  Exam: 11/30/2023 WNL @ [facility] Follow up in 6 months.  Procedures:  No procedures performed Allergies: Morphine, Albuterol, Augmentin [amoxicillin-pot clavulanate], Ciprofloxacin, Fish allergy, Gabapentin , Risedronate sodium, Cyclobenzaprine , and Ibandronate sodium   Assessment / Plan:     Visit Diagnoses: Autoimmune disease (HCC)  High risk medication use  Chronic right shoulder pain  Primary osteoarthritis of both hands  Trigger middle finger of right hand  Primary osteoarthritis of right hip  Effusion, left knee  Primary osteoarthritis of both feet  DDD (degenerative disc disease), cervical  Degeneration of intervertebral disc of lumbar region without discogenic back pain or lower extremity pain  Age-related osteoporosis without current pathological fracture  Vitamin D  deficiency  Closed fracture of coccyx with routine healing, subsequent encounter  Fibromyalgia  Other fatigue  Primary hypertension  History of breast cancer  Vitamin B12 deficiency  Hx of migraines  History of shingles  Orders: No orders of the defined types were placed in this encounter.  No orders of the defined types were placed in this encounter.   Face-to-face time spent with patient was *** minutes. Greater than 50% of time was spent in counseling and coordination of care.  Follow-Up Instructions: No follow-ups on file.   Romayne Clubs, PA-C  Note - This record has been created using Dragon software.  Chart creation errors have been sought, but may not always  have been located. Such creation errors do not reflect on  the standard of medical care.

## 2024-05-28 NOTE — Patient Instructions (Addendum)
       Medications changes include :   None    A referral was ordered for sports medicine and someone will call you to schedule an appointment.

## 2024-05-28 NOTE — Assessment & Plan Note (Signed)
 Acute Left heel  >> right heel Pain in distal Achilles-possible Achilles tendinitis-?  Bilateral Has burning sensation radiating up from there halfway up the leg-left leg much worse than right leg ?  Element of pain from her back Advised icing her Achilles to see if that helps Take the tramadol  50-100 mg tonight Deferred steroids at this time Will see sports medicine tomorrow to get their opinion

## 2024-05-29 ENCOUNTER — Ambulatory Visit (INDEPENDENT_AMBULATORY_CARE_PROVIDER_SITE_OTHER)

## 2024-05-29 ENCOUNTER — Ambulatory Visit (INDEPENDENT_AMBULATORY_CARE_PROVIDER_SITE_OTHER): Admitting: Sports Medicine

## 2024-05-29 VITALS — BP 130/70 | HR 74 | Ht 64.5 in

## 2024-05-29 DIAGNOSIS — G8929 Other chronic pain: Secondary | ICD-10-CM

## 2024-05-29 DIAGNOSIS — M5442 Lumbago with sciatica, left side: Secondary | ICD-10-CM | POA: Diagnosis not present

## 2024-05-29 DIAGNOSIS — M25572 Pain in left ankle and joints of left foot: Secondary | ICD-10-CM

## 2024-05-29 DIAGNOSIS — M7751 Other enthesopathy of right foot: Secondary | ICD-10-CM

## 2024-05-29 DIAGNOSIS — M25571 Pain in right ankle and joints of right foot: Secondary | ICD-10-CM

## 2024-05-29 DIAGNOSIS — M79672 Pain in left foot: Secondary | ICD-10-CM

## 2024-05-29 DIAGNOSIS — M79671 Pain in right foot: Secondary | ICD-10-CM

## 2024-05-29 DIAGNOSIS — M5441 Lumbago with sciatica, right side: Secondary | ICD-10-CM

## 2024-05-29 DIAGNOSIS — M778 Other enthesopathies, not elsewhere classified: Secondary | ICD-10-CM

## 2024-05-29 HISTORY — DX: Other enthesopathy of right foot and ankle: M77.51

## 2024-05-29 HISTORY — DX: Other enthesopathies, not elsewhere classified: M77.8

## 2024-05-29 MED ORDER — METHYLPREDNISOLONE 4 MG PO TBPK: ORAL_TABLET | ORAL | 0 refills | Status: DC

## 2024-05-29 NOTE — Progress Notes (Signed)
 Ben Ben Sanz D.[facility] [address] [place] [address] Phone: [phone]   Assessment and Plan:     1. Bilateral foot pain (Primary) 2. Chronic pain of both ankles 3. Chronic bilateral low back pain with bilateral sciatica - Chronic with exacerbation, initial sports medicine visit - Patient experiencing worsening bilateral lower extremity pain and hypersensitivity from mid shin and distal without specific MOI.  Patient has noticed intermittent swelling in bilateral lower extremities, and pain that worsens with prolonged activity.  I feel most likely cause of patient's current symptoms is lumbar radiculopathy with degenerative changes seen most pronounced at L4-L5 and L5-S1 from lumbar MRI March 2023. - Patient is hopeful that pain is coming primarily from her feet and ankles, and would like to start a conservative treatment plan targeting these areas to see if symptoms improve.  Agreed that we could try to   directly treat foot and ankle pain, however if no significant improvement in 3 to 4 weeks, would further evaluate lumbar etiology of symptoms - Start HEP and physical therapy for low back, feet, ankles - Start prednisone  Dosepak -Patient prefers to not use NSAIDs with past medical history of hypertension and CKD stage IIIa  Pertinent previous records reviewed include lumbar MRI 03/05/2022  Follow Up: 3 to 4 weeks for reevaluation or sooner if no improvement or worsening of symptoms.  Would consider epidural CSI   Subjective:   I, Leone Ralphs am a scribe for Dr. Cleora Daft.    Chief Complaint: bilat foot pain   HPI:   05/29/24 Patient is a 78 year old female with bilat foot pain. Patient states it burns from the feet to the knee. Heel is so sore that she can not stand anything on it not even shoes. When she walks her feet want to turn out instead of staying straight. Pain does not wake her up at night. Has MRI of her back with her.  Dr. Donnette Gal thinks that this pain is coming from her back.  Duration?over a month Did you have an Injury to cause this pain?no Taking Medication for pain? Extra strength tylenol, tramadol  Numbness or Tingling?no Does the pain Radiate? no Altered gait or use?yes ROM/ impairment of movement?yes   Relevant Historical Information:   CKD stage IIIa, hypertension  Additional pertinent review of systems negative.   Current Outpatient Medications:    acetaminophen (TYLENOL) 500 MG tablet, Take 1,000 mg by mouth daily as needed for mild pain., Disp: , Rfl:    amLODipine  (NORVASC ) 5 MG tablet, Take 1 tablet (5 mg total) by mouth daily., Disp: 90 tablet, Rfl: 1   CALCIUM  PO, Take by mouth daily., Disp: , Rfl:    cholecalciferol (VITAMIN D3) 25 MCG (1000 UNIT) tablet, Take 1,000 Units by mouth daily., Disp: , Rfl:    CRANBERRY PO, Take by mouth., Disp: , Rfl:    Cyanocobalamin  (B-12 PO), Take by mouth., Disp: , Rfl:    eletriptan  (RELPAX ) 20 MG tablet, TAKE 1 TABLET BY MOUTH EVERY 2 HOURS AS NEEDED FOR HEADACHE OR MIGRAINE. MAY REPEAT IN 2 HOURS IF HEADACHE PERSISTS OR RECURS., Disp: 9 tablet, Rfl: 1   hydroxychloroquine  (PLAQUENIL ) 200 MG tablet, TAKE 1 TABLET BY MOUTH TWICE DAILY, MONDAY THROUGH FRIDAY, Disp: 120 tablet, Rfl: 0   methylPREDNISolone (MEDROL DOSEPAK) 4 MG TBPK tablet, Follow instructions on package., Disp: 21 tablet, Rfl: 0   Probiotic Product (PROBIOTIC DAILY PO), Take by mouth., Disp: , Rfl:    RESTASIS  0.05 % ophthalmic emulsion, SMARTSIG:1 Drop(s) In Eye(s) Every 12 Hours, Disp: , Rfl:    traMADol  (ULTRAM ) 50 MG tablet, Take 1 tablet (50 mg total) by mouth every 12 (twelve) hours as needed., Disp: 60 tablet, Rfl: 0   traZODone  (DESYREL ) 50 MG tablet, TAKE 3 TABLETS(150 MG) BY MOUTH AT BEDTIME, Disp: 270 tablet, Rfl: 1   losartan  (COZAAR ) 50 MG tablet, Take 1 tablet (50 mg total) by mouth 2 (two) times daily., Disp: 180 tablet, Rfl: 1   Objective:     Vitals:   05/29/24 1257   BP: 130/70  Pulse: 74  SpO2: 98%  Height: 5' 4.5 (1.638 m)      Body mass index is 23.49 kg/m.    Physical Exam:    Gen: Appears well, nad, nontoxic and pleasant Psych: Alert and oriented, appropriate mood and affect Neuro: Hypersensitivity to bilateral lateral shins, left anterior shin, bilateral dorsal surface of feet.  Muscle strength 4/5 in left lower extremity compared to 5/5 in right lower extremity Skin: no susupicious lesions or rashes    Back - Normal skin, Spine with normal alignment and no deformity.   No tenderness to vertebral process palpation.   Paraspinous muscles are not tender and without spasm NTTP gluteal musculature Straight leg raise positive bilaterally  Gait antalgic with short steps  Bilateral foot/ankle:  No deformity, Mild medial ankle swelling without ecchymosis, erythema, warmth TTP mild broadly, most intense at posterior calcaneus, medial malleolus Negative ant drawer, talar tilt, rotation test, squeeze test. Neg thompson No pain with resisted inversion or eversion    Electronically signed by:  Marshall Skeeter D.[facility] 1:53 PM 05/29/24

## 2024-05-29 NOTE — Patient Instructions (Addendum)
 Prednisone  dose pack Home exercises for Achillis tendinitis.  Physical therapy referral the [facility]. Follow up in 4 weeks.

## 2024-05-30 ENCOUNTER — Ambulatory Visit: Payer: Self-pay | Admitting: Sports Medicine

## 2024-06-03 NOTE — Progress Notes (Unsigned)
 Office Visit Note  Patient: [name]             Date of Birth: 09-Nov-1946           MRN: [id number]             PCP: Colene Dauphin, MD Referring: Colene Dauphin, MD Visit Date: 06/04/2024 Occupation: @GUAROCC @  Subjective:  Pain in both feet  History of Present Illness: [name] is a 78 y.o. female with history of autoimmune disease and osteoarthritis.  Patient remains on Plaquenil  200 mg 1 tablet by mouth twice daily Monday to Friday.  She is tolerating Plaquenil  without any side effects and has not had any recent gaps in therapy.  Patient presents today experiencing increased pain and inflammation involving both feet since April 2025.  She denies any injury prior to the onset of symptoms but was walking more than usual while at the [facility] with her husband.  She has had difficulty continuing her normal exercise walking regimen due to the severity of symptoms.  The discomfort in her feet is exacerbated by prolonged walking as well as climbing steps.  Most of the discomfort is at the base of her heel.  She was recently evaluated by Dr. Cleora Daft on 05/29/2024.  Patient had updated x-rays of both feet for further evaluation.  According to the patient she was diagnosed with tendinitis but she is also concerned about bursitis.  Patient was prescribed a prednisone  Dosepak but did not initiate the taper due to the concern for possible side effects. She denies any other signs or symptoms of a flare recently.  She has not had any symptoms of Raynaud's phenomenon.  She denies any recent rashes.  She denies any oral or nasal ulcerations.  She continues to have chronic sicca symptoms.      Activities of Daily Living:  Patient reports morning stiffness for 3 hours.   Patient Reports nocturnal pain.  Difficulty dressing/grooming: Reports Difficulty climbing stairs: Reports Difficulty getting out of chair: Reports Difficulty using hands for taps, buttons, cutlery, and/or writing:  Reports  Review of Systems  Constitutional:  Positive for fatigue.  HENT:  Positive for mouth dryness. Negative for mouth sores.   Eyes:  Positive for dryness.  Respiratory:  Negative for shortness of breath.   Cardiovascular:  Negative for chest pain and palpitations.  Gastrointestinal:  Negative for blood in stool, constipation and diarrhea.  Endocrine: Negative for increased urination.  Genitourinary:  Negative for involuntary urination.  Musculoskeletal:  Positive for joint pain, joint pain, joint swelling, myalgias, muscle weakness, morning stiffness, muscle tenderness and myalgias. Negative for gait problem.  Skin:  Positive for hair loss. Negative for color change, rash and sensitivity to sunlight.  Allergic/Immunologic: Negative for susceptible to infections.  Neurological:  Negative for dizziness and headaches.  Hematological:  Negative for swollen glands.  Psychiatric/Behavioral:  Negative for depressed mood and sleep disturbance. The patient is not nervous/anxious.     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   Other chest pain 03/10/2020   Shingles 11/19/2019   Pain in both knees 07/09/2019   Right arm pain 02/26/2019   Acute pain of left knee 08/27/2018   Hand arthritis 06/13/2018   Trigger middle finger of right hand 06/13/2018   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   Intermittent palpitations 10/11/2017   Migraine 12/30/2015   Upper airway  cough syndrome 10/29/2015   Osteoporosis 06/06/2011   Vitamin D  deficiency 03/22/2010   BREAST CANCER, HX OF 03/22/2010   HYPERLIPIDEMIA 09/28/2007   Fibromyalgia 05/24/2007    Past Medical History:  Diagnosis Date   Arthritis    Breast cancer (HCC)    R lumpectomy ; radiation; no chemotherapy   Bronchitis    recurrent   Bulging lumbar disc    Cataract    multifocal lens implant   Chronic low back pain    bulging disc & spinal stenosis   Coccygeal fracture (HCC) 05/15/2021   COVID-19    COVID-19 02/2023   Cystitis    Fibromyalgia    Hyperlipemia    Mitral valve prolapse    Osteoporosis    Personal history of radiation therapy    Pneumonia    as OP X 2; no Pneumovax   Ruptured disc, thoracic    Stage 3 chronic kidney disease (HCC)    Tendinitis of both ankles 05/29/2024   Tendinitis of both feet 05/29/2024   Vitamin D  deficiency    19 in  01/2009    Family History  Problem Relation Age of Onset   Atrial fibrillation Mother        S/P cardioversion   Ulcers Mother    Hypertension Mother    Dementia Mother    Heart attack Father 43       died @ 10   Hypertension Brother    Other Brother        Atrial septum defect   Diabetes Brother    Atrial fibrillation Brother    Peptic Ulcer Brother    Heart attack Paternal Grandmother        < 65   Heart attack Paternal Grandfather        > 55   Thyroid  disease Daughter        on Synthroid since high school   Healthy Son    Heart attack Paternal Aunt        < 65   Heart attack Paternal Uncle        > 55   Stroke Neg Hx    Breast cancer Neg Hx    Past Surgical History:  Procedure Laterality Date   ABDOMINAL HYSTERECTOMY  1990   [facility] for fibroid , Dr Arvil Birks   BREAST LUMPECTOMY Right    BREAST SURGERY  2010   R side lumpectomy; Dr Linell Rhymes   CATARACT EXTRACTION, BILATERAL  2017   CESAREAN SECTION      G 2 P 2   CYSTECTOMY  2010   EYE SURGERY     eye lid surgery   FOOT SURGERY Right    no colonoscopy     I  just don't want to do it. SOC reviewed   Social History   Social History Narrative   Lives with husband   Immunization History  Administered Date(s) Administered   Fluad Quad(high Dose 65+) 10/02/2019, 09/09/2022   Fluad Trivalent(High Dose 65+) 09/11/2023   PFIZER(Purple Top)SARS-COV-2 Vaccination 01/08/2020, 01/27/2020, 10/17/2020   Td 12/20/1999  Objective: Vital Signs: BP 116/77 (BP Location: Left Arm, Patient Position: Sitting)   Pulse 72   Resp 16   Ht 5' 4.5 (1.638 m)   BMI 23.49 kg/m    Physical Exam Vitals and nursing note reviewed.  Constitutional:      Appearance: She is well-developed.  HENT:     Head: Normocephalic and atraumatic.   Eyes:     Conjunctiva/sclera: Conjunctivae normal.    Cardiovascular:     Rate and Rhythm: Normal rate and regular rhythm.     Heart sounds: Normal heart sounds.  Pulmonary:     Effort: Pulmonary effort is normal.     Breath sounds: Normal breath sounds.  Abdominal:     General: Bowel sounds are normal.     Palpations: Abdomen is soft.   Musculoskeletal:     Cervical back: Normal range of motion.  Lymphadenopathy:     Cervical: No cervical adenopathy.   Skin:    General: Skin is warm and dry.     Capillary Refill: Capillary refill takes less than 2 seconds.   Neurological:     Mental Status: She is alert and oriented to person, place, and time.   Psychiatric:        Behavior: Behavior normal.      Musculoskeletal Exam: Generalized hyperalgesia and positive tender points on exam.  C-spine is limited range of motion with lateral rotation.  Trapezius muscle tension and tenderness bilaterally.  Shoulder joints have good range of motion.  Elbow joints, wrist joints, MCPs, PIPs, DIPs have good range of motion with no synovitis.  Prominence and tenderness over the right CMC joint.  PIP and DIP thickening noted.  Thickening over the right second MCP joint noted.  Right hip as slightly limited range of motion.  Left  hip has good range of motion.  Knee joints have good range of motion no warmth or effusion.  Ankle joints have good range of motion with no tenderness along the joint line.  Tenderness at the base of the Achilles tendon over the retrocalcaneal bursa.  Mild tenderness over the plantar fascia of the left foot.  Pitting edema noted in bilateral lower extremities.  Some tenderness of both MTP joints noted.  CDAI Exam: CDAI Score: -- Patient Global: --; Provider Global: -- Swollen: --; Tender: -- Joint Exam 06/04/2024   No joint exam has been documented for this visit   There is currently no information documented on the homunculus. Go to the Rheumatology activity and complete the homunculus joint exam.  Investigation: No additional findings.  Imaging: DG Foot Complete Left Result Date: 05/29/2024 CLINICAL DATA:  Bilateral heel pain for a few months. EXAM: LEFT FOOT - COMPLETE 3+ VIEW COMPARISON:  Left foot radiographs 01/19/2021 FINDINGS: Minimal lateral great toe metatarsophalangeal degenerative spurring without joint space narrowing. Mild 2 moderate chronic enthesopathic change at the Achilles tendon insertion on the calcaneus. Mild posterior dorsal navicular degenerative osteophytosis, similar to prior. No acute fracture or dislocation. IMPRESSION: Mild to moderate chronic enthesopathic change at the Achilles tendon insertion on the calcaneus. Electronically Signed   By: Bertina Broccoli M.D.   On: 05/29/2024 18:23   DG Foot Complete Right Result Date: 05/29/2024 CLINICAL DATA:  Bilateral heel pain for few months. No known injury. EXAM: RIGHT FOOT COMPLETE - 3+ VIEW COMPARISON:  Right foot radiographs 06/28/2022 FINDINGS: Moderate great toe metatarsophalangeal joint space narrowing, greatest medially, and moderate dorsal and mild peripheral lateral greater than medial degenerative osteophytes. A screw overlies the  distal fifth metatarsal, unchanged. There is attenuation of the distal aspect of the  proximal phalanx of the fifth toe, possibly postsurgical. Small plantar and posterior calcaneal heel spurs. No acute fracture or dislocation. IMPRESSION: 1. Moderate great toe metatarsophalangeal joint osteoarthritis. 2. Small plantar and posterior calcaneal heel spurs. Electronically Signed   By: Bertina Broccoli M.D.   On: 05/29/2024 18:21    Recent Labs: Lab Results  Component Value Date   WBC 4.5 01/23/2024   HGB 11.9 01/23/2024   PLT 287 01/23/2024   NA 139 01/23/2024   K 4.7 01/23/2024   CL 106 01/23/2024   CO2 24 01/23/2024   GLUCOSE 91 01/23/2024   BUN 18 01/23/2024   CREATININE 1.07 (H) 01/23/2024   BILITOT 0.3 01/23/2024   ALKPHOS 65 03/10/2023   AST 18 01/23/2024   ALT 16 01/23/2024   PROT 7.0 01/23/2024   ALBUMIN 4.7 03/10/2023   CALCIUM  10.2 01/23/2024   GFRAA 58 (L) 05/24/2021   QFTBGOLDPLUS NEGATIVE 10/27/2021    Speciality Comments: PLQ Eye Exam: 05/30/2024 WNL @ [facility] Follow up in 6 months.  Procedures:  No procedures performed Allergies: Morphine, Albuterol, Augmentin [amoxicillin-pot clavulanate], Ciprofloxacin, Fish allergy, Gabapentin , Risedronate sodium, Cyclobenzaprine , and Ibandronate sodium   Assessment / Plan:     Visit Diagnoses: Autoimmune disease (HCC) - Positive ANA, dry eyes, hair loss, Raynaud's, inflammatory arthritis: Patient presents today experiencing increased fatigue, arthralgias, joint stiffness: She is not exhibiting any signs or symptoms of an autoimmune disease flare.  She is clinically been doing well taking Plaquenil  200 mg 1 tablet by mouth twice daily Monday through Friday.  She is tolerating Plaquenil  without any side effects and has not had any recent gaps in therapy.  She has not had any recent rashes, Raynaud's phenomenon, or oral or nasal ulcerations.  She has chronic sicca symptoms. Lab work from 01/23/2024 was reviewed today in the office: No proteinuria, ESR within normal limits, complements within normal limits, and  double-stranded a negative.  Patient plans on having updated lab work at her upcoming follow-up visit scheduled on 06/27/2024.  Future orders were placed today.  She will remain on Plaquenil  as prescribed. - Plan: Protein / creatinine ratio, urine, CBC with Differential/Platelet, C3 and C4, Sedimentation rate, Comprehensive metabolic panel with GFR, ANA, Anti-DNA antibody, double-stranded  High risk medication use - Plaquenil  200 mg 1 tablet by mouth twice daily Monday to Friday. Intolerance of imuran -nausea and anemia.  PLQ Eye Exam: 05/30/2024 WNL @ [facility] Follow up in 6 months.  CBC and CMP updated on 01/23/24.  She plans on having updated lab work at her upcoming visit in July 2025.  Plan: CBC with Differential/Platelet, Comprehensive metabolic panel with GFR  Chronic right shoulder pain: She has good ROM of the right shoulder on examination today.   Trigger middle finger of right hand: Not currently symptomatic.    Primary osteoarthritis of both hands: CMC, PIP and DIP thickening consistent with osteoarthritis of both hands.  No synovitis noted.   Primary osteoarthritis of right hip: No groin pain currently.   Effusion, left knee: No warmth or effusion noted today.    Primary osteoarthritis of both feet: Patient presents today with increased pain in both feet, left worse than right.  Her symptoms initially started in April when she was having to walk more than usual while at the [facility] with her husband.  Her discomfort has progressively been worsening and is exacerbated by prolonged activity and climbing steps.  Most of her discomfort is at the base of the heel involving both feet, left greater than right.  She experiences pain when putting on tennis shoes due to the pressure at the site.  She experiences some soreness of at the plantar fascia first thing in the morning or after sitting for prolonged periods of time.  On examination she has tenderness at the base of the  Achilles tendon over the retrocalcaneal bursa bilaterally.   She was evaluated by Dr. Cleora Daft on 05/29/24--x-rays of both feet were obtained: left foot-Mild to moderate chronic enthesopathic change at the Achilles tendon insertion on the calcaneus and x-rays of the right foot-Moderate great toe metatarsophalangeal joint osteoarthritis and Small plantar and posterior calcaneal heel spurs noted. She was prescribed a prednisone  Dosepak which she has not yet initiated due to the concern for side effects. Different treatment options were discussed including proper shoewear.  She is considering proceeding with physical therapy in the future but will first try taking the prednisone  Dosepak prescribed by Dr. Cleora Daft.  DDD (degenerative disc disease), cervical - X-rays from March 29, 2023 showed degenerative changes and posterior osteophytes at C6- C7.  Evaluated by Dr. Jackee Marus in the past. Chronic pain  Lumbar spondylosis: Chronic pain.  Evaluated by Dr. Cleora Daft on 05/29/2024 at [facility] sports medicine the pain that she has been experiencing in bilateral lower extremities was felt to be due to lumbar radiculopathy with degenerative changes seen most pronounced at L4-L5 L5-S1 from MRI of the lumbar on March 2023. A prednisone  Dosepak was prescribed but she has not yet initiated.  She is considering proceeding with physical therapy but will be following back up with Dr. Cleora Daft on 06/26/2024.  Age-related osteoporosis without current pathological fracture - DEXA updated on 09/12/2023: LFN T-score -2.4, RFN T-score -2.3, lumbarT-score -1.6.  Previous DEXA 04/23/21: LFN T score -2.7.  Intolerance to Fosamax,Boniva Due to update DEXA in September 2026.   Vitamin D  deficiency: She is taking vitamin D  1000 units daily.  Closed fracture of coccyx with routine healing, subsequent encounter  Fibromyalgia -She has intermittent myalgias and muscle tenderness due to fibromyalgia.  She takes tramadol  sparingly for pain relief  and trazodone  at bedtime for insomnia.   Other fatigue: Chronic, stable.  Other medical conditions are listed as follows:  Primary hypertension: Blood pressure was 116/77 today in the office.  History of shingles  History of breast cancer  Vitamin B12 deficiency  Hx of migraines  Orders: Orders Placed This Encounter  Procedures   Protein / creatinine ratio, urine   CBC with Differential/Platelet   C3 and C4   Sedimentation rate   Comprehensive metabolic panel with GFR   ANA   Anti-DNA antibody, double-stranded   No orders of the defined types were placed in this encounter.    Follow-Up Instructions: Return for Autoimmune Disease, Osteoarthritis.   Romayne Clubs, PA-C  Note - This record has been created using Dragon software.  Chart creation errors have been sought, but may not always  have been located. Such creation errors do not reflect on  the standard of medical care.

## 2024-06-04 ENCOUNTER — Encounter: Payer: Self-pay | Admitting: Physician Assistant

## 2024-06-04 ENCOUNTER — Ambulatory Visit: Attending: Physician Assistant | Admitting: Physician Assistant

## 2024-06-04 VITALS — BP 116/77 | HR 72 | Resp 16 | Ht 64.5 in

## 2024-06-04 DIAGNOSIS — G8929 Other chronic pain: Secondary | ICD-10-CM | POA: Diagnosis present

## 2024-06-04 DIAGNOSIS — Z853 Personal history of malignant neoplasm of breast: Secondary | ICD-10-CM | POA: Insufficient documentation

## 2024-06-04 DIAGNOSIS — Z8619 Personal history of other infectious and parasitic diseases: Secondary | ICD-10-CM | POA: Insufficient documentation

## 2024-06-04 DIAGNOSIS — I10 Essential (primary) hypertension: Secondary | ICD-10-CM | POA: Insufficient documentation

## 2024-06-04 DIAGNOSIS — R5383 Other fatigue: Secondary | ICD-10-CM | POA: Insufficient documentation

## 2024-06-04 DIAGNOSIS — M25511 Pain in right shoulder: Secondary | ICD-10-CM | POA: Diagnosis not present

## 2024-06-04 DIAGNOSIS — M47816 Spondylosis without myelopathy or radiculopathy, lumbar region: Secondary | ICD-10-CM | POA: Diagnosis present

## 2024-06-04 DIAGNOSIS — M5030 Other cervical disc degeneration, unspecified cervical region: Secondary | ICD-10-CM | POA: Insufficient documentation

## 2024-06-04 DIAGNOSIS — M359 Systemic involvement of connective tissue, unspecified: Secondary | ICD-10-CM | POA: Insufficient documentation

## 2024-06-04 DIAGNOSIS — M797 Fibromyalgia: Secondary | ICD-10-CM | POA: Insufficient documentation

## 2024-06-04 DIAGNOSIS — S322XXD Fracture of coccyx, subsequent encounter for fracture with routine healing: Secondary | ICD-10-CM | POA: Insufficient documentation

## 2024-06-04 DIAGNOSIS — Z79899 Other long term (current) drug therapy: Secondary | ICD-10-CM | POA: Insufficient documentation

## 2024-06-04 DIAGNOSIS — M19071 Primary osteoarthritis, right ankle and foot: Secondary | ICD-10-CM | POA: Insufficient documentation

## 2024-06-04 DIAGNOSIS — E538 Deficiency of other specified B group vitamins: Secondary | ICD-10-CM | POA: Diagnosis present

## 2024-06-04 DIAGNOSIS — M19041 Primary osteoarthritis, right hand: Secondary | ICD-10-CM | POA: Diagnosis present

## 2024-06-04 DIAGNOSIS — M65331 Trigger finger, right middle finger: Secondary | ICD-10-CM | POA: Diagnosis present

## 2024-06-04 DIAGNOSIS — M19042 Primary osteoarthritis, left hand: Secondary | ICD-10-CM | POA: Diagnosis present

## 2024-06-04 DIAGNOSIS — M25462 Effusion, left knee: Secondary | ICD-10-CM | POA: Diagnosis present

## 2024-06-04 DIAGNOSIS — M810 Age-related osteoporosis without current pathological fracture: Secondary | ICD-10-CM | POA: Insufficient documentation

## 2024-06-04 DIAGNOSIS — M19072 Primary osteoarthritis, left ankle and foot: Secondary | ICD-10-CM | POA: Diagnosis present

## 2024-06-04 DIAGNOSIS — M1611 Unilateral primary osteoarthritis, right hip: Secondary | ICD-10-CM | POA: Diagnosis present

## 2024-06-04 DIAGNOSIS — E559 Vitamin D deficiency, unspecified: Secondary | ICD-10-CM | POA: Diagnosis present

## 2024-06-04 DIAGNOSIS — Z8669 Personal history of other diseases of the nervous system and sense organs: Secondary | ICD-10-CM | POA: Insufficient documentation

## 2024-06-04 NOTE — Patient Instructions (Signed)
CMC joint brace.

## 2024-06-05 ENCOUNTER — Ambulatory Visit: Admitting: Physician Assistant

## 2024-06-05 DIAGNOSIS — M51369 Other intervertebral disc degeneration, lumbar region without mention of lumbar back pain or lower extremity pain: Secondary | ICD-10-CM

## 2024-06-05 DIAGNOSIS — M797 Fibromyalgia: Secondary | ICD-10-CM

## 2024-06-05 DIAGNOSIS — Z8619 Personal history of other infectious and parasitic diseases: Secondary | ICD-10-CM

## 2024-06-05 DIAGNOSIS — M65331 Trigger finger, right middle finger: Secondary | ICD-10-CM

## 2024-06-05 DIAGNOSIS — M19071 Primary osteoarthritis, right ankle and foot: Secondary | ICD-10-CM

## 2024-06-05 DIAGNOSIS — S322XXD Fracture of coccyx, subsequent encounter for fracture with routine healing: Secondary | ICD-10-CM

## 2024-06-05 DIAGNOSIS — E538 Deficiency of other specified B group vitamins: Secondary | ICD-10-CM

## 2024-06-05 DIAGNOSIS — Z853 Personal history of malignant neoplasm of breast: Secondary | ICD-10-CM

## 2024-06-05 DIAGNOSIS — M5030 Other cervical disc degeneration, unspecified cervical region: Secondary | ICD-10-CM

## 2024-06-05 DIAGNOSIS — R5383 Other fatigue: Secondary | ICD-10-CM

## 2024-06-05 DIAGNOSIS — M359 Systemic involvement of connective tissue, unspecified: Secondary | ICD-10-CM

## 2024-06-05 DIAGNOSIS — G8929 Other chronic pain: Secondary | ICD-10-CM

## 2024-06-05 DIAGNOSIS — I10 Essential (primary) hypertension: Secondary | ICD-10-CM

## 2024-06-05 DIAGNOSIS — M1611 Unilateral primary osteoarthritis, right hip: Secondary | ICD-10-CM

## 2024-06-05 DIAGNOSIS — E559 Vitamin D deficiency, unspecified: Secondary | ICD-10-CM

## 2024-06-05 DIAGNOSIS — M19041 Primary osteoarthritis, right hand: Secondary | ICD-10-CM

## 2024-06-05 DIAGNOSIS — Z79899 Other long term (current) drug therapy: Secondary | ICD-10-CM

## 2024-06-05 DIAGNOSIS — M810 Age-related osteoporosis without current pathological fracture: Secondary | ICD-10-CM

## 2024-06-05 DIAGNOSIS — M25462 Effusion, left knee: Secondary | ICD-10-CM

## 2024-06-05 DIAGNOSIS — Z8669 Personal history of other diseases of the nervous system and sense organs: Secondary | ICD-10-CM

## 2024-06-06 ENCOUNTER — Telehealth: Payer: Self-pay | Admitting: Sports Medicine

## 2024-06-06 NOTE — Telephone Encounter (Signed)
 Called and spoke with patient she vocalized understanding

## 2024-06-06 NOTE — Telephone Encounter (Signed)
 Pt called, possible reaction to the prednisone . Woke up with facial flushing, HA and her BP is elevated at 149/89. Took first dose yesterday and second dose this morning.

## 2024-06-11 ENCOUNTER — Encounter (HOSPITAL_BASED_OUTPATIENT_CLINIC_OR_DEPARTMENT_OTHER): Payer: Self-pay | Admitting: Cardiovascular Disease

## 2024-06-11 ENCOUNTER — Ambulatory Visit (HOSPITAL_BASED_OUTPATIENT_CLINIC_OR_DEPARTMENT_OTHER): Payer: Medicare Other | Admitting: Cardiovascular Disease

## 2024-06-11 VITALS — BP 117/76 | HR 73 | Resp 16 | Ht 64.0 in | Wt 137.0 lb

## 2024-06-11 DIAGNOSIS — I10 Essential (primary) hypertension: Secondary | ICD-10-CM | POA: Diagnosis not present

## 2024-06-11 DIAGNOSIS — R0609 Other forms of dyspnea: Secondary | ICD-10-CM | POA: Insufficient documentation

## 2024-06-11 MED ORDER — LOSARTAN POTASSIUM 25 MG PO TABS: ORAL_TABLET | ORAL | 3 refills | Status: DC

## 2024-06-11 NOTE — Patient Instructions (Signed)
 Medication Instructions:  Your physician has recommended you make the following change in your medication:  START Losartan  25 mg in the morning   Testing/Procedures: Your physician has requested that you have an echocardiogram. Echocardiography is a painless test that uses sound waves to create images of your heart. It provides your doctor with information about the size and shape of your heart and how well your heart's chambers and valves are working. This procedure takes approximately one hour. There are no restrictions for this procedure. Please do NOT wear cologne, perfume, aftershave, or lotions (deodorant is allowed). Please arrive 15 minutes prior to your appointment time.  Please note: We ask at that you not bring children with you during ultrasound (echo/ vascular) testing. Due to room size and safety concerns, children are not allowed in the ultrasound rooms during exams. Our front office staff cannot provide observation of children in our lobby area while testing is being conducted. An adult accompanying a patient to their appointment will only be allowed in the ultrasound room at the discretion of the ultrasound technician under special circumstances. We apologize for any inconvenience.    Follow-Up: Please follow up in 6 months in [facility] with Dr. Raford, Reche Finder, NP or Kristin Alvstad PharmD

## 2024-06-11 NOTE — Progress Notes (Signed)
 Cardiology Office Note:  .   Date:  06/11/2024  ID:  [name], DOB 1946-07-21, MRN [id number] PCP: Geofm Glade PARAS, MD  [facility] Providers Cardiologist:  None    History of Present Illness: .    [name] is a 78 y.o. female with hypertension, hyperlipidemia, and prior breast cancer here for follow-up on labile hypertension.  She was first seen in advanced hypertension clinic 07/2023.  She reported fluctuating blood pressures despite being on 3 medications.  At the time she was on amlodipine , losartan , and Nebivolol .  Blood pressure in the office was 120/68.  Catecholamine and metanephrines were normal.  At her pharmacist appointment 08/2023 amlodipine  was stopped and her losartan  was increased to 50 mg twice daily.  At follow-up 09/2023 blood pressure was stable on losartan  twice daily and no other medications were added back.  She had an ED visit 11/2023 with elevated blood pressure then amlodipine  5 mg was restarted.  She saw Reche Finder, NP 12/2023 and her blood pressure was well-controlled on losartan  and amlodipine .  She had mild anemia that was not thought to be contributory to any of her symptoms.  She previously saw Dr. Rolan 2011 for chest tightness.  Symptoms were atypical.  She was overall thought to be low risk other than uncontrolled cholesterol.  He recommended stress testing but she declined.  Discussed the use of AI scribe software for clinical note transcription with the patient, who gave verbal consent to proceed.  History of Present Illness [name] has been experiencing low blood pressure with readings in the 90s and 60s, leading to fatigue and lack of energy. Her blood pressure is typically lower in the mornings before taking her medication and improves around midday. She is currently on amlodipine  and losartan  for hypertension.  There is a family history of atrial septal defect, as she reports that her brother was recently hospitalized for  this condition and underwent a quadruple bypass and a surgery for atrial fibrillation. Following this, her brother's doctor recommended that she and another sibling undergo echocardiograms to be evaluated for ASD. She is unsure of the full details but mentions that her brother's children were involved in his medical appointments.  She experiences shortness of breath with exertion and has noted swelling, which she attributes to Achilles tendonitis in both heels. She has been exercising but feels tired and short of breath. Her current medications include amlodipine , which she takes in the morning, and losartan , which she takes twice daily. She recalls previously being on a regimen that included nebivolol  or Bystolic , along with amlodipine  and losartan , and notes that her weight has been stable.  She has no chest pain or pressure.  No palpitations.   ROS:  As per HPI  Studies Reviewed: [name]   EKG Interpretation Date/Time:  Tuesday June 11 2024 13:34:04 EDT Ventricular Rate:  73 PR Interval:  166 QRS Duration:  72 QT Interval:  372 QTC Calculation: 409 R Axis:   15  Text Interpretation: Normal sinus rhythm Low voltage QRS No significant change since last tracing Confirmed by Raford Riggs ([id number]) on 06/11/2024 1:43:59 PM   08/18/2023: Sinus rhythm.  Rate 61 bpm.  Risk Assessment/Calculations:             Physical Exam:   VS:  BP 117/76 (BP Location: Left Arm, Patient Position: Sitting, Cuff Size: Normal)   Pulse 73   Resp 16   Ht 5' 4 (1.626 m)   Wt 137 lb (62.1  kg) Comment: Patient reported  SpO2 98%   BMI 23.52 kg/m  , BMI Body mass index is 23.52 kg/m. GENERAL:  Well appearing HEENT: Pupils equal round and reactive, fundi not visualized, oral mucosa unremarkable NECK:  No jugular venous distention, waveform within normal limits, carotid upstroke brisk and symmetric, no bruits, no thyromegaly LUNGS:  Clear to auscultation bilaterally HEART:  RRR.  PMI not displaced or sustained,S1  and S2 within normal limits, no S3, no S4, no clicks, no rubs, no murmurs ABD:  Flat, positive bowel sounds normal in frequency in pitch, no bruits, no rebound, no guarding, no midline pulsatile mass, no hepatomegaly, no splenomegaly EXT:  2 plus pulses throughout, trace edema, no cyanosis no clubbing SKIN:  No rashes no nodules NEURO:  Cranial nerves II through XII grossly intact, motor grossly intact throughout PSYCH:  Cognitively intact, oriented to person place and time  ASSESSMENT AND PLAN: .    Assessment & Plan # Hypertension Hypertension controlled with current medications, but symptomatic hypotension occurs, especially in the mornings. Blood pressure readings symptomatic at 90s/60s. Plan to adjust medication to prevent hypotension while maintaining control. - Decrease morning dose of losartan  to 25 mg. - Continue evening dose of losartan  at 50 mg. - Continue amlodipine  5 mg daily.  # Family history of ASD:  # Exertional dyspnea:  [name] reports that she was told that she needs a screening echo based on her brother's ASD diagnosis.  I am not quite sure what to make of this.  She has no symptoms of an ASD.  If she had a large ASD by the age of 44, she would have presented with symptoms prior to now.  If she had a small ASD I would expect for her to have a murmur on exam.  She had neither of these.  She also does not have any EKG abnormalities.  Her heart was not enlarged on her chest CT.  She does report exertional dyspnea. I suppose it is reasonable to get an echocardiogram to evaluate that, though she does seem to think that is more related to age and deconditioning than an acute change.  We discussed the fact that I do not think she has any indications for intervention even if she did have a asymptomatic ASD at this point.  Will get the echocardiogram to better assess.  Follow-up Plan to monitor hypertension treatment effectiveness and blood pressure stability. Echocardiogram  ordered to evaluate for potential atrial septal defect due to family history and exertional dyspnea. Low likelihood of ASD intervention given age and lack of symptoms. - Schedule follow-up appointment in 6 months. - Order echocardiogram to evaluate for potential atrial septal defect.   Dispo: f/u in 6 months   Signed, Annabella Scarce, MD

## 2024-06-13 NOTE — Progress Notes (Signed)
 Office Visit Note  Patient: [name]             Date of Birth: 02/23/1946           MRN: [id number]             PCP: Geofm Glade PARAS, MD Referring: Geofm Glade PARAS, MD Visit Date: 06/27/2024 Occupation: @GUAROCC @  Subjective:  Pain in both feet   History of Present Illness: [name] is a 78 y.o. female with autoimmune disease and osteoarthritis.  She returns today after her last visit in June 2025.  She states for the last 2 months she has been having pain and discomfort in her bilateral ankles and heel.  She states she has been having burning sensation going from her ankles up to her leg.  She was diagnosed with bilateral Achilles tendinitis by Dr. Leonce.  He also thought that the symptoms are related to her lower back.  He recommended a cortisone injection to her lumbar spine.  She was also evaluated by physical therapist.  Who believes that the Achilles tendinitis and oscillated problem.  She has been doing stretches and physical therapy.  She states she was given a prednisone  taper in June but could not tolerate it. She continues to have fatigue and some generalized pain and discomfort.  She continues to have dry mouth, dry eyes.  She continues to have some discomfort in her right shoulder joint which is manageable.  She has been having increased pain in her bilateral CMC joints.  She states her left knee appears to be puffy.  She notices increased fluid retention towards the end of the day.  She continues to have some neck pain.  The lower back pain persists.  She has generalized achiness from fibromyalgia.    Activities of Daily Living:  Patient reports morning stiffness for 3 hours.   Patient Denies nocturnal pain.  Difficulty dressing/grooming: Reports Difficulty climbing stairs: Reports Difficulty getting out of chair: Reports Difficulty using hands for taps, buttons, cutlery, and/or writing: Reports  Review of Systems  Constitutional:  Positive for fatigue.   HENT:  Positive for mouth dryness. Negative for mouth sores.   Eyes:  Positive for dryness.  Respiratory:  Negative for shortness of breath and difficulty breathing.   Cardiovascular:  Positive for swelling in legs/feet. Negative for hypertension.  Gastrointestinal:  Negative for blood in stool, constipation and diarrhea.  Endocrine: Negative for increased urination.  Genitourinary:  Negative for involuntary urination.  Musculoskeletal:  Positive for joint pain, joint pain, myalgias, morning stiffness and myalgias. Negative for gait problem, joint swelling, muscle weakness and muscle tenderness.  Skin:  Negative for color change, rash, hair loss and sensitivity to sunlight.  Allergic/Immunologic: Negative for susceptible to infections.  Neurological:  Negative for dizziness and headaches.  Hematological:  Negative for swollen glands.  Psychiatric/Behavioral:  Negative for depressed mood and sleep disturbance. The patient is not nervous/anxious.     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   Other chest pain 03/10/2020   Shingles 11/19/2019   Pain in both knees 07/09/2019   Right arm pain 02/26/2019   Acute pain of left knee 08/27/2018   Hand arthritis 06/13/2018   Trigger middle finger of right hand 06/13/2018   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   Intermittent palpitations 10/11/2017   Migraine 12/30/2015   Upper airway  cough syndrome 10/29/2015   Osteoporosis 06/06/2011   Vitamin D  deficiency 03/22/2010   BREAST CANCER, HX OF 03/22/2010   HYPERLIPIDEMIA 09/28/2007   Fibromyalgia 05/24/2007    Past Medical History:  Diagnosis Date   Arthritis    Breast cancer (HCC)    R lumpectomy ; radiation; no chemotherapy   Bronchitis    recurrent   Bulging lumbar disc    Cataract    multifocal lens implant   Chronic low back pain    bulging disc & spinal stenosis   Coccygeal fracture (HCC) 05/15/2021   COVID-19    COVID-19 02/2023   Cystitis    Exertional dyspnea 06/11/2024   Fibromyalgia    Hyperlipemia    Mitral valve prolapse    Osteoporosis    Personal history of radiation therapy    Pneumonia    as OP X 2; no Pneumovax   Ruptured disc, thoracic    Stage 3 chronic kidney disease (HCC)    Tendinitis of both ankles 05/29/2024   Tendinitis of both feet 05/29/2024   Vitamin D  deficiency    19 in  01/2009    Family History  Problem Relation Age of Onset   Atrial fibrillation Mother        S/P cardioversion   Ulcers Mother    Hypertension Mother    Dementia Mother    Heart attack Father 7       died @ 86   Hypertension Brother    Other Brother        Atrial septum defect   Diabetes Brother    Atrial fibrillation Brother    Peptic Ulcer Brother    Heart attack Paternal Grandmother        < 65   Heart attack Paternal Grandfather        > 55   Thyroid  disease Daughter        on Synthroid since high school   Healthy Son    Heart attack Paternal Aunt        < 65   Heart attack Paternal Uncle        > 55   Stroke Neg Hx    Breast cancer Neg Hx    Past Surgical History:  Procedure Laterality Date   ABDOMINAL HYSTERECTOMY  1990   [facility] for fibroid , Dr Freya   BREAST LUMPECTOMY Right    BREAST SURGERY  2010   R side lumpectomy; Dr Merrilyn   CATARACT EXTRACTION, BILATERAL  2017   CESAREAN SECTION      G 2 P 2   CYSTECTOMY  2010   EYE SURGERY     eye lid surgery   FOOT SURGERY  Right    no colonoscopy     I just don't want to do it. SOC reviewed   Social History   Social History Narrative   Lives with husband   Immunization History  Administered Date(s) Administered   Fluad Quad(high Dose 65+) 10/02/2019, 09/09/2022   Fluad Trivalent(High Dose 65+) 09/11/2023   PFIZER(Purple Top)SARS-COV-2 Vaccination 01/08/2020, 01/27/2020, 10/17/2020  Td 12/20/1999     Objective: Vital Signs: BP 94/63 (BP Location: Left Arm, Patient Position: Sitting, Cuff Size: Normal)   Pulse 80   Resp 16   Ht 5' 4.5 (1.638 m)   BMI 23.32 kg/m    Physical Exam Vitals and nursing note reviewed.  Constitutional:      Appearance: She is well-developed.  HENT:     Head: Normocephalic and atraumatic.  Eyes:     Conjunctiva/sclera: Conjunctivae normal.  Cardiovascular:     Rate and Rhythm: Normal rate and regular rhythm.     Heart sounds: Normal heart sounds.  Pulmonary:     Effort: Pulmonary effort is normal.     Breath sounds: Normal breath sounds.  Abdominal:     General: Bowel sounds are normal.     Palpations: Abdomen is soft.  Musculoskeletal:     Cervical back: Normal range of motion.  Lymphadenopathy:     Cervical: No cervical adenopathy.  Skin:    General: Skin is warm and dry.     Capillary Refill: Capillary refill takes less than 2 seconds.  Neurological:     Mental Status: She is alert and oriented to person, place, and time.  Psychiatric:        Behavior: Behavior normal.      Musculoskeletal Exam: Cervical, thoracic and lumbar spine were in good range of motion.  She has some stiffness range of motion of her cervical spine.  Shoulders, elbows, wrist joints, MCPs PIPs and DIPs with good range of motion.  She has synovitis over some of the MCPs and PIP joints as described below.  She tenderness over bilateral CMC joints.  Hip joints and knee joints with good range of motion.  She had warmth swelling and effusion in her left knee joint.  She has swelling  over the left midfoot.  She tenderness over bilateral posterior calcaneal spurs.  None of the other joints were swollen.    CDAI Exam: CDAI Score: 16  Patient Global: 30 / 100; Provider Global: 50 / 100 Swollen: 5 ; Tender: 5  Joint Exam 06/27/2024      Right  Left  MCP 2  Swollen Tender  Swollen Tender  MCP 3  Swollen Tender     Knee     Swollen Tender  Tarsometatarsal     Swollen Tender     Investigation: No additional findings.  Imaging: DG Foot Complete Left Result Date: 05/29/2024 CLINICAL DATA:  Bilateral heel pain for a few months. EXAM: LEFT FOOT - COMPLETE 3+ VIEW COMPARISON:  Left foot radiographs 01/19/2021 FINDINGS: Minimal lateral great toe metatarsophalangeal degenerative spurring without joint space narrowing. Mild 2 moderate chronic enthesopathic change at the Achilles tendon insertion on the calcaneus. Mild posterior dorsal navicular degenerative osteophytosis, similar to prior. No acute fracture or dislocation. IMPRESSION: Mild to moderate chronic enthesopathic change at the Achilles tendon insertion on the calcaneus. Electronically Signed   By: Tanda Lyons M.D.   On: 05/29/2024 18:23   DG Foot Complete Right Result Date: 05/29/2024 CLINICAL DATA:  Bilateral heel pain for few months. No known injury. EXAM: RIGHT FOOT COMPLETE - 3+ VIEW COMPARISON:  Right foot radiographs 06/28/2022 FINDINGS: Moderate great toe metatarsophalangeal joint space narrowing, greatest medially, and moderate dorsal and mild peripheral lateral greater than medial degenerative osteophytes. A screw overlies the distal fifth metatarsal, unchanged. There is attenuation of the distal aspect of the proximal phalanx of the fifth toe, possibly postsurgical. Small plantar and posterior calcaneal heel spurs.  No acute fracture or dislocation. IMPRESSION: 1. Moderate great toe metatarsophalangeal joint osteoarthritis. 2. Small plantar and posterior calcaneal heel spurs. Electronically Signed   By: Tanda Lyons M.D.   On: 05/29/2024 18:21    Recent Labs: Lab Results  Component Value Date   WBC 4.5 01/23/2024   HGB 11.9 01/23/2024   PLT 287 01/23/2024   NA 139 01/23/2024   K 4.7 01/23/2024   CL 106 01/23/2024   CO2 24 01/23/2024   GLUCOSE 91 01/23/2024   BUN 18 01/23/2024   CREATININE 1.07 (H) 01/23/2024   BILITOT 0.3 01/23/2024   ALKPHOS 65 03/10/2023   AST 18 01/23/2024   ALT 16 01/23/2024   PROT 7.0 01/23/2024   ALBUMIN 4.7 03/10/2023   CALCIUM  10.2 01/23/2024   GFRAA 58 (L) 05/24/2021   QFTBGOLDPLUS NEGATIVE 10/27/2021   January 23, 2024 urine protein creatinine ratio normal, complements normal, double-stranded DNA negative, sed rate normal  October 27, 2021 TB Gold negative, hepatitis B nonreactive, hepatitis C nonreactive, immunoglobulins normal except IgM low June 30, 2021 IFE normal  Speciality Comments: CORINN Eye Exam: 05/30/2024 WNL @ [facility] Follow up in 6 months.  Procedures:  No procedures performed Allergies: Morphine, Albuterol, Augmentin [amoxicillin-pot clavulanate], Ciprofloxacin, Fish allergy, Gabapentin , Prednisone , Risedronate sodium, Cyclobenzaprine , and Ibandronate sodium   Assessment / Plan:     Visit Diagnoses: Autoimmune disease (HCC) - Positive ANA, dry eyes, hair loss, Raynaud's, inflammatory arthritis: -Patient complains of increased pain and swelling in multiple joints.  Synovitis was noted over several of her MCPs PIPs, left knee joint and left midfoot.  I did detailed discussion with the patient regarding different treatment options.  She has been taking Plaquenil  not a regular basis.  She tried Imuran  in the past could not tolerate.  After detailed discussion we decided to proceed with leflunomide 10 mg p.o. daily.  Side effects were discussed and handout was given.  Consent was obtained.  Will start her on leflunomide 10 mg p.o. daily after the labs are available.  Plan: Sedimentation rate, Rheumatoid factor, Cyclic citrul peptide  antibody, IgG, ANA, Protein / creatinine ratio, urine, Anti-DNA antibody, double-stranded, C3 and C4  Medication counseling:   Baseline Immunosuppressant Therapy Labs     Latest Ref Rng & Units 10/27/2021   11:35 AM  Quantiferon TB Gold  Quantiferon TB Gold Plus NEGATIVE NEGATIVE        Latest Ref Rng & Units 10/27/2021   11:35 AM  Hepatitis  Hep B Surface Ag NON-REACTIVE NON-REACTIVE   Hep B IgM NON-REACTIVE NON-REACTIVE   Hep C Ab NON-REACTIVE NON-REACTIVE        Latest Ref Rng & Units 10/27/2021   11:35 AM  Immunoglobulin Electrophoresis  IgA  70 - 320 mg/dL 867   IgG 399 - 8,459 mg/dL 252   IgM 50 - 699 mg/dL 46      Patient was counseled on the purpose, proper use, and adverse effects of leflunomide including risk of infection, nausea/diarrhea/weight loss, increase in blood pressure, rash, hair loss, tingling in the hands and feet, and signs and symptoms of interstitial lung disease.   Also counseled on Black Box warning of liver injury and importance of avoiding alcohol while on therapy. Discussed that there is the possibility of an increased risk of malignancy but it is not well understood if this increased risk is due to the medication or the disease state.  Counseled patient to avoid live vaccines. Recommend annual influenza, Pneumovax 23, Prevnar 13,  and Shingrix as indicated.   Discussed the importance of frequent monitoring of liver function and blood count.  Standing orders placed. Provided patient with educational materials on leflunomide and answered all questions.  Patient consented to Arava use, and consent will be uploaded into the media tab.   Patient dose will be 10 mg p.o. daily.  Prescription pending lab results and/or insurance approval.   High risk medication use - Plaquenil  200 mg 1 tablet BID Monday to Friday. Intolerance of imuran -nausea and anemia. PLQ Eye Exam: 05/30/2024 WNL @ [facility] - Plan: CBC with Differential/Platelet, Comprehensive  metabolic panel with GFR, QuantiFERON-TB Gold Plus.  Advised her to get labs 2 weeks after starting leflunomide then 2 months and then every 3 months.  Information reimmunization was placed in the AVS.  She was advised to hold leflunomide if she develops an infection resume of the infection results.  Chronic right shoulder pain-she has intermittent discomfort in her right shoulder which is not painful today.  Trigger middle finger of right hand-she complains of intermittent triggering.  Primary osteoarthritis of both hands-she has inflammatory arthritis and osteoarthritis overlap with synovitis in multiple joints as described above.  Primary osteoarthritis of right hip-she good range of motion without discomfort today.  Effusion, left knee-she continues to have pain and swelling and effusion in the left knee joint.  Her left knee joint was aspirated couple of years ago which was negative for crystals.  Primary osteoarthritis of both feet-she continues to pain and discomfort in her feet.  She had swelling in her left midfoot.  She has been experiencing pain and discomfort in her bilateral heels.  She states she was told that that she has Achilles tendinitis.  Her Achilles tendon was not tender on the examination.  Although she had tenderness over bilateral posterior calcaneal spurs.  Proper fitting shoes were advised.  I also advised her to see a podiatrist if she has persistent symptoms.  DDD (degenerative disc disease), cervical -she continues to have some stiffness.  X-rays from March 29, 2023 showed degenerative changes and posterior osteophytes at C6- C7.  Evaluated by Dr. Beuford in the past. Chronic pain  Lumbar spondylosis - degenerative changes seen most pronounced at L4-L5 L5-S1 from MRI of the lumbar on March 2023.  She is currently not having any lower back discomfort.  Age-related osteoporosis without current pathological fracture - DEXA updated on 09/12/2023: LFN T-score -2.4, RFN  T-score -2.3, lumbarT-score -1.6.  Previous DEXA 04/23/21: LFN T score -2.7.  Intolerance to Fosamax,Boniva.  I discussed subcutaneous Prolia but patient declined.  Vitamin D  deficiency-vitamin D  was normal in April 2023.  The medical problems are listed as follows:  Closed fracture of coccyx with routine healing, subsequent encounter  Fibromyalgia-she continues to have generalized pain and discomfort from fibromyalgia.  Other fatigue-she has chronic fatigue.  Primary hypertension-blood pressure was normal.  History of shingles  History of breast cancer  Vitamin B12 deficiency  Hx of migraines  Orders: Orders Placed This Encounter  Procedures   CBC with Differential/Platelet   Comprehensive metabolic panel with GFR   Sedimentation rate   Rheumatoid factor   Cyclic citrul peptide antibody, IgG   ANA   Protein / creatinine ratio, urine   Anti-DNA antibody, double-stranded   C3 and C4   QuantiFERON-TB Gold Plus   No orders of the defined types were placed in this encounter.    Follow-Up Instructions: Return in about 2 months (around 08/28/2024) for Autoimmune disease.  Maya Nash, MD  Note - This record has been created using Animal nutritionist.  Chart creation errors have been sought, but may not always  have been located. Such creation errors do not reflect on  the standard of medical care.

## 2024-06-15 ENCOUNTER — Other Ambulatory Visit: Payer: Self-pay | Admitting: Physician Assistant

## 2024-06-15 ENCOUNTER — Other Ambulatory Visit (HOSPITAL_BASED_OUTPATIENT_CLINIC_OR_DEPARTMENT_OTHER): Payer: Self-pay | Admitting: Family

## 2024-06-15 DIAGNOSIS — I10 Essential (primary) hypertension: Secondary | ICD-10-CM

## 2024-06-17 ENCOUNTER — Telehealth: Payer: Self-pay

## 2024-06-17 ENCOUNTER — Other Ambulatory Visit (HOSPITAL_COMMUNITY): Payer: Self-pay

## 2024-06-17 ENCOUNTER — Telehealth: Payer: Self-pay | Admitting: Pharmacy Technician

## 2024-06-17 DIAGNOSIS — I10 Essential (primary) hypertension: Secondary | ICD-10-CM

## 2024-06-17 MED ORDER — LOSARTAN POTASSIUM 50 MG PO TABS: ORAL_TABLET | ORAL | 3 refills | Status: DC

## 2024-06-17 NOTE — Telephone Encounter (Signed)
 Last Fill: 03/25/2024  Eye exam: 05/30/2024 WNL   Labs: 01/23/2024 RBC count remains borderline low but has improved.  Hemoglobin and hematocrit have returned to WNL.  Rest of CBC WNL.  ESR WNL  Complements Wnl  Creatinine remains slightly elevated-1.07 and GFR is low-54.    Next Visit: 06/27/2024  Patient to update labs at next appointment  Last Visit: 06/04/2024  DX: Autoimmune disease   Current Dose per office note 06/04/2024: Plaquenil  200 mg 1 tablet by mouth twice daily Monday to Friday.   Okay to refill Plaquenil ?

## 2024-06-17 NOTE — Telephone Encounter (Signed)
 Insurance will not pay for the losartan  25mg  at the 1 tablet every morning and 2 tablets every evening because they said that is more than the max of 2 a day per the plan. They will pay for losartan  50mg  at 1/2 tablet every morning and 1 tablet every evening. The patient just filled the 50mg  with the old directions of 1 twice a day on 04/21/24, so insurance said they will fill losartan  50mg  again with the new directions on 07/11/24. Can this losartan  25mg  prescription be changed to 50mg  with the new directions reflected? Thank you!

## 2024-06-17 NOTE — Telephone Encounter (Signed)
 error

## 2024-06-17 NOTE — Telephone Encounter (Signed)
 Dr. Raford pt.  Will forward this message to our [facility] office for further management.

## 2024-06-25 NOTE — Progress Notes (Unsigned)
    [name] [address] [place] [address] Phone: [phone]   Assessment and Plan:     There are no diagnoses linked to this encounter.  ***   Pertinent previous records reviewed include ***    Follow Up: ***     Subjective:   I, Karmon Andis, am serving as a Neurosurgeon for Doctor Morene Mace  Chief Complaint: bilat foot pain    HPI:    05/29/24 Patient is a 78 year old female with bilat foot pain. Patient states it burns from the feet to the knee. Heel is so sore that she can not stand anything on it not even shoes. When she walks her feet want to turn out instead of staying straight. Pain does not wake her up at night. Has MRI of her back with her. Dr. Geofm thinks that this pain is coming from her back.   Duration?over a month Did you have an Injury to cause this pain?no Taking Medication for pain? Extra strength tylenol, tramadol  Numbness or Tingling?no Does the pain Radiate? no Altered gait or use?yes ROM/ impairment of movement?yes   06/26/2024 Patient states   Relevant Historical Information:   CKD stage IIIa, hypertension  Additional pertinent review of systems negative.   Current Outpatient Medications:    acetaminophen (TYLENOL) 500 MG tablet, Take 1,000 mg by mouth daily as needed for mild pain., Disp: , Rfl:    amLODipine  (NORVASC ) 5 MG tablet, TAKE 1 TABLET(5 MG) BY MOUTH DAILY, Disp: 90 tablet, Rfl: 1   CALCIUM  PO, Take by mouth daily., Disp: , Rfl:    cholecalciferol (VITAMIN D3) 25 MCG (1000 UNIT) tablet, Take 1,000 Units by mouth daily., Disp: , Rfl:    CRANBERRY PO, Take by mouth., Disp: , Rfl:    Cyanocobalamin  (B-12 PO), Take by mouth., Disp: , Rfl:    eletriptan  (RELPAX ) 20 MG tablet, TAKE 1 TABLET BY MOUTH EVERY 2 HOURS AS NEEDED FOR HEADACHE OR MIGRAINE. MAY REPEAT IN 2 HOURS IF HEADACHE PERSISTS OR RECURS., Disp: 9 tablet, Rfl: 1   hydroxychloroquine  (PLAQUENIL ) 200 MG tablet,  TAKE 1 TABLET BY MOUTH TWICE DAILY, MONDAY THROUGH FRIDAY, Disp: 120 tablet, Rfl: 0   losartan  (COZAAR ) 50 MG tablet, 50mg  at 1/2 tablet every morning and 1 tablet every evening., Disp: 135 tablet, Rfl: 3   Probiotic Product (PROBIOTIC DAILY PO), Take by mouth., Disp: , Rfl:    RESTASIS 0.05 % ophthalmic emulsion, SMARTSIG:1 Drop(s) In Eye(s) Every 12 Hours, Disp: , Rfl:    traMADol  (ULTRAM ) 50 MG tablet, Take 1 tablet (50 mg total) by mouth every 12 (twelve) hours as needed., Disp: 60 tablet, Rfl: 0   traZODone  (DESYREL ) 50 MG tablet, TAKE 3 TABLETS(150 MG) BY MOUTH AT BEDTIME, Disp: 270 tablet, Rfl: 1   Objective:     There were no vitals filed for this visit.    There is no height or weight on file to calculate BMI.    Physical Exam:    ***   Electronically signed by:  Odis Mace D.[facility] 7:38 AM 06/25/24

## 2024-06-26 ENCOUNTER — Encounter: Payer: Self-pay | Admitting: Physical Therapy

## 2024-06-26 ENCOUNTER — Ambulatory Visit: Admitting: Sports Medicine

## 2024-06-26 ENCOUNTER — Ambulatory Visit (INDEPENDENT_AMBULATORY_CARE_PROVIDER_SITE_OTHER): Admitting: Physical Therapy

## 2024-06-26 ENCOUNTER — Other Ambulatory Visit: Payer: Self-pay

## 2024-06-26 VITALS — HR 69 | Ht 64.0 in | Wt 138.0 lb

## 2024-06-26 DIAGNOSIS — M25571 Pain in right ankle and joints of right foot: Secondary | ICD-10-CM

## 2024-06-26 DIAGNOSIS — R2689 Other abnormalities of gait and mobility: Secondary | ICD-10-CM

## 2024-06-26 DIAGNOSIS — M25572 Pain in left ankle and joints of left foot: Secondary | ICD-10-CM | POA: Diagnosis not present

## 2024-06-26 DIAGNOSIS — G8929 Other chronic pain: Secondary | ICD-10-CM

## 2024-06-26 DIAGNOSIS — M5442 Lumbago with sciatica, left side: Secondary | ICD-10-CM

## 2024-06-26 DIAGNOSIS — M6281 Muscle weakness (generalized): Secondary | ICD-10-CM | POA: Diagnosis not present

## 2024-06-26 DIAGNOSIS — M5441 Lumbago with sciatica, right side: Secondary | ICD-10-CM

## 2024-06-26 DIAGNOSIS — M79671 Pain in right foot: Secondary | ICD-10-CM

## 2024-06-26 DIAGNOSIS — M79672 Pain in left foot: Secondary | ICD-10-CM

## 2024-06-26 NOTE — Patient Instructions (Signed)
 Access Code: [email or web address] URL: [email or web address] Date: 06/26/2024 Prepared by: Elaine Daring  Exercises - Long Sitting Ankle Plantar Flexion with Resistance  - 1 x daily - 3 sets - 10 reps

## 2024-06-26 NOTE — Patient Instructions (Signed)
 Tylenol [phone] mg 2-3 times a day for pain relief  Continue HEP and PT as tolerated Epidural left L5-S1 Follow up 2 weeks after epidural to discuss results

## 2024-06-26 NOTE — Therapy (Signed)
 OUTPATIENT PHYSICAL THERAPY EVALUATION   Patient Name: [name] MRN: [id number] DOB:November 01, 1946, 78 y.o., female Today's Date: 06/26/2024   END OF SESSION:  PT End of Session - 06/26/24 1321     Visit Number 1    Number of Visits 9    Date for PT Re-Evaluation 08/21/24    Authorization Type MCR    Progress Note Due on Visit 10    PT Start Time 1345    PT Stop Time 1430    PT Time Calculation (min) 45 min    Activity Tolerance Patient tolerated treatment well    Behavior During Therapy WFL for tasks assessed/performed          Past Medical History:  Diagnosis Date   Arthritis    Breast cancer (HCC)    R lumpectomy ; radiation; no chemotherapy   Bronchitis    recurrent   Bulging lumbar disc    Cataract    multifocal lens implant   Chronic low back pain    bulging disc & spinal stenosis   Coccygeal fracture (HCC) 05/15/2021   COVID-19    COVID-19 02/2023   Cystitis    Exertional dyspnea 06/11/2024   Fibromyalgia    Hyperlipemia    Mitral valve prolapse    Osteoporosis    Personal history of radiation therapy    Pneumonia    as OP X 2; no Pneumovax   Ruptured disc, thoracic    Stage 3 chronic kidney disease (HCC)    Tendinitis of both ankles 05/29/2024   Tendinitis of both feet 05/29/2024   Vitamin D  deficiency    19 in  01/2009   Past Surgical History:  Procedure Laterality Date   ABDOMINAL HYSTERECTOMY  1990   [facility] for fibroid , Dr Freya   BREAST LUMPECTOMY Right    BREAST SURGERY  2010   R side lumpectomy; Dr Merrilyn   CATARACT EXTRACTION, BILATERAL  2017   CESAREAN SECTION      G 2 P 2   CYSTECTOMY  2010   EYE SURGERY     eye lid surgery   FOOT SURGERY Right    no colonoscopy     I just don't want to do it. 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   Other chest pain 03/10/2020   Shingles 11/19/2019   Pain in both knees 07/09/2019   Right arm pain 02/26/2019   Acute pain of left knee 08/27/2018   Hand arthritis 06/13/2018   Trigger middle finger of right hand 06/13/2018   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   Intermittent palpitations 10/11/2017   Migraine 12/30/2015   Upper airway cough syndrome 10/29/2015   Osteoporosis 06/06/2011   Vitamin D  deficiency 03/22/2010   BREAST CANCER, HX OF 03/22/2010   HYPERLIPIDEMIA 09/28/2007   Fibromyalgia 05/24/2007    PCP: Geofm Glade PARAS, MD  REFERRING PROVIDER: Leonce Katz, DO  REFERRING DIAG: Bilateral foot pain  THERAPY  DIAG:  No diagnosis found.  Rationale for Evaluation and Treatment: Rehabilitation  ONSET DATE: ***   SUBJECTIVE:  SUBJECTIVE STATEMENT: Patient reports her heels and ankle have been swelling and it has been very painful, and there is a burning in the back of her lower leg. The left is much worse. This has been going on for about a month or two, it just came on. She states she was probably doing a lot more walking at the time. She also notes her ankles are stiff and sore in the morning and the right one will work out better than the left. She reports trouble walking and she is limited with about anything where she is on her feet, and it does hurt to move her ankles. Going down steps is very painful. She reports that lying down feels much better and the swelling in her ankles and feet will go down over night. She does feel tingling on top of the foot usually in the evening  when her feet are the most swollen. She does have a history of lower back issues and the doctor thinks the pain is coming from the back, and she had an epidural ordered today. Prior to her feet hurting, she was walking a few miles per day, and now she thinks she could only walk about a 1/4 of a mile and that is painful.   PERTINENT HISTORY: See PMH above  PAIN:  Are you having pain? Yes:  NPRS scale: 6-7/10 currently Pain location: Bilateral ankles, heels, calves Pain description: Sore Aggravating factors: Walking or being on feet Relieving factors: Rest off feet, lying down  PRECAUTIONS: None  RED FLAGS: None   WEIGHT BEARING RESTRICTIONS: No  FALLS:  Has patient fallen in last 6 months? No  LIVING ENVIRONMENT: Lives with: lives with their family Lives in: House/apartment Stairs: Yes: Internal: 3 levels steps; railing on one side  PLOF: Independent  PATIENT GOALS: ***   OBJECTIVE:  Note: Objective measures were completed at Evaluation unless otherwise noted. PATIENT SURVEYS:  {rehab surveys:24030}  COGNITION: Overall cognitive status: Within functional limits for tasks assessed     SENSATION: {sensation:27233}  MUSCLE LENGTH: ***  POSTURE:   ***  PALPATION: ***  LUMBAR ROM:   Active  A/PROM  eval  Flexion   Extension   Right lateral flexion   Left lateral flexion   Right rotation   Left rotation    (Blank rows = not tested)  LOWER EXTREMITY ROM:  {AROM/PROM:27142} ROM Right eval Left eval  Hip flexion    Hip extension    Hip abduction    Hip adduction    Hip internal rotation    Hip external rotation    Knee flexion    Knee extension    Ankle dorsiflexion 0 5 lacking  Ankle plantarflexion 60 60  Ankle inversion 35 40  Ankle eversion 10 5   (Blank rows = not tested)  LOWER EXTREMITY MMT:  MMT Right eval Left eval  Hip flexion    Hip extension    Hip abduction    Hip adduction    Hip internal rotation    Hip external  rotation    Knee flexion    Knee extension    Ankle dorsiflexion    Ankle plantarflexion    Ankle inversion    Ankle eversion     (Blank rows = not tested)  FUNCTIONAL TESTS:  {Functional tests:24029}  GAIT: Assistive device utilized: None Level of assistance: Complete Independence Comments: ***  TREATMENT [facility] Adult PT Treatment:                                                DATE: 06/26/2024 ***  PATIENT EDUCATION:  Education details: Exam findings, POC, HEP Person educated: Patient Education method: Explanation, Demonstration, Tactile cues, Verbal cues, and Handouts Education comprehension: verbalized understanding, returned demonstration, verbal cues required, tactile cues required, and needs further education  HOME EXERCISE PROGRAM: Access Code: [email or web address]    ASSESSMENT: CLINICAL IMPRESSION: Patient is a 78 y.o. female who was seen today for physical therapy evaluation and treatment for ***.   OBJECTIVE IMPAIRMENTS: {opptimpairments:25111}.   ACTIVITY LIMITATIONS: {activitylimitations:27494}  PARTICIPATION LIMITATIONS: {participationrestrictions:25113}  PERSONAL FACTORS: {Personal factors:25162} are also affecting patient's functional outcome.   REHAB POTENTIAL: {rehabpotential:25112}  CLINICAL DECISION MAKING: {clinical decision making:25114}  EVALUATION COMPLEXITY: {Evaluation complexity:25115}   GOALS: Goals reviewed with patient? Yes  SHORT TERM GOALS: Target date: 07/24/2024  Patient will be I with initial HEP in order to progress with therapy. Baseline: HEP provided at eval Goal status: INITIAL  2.  *** Baseline:  Goal status: INITIAL  3.  *** Baseline:  Goal status: INITIAL  LONG TERM GOALS: Target date: 08/21/2024  Patient will be I with final HEP to maintain progress from PT. Baseline: HEP provided at eval Goal  status: INITIAL  2.  *** Baseline:  Goal status: INITIAL  3.  *** Baseline:  Goal status: INITIAL  4.  *** Baseline:  Goal status: INITIAL   PLAN: PT FREQUENCY: {rehab frequency:25116}  PT DURATION: 8 weeks  PLANNED INTERVENTIONS: {rehab planned interventions:25118::97110-Therapeutic exercises,97530- Therapeutic [phone]- Neuromuscular re-education,97535- Self Rjmz,02859- Manual therapy}  PLAN FOR NEXT SESSION: Review HEP and progress PRN, ***    Elaine Daring, PT, DPT, LAT, ATC 06/26/24  1:22 PM Phone: [phone] Fax: [phone]

## 2024-06-27 ENCOUNTER — Encounter: Payer: Self-pay | Admitting: Rheumatology

## 2024-06-27 ENCOUNTER — Other Ambulatory Visit: Payer: Self-pay | Admitting: *Deleted

## 2024-06-27 ENCOUNTER — Ambulatory Visit: Payer: Medicare Other | Attending: Rheumatology | Admitting: Rheumatology

## 2024-06-27 VITALS — BP 94/63 | HR 80 | Resp 16 | Ht 64.5 in

## 2024-06-27 DIAGNOSIS — M19042 Primary osteoarthritis, left hand: Secondary | ICD-10-CM | POA: Diagnosis present

## 2024-06-27 DIAGNOSIS — M19041 Primary osteoarthritis, right hand: Secondary | ICD-10-CM | POA: Insufficient documentation

## 2024-06-27 DIAGNOSIS — Z79899 Other long term (current) drug therapy: Secondary | ICD-10-CM

## 2024-06-27 DIAGNOSIS — M65331 Trigger finger, right middle finger: Secondary | ICD-10-CM | POA: Diagnosis present

## 2024-06-27 DIAGNOSIS — M810 Age-related osteoporosis without current pathological fracture: Secondary | ICD-10-CM | POA: Diagnosis present

## 2024-06-27 DIAGNOSIS — M1611 Unilateral primary osteoarthritis, right hip: Secondary | ICD-10-CM | POA: Diagnosis present

## 2024-06-27 DIAGNOSIS — Z8669 Personal history of other diseases of the nervous system and sense organs: Secondary | ICD-10-CM | POA: Diagnosis present

## 2024-06-27 DIAGNOSIS — I10 Essential (primary) hypertension: Secondary | ICD-10-CM | POA: Diagnosis present

## 2024-06-27 DIAGNOSIS — E538 Deficiency of other specified B group vitamins: Secondary | ICD-10-CM | POA: Diagnosis present

## 2024-06-27 DIAGNOSIS — S322XXD Fracture of coccyx, subsequent encounter for fracture with routine healing: Secondary | ICD-10-CM | POA: Diagnosis present

## 2024-06-27 DIAGNOSIS — M47816 Spondylosis without myelopathy or radiculopathy, lumbar region: Secondary | ICD-10-CM | POA: Diagnosis present

## 2024-06-27 DIAGNOSIS — G8929 Other chronic pain: Secondary | ICD-10-CM | POA: Insufficient documentation

## 2024-06-27 DIAGNOSIS — M797 Fibromyalgia: Secondary | ICD-10-CM | POA: Insufficient documentation

## 2024-06-27 DIAGNOSIS — M5030 Other cervical disc degeneration, unspecified cervical region: Secondary | ICD-10-CM | POA: Diagnosis present

## 2024-06-27 DIAGNOSIS — Z853 Personal history of malignant neoplasm of breast: Secondary | ICD-10-CM | POA: Insufficient documentation

## 2024-06-27 DIAGNOSIS — M25511 Pain in right shoulder: Secondary | ICD-10-CM | POA: Insufficient documentation

## 2024-06-27 DIAGNOSIS — M25462 Effusion, left knee: Secondary | ICD-10-CM | POA: Insufficient documentation

## 2024-06-27 DIAGNOSIS — Z8619 Personal history of other infectious and parasitic diseases: Secondary | ICD-10-CM | POA: Diagnosis present

## 2024-06-27 DIAGNOSIS — M19072 Primary osteoarthritis, left ankle and foot: Secondary | ICD-10-CM | POA: Insufficient documentation

## 2024-06-27 DIAGNOSIS — M19071 Primary osteoarthritis, right ankle and foot: Secondary | ICD-10-CM | POA: Diagnosis present

## 2024-06-27 DIAGNOSIS — M359 Systemic involvement of connective tissue, unspecified: Secondary | ICD-10-CM | POA: Diagnosis present

## 2024-06-27 DIAGNOSIS — E559 Vitamin D deficiency, unspecified: Secondary | ICD-10-CM | POA: Insufficient documentation

## 2024-06-27 DIAGNOSIS — R5383 Other fatigue: Secondary | ICD-10-CM | POA: Diagnosis present

## 2024-06-27 NOTE — Patient Instructions (Signed)
 Leflunomide Tablets What is this medication? LEFLUNOMIDE (le FLOO na mide) treats the symptoms of rheumatoid arthritis. It works by slowing down an overactive immune system. This decreases inflammation. It belongs to a group of medications called DMARDs. This medicine may be used for other purposes; ask your health care provider or pharmacist if you have questions. COMMON BRAND NAME(S): Arava What should I tell my care team before I take this medication? They need to know if you have any of these conditions: Cancer Diabetes High blood pressure Immune system problems Infection Kidney disease Liver disease Low blood cell levels (white cells, red cells, and platelets) Lung or breathing disease, such as asthma or COPD Recent or upcoming vaccine Skin conditions Tingling of the fingers or toes, or other nerve disorder An unusual or allergic reaction to leflunomide, other medications, food, dyes, or preservatives Pregnant or trying to get pregnant Breastfeeding How should I use this medication? Take this medication by mouth with a full glass of water. Take it as directed on the prescription label at the same time every day. Keep taking it unless your care team tells you to stop. Talk to your care team about the use of this medication in children. Special care may be needed. Overdosage: If you think you have taken too much of this medicine contact a poison control center or emergency room at once. NOTE: This medicine is only for you. Do not share this medicine with others. What if I miss a dose? If you miss a dose, take it as soon as you can. If it is almost time for your next dose, take only that dose. Do not take double or extra doses. What may interact with this medication? Do not take this medication with any of the following: Teriflunomide This medication may also interact with the following: Alosetron Caffeine Cefaclor Certain medications for diabetes, such as nateglinide,  repaglinide, rosiglitazone, pioglitazone Certain medications for high cholesterol, such as atorvastatin, pravastatin, rosuvastatin , simvastatin Charcoal Cholestyramine Ciprofloxacin Duloxetine Estrogen and progestin hormones Furosemide Ketoprofen Live virus vaccines Medications that increase your risk for infection Methotrexate Mitoxantrone Paclitaxel Penicillin Theophylline Tizanidine Warfarin This list may not describe all possible interactions. Give your health care provider a list of all the medicines, herbs, non-prescription drugs, or dietary supplements you use. Also tell them if you smoke, drink alcohol, or use illegal drugs. Some items may interact with your medicine. What should I watch for while using this medication? Visit your care team for regular checks on your progress. Tell your care team if your symptoms do not start to get better or if they get worse. You may need blood work done while you are taking this medication. This medication may cause serious skin reactions. They can happen weeks to months after starting the medication. Contact your care team right away if you notice fevers or flu-like symptoms with a rash. The rash may be red or purple and then turn into blisters or peeling of the skin. You may also notice a red rash with swelling of the face, lips, or lymph nodes in your neck or under your arms. You should not receive certain vaccines during your treatment and for a certain time after your treatment with this medication ends. Talk to your care team for more information. This medication may stay in your body for up to 2 years after your last dose. Tell your care team about any unusual side effects or symptoms. A medication can be given to help lower your blood levels of this  medication more quickly. Talk to your care team if you may be pregnant. This medication can cause serious birth defects if taken during pregnancy and for a while after the last dose. You will  need a negative pregnancy test before starting this medication. Contraception is recommended while taking this medication and for a while after the last dose. Your care team can help you find the option that works for you. Do not breastfeed while taking this medication. What side effects may I notice from receiving this medication? Side effects that you should report to your care team as soon as possible: Allergic reactions--skin rash, itching, hives, swelling of the face, lips, tongue, or throat Dry cough, shortness of breath or trouble breathing Increase in blood pressure Infection--fever, chills, cough, sore throat, wounds that don't heal, pain or trouble when passing urine, general feeling of discomfort or being unwell Redness, blistering, peeling, or loosening of the skin, including inside the mouth Liver injury--right upper belly pain, loss of appetite, nausea, light-colored stool, dark yellow or brown urine, yellowing skin or eyes, unusual weakness or fatigue Pain, tingling, or numbness in the hands or feet Unusual bruising or bleeding Side effects that usually do not require medical attention (report to your care team if they continue or are bothersome): Back pain Diarrhea Hair loss Headache Nausea This list may not describe all possible side effects. Call your doctor for medical advice about side effects. You may report side effects to FDA at 1-800-FDA-1088. Where should I keep my medication? Keep out of the reach of children and pets. Store at room temperature between 20 and 25 degrees C (68 and 77 degrees F). Protect from moisture and light. Keep the container tightly closed. Get rid of any unused medication after the expiration date. To get rid of medications that are no longer needed or have expired: Take the medication to a medication take-back program. Check with your pharmacy or law enforcement to find a location. If you cannot return the medication, ask your pharmacist or  care team how to get rid of this medication safely. NOTE: This sheet is a summary. It may not cover all possible information. If you have questions about this medicine, talk to your doctor, pharmacist, or health care provider.  2024 Elsevier/Gold Standard (2022-05-05 00:00:00)  Standing Labs We placed an order today for your standing lab work.   Please have your standing labs drawn in 2 weeks, 2 months and then every 3 months after starting leflunomide  Please have your labs drawn 2 weeks prior to your appointment so that the provider can discuss your lab results at your appointment, if possible.  Please note that you may see your imaging and lab results in MyChart before we have reviewed them. We will contact you once all results are reviewed. Please allow our office up to 72 hours to thoroughly review all of the results before contacting the office for clarification of your results.  WALK-IN LAB HOURS  Monday through Thursday from 8:00 am -12:30 pm and 1:00 pm-4:30 pm and Friday from 8:00 am-12:00 pm.  Patients with office visits requiring labs will be seen before walk-in labs.  You may encounter longer than normal wait times. Please allow additional time. Wait times may be shorter on  Monday and Thursday afternoons.  We do not book appointments for walk-in labs. We appreciate your patience and understanding with our staff.   Labs are drawn by [facility]. Please bring your co-pay at the time of your lab draw.  You may receive a bill from [facility] for your lab work.  Please note if you are on Hydroxychloroquine  and and an order has been placed for a Hydroxychloroquine  level,  you will need to have it drawn 4 hours or more after your last dose.  If you wish to have your labs drawn at another location, please call the office 24 hours in advance so we can fax the orders.  The office is located at [address], [place], [place] [address]   If you have any questions regarding  directions or hours of operation,  please call [phone].   As a reminder, please drink plenty of water prior to coming for your lab work. Thanks!   Vaccines You are taking a medication(s) that can suppress your immune system.  The following immunizations are recommended: Flu annually Covid-19  Td/Tdap (tetanus, diphtheria, pertussis) every 10 years Pneumonia (Prevnar 15 then Pneumovax 23 at least 1 year apart.  Alternatively, can take Prevnar 20 without needing additional dose) Shingrix: 2 doses from 4 weeks to 6 months apart  Please check with your PCP to make sure you are up to date.   If you have signs or symptoms of an infection or start antibiotics: First, call your PCP for workup of your infection. Hold your medication through the infection, until you complete your antibiotics, and until symptoms resolve if you take the following: Injectable medication (Actemra, Benlysta, Cimzia, Cosentyx, Enbrel, Humira, Kevzara, Orencia, Remicade, Simponi, Stelara, Taltz, Tremfya) Methotrexate Leflunomide (Arava) Mycophenolate (Cellcept) Earma, Olumiant, or Rinvoq

## 2024-06-27 NOTE — Telephone Encounter (Signed)
 Per Dr. Dolphus, patient starting Arava pending lab results.   Per office note: Will start her on leflunomide 10 mg p.o. daily after the labs are available.   Consent sent to scan.

## 2024-07-01 ENCOUNTER — Ambulatory Visit (INDEPENDENT_AMBULATORY_CARE_PROVIDER_SITE_OTHER): Payer: Self-pay | Admitting: Physical Therapy

## 2024-07-01 ENCOUNTER — Ambulatory Visit: Payer: Self-pay | Admitting: Rheumatology

## 2024-07-01 ENCOUNTER — Encounter: Payer: Self-pay | Admitting: Physical Therapy

## 2024-07-01 ENCOUNTER — Other Ambulatory Visit: Payer: Self-pay

## 2024-07-01 DIAGNOSIS — R2689 Other abnormalities of gait and mobility: Secondary | ICD-10-CM

## 2024-07-01 DIAGNOSIS — M25571 Pain in right ankle and joints of right foot: Secondary | ICD-10-CM | POA: Diagnosis not present

## 2024-07-01 DIAGNOSIS — M25572 Pain in left ankle and joints of left foot: Secondary | ICD-10-CM

## 2024-07-01 DIAGNOSIS — M6281 Muscle weakness (generalized): Secondary | ICD-10-CM

## 2024-07-01 LAB — CBC WITH DIFFERENTIAL/PLATELET
Absolute Lymphocytes: 1100 {cells}/uL (ref 850–3900)
Absolute Monocytes: 473 {cells}/uL (ref 200–950)
Basophils Absolute: 50 {cells}/uL (ref 0–200)
Basophils Relative: 0.9 %
Eosinophils Absolute: 132 {cells}/uL (ref 15–500)
Eosinophils Relative: 2.4 %
HCT: 36.7 % (ref 35.0–45.0)
Hemoglobin: 12.2 g/dL (ref 11.7–15.5)
MCH: 31.1 pg (ref 27.0–33.0)
MCHC: 33.2 g/dL (ref 32.0–36.0)
MCV: 93.6 fL (ref 80.0–100.0)
MPV: 9.8 fL (ref 7.5–12.5)
Monocytes Relative: 8.6 %
Neutro Abs: 3746 {cells}/uL (ref 1500–7800)
Neutrophils Relative %: 68.1 %
Platelets: 280 Thousand/uL (ref 140–400)
RBC: 3.92 Million/uL (ref 3.80–5.10)
RDW: 12.3 % (ref 11.0–15.0)
Total Lymphocyte: 20 %
WBC: 5.5 Thousand/uL (ref 3.8–10.8)

## 2024-07-01 LAB — COMPREHENSIVE METABOLIC PANEL WITH GFR
AG Ratio: 2.1 (calc) (ref 1.0–2.5)
ALT: 24 U/L (ref 6–29)
AST: 20 U/L (ref 10–35)
Albumin: 4.6 g/dL (ref 3.6–5.1)
Alkaline phosphatase (APISO): 69 U/L (ref 37–153)
BUN/Creatinine Ratio: 17 (calc) (ref 6–22)
BUN: 23 mg/dL (ref 7–25)
CO2: 28 mmol/L (ref 20–32)
Calcium: 9.4 mg/dL (ref 8.6–10.4)
Chloride: 105 mmol/L (ref 98–110)
Creat: 1.35 mg/dL — ABNORMAL HIGH (ref 0.60–1.00)
Globulin: 2.2 g/dL (ref 1.9–3.7)
Glucose, Bld: 109 mg/dL — ABNORMAL HIGH (ref 65–99)
Potassium: 5.3 mmol/L (ref 3.5–5.3)
Sodium: 140 mmol/L (ref 135–146)
Total Bilirubin: 0.3 mg/dL (ref 0.2–1.2)
Total Protein: 6.8 g/dL (ref 6.1–8.1)
eGFR: 40 mL/min/1.73m2 — ABNORMAL LOW (ref >=60)

## 2024-07-01 LAB — PROTEIN / CREATININE RATIO, URINE
Creatinine, Urine: 137 mg/dL (ref 20–275)
Protein/Creat Ratio: 204 mg/g{creat} — ABNORMAL HIGH (ref 24–184)
Protein/Creatinine Ratio: 0.204 mg/mg{creat} — ABNORMAL HIGH (ref 0.024–0.184)
Total Protein, Urine: 28 mg/dL — ABNORMAL HIGH (ref 5–24)

## 2024-07-01 LAB — QUANTIFERON-TB GOLD PLUS
Mitogen-NIL: 9.65 [IU]/mL
NIL: 0.01 [IU]/mL
QuantiFERON-TB Gold Plus: NEGATIVE
TB1-NIL: 0 [IU]/mL
TB2-NIL: 0 [IU]/mL

## 2024-07-01 LAB — ANTI-NUCLEAR AB-TITER (ANA TITER): ANA Titer 1: 1:320 {titer} — ABNORMAL HIGH

## 2024-07-01 LAB — C3 AND C4
C3 Complement: 143 mg/dL (ref 83–193)
C4 Complement: 31 mg/dL (ref 15–57)

## 2024-07-01 LAB — RHEUMATOID FACTOR: Rheumatoid fact SerPl-aCnc: <10 [IU]/mL (ref <14)

## 2024-07-01 LAB — SEDIMENTATION RATE: Sed Rate: 9 mm/h (ref 0–30)

## 2024-07-01 LAB — ANTI-DNA ANTIBODY, DOUBLE-STRANDED: ds DNA Ab: <1 [IU]/mL

## 2024-07-01 LAB — ANA: Anti Nuclear Antibody (ANA): POSITIVE — AB

## 2024-07-01 LAB — CYCLIC CITRUL PEPTIDE ANTIBODY, IGG: Cyclic Citrullin Peptide Ab: <16 U

## 2024-07-01 MED ORDER — LEFLUNOMIDE 10 MG PO TABS
10.0000 mg | ORAL_TABLET | Freq: Every day | ORAL | 0 refills | Status: DC
Start: 2024-07-01 — End: 2024-07-31

## 2024-07-01 NOTE — Addendum Note (Signed)
 Addended by: CENA ALFONSO CROME on: 07/01/2024 11:54 AM   Modules accepted: Orders

## 2024-07-01 NOTE — Therapy (Addendum)
 " OUTPATIENT PHYSICAL THERAPY TREATMENT  DISCHARGE   Patient Name: [name] MRN: [id number] DOB:Jul 29, 1946, 78 y.o., female Today's Date: 07/01/2024   END OF SESSION:  PT End of Session - 07/01/24 1436     Visit Number 2    Number of Visits 9    Date for PT Re-Evaluation 08/21/24    Authorization Type MCR    Progress Note Due on Visit 10    PT Start Time 1346    PT Stop Time 1424    PT Time Calculation (min) 38 min    Activity Tolerance Patient tolerated treatment well    Behavior During Therapy WFL for tasks assessed/performed           Past Medical History:  Diagnosis Date   Arthritis    Breast cancer (HCC)    R lumpectomy ; radiation; no chemotherapy   Bronchitis    recurrent   Bulging lumbar disc    Cataract    multifocal lens implant   Chronic low back pain    bulging disc & spinal stenosis   Coccygeal fracture (HCC) 05/15/2021   COVID-19    COVID-19 02/2023   Cystitis    Exertional dyspnea 06/11/2024   Fibromyalgia    Hyperlipemia    Mitral valve prolapse    Osteoporosis    Personal history of radiation therapy    Pneumonia    as OP X 2; no Pneumovax   Ruptured disc, thoracic    Stage 3 chronic kidney disease (HCC)    Tendinitis of both ankles 05/29/2024   Tendinitis of both feet 05/29/2024   Vitamin D  deficiency    18 in  01/2009   Past Surgical History:  Procedure Laterality Date   ABDOMINAL HYSTERECTOMY  1990   [facility] for fibroid , Dr Freya   BREAST LUMPECTOMY Right    BREAST SURGERY  2010   R side lumpectomy; Dr Merrilyn   CATARACT EXTRACTION, BILATERAL  2017   CESAREAN SECTION      G 2 P 2   CYSTECTOMY  2010   EYE SURGERY     eye lid surgery   FOOT SURGERY Right    no colonoscopy     I just don't want to do it. 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   Other chest pain 03/10/2020   Shingles 11/19/2019   Pain in both knees 07/09/2019   Right arm pain 02/26/2019   Acute pain of left knee 08/27/2018   Hand arthritis 06/13/2018   Trigger middle finger of right hand 06/13/2018   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   Intermittent palpitations 10/11/2017   Migraine 12/30/2015   Upper airway cough syndrome 10/29/2015   Osteoporosis 06/06/2011   Vitamin D  deficiency 03/22/2010   BREAST CANCER, HX OF 03/22/2010   HYPERLIPIDEMIA 09/28/2007   Fibromyalgia 05/24/2007    PCP: Geofm Glade PARAS, MD  REFERRING PROVIDER: Leonce Katz, DO  REFERRING DIAG: Bilateral  foot pain  THERAPY DIAG:  Pain in right ankle and joints of right foot  Pain in left ankle and joints of left foot  Muscle weakness (generalized)  Other abnormalities of gait and mobility  Rationale for Evaluation and Treatment: Rehabilitation  ONSET DATE: Ongoing 1-2 months   SUBJECTIVE:  SUBJECTIVE STATEMENT: Patient reports she hasn't had swelling in either foot for the past 2 days. States she still has pain at the back of the heel. She does report soreness across her foot with the exercise.   Eval: Patient reports her heels and ankle have been swelling and it has been very painful, and there is a burning in the back of her lower leg. The left is much worse. This has been going on for about a month or two, it just came on. She states she was probably doing a lot more walking at the time. She also notes her ankles are stiff and sore in the morning and the right one will work  out better than the left. She reports trouble walking and she is limited with about anything where she is on her feet, and it does hurt to move her ankles. Going down steps is very painful. She reports that lying down feels much better and the swelling in her ankles and feet will go down over night. She does feel tingling on top of the foot usually in the evening when her feet are the most swollen. She does have a history of lower back issues and the doctor thinks the pain is coming from the back, and she had an epidural ordered today. Prior to her feet hurting, she was walking a few miles per day, and now she thinks she could only walk about a 1/4 of a mile and that is painful.   PERTINENT HISTORY: See PMH above  PAIN:  Are you having pain? Yes:  NPRS scale: 6-7/10 currently Pain location: Bilateral ankles, heels, calves Pain description: Sore Aggravating factors: Walking or being on feet Relieving factors: Rest off feet, lying down  PRECAUTIONS: None  PATIENT GOALS: Pain relief, improve walking   OBJECTIVE:  Note: Objective measures were completed at Evaluation unless otherwise noted. PATIENT SURVEYS:  Not assessed at eval   SENSATION: Grossly WFL for LE  EDEMA: Patient demonstrates R > L ankle edema that is primarily located around lateral ankle and foot  MUSCLE LENGTH: Bilateral calf tightness  POSTURE:  Loss of lumbar lordosis with rounded shoulder and increase in thoracic kyphosis, minimal pes planus  PALPATION: Exquisite tenderness to bilateral posterior heel at achilles insertions, mild generalized tenderness to medial and lateral ankle region  LUMBAR ROM:  Patient with limitation in her lumbar motion, denies any change in LE symptoms with lumbar motion  LOWER EXTREMITY ROM:  Active ROM Right eval Left eval  Hip flexion    Hip extension    Hip abduction    Hip adduction    Hip internal rotation    Hip external rotation    Knee flexion    Knee extension     Ankle dorsiflexion 0 5 lacking  Ankle plantarflexion 60 60  Ankle inversion 35 40  Ankle eversion 10 5   (Blank rows = not tested)  LOWER EXTREMITY MMT:  MMT Right eval Left eval  Hip flexion    Hip extension    Hip abduction    Hip adduction    Hip internal rotation    Hip external rotation    Knee flexion  Knee extension    Ankle dorsiflexion 4+ 4+  Ankle plantarflexion 3- 3-  Ankle inversion 4 4  Ankle eversion 4 4   (Blank rows = not tested)  FUNCTIONAL TESTS:  SL heel raise: unable to perform through range bilaterally SLS: < 3 sec bilaterally Sit to stand: able to perform without UE support  GAIT: Assistive device utilized: None Level of assistance: Complete Independence Comments: Antalgic gait primarily on right                                                                                                                               TREATMENT [facility] Adult PT Treatment:                                                DATE: 07/01/2024 Longsitting ankle PF with red 3 x 10 each STM / TPR bilateral calves Standing calf stretch at counter 3 x 30 sec each  Discussed presence of heel spurs and achilles involvement  PATIENT EDUCATION:  Education details: HEP Person educated: Patient Education method: Programmer, Multimedia, Demonstration, Actor cues, Verbal cues Education comprehension: verbalized understanding, returned demonstration, verbal cues required, tactile cues required, and needs further education  HOME EXERCISE PROGRAM: Access Code: [email or web address]    ASSESSMENT: CLINICAL IMPRESSION: Patient tolerated therapy well with no adverse effects. She continues to report pain at achilles insertion at posterior heel bilaterally but swelling has resolved. She was reporting pain with banded ankle strengthening so reduced resistance with good tolerance. Performed STM/TPR for bilateral calves and she did report left calf feeling more tender than right. Incorporated calf  stretching but she did report this increased heel pain so did not add to her HEP. Patient would benefit from continued skilled PT to progress mobility and strength in order to reduce pain and maximize functional ability.   Eval: Patient is a 78 y.o. 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 She does report an increase in walking activity that could have contributed to development of her pain. She exhibits limitations with her ankle motion and strength with exquisite tenderness at achilles insertion bilaterally. Trial of heel lift but patient's current shoes placed too much pressure on back of heel so encouraged patient to put them in her tennis shoes for walking. Encouraged patient to continue with referring provider's plan of epidural injection but could schedule further out to assess response to therapy focused on bilateral ankle pain.  OBJECTIVE IMPAIRMENTS: Abnormal gait, decreased activity tolerance, decreased balance, decreased ROM, decreased strength, impaired flexibility, postural dysfunction, and pain.   ACTIVITY LIMITATIONS: standing, stairs, and locomotion level  PARTICIPATION LIMITATIONS: meal prep, cleaning, shopping, and community activity  PERSONAL FACTORS: Fitness, Past/current experiences, and Time  since onset of injury/illness/exacerbation are also affecting patient's functional outcome.    GOALS: Goals reviewed with patient? Yes  SHORT TERM GOALS: Target date: 07/24/2024  Patient will be I with initial HEP in order to progress with therapy. Baseline: HEP provided at eval Goal status: INITIAL  2.  Patient will report bilateral ankle pain </= 3/10 in order to reduce functional limitations Baseline: 6-7/10 pain Goal status: INITIAL  3.  Patient will demonstrate bilateral ankle DF >/= 5 deg to improve her gait Baseline: see  limitations above Goal status: INITIAL  LONG TERM GOALS: Target date: 08/21/2024  Patient will be I with final HEP to maintain progress from PT. Baseline: HEP provided at eval Goal status: INITIAL  2.  Patient will demonstrate bilateral ankle PF strength >/= 4/5 MMT in order to improve her walking tolerance Baseline: grossly 3-/5 MMT Goal status: INITIAL  3.  Patient will report bilateral ankle pain with walking </= 1/10 in order to improve her walking and aerobic capacity Baseline: 6-7/10 pain Goal status: INITIAL  4.  Patient will demonstrate SLS >/= 10 bilaterally seconds to improve ankle control and reduce balance deficits Baseline: < 3 sec bilaterally Goal status: INITIAL   PLAN: PT FREQUENCY: 1x/week  PT DURATION: 8 weeks  PLANNED INTERVENTIONS: 97164- PT Re-evaluation, 97750- Physical Performance Testing, 97110-Therapeutic exercises, 97530- Therapeutic activity, 97112- Neuromuscular re-education, 97535- Self Care, 02859- Manual therapy, [phone]- Gait training, [phone]- Ionotophoresis 4mg /ml Dexamethasone , 79439 (1-2 muscles), 20561 (3+ muscles)- Dry Needling, Patient/Family education, Balance training, Stair training, Taping, Joint mobilization, Joint manipulation, Spinal manipulation, Spinal mobilization, Cryotherapy, and Moist heat  PLAN FOR NEXT SESSION: Review HEP and progress PRN, manual for calf and ankle mobility, progress ankle strengthening, gait training, balance training, address lumbar deficits as needed    Elaine Daring, PT, DPT, LAT, ATC 07/01/24  2:41 PM Phone: [phone] Fax: [phone]   PHYSICAL THERAPY DISCHARGE SUMMARY  Visits from Start of Care: 2  Current functional level related to goals / functional outcomes: See above   Remaining deficits: See above   Education / Equipment: HEP   Patient agrees to discharge. Patient goals were not met. Patient is being discharged due to not returning since the last visit.  Elaine Daring, PT, DPT,  LAT, ATC 01/01/2025  10:21 AM Phone: [phone] Fax: [phone]   "

## 2024-07-01 NOTE — Telephone Encounter (Signed)
 Urine protein creatinine ratio is mildly elevated.  ANA is positive at 1: 320 which is a stable, double-stranded DNA negative, complements normal, CBC normal, rheumatoid factor negative, anti-CCP negative, TB Gold negative.Creatinine is elevated, higher than before.  Patient may start leflunomide  10 mg p.o. daily as discussed.  Recheck labs in 2 weeks, 2 months and then every 3 months.

## 2024-07-01 NOTE — Addendum Note (Signed)
 Addended by: CHERYL WADDELL HERO on: 07/01/2024 12:48 PM   Modules accepted: Orders

## 2024-07-01 NOTE — Progress Notes (Signed)
 Urine protein creatinine ratio is mildly elevated.  ANA is positive at 1: 320 which is a stable, double-stranded DNA negative, complements normal, CBC normal, rheumatoid factor negative, anti-CCP negative, TB Gold negative.Creatinine is elevated, higher than before.  Patient may start leflunomide  10 mg p.o. daily as discussed.  Recheck labs in 2 weeks, 2 months and then every 3 months.  Please forward results to her PCP.  Please refer patient to nephrology for the evaluation of elevated creatinine and proteinuria.

## 2024-07-02 ENCOUNTER — Telehealth: Payer: Self-pay | Admitting: *Deleted

## 2024-07-02 NOTE — Telephone Encounter (Signed)
 Patient contacted the office and left message requesting a call back regarding the Arava .   Returned call to patient. Patient states she was just now able to pick up the prescription of Arava . Patient states now after 3 weeks the swelling in her feet and ankles is gone. Patient states the pain is better. Patient states the pain in her hands is better too. Patient would like to know if you would like for her to still start the Arava  or wait. Please advise.

## 2024-07-02 NOTE — Telephone Encounter (Signed)
 Patient advised she had significant swelling at the last visit.  If she wants to hold off Arava  she may. Patient expressed understanding.

## 2024-07-02 NOTE — Telephone Encounter (Signed)
 Patient had significant swelling at the last visit.  If she wants to hold off Arava  she may.

## 2024-07-15 ENCOUNTER — Ambulatory Visit (HOSPITAL_BASED_OUTPATIENT_CLINIC_OR_DEPARTMENT_OTHER)

## 2024-07-15 DIAGNOSIS — R0609 Other forms of dyspnea: Secondary | ICD-10-CM | POA: Diagnosis not present

## 2024-07-15 LAB — ECHOCARDIOGRAM COMPLETE
AR max vel: 1.98 cm2
AV Area VTI: 1.95 cm2
AV Area mean vel: 1.97 cm2
AV Mean grad: 3 mmHg
AV Peak grad: 7.4 mmHg
AV Vena cont: 0.23 cm
Ao pk vel: 1.36 m/s
Area-P 1/2: 4.15 cm2
S' Lateral: 2.06 cm

## 2024-07-18 ENCOUNTER — Encounter: Admitting: Physical Therapy

## 2024-07-18 ENCOUNTER — Telehealth (HOSPITAL_BASED_OUTPATIENT_CLINIC_OR_DEPARTMENT_OTHER): Payer: Self-pay | Admitting: Cardiovascular Disease

## 2024-07-18 NOTE — Telephone Encounter (Signed)
 Patient called to follow-up on her echocardiogram test results.

## 2024-07-18 NOTE — Telephone Encounter (Signed)
 Pt calling into the office to request her echo results from 7/28.  Pt aware that Dr. Raford still needs to review and result her echo, and once she does we will call her shortly thereafter.   Pt request a call back with the echo results vs sending through to her mychart account.   Pt aware we will call her with those results once Dr. Raford has a chance to review them.   Pt verbalized understanding and agrees with this plan.

## 2024-07-22 ENCOUNTER — Telehealth: Payer: Self-pay

## 2024-07-22 NOTE — Telephone Encounter (Signed)
 Copied from CRM [id number]. Topic: Appointments - Appointment Scheduling >> Jul 22, 2024  3:20 PM Shereese L wrote: Patient/patient representative is calling to schedule an appointment. Patient wanted to go over labs and needed a nurse to call prior to appt

## 2024-07-23 NOTE — Telephone Encounter (Signed)
 Spoke with patient today.   When she saw Dr. Dolphus last month her kidney function and kidney function was abnormal and she would like to discuss with Dr. Geofm

## 2024-07-24 ENCOUNTER — Ambulatory Visit: Payer: Self-pay | Admitting: Cardiovascular Disease

## 2024-07-25 ENCOUNTER — Encounter: Admitting: Physical Therapy

## 2024-07-29 NOTE — Progress Notes (Signed)
 Subjective:    Patient ID: [name], female    DOB: [birth year], 78 y.o.   MRN: [id number]     HPI [name] is here for follow up of her chronic medical problems.  A couple of times a week - low BP with SBP 103-106-when this occurs she does feel extremely fatigued and has difficulty functioning.  Blood pressure typically increases by lunchtime and she feels better.  Recent readings included 1 elevated SBP of 151.  Wanted to review recent blood work  Medications and allergies reviewed with patient and updated if appropriate.  Current Outpatient Medications on File Prior to Visit  Medication Sig Dispense Refill   acetaminophen (TYLENOL) 500 MG tablet Take 1,000 mg by mouth daily as needed for mild pain.     amLODipine  (NORVASC ) 5 MG tablet TAKE 1 TABLET(5 MG) BY MOUTH DAILY 90 tablet 1   CALCIUM  PO Take by mouth daily.     cholecalciferol (VITAMIN D3) 25 MCG (1000 UNIT) tablet Take 1,000 Units by mouth daily.     CRANBERRY PO Take by mouth.     Cyanocobalamin  (B-12 PO) Take by mouth.     eletriptan  (RELPAX ) 20 MG tablet TAKE 1 TABLET BY MOUTH EVERY 2 HOURS AS NEEDED FOR HEADACHE OR MIGRAINE. MAY REPEAT IN 2 HOURS IF HEADACHE PERSISTS OR RECURS. 9 tablet 1   hydroxychloroquine  (PLAQUENIL ) 200 MG tablet TAKE 1 TABLET BY MOUTH TWICE DAILY, MONDAY THROUGH FRIDAY 120 tablet 0   leflunomide  (ARAVA ) 10 MG tablet Take 1 tablet (10 mg total) by mouth daily. 30 tablet 0   losartan  (COZAAR ) 50 MG tablet 50mg  at 1/2 tablet every morning and 1 tablet every evening. 135 tablet 3   Probiotic Product (PROBIOTIC DAILY PO) Take by mouth.     RESTASIS 0.05 % ophthalmic emulsion SMARTSIG:1 Drop(s) In Eye(s) Every 12 Hours     traMADol  (ULTRAM ) 50 MG tablet Take 1 tablet (50 mg total) by mouth every 12 (twelve) hours as needed. 60 tablet 0   traZODone  (DESYREL ) 50 MG tablet TAKE 3 TABLETS(150 MG) BY MOUTH AT BEDTIME 270 tablet 1   No current facility-administered medications on file prior to  visit.     Review of Systems     Objective:   Vitals:   07/30/24 1048  BP: 106/78  Pulse: 60  Temp: 98 F (36.7 C)  SpO2: 98%   BP Readings from Last 3 Encounters:  07/30/24 106/78  06/27/24 94/63  06/11/24 117/76   Wt Readings from Last 3 Encounters:  06/26/24 138 lb (62.6 kg)  06/11/24 137 lb (62.1 kg)  03/25/24 139 lb (63 kg)   Body mass index is 23.32 kg/m.    Physical Exam Constitutional:      General: She is not in acute distress.    Appearance: Normal appearance. She is not ill-appearing.  HENT:     Head: Normocephalic and atraumatic.  Skin:    General: Skin is warm and dry.  Neurological:     Mental Status: She is alert. Mental status is at baseline.  Psychiatric:        Mood and Affect: Mood normal.        Behavior: Behavior normal.        Thought Content: Thought content normal.        Judgment: Judgment normal.        Lab Results  Component Value Date   WBC 5.5 06/27/2024   HGB 12.2 06/27/2024   HCT 36.7  06/27/2024   PLT 280 06/27/2024   GLUCOSE 109 (H) 06/27/2024   CHOL 225 (H) 03/10/2023   TRIG 110.0 03/10/2023   HDL 76.90 03/10/2023   LDLDIRECT 122.6 06/03/2013   LDLCALC 126 (H) 03/10/2023   ALT 24 06/27/2024   AST 20 06/27/2024   NA 140 06/27/2024   K 5.3 06/27/2024   CL 105 06/27/2024   CREATININE 1.35 (H) 06/27/2024   BUN 23 06/27/2024   CO2 28 06/27/2024   TSH 2.29 03/10/2023   HGBA1C 5.2 03/06/2020     Assessment & Plan:    See Problem List for Assessment and Plan of chronic medical problems.

## 2024-07-30 ENCOUNTER — Ambulatory Visit (INDEPENDENT_AMBULATORY_CARE_PROVIDER_SITE_OTHER): Admitting: Internal Medicine

## 2024-07-30 ENCOUNTER — Encounter: Payer: Self-pay | Admitting: Internal Medicine

## 2024-07-30 ENCOUNTER — Other Ambulatory Visit: Payer: Self-pay | Admitting: Physician Assistant

## 2024-07-30 VITALS — BP 106/78 | HR 60 | Temp 98.0°F | Ht 64.5 in

## 2024-07-30 DIAGNOSIS — N1832 Chronic kidney disease, stage 3b: Secondary | ICD-10-CM | POA: Diagnosis not present

## 2024-07-30 DIAGNOSIS — N183 Chronic kidney disease, stage 3 unspecified: Secondary | ICD-10-CM | POA: Insufficient documentation

## 2024-07-30 DIAGNOSIS — I10 Essential (primary) hypertension: Secondary | ICD-10-CM

## 2024-07-30 DIAGNOSIS — R739 Hyperglycemia, unspecified: Secondary | ICD-10-CM | POA: Insufficient documentation

## 2024-07-30 NOTE — Assessment & Plan Note (Signed)
 Chronic Blood pressure on the low side here today and while at home it is variable-sometimes low and sometimes high When SBP is low 100s she is symptomatic-feels very fatigued and has difficulty functioning, occasional SBP low 150s Currently taking  amlodipine  5 mg daily, losartan  25 mg a.m., 50 mg p.m. She can try taking half of the losartan  in the evening in addition to her morning to see if that helps decrease some of those lower blood pressure readings in the morning Monitor BP carefully-try to keep as good control as possible given CKD

## 2024-07-30 NOTE — Assessment & Plan Note (Signed)
 Has had a few recent elevated glucose readings, but they were not fasting so difficult to say what her sugars are Discussed possibly A1c-deferred for today, but we can check this at her next visit Brother has diabetes Eats healthy overall-discussed low sugar/carb diet

## 2024-07-30 NOTE — Patient Instructions (Signed)
        Medications changes include :   try 1/2 losartan  at night

## 2024-07-30 NOTE — Telephone Encounter (Signed)
 Last Fill: 07/01/2024  Labs: 06/27/2024 Urine protein creatinine ratio is mildly elevated. ANA is positive at 1: 320 which is a stable, double-stranded DNA negative, complements normal, CBC normal, rheumatoid factor negative, anti-CCP negative, TB Gold negative.Creatinine is elevated, higher than before. Patient may start leflunomide  10 mg p.o. daily as discussed. Recheck labs in 2 weeks, 2 months and then every 3 months. Please forward results to her PCP. Please refer patient to nephrology for the evaluation of elevated creatinine and proteinuria.   Next Visit: 09/02/2024  Last Visit: 06/27/2024  DX: Autoimmune disease   Current Dose per office note 06/27/2024: Dose not discussed.   Okay to refill Arava  ?

## 2024-07-30 NOTE — Assessment & Plan Note (Signed)
 Chronic Kidney function variable Recent GFR slightly lower, but similar to what it was last year at 1 point Does have some proteinuria Has seen nephrology 2 years ago and was advised at that time no follow-up was needed To have kidney function done this week-if GFR is still on the low side and given proteinuria would recommend she sees nephrology again to assess if medication changes are necessary Continue losartan  daily

## 2024-08-01 ENCOUNTER — Encounter: Admitting: Physical Therapy

## 2024-08-01 ENCOUNTER — Other Ambulatory Visit: Payer: Self-pay | Admitting: *Deleted

## 2024-08-01 DIAGNOSIS — Z79899 Other long term (current) drug therapy: Secondary | ICD-10-CM

## 2024-08-01 LAB — COMPREHENSIVE METABOLIC PANEL WITH GFR
AG Ratio: 2.1 (calc) (ref 1.0–2.5)
ALT: 15 U/L (ref 6–29)
AST: 18 U/L (ref 10–35)
Albumin: 4.4 g/dL (ref 3.6–5.1)
Alkaline phosphatase (APISO): 63 U/L (ref 37–153)
BUN/Creatinine Ratio: 16 (calc) (ref 6–22)
BUN: 17 mg/dL (ref 7–25)
CO2: 24 mmol/L (ref 20–32)
Calcium: 9.4 mg/dL (ref 8.6–10.4)
Chloride: 107 mmol/L (ref 98–110)
Creat: 1.09 mg/dL — ABNORMAL HIGH (ref 0.60–1.00)
Globulin: 2.1 g/dL (ref 1.9–3.7)
Glucose, Bld: 87 mg/dL (ref 65–99)
Potassium: 5.1 mmol/L (ref 3.5–5.3)
Sodium: 139 mmol/L (ref 135–146)
Total Bilirubin: 0.4 mg/dL (ref 0.2–1.2)
Total Protein: 6.5 g/dL (ref 6.1–8.1)
eGFR: 52 mL/min/1.73m2 — ABNORMAL LOW (ref >=60)

## 2024-08-01 LAB — CBC WITH DIFFERENTIAL/PLATELET
Absolute Lymphocytes: 1173 {cells}/uL (ref 850–3900)
Absolute Monocytes: 361 {cells}/uL (ref 200–950)
Basophils Absolute: 41 {cells}/uL (ref 0–200)
Basophils Relative: 1 %
Eosinophils Absolute: 111 {cells}/uL (ref 15–500)
Eosinophils Relative: 2.7 %
HCT: 36.6 % (ref 35.0–45.0)
Hemoglobin: 12 g/dL (ref 11.7–15.5)
MCH: 30.8 pg (ref 27.0–33.0)
MCHC: 32.8 g/dL (ref 32.0–36.0)
MCV: 93.8 fL (ref 80.0–100.0)
MPV: 10.2 fL (ref 7.5–12.5)
Monocytes Relative: 8.8 %
Neutro Abs: 2415 {cells}/uL (ref 1500–7800)
Neutrophils Relative %: 58.9 %
Platelets: 238 Thousand/uL (ref 140–400)
RBC: 3.9 Million/uL (ref 3.80–5.10)
RDW: 12.5 % (ref 11.0–15.0)
Total Lymphocyte: 28.6 %
WBC: 4.1 Thousand/uL (ref 3.8–10.8)

## 2024-08-02 ENCOUNTER — Ambulatory Visit: Payer: Self-pay | Admitting: Physician Assistant

## 2024-08-02 NOTE — Progress Notes (Signed)
 CBC WNL  Creatinine remains slightly elevated but has improved-1.09 and GFR has improved from 40 to 52.  Rest of CMP WNL.

## 2024-08-09 DIAGNOSIS — M7661 Achilles tendinitis, right leg: Secondary | ICD-10-CM | POA: Insufficient documentation

## 2024-08-12 ENCOUNTER — Encounter: Payer: Self-pay | Admitting: Internal Medicine

## 2024-08-20 NOTE — Progress Notes (Signed)
 Office Visit Note  Patient: [name]             Date of Birth: [birth year]           MRN: [id number]             PCP: Geofm Glade PARAS, MD Referring: Geofm Glade PARAS, MD Visit Date: 09/02/2024 Occupation: @GUAROCC @  Subjective:  Medication monitoring  History of Present Illness: WANETA FITTING is a 78 y.o. female with history of autoimmune disease and osteoarthritis.  She remains on plaquenil  200 mg 1 tablet by mouth twice daily Monday through Friday. She is taking arava  10 mg 1 tablet by mouth daily which was started in early August 2025.  She has been tolerating Arava  without any side effects and has not had any gaps in therapy.  She has not yet noticed any clinical benefit.  She continues to have arthralgias and joint stiffness especially in the right hand, left knee, and right foot.  Patient has difficulty walking prolonged distances or at an incline due to the discomfort in her feet. She continues to have chronic sicca symptoms.  She denies any oral or nasal ulcerations.  She has not had any recent rashes.  She continues to have ongoing hair thinning.  Activities of Daily Living:  Patient reports morning stiffness for 2 hours.   Patient Denies nocturnal pain.  Difficulty dressing/grooming: Reports Difficulty climbing stairs: Reports Difficulty getting out of chair: Denies Difficulty using hands for taps, buttons, cutlery, and/or writing: Reports  Review of Systems  Constitutional:  Positive for fatigue.  HENT:  Positive for mouth dryness. Negative for mouth sores.   Eyes:  Positive for dryness.  Respiratory:  Negative for shortness of breath.   Cardiovascular:  Negative for chest pain and palpitations.  Gastrointestinal:  Negative for blood in stool, constipation and diarrhea.  Endocrine: Negative for increased urination.  Genitourinary:  Negative for involuntary urination.  Musculoskeletal:  Positive for joint pain, joint pain, joint swelling, myalgias, muscle  weakness, morning stiffness, muscle tenderness and myalgias. Negative for gait problem.  Skin:  Positive for hair loss. Negative for color change, rash and sensitivity to sunlight.  Allergic/Immunologic: Negative for susceptible to infections.  Neurological:  Negative for dizziness and headaches.  Hematological:  Negative for swollen glands.  Psychiatric/Behavioral:  Negative for depressed mood and sleep disturbance. The patient is not nervous/anxious.     PMFS History:  Patient Active Problem List   Diagnosis Date Noted   Achilles tendinitis of both lower extremities 08/09/2024   Hyperglycemia 07/30/2024   CKD (chronic kidney disease) stage 3, GFR 30-59 ml/min (HCC) 07/30/2024   Exertional dyspnea 06/11/2024   Heel pain, bilateral 05/28/2024   Sleep difficulties 09/10/2023   Chronic right shoulder pain 03/29/2023   Lumbar radiculopathy 09/09/2022   Agatston coronary artery calcium  score of 0,  2023 07/28/2022   Hypertension 07/05/2022   Renal lesion 04/14/2022   B12 deficiency 04/15/2021   COVID-19 04/14/2021   Autoimmune disorder (HCC) 04/13/2021   At risk for long QT syndrome 01/21/2021   Acute cystitis without hematuria 01/21/2021   Spinal stenosis, lumbar region with neurogenic claudication 08/05/2020   Other chest pain 03/10/2020   Shingles 11/19/2019   Pain in both knees 07/09/2019   Right arm pain 02/26/2019   Acute pain of left knee 08/27/2018   Hand arthritis 06/13/2018   Trigger middle finger of right hand 06/13/2018   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   Intermittent palpitations 10/11/2017   Migraine  12/30/2015   Upper airway cough syndrome 10/29/2015   Osteoporosis 06/06/2011   Vitamin D  deficiency 03/22/2010   BREAST CANCER, HX OF 03/22/2010   HYPERLIPIDEMIA 09/28/2007   Fibromyalgia 05/24/2007    Past Medical History:  Diagnosis Date   Arthritis    Breast cancer (HCC)    R lumpectomy ; radiation; no chemotherapy   Bronchitis    recurrent   Bulging  lumbar disc    Cataract    multifocal lens implant   Chronic low back pain    bulging disc & spinal stenosis   Coccygeal fracture (HCC) 05/15/2021   COVID-19    COVID-19 02/2023   Cystitis    Exertional dyspnea 06/11/2024   Fibromyalgia    Hyperlipemia    Mitral valve prolapse    Osteoporosis    Personal history of radiation therapy    Pneumonia    as OP X 2; no Pneumovax   Ruptured disc, thoracic    Stage 3 chronic kidney disease (HCC)    Tendinitis of both ankles 05/29/2024   Tendinitis of both feet 05/29/2024   Vitamin D  deficiency    19 in  01/2009    Family History  Problem Relation Age of Onset   Atrial fibrillation Mother        S/P cardioversion   Ulcers Mother    Hypertension Mother    Dementia Mother    Heart attack Father 58       died @ 36   Hypertension Brother    Other Brother        Atrial septum defect   Diabetes Brother    Atrial fibrillation Brother    Peptic Ulcer Brother    Heart attack Paternal Grandmother        < 65   Heart attack Paternal Grandfather        > 55   Thyroid  disease Daughter        on Synthroid since high school   Healthy Son    Heart attack Paternal Aunt        < 65   Heart attack Paternal Uncle        > 55   Stroke Neg Hx    Breast cancer Neg Hx    Past Surgical History:  Procedure Laterality Date   ABDOMINAL HYSTERECTOMY  1990   [facility] for fibroid , Dr Freya   BREAST LUMPECTOMY Right    BREAST SURGERY  2010   R side lumpectomy; Dr Merrilyn   CATARACT EXTRACTION, BILATERAL  2017   CESAREAN SECTION      G 2 P 2   CYSTECTOMY  2010   EYE SURGERY     eye lid surgery   FOOT SURGERY Right    no colonoscopy     I just don't want to do it. SOC reviewed   Social History   Social History Narrative   Lives with husband   Immunization History  Administered Date(s) Administered   Fluad Quad(high Dose 65+) 10/02/2019, 09/09/2022   Fluad Trivalent(High Dose 65+) 09/11/2023   PFIZER(Purple Top)SARS-COV-2 Vaccination  01/08/2020, 01/27/2020, 10/17/2020   Td 12/20/1999     Objective: Vital Signs: BP 105/69   Pulse 71   Temp 97.9 F (36.6 C)   Resp 14   Ht 5' 4.5 (1.638 m)   BMI 23.32 kg/m    Physical Exam Vitals and nursing note reviewed.  Constitutional:      Appearance: She is well-developed.  HENT:     Head:  Normocephalic and atraumatic.  Eyes:     Conjunctiva/sclera: Conjunctivae normal.  Cardiovascular:     Rate and Rhythm: Normal rate and regular rhythm.     Heart sounds: Normal heart sounds.  Pulmonary:     Effort: Pulmonary effort is normal.     Breath sounds: Normal breath sounds.  Abdominal:     General: Bowel sounds are normal.     Palpations: Abdomen is soft.  Musculoskeletal:     Cervical back: Normal range of motion.  Lymphadenopathy:     Cervical: No cervical adenopathy.  Skin:    General: Skin is warm and dry.     Capillary Refill: Capillary refill takes less than 2 seconds.  Neurological:     Mental Status: She is alert and oriented to person, place, and time.  Psychiatric:        Behavior: Behavior normal.      Musculoskeletal Exam: C-spine, thoracic spine, lumbar spine have good range of motion.  Shoulder joints, elbow joints, and wrist joints have good ROM.  Tenderness of the right 1st MCP and 2nd MCP joint.  Tenderness of both CMC joints.  Hip joints have good ROM with no groin pain.  Knee joints have good ROM with discomfort in the left knee.  Mild crepitus of the left knee.  Small effusion of the left knee. Ankle joints have good ROM. Posterior calcaneal spurs bilaterally.  Tenderness and mild inflammation of the right 1st MTP joint.    CDAI Exam: CDAI Score: -- Patient Global: --; Provider Global: -- Swollen: --; Tender: -- Joint Exam 09/02/2024   No joint exam has been documented for this visit   There is currently no information documented on the homunculus. Go to the Rheumatology activity and complete the homunculus joint exam.  Investigation: No  additional findings.  Imaging: No results found.  Recent Labs: Lab Results  Component Value Date   WBC 4.1 08/01/2024   HGB 12.0 08/01/2024   PLT 238 08/01/2024   NA 139 08/01/2024   K 5.1 08/01/2024   CL 107 08/01/2024   CO2 24 08/01/2024   GLUCOSE 87 08/01/2024   BUN 17 08/01/2024   CREATININE 1.09 (H) 08/01/2024   BILITOT 0.4 08/01/2024   ALKPHOS 65 03/10/2023   AST 18 08/01/2024   ALT 15 08/01/2024   PROT 6.5 08/01/2024   ALBUMIN 4.7 03/10/2023   CALCIUM  9.4 08/01/2024   GFRAA 58 (L) 05/24/2021   QFTBGOLDPLUS NEGATIVE 06/27/2024    Speciality Comments: PLQ Eye Exam: 05/30/2024 WNL @ [facility] Follow up in 6 months.  Procedures:  No procedures performed Allergies: Albuterol, Morphine, Augmentin [amoxicillin-pot clavulanate], Ciprofloxacin, Fish allergy, Gabapentin , Prednisone , Risedronate sodium, Cyclobenzaprine , and Ibandronate sodium   Assessment / Plan:     Visit Diagnoses: Autoimmune disease (HCC) - Positive ANA, dry eyes, hair loss, Raynaud's, inflammatory arthritis: She is currently taking Plaquenil  200 mg 1 tablet by mouth twice daily Monday through Friday and Arava  10 mg 1 tablet by mouth daily. Arava  was added as combination therapy in early August 2025.  She is tolerating Arava  without any side effects and has not had any gaps in therapy.  She has not yet noticed any clinical improvement.  She continues to have chronic pain and stiffness involving multiple joints.  She has ongoing sicca symptoms as well as fatigue. She is willing to give combination therapy more time and for us  to reassess for the full efficacy in 2 to 3 months.  Discussed the possible option of  increasing the dose of Arava  to 10 mg alternating with 20 mg every other day pending lab results.  Renal function improved when compared from 06/27/2024: Creatinine 1.35 and GFR 40 to 08/01/24: Creatinine 1.09 and GFR 52. For now she will remain on the current treatment regimen with close  monitoring.   High risk medication use - Plaquenil  200 mg 1 tablet BID Monday to Friday. Intolerance of imuran -nausea and anemia.  CBC and CMP updated on 08/01/24.  She will return for updated lab work in 1 month and every 3 months. PLQ Eye Exam: 05/30/2024 WNL @ [facility].   Chronic right shoulder pain: Intermittent discomfort.  Trigger middle finger of right hand: Not currently symptomatic.  Primary osteoarthritis of both hands: PIP and DIP thickening  Primary osteoarthritis of right hip: Good ROM with no groin pain currently.   Effusion, left knee: Patient continues to have chronic pain in the left knee.  No warmth was noted on examination today.  Synovial thickening of the left knee was noted.  Plan to continue the current treatment regimen.  She was advised to notify us  if she develops any new or worsening symptoms.  Primary osteoarthritis of both feet: Chronic pain.  Podiatry has recommended the use of Hoka's which she has not yet purchased.  DDD (degenerative disc disease), cervical - X-rays from March 29, 2023 showed degenerative changes and posterior osteophytes at C6- C7.  Evaluated by Dr. Beuford in the past. Chronic pain.    Lumbar spondylosis - Degenerative changes seen most pronounced at L4-L5 L5-S1 from MRI of the lumbar on March 2023. Manageable.   Age-related osteoporosis without current pathological fracture - DEXA updated on 09/12/2023: LFN T-score -2.4, RFN T-score -2.3, lumbarT-score -1.6.  Previous DEXA 04/23/21: LFN T score -2.7.  Intolerance to Fosamax,Boniva.  She is taking calcium  and vitamin D  supplement.  Vitamin D  deficiency: She is taking vitamin D  1000 units daily.  Closed fracture of coccyx with routine healing, subsequent encounter  Fibromyalgia: Intermittent myalgias and muscle tenderness due to fibromyalgia.  Other fatigue: Chronic, stable. She has been trying to exercise on a regular basis.    Other medical conditions are listed as  follows:  Primary hypertension: Blood pressure was 105/69 today in the office.  History of shingles  History of breast cancer  Vitamin B12 deficiency  Hx of migraines  Orders: No orders of the defined types were placed in this encounter.  No orders of the defined types were placed in this encounter.   Follow-Up Instructions: Return in about 3 months (around 12/02/2024).   Waddell CHRISTELLA Craze, PA-C  Note - This record has been created using Dragon software.  Chart creation errors have been sought, but may not always  have been located. Such creation errors do not reflect on  the standard of medical care.

## 2024-08-29 ENCOUNTER — Ambulatory Visit: Admitting: Physician Assistant

## 2024-09-02 ENCOUNTER — Ambulatory Visit: Attending: Physician Assistant | Admitting: Physician Assistant

## 2024-09-02 ENCOUNTER — Encounter: Payer: Self-pay | Admitting: Physician Assistant

## 2024-09-02 VITALS — BP 105/69 | HR 71 | Temp 97.9°F | Resp 14 | Ht 64.5 in

## 2024-09-02 DIAGNOSIS — M19042 Primary osteoarthritis, left hand: Secondary | ICD-10-CM | POA: Insufficient documentation

## 2024-09-02 DIAGNOSIS — I10 Essential (primary) hypertension: Secondary | ICD-10-CM | POA: Diagnosis present

## 2024-09-02 DIAGNOSIS — Z853 Personal history of malignant neoplasm of breast: Secondary | ICD-10-CM | POA: Diagnosis present

## 2024-09-02 DIAGNOSIS — M25462 Effusion, left knee: Secondary | ICD-10-CM | POA: Insufficient documentation

## 2024-09-02 DIAGNOSIS — S322XXD Fracture of coccyx, subsequent encounter for fracture with routine healing: Secondary | ICD-10-CM | POA: Insufficient documentation

## 2024-09-02 DIAGNOSIS — Z8669 Personal history of other diseases of the nervous system and sense organs: Secondary | ICD-10-CM | POA: Diagnosis present

## 2024-09-02 DIAGNOSIS — Z79899 Other long term (current) drug therapy: Secondary | ICD-10-CM | POA: Insufficient documentation

## 2024-09-02 DIAGNOSIS — M359 Systemic involvement of connective tissue, unspecified: Secondary | ICD-10-CM | POA: Insufficient documentation

## 2024-09-02 DIAGNOSIS — M19072 Primary osteoarthritis, left ankle and foot: Secondary | ICD-10-CM | POA: Diagnosis present

## 2024-09-02 DIAGNOSIS — M19071 Primary osteoarthritis, right ankle and foot: Secondary | ICD-10-CM | POA: Diagnosis present

## 2024-09-02 DIAGNOSIS — E559 Vitamin D deficiency, unspecified: Secondary | ICD-10-CM | POA: Diagnosis present

## 2024-09-02 DIAGNOSIS — M797 Fibromyalgia: Secondary | ICD-10-CM | POA: Diagnosis present

## 2024-09-02 DIAGNOSIS — E538 Deficiency of other specified B group vitamins: Secondary | ICD-10-CM | POA: Insufficient documentation

## 2024-09-02 DIAGNOSIS — M65331 Trigger finger, right middle finger: Secondary | ICD-10-CM | POA: Insufficient documentation

## 2024-09-02 DIAGNOSIS — R5383 Other fatigue: Secondary | ICD-10-CM | POA: Insufficient documentation

## 2024-09-02 DIAGNOSIS — M47816 Spondylosis without myelopathy or radiculopathy, lumbar region: Secondary | ICD-10-CM | POA: Insufficient documentation

## 2024-09-02 DIAGNOSIS — Z8619 Personal history of other infectious and parasitic diseases: Secondary | ICD-10-CM | POA: Diagnosis present

## 2024-09-02 DIAGNOSIS — G8929 Other chronic pain: Secondary | ICD-10-CM | POA: Diagnosis present

## 2024-09-02 DIAGNOSIS — M25511 Pain in right shoulder: Secondary | ICD-10-CM | POA: Diagnosis present

## 2024-09-02 DIAGNOSIS — M810 Age-related osteoporosis without current pathological fracture: Secondary | ICD-10-CM | POA: Insufficient documentation

## 2024-09-02 DIAGNOSIS — M5030 Other cervical disc degeneration, unspecified cervical region: Secondary | ICD-10-CM | POA: Insufficient documentation

## 2024-09-02 DIAGNOSIS — M19041 Primary osteoarthritis, right hand: Secondary | ICD-10-CM | POA: Insufficient documentation

## 2024-09-02 DIAGNOSIS — M1611 Unilateral primary osteoarthritis, right hip: Secondary | ICD-10-CM | POA: Insufficient documentation

## 2024-09-02 NOTE — Patient Instructions (Signed)
 Standing Labs We placed an order today for your standing lab work.   Please have your standing labs drawn in October and then every 3 months   Please have your labs drawn 2 weeks prior to your appointment so that the provider can discuss your lab results at your appointment, if possible.  Please note that you may see your imaging and lab results in MyChart before we have reviewed them. We will contact you once all results are reviewed. Please allow our office up to 72 hours to thoroughly review all of the results before contacting the office for clarification of your results.  WALK-IN LAB HOURS  Monday through Thursday from 8:00 am -12:30 pm and 1:00 pm-4:30 pm and Friday from 8:00 am-12:00 pm.  Patients with office visits requiring labs will be seen before walk-in labs.  You may encounter longer than normal wait times. Please allow additional time. Wait times may be shorter on  Monday and Thursday afternoons.  We do not book appointments for walk-in labs. We appreciate your patience and understanding with our staff.   Labs are drawn by [facility]. Please bring your co-pay at the time of your lab draw.  You may receive a bill from [facility] for your lab work.  Please note if you are on Hydroxychloroquine  and and an order has been placed for a Hydroxychloroquine  level,  you will need to have it drawn 4 hours or more after your last dose.  If you wish to have your labs drawn at another location, please call the office 24 hours in advance so we can fax the orders.  The office is located at [address], [address], [place], [place] [address]   If you have any questions regarding directions or hours of operation,  please call [phone].   As a reminder, please drink plenty of water prior to coming for your lab work. Thanks!

## 2024-09-06 ENCOUNTER — Other Ambulatory Visit: Payer: Self-pay | Admitting: Physician Assistant

## 2024-09-09 NOTE — Telephone Encounter (Signed)
 Last Fill: 06/17/2024  Eye exam: 05/30/2024 WNL    Labs: 08/01/2024 CBC WNL  Creatinine remains slightly elevated but has improved-1.09 and GFR has improved from 40 to 52.  Rest of CMP WNL.    Next Visit: 12/02/2024  Last Visit: 09/02/2024  DX: Autoimmune disease    Current Dose per office note 09/02/2024: Plaquenil  200 mg 1 tablet BID Monday to Friday.   Okay to refill Plaquenil ?

## 2024-09-10 ENCOUNTER — Telehealth: Payer: Self-pay

## 2024-09-10 NOTE — Telephone Encounter (Signed)
 Patient called to check status of PLQ rx as the pharmacy advised we did not send a 90 day supply. I advised patient we did send a 90 day supply on 09/09/2024. I called the pharmacy to confirm. Their automatic system marked the rx as not being the correct amount but the dispense amount matches the sig. The pharmacy tech was pushing the rx through and getting it filled for the patient. I called the patient and advised.

## 2024-09-12 ENCOUNTER — Other Ambulatory Visit: Payer: Self-pay | Admitting: Internal Medicine

## 2024-10-02 ENCOUNTER — Other Ambulatory Visit: Payer: Self-pay | Admitting: *Deleted

## 2024-10-02 DIAGNOSIS — Z79899 Other long term (current) drug therapy: Secondary | ICD-10-CM

## 2024-10-03 ENCOUNTER — Ambulatory Visit: Payer: Self-pay | Admitting: Physician Assistant

## 2024-10-03 DIAGNOSIS — Z79899 Other long term (current) drug therapy: Secondary | ICD-10-CM

## 2024-10-03 LAB — CBC WITH DIFFERENTIAL/PLATELET
Absolute Lymphocytes: 942 {cells}/uL (ref 850–3900)
Absolute Monocytes: 396 {cells}/uL (ref 200–950)
Basophils Absolute: 62 {cells}/uL (ref 0–200)
Basophils Relative: 1.4 %
Eosinophils Absolute: 88 {cells}/uL (ref 15–500)
Eosinophils Relative: 2 %
HCT: 36.5 % (ref 35.0–45.0)
Hemoglobin: 12.2 g/dL (ref 11.7–15.5)
MCH: 31 pg (ref 27.0–33.0)
MCHC: 33.4 g/dL (ref 32.0–36.0)
MCV: 92.6 fL (ref 80.0–100.0)
MPV: 10.3 fL (ref 7.5–12.5)
Monocytes Relative: 9 %
Neutro Abs: 2913 {cells}/uL (ref 1500–7800)
Neutrophils Relative %: 66.2 %
Platelets: 261 Thousand/uL (ref 140–400)
RBC: 3.94 Million/uL (ref 3.80–5.10)
RDW: 12.1 % (ref 11.0–15.0)
Total Lymphocyte: 21.4 %
WBC: 4.4 Thousand/uL (ref 3.8–10.8)

## 2024-10-03 LAB — COMPREHENSIVE METABOLIC PANEL WITH GFR
AG Ratio: 1.8 (calc) (ref 1.0–2.5)
ALT: 47 U/L — ABNORMAL HIGH (ref 6–29)
AST: 30 U/L (ref 10–35)
Albumin: 4.4 g/dL (ref 3.6–5.1)
Alkaline phosphatase (APISO): 85 U/L (ref 37–153)
BUN/Creatinine Ratio: 19 (calc) (ref 6–22)
BUN: 22 mg/dL (ref 7–25)
CO2: 23 mmol/L (ref 20–32)
Calcium: 9.3 mg/dL (ref 8.6–10.4)
Chloride: 108 mmol/L (ref 98–110)
Creat: 1.18 mg/dL — ABNORMAL HIGH (ref 0.60–1.00)
Globulin: 2.4 g/dL (ref 1.9–3.7)
Glucose, Bld: 109 mg/dL — ABNORMAL HIGH (ref 65–99)
Potassium: 4.6 mmol/L (ref 3.5–5.3)
Sodium: 139 mmol/L (ref 135–146)
Total Bilirubin: 0.4 mg/dL (ref 0.2–1.2)
Total Protein: 6.8 g/dL (ref 6.1–8.1)
eGFR: 48 mL/min/1.73m2 — ABNORMAL LOW (ref >=60)

## 2024-10-03 NOTE — Progress Notes (Signed)
 CBC WNL Creatinine remains elevated and has increased slightly-1.18 and GFR is low-48.   ALT is elevated-47. AST WNL.  Please clarify what dose of arava  she is taking? Any tylenol or NSAID use?  Any recent alcohol use? Any other medication changes?

## 2024-10-04 NOTE — Progress Notes (Signed)
 Recommend avoiding the use of tylenol and any NSAIDs. Recheck CMP in 2-3 weeks.

## 2024-10-07 NOTE — Progress Notes (Unsigned)
 Subjective:    Patient ID: [name], female    DOB: 04/16/1946, 78 y.o.   MRN: [id number]     HPI [name] is here for follow up of her chronic medical problems.   BP still variable at home - no pattern to when it is high or low.  When low she feels very tired.  When high she will have a headache.    110/75, 135/81, 161/95, 147/83, 133/80, 137/85, 116/73, 170/95, 158/96, 165/95, 114/74, 152/91, 119/75    H 70's - 98  Cough  - several weeks.   Medications and allergies reviewed with patient and updated if appropriate.  Current Outpatient Medications on File Prior to Visit  Medication Sig Dispense Refill   acetaminophen (TYLENOL) 500 MG tablet Take 1,000 mg by mouth daily as needed for mild pain.     amLODipine  (NORVASC ) 5 MG tablet TAKE 1 TABLET(5 MG) BY MOUTH DAILY 90 tablet 1   CALCIUM  PO Take by mouth daily.     cholecalciferol (VITAMIN D3) 25 MCG (1000 UNIT) tablet Take 1,000 Units by mouth daily.     CRANBERRY PO Take by mouth.     Cyanocobalamin  (B-12 PO) Take by mouth.     eletriptan  (RELPAX ) 20 MG tablet TAKE 1 TABLET BY MOUTH EVERY 2 HOURS AS NEEDED FOR HEADACHE OR MIGRAINE. MAY REPEAT IN 2 HOURS IF HEADACHE PERSISTS OR RECURS. 9 tablet 1   hydroxychloroquine  (PLAQUENIL ) 200 MG tablet TAKE 1 TABLET BY MOUTH TWICE DAILY( EVERY TWELVE HOURS) MONDAY THROUGH FRIDAY 120 tablet 0   leflunomide  (ARAVA ) 10 MG tablet TAKE 1 TABLET(10 MG) BY MOUTH DAILY 90 tablet 0   losartan  (COZAAR ) 50 MG tablet 50mg  at 1/2 tablet every morning and 1 tablet every evening. 135 tablet 3   Probiotic Product (PROBIOTIC DAILY PO) Take by mouth.     RESTASIS 0.05 % ophthalmic emulsion SMARTSIG:1 Drop(s) In Eye(s) Every 12 Hours     traMADol  (ULTRAM ) 50 MG tablet Take 1 tablet (50 mg total) by mouth every 12 (twelve) hours as needed. 60 tablet 0   traZODone  (DESYREL ) 50 MG tablet TAKE 3 TABLETS(150 MG) BY MOUTH AT BEDTIME 270 tablet 1   No current facility-administered medications on file  prior to visit.     Review of Systems  Constitutional:  Positive for fatigue.  HENT:  Positive for congestion, ear pain (right ear - pain nearear on  face) and postnasal drip. Negative for sinus pressure and sore throat.   Respiratory:  Positive for cough, shortness of breath (a little) and wheezing (a little).   Cardiovascular:  Negative for chest pain and palpitations.  Gastrointestinal:        [name] - 2/ past 2-3 months  Neurological:  Positive for headaches (with higher BP).  Psychiatric/Behavioral:  Negative for sleep disturbance (8-9 hrs - good quality).        Objective:   Vitals:   10/08/24 1603  BP: 116/74  Pulse: 73  Temp: 98.3 F (36.8 C)  SpO2: 98%   BP Readings from Last 3 Encounters:  10/08/24 116/74  09/02/24 105/69  07/30/24 106/78   Wt Readings from Last 3 Encounters:  06/26/24 138 lb (62.6 kg)  06/11/24 137 lb (62.1 kg)  03/25/24 139 lb (63 kg)   Body mass index is 23.32 kg/m.    Physical Exam Constitutional:      General: She is not in acute distress.    Appearance: Normal appearance.  HENT:  Head: Normocephalic and atraumatic.  Eyes:     Conjunctiva/sclera: Conjunctivae normal.  Cardiovascular:     Rate and Rhythm: Normal rate and regular rhythm.  Pulmonary:     Effort: Pulmonary effort is normal. No respiratory distress.     Breath sounds: Normal breath sounds. No wheezing.  Musculoskeletal:     Cervical back: Neck supple.     Right lower leg: No edema.     Left lower leg: No edema.  Lymphadenopathy:     Cervical: No cervical adenopathy.  Skin:    General: Skin is warm and dry.     Findings: No rash.  Neurological:     Mental Status: She is alert. Mental status is at baseline.  Psychiatric:        Mood and Affect: Mood normal.        Behavior: Behavior normal.        Lab Results  Component Value Date   WBC 4.4 10/02/2024   HGB 12.2 10/02/2024   HCT 36.5 10/02/2024   PLT 261 10/02/2024   GLUCOSE 109 (H) 10/02/2024    CHOL 225 (H) 03/10/2023   TRIG 110.0 03/10/2023   HDL 76.90 03/10/2023   LDLDIRECT 122.6 06/03/2013   LDLCALC 126 (H) 03/10/2023   ALT 47 (H) 10/02/2024   AST 30 10/02/2024   NA 139 10/02/2024   K 4.6 10/02/2024   CL 108 10/02/2024   CREATININE 1.18 (H) 10/02/2024   BUN 22 10/02/2024   CO2 23 10/02/2024   TSH 2.29 03/10/2023   HGBA1C 5.2 03/06/2020     Assessment & Plan:    See Problem List for Assessment and Plan of chronic medical problems.

## 2024-10-07 NOTE — Assessment & Plan Note (Signed)
 Chronic Stage 3a-b Has been stable over the years -saw nephrology once and they thought most likely her decreased kidney function was related to nephrosclerosis related to aging Both her and I are concerned about the variability in her blood pressure at home-typically tends to be well-controlled in the office She is symptomatic with variation in blood pressure Discussed that we could try a different BP medication to see if it stabilizes her blood pressure better She plans on stopping the leflunomide  and will do that first and then see if that has any impact

## 2024-10-08 ENCOUNTER — Ambulatory Visit (INDEPENDENT_AMBULATORY_CARE_PROVIDER_SITE_OTHER): Admitting: Internal Medicine

## 2024-10-08 VITALS — BP 116/74 | HR 73 | Temp 98.3°F | Ht 64.5 in

## 2024-10-08 DIAGNOSIS — R5383 Other fatigue: Secondary | ICD-10-CM

## 2024-10-08 DIAGNOSIS — N1832 Chronic kidney disease, stage 3b: Secondary | ICD-10-CM

## 2024-10-08 DIAGNOSIS — R052 Subacute cough: Secondary | ICD-10-CM | POA: Diagnosis not present

## 2024-10-08 DIAGNOSIS — I10 Essential (primary) hypertension: Secondary | ICD-10-CM

## 2024-10-08 DIAGNOSIS — G479 Sleep disorder, unspecified: Secondary | ICD-10-CM

## 2024-10-08 MED ORDER — LOSARTAN POTASSIUM 50 MG PO TABS: 50.0000 mg | ORAL_TABLET | Freq: Two times a day (BID) | ORAL | Status: DC

## 2024-10-08 NOTE — Patient Instructions (Signed)
    Monitor your cough.  Consider pepcid daily for a couple of weeks.    Monitor your BP.    Medications changes include :   None

## 2024-10-08 NOTE — Assessment & Plan Note (Signed)
Chronic Controlled, Stable Continue trazodone 150 mg at bedtime

## 2024-10-08 NOTE — Assessment & Plan Note (Signed)
 Chronic Blood pressure well-controlled here today, but variable at home She is symptomatic with high blood pressures and low blood pressures No pattern to variability Discussed possibly changing one of her medications to see if that helped stabilize her BP more Will hold off for now and she will continue to monitor Continue amlodipine  5 mg daily and losartan  50 mg twice daily

## 2024-10-08 NOTE — Assessment & Plan Note (Signed)
 Subacute Having fatigue that is significantly affecting her quality of life Sleep-taking trazodone  and gets 8-9 hours of good quality sleep ?  Related to leflunomide  Medication has not helped her arthritis symptoms so she plans on discontinuing it-Will discuss with rheumatology Hopefully this will improve her energy levels

## 2024-10-08 NOTE — Assessment & Plan Note (Signed)
 Subacute Having dry cough for several weeks Has some PND and possibly allergies, but does not tolerate antihistamines Could try Flonase  Has rare GERD which is another possibility-could try Pepcid over-the-counter for a few weeks to see if that helps Lungs are clear and no evidence of infection

## 2024-10-09 ENCOUNTER — Telehealth: Payer: Self-pay

## 2024-10-09 NOTE — Telephone Encounter (Signed)
 Patient contacted the office and states she has decided she would like to stop the Leflunomide  due to her liver function. Patient inquires if there is any protocol and tapering she needs to do to get off of the medication. Please advise.

## 2024-10-09 NOTE — Telephone Encounter (Signed)
 Patient advised Ok to discontinue. Patient verbalized understanding.

## 2024-10-09 NOTE — Telephone Encounter (Signed)
 Ok to discontinue

## 2024-10-21 ENCOUNTER — Other Ambulatory Visit: Payer: Self-pay

## 2024-10-21 DIAGNOSIS — Z79899 Other long term (current) drug therapy: Secondary | ICD-10-CM

## 2024-10-22 ENCOUNTER — Encounter: Payer: Self-pay | Admitting: Internal Medicine

## 2024-10-22 ENCOUNTER — Ambulatory Visit: Payer: Self-pay | Admitting: Physician Assistant

## 2024-10-22 DIAGNOSIS — N1832 Chronic kidney disease, stage 3b: Secondary | ICD-10-CM

## 2024-10-22 LAB — COMPREHENSIVE METABOLIC PANEL WITH GFR
AG Ratio: 1.9 (calc) (ref 1.0–2.5)
ALT: 11 U/L (ref 6–29)
AST: 13 U/L (ref 10–35)
Albumin: 4.4 g/dL (ref 3.6–5.1)
Alkaline phosphatase (APISO): 71 U/L (ref 37–153)
BUN/Creatinine Ratio: 19 (calc) (ref 6–22)
BUN: 23 mg/dL (ref 7–25)
CO2: 23 mmol/L (ref 20–32)
Calcium: 9 mg/dL (ref 8.6–10.4)
Chloride: 111 mmol/L — ABNORMAL HIGH (ref 98–110)
Creat: 1.21 mg/dL — ABNORMAL HIGH (ref 0.60–1.00)
Globulin: 2.3 g/dL (ref 1.9–3.7)
Glucose, Bld: 99 mg/dL (ref 65–99)
Potassium: 4.6 mmol/L (ref 3.5–5.3)
Sodium: 141 mmol/L (ref 135–146)
Total Bilirubin: 0.4 mg/dL (ref 0.2–1.2)
Total Protein: 6.7 g/dL (ref 6.1–8.1)
eGFR: 46 mL/min/1.73m2 — ABNORMAL LOW (ref >=60)

## 2024-10-22 NOTE — Progress Notes (Signed)
 LFTs have returned to WNL-great news!  Creatinine remains elevated and continue to trend up-1.21 and GFR is low and continues to trend down-46-recommend evaluation by PCP. Dr. Dolphus recommends evaluation by nephrology.

## 2024-10-31 ENCOUNTER — Encounter: Payer: Self-pay | Admitting: Internal Medicine

## 2024-10-31 ENCOUNTER — Encounter (HOSPITAL_BASED_OUTPATIENT_CLINIC_OR_DEPARTMENT_OTHER): Payer: Self-pay | Admitting: Cardiovascular Disease

## 2024-10-31 MED ORDER — HYDROCHLOROTHIAZIDE 12.5 MG PO TABS: 12.5000 mg | ORAL_TABLET | Freq: Every day | ORAL | 3 refills | Status: DC

## 2024-10-31 NOTE — Telephone Encounter (Signed)
 Please assist to schedule follow up with TR or APP. Okay to use TOC slot if needed.   Desirie Minteer S Mikena Masoner, NP

## 2024-11-01 NOTE — Telephone Encounter (Signed)
 Scheduled w CW 11/04/24

## 2024-11-04 ENCOUNTER — Encounter (HOSPITAL_BASED_OUTPATIENT_CLINIC_OR_DEPARTMENT_OTHER): Payer: Self-pay | Admitting: Family

## 2024-11-04 ENCOUNTER — Ambulatory Visit (INDEPENDENT_AMBULATORY_CARE_PROVIDER_SITE_OTHER): Admitting: Family

## 2024-11-04 VITALS — BP 120/76 | HR 68 | Ht 64.5 in

## 2024-11-04 DIAGNOSIS — R0789 Other chest pain: Secondary | ICD-10-CM | POA: Diagnosis not present

## 2024-11-04 DIAGNOSIS — I10 Essential (primary) hypertension: Secondary | ICD-10-CM | POA: Diagnosis not present

## 2024-11-04 MED ORDER — LOSARTAN POTASSIUM 50 MG PO TABS: 50.0000 mg | ORAL_TABLET | Freq: Two times a day (BID) | ORAL | 1 refills | Status: DC

## 2024-11-04 NOTE — Progress Notes (Signed)
 [facility] Assessment:    Date:  11/04/2024   ID:  [name], DOB 04-Nov-1946, MRN [id number]  PCP:  Geofm Glade PARAS, MD  Cardiologist:  None  Nephrologist:  Referring MD: Geofm Glade PARAS, MD   CC: Hypertension  History of Present Illness:    [name] is a 78 y.o. female with a hx of HTN, HLD, CKDIIIa, prior breast cancer here to follow up in the [facility].   Established with [facility] 08/18/23 as BP remained elevated and labile despite Bystolic , Amlodipine , Losartan . Cortisol and plasma metanephrines/catecholamines were normal. At PharmD visit 09/18/23 Amlodipine  was stopped and Losartan  increased to 50mg  BID. At visit 10/18/23 her BP was controlled on Losartan  50mg  BID, resumption of Bystolic  was deferred. ED visit 11/22/23 with elevated BP, Amlodipine  5mg  was resumed.   At visit 12/21/23 BP controlled on Losartan  50mg  BID, Amlodipine  5mg  daily. Seen 06/11/24 by Dr. Raford with hypotension, morning dose of Losartan  reduced to 25mg  and evening dose 50mg  continued. She noted family history of ASD in her brother but was asymptomatic. Echo 07/15/24 normal LVEF 60-65%, no RWMA, RV normal, trivial MR.  She contacted the office 10/31/24 noting home BP 140-150s and swelling in her feet. She was recommended follow up appointment. Encouraged to elevate her legs, consider compression stockings for LE edema. She contact PCP for same, who suggested discontinue amlodipine , increase losartan  to 50mg  BID, and add hydrochlorothiazide  12.5mg  daily.   Presents today for follow up. She brings a home BP log with systolic readings averages 135. Monitors BP midday and in evening. She did not make med changes per PCP as she did not receive MyChart message with those instructions. Has continued taking losartan  25mg  AM and losartan  50mg  + amlodipine  5mg  HS. She notes the bilateral pedal/ankle edema that had been ongoing for 2 weeks has resolved. She  monitors sodium in diet and elevates legs. Echo 06/2024 with normal LV systolic & diastolic function. She is concerned about BP fluctuation impacting renal function; reassured less likely to do so in absence of sustained BP >150.   Several weeks ago while walking she noted chest pain that last 10-15 minutes causing her to stop activity. There were no other associated symptoms. No recurrence. Prior CAC score = 0.   She denies shortness of breath, lightheadedness, dizziness, headaches. Occasional palpitations that are not new or worse in frequency/intensity.   Previous antihypertensives:   Past Medical History:  Diagnosis Date   Arthritis    Breast cancer (HCC)    R lumpectomy ; radiation; no chemotherapy   Bronchitis    recurrent   Bulging lumbar disc    Cataract    multifocal lens implant   Chronic low back pain    bulging disc & spinal stenosis   Coccygeal fracture (HCC) 05/15/2021   COVID-19    COVID-19 02/2023   Cystitis    Exertional dyspnea 06/11/2024   Fibromyalgia    Hyperlipemia    Mitral valve prolapse    Osteoporosis    Personal history of radiation therapy    Pneumonia    as OP X 2; no Pneumovax   Ruptured disc, thoracic    Stage 3 chronic kidney disease (HCC)    Tendinitis of both ankles 05/29/2024   Tendinitis of both feet 05/29/2024   Vitamin D  deficiency    19 in  01/2009    Past Surgical History:  Procedure Laterality Date   ABDOMINAL HYSTERECTOMY  1990   [facility]  for fibroid , Dr Freya   BREAST LUMPECTOMY Right    BREAST SURGERY  2010   R side lumpectomy; Dr Merrilyn   CATARACT EXTRACTION, BILATERAL  2017   CESAREAN SECTION      G 2 P 2   CYSTECTOMY  2010   EYE SURGERY     eye lid surgery   FOOT SURGERY Right    no colonoscopy     I just don't want to do it. SOC reviewed    Current Medications: Current Meds  Medication Sig   acetaminophen (TYLENOL) 500 MG tablet Take 1,000 mg by mouth daily as needed for mild pain.   CALCIUM  PO Take by mouth  daily.   cholecalciferol (VITAMIN D3) 25 MCG (1000 UNIT) tablet Take 1,000 Units by mouth daily.   CRANBERRY PO Take by mouth.   Cyanocobalamin  (B-12 PO) Take by mouth.   eletriptan  (RELPAX ) 20 MG tablet TAKE 1 TABLET BY MOUTH EVERY 2 HOURS AS NEEDED FOR HEADACHE OR MIGRAINE. MAY REPEAT IN 2 HOURS IF HEADACHE PERSISTS OR RECURS.   hydroxychloroquine  (PLAQUENIL ) 200 MG tablet TAKE 1 TABLET BY MOUTH TWICE DAILY( EVERY TWELVE HOURS) MONDAY THROUGH FRIDAY   Probiotic Product (PROBIOTIC DAILY PO) Take by mouth.   RESTASIS 0.05 % ophthalmic emulsion SMARTSIG:1 Drop(s) In Eye(s) Every 12 Hours   traMADol  (ULTRAM ) 50 MG tablet Take 1 tablet (50 mg total) by mouth every 12 (twelve) hours as needed.   traZODone  (DESYREL ) 50 MG tablet TAKE 3 TABLETS(150 MG) BY MOUTH AT BEDTIME   [DISCONTINUED] hydrochlorothiazide  (HYDRODIURIL ) 12.5 MG tablet Take 1 tablet (12.5 mg total) by mouth daily.   [DISCONTINUED] losartan  (COZAAR ) 50 MG tablet Take 1 tablet (50 mg total) by mouth 2 (two) times daily. (Patient taking differently: Taking 1/2 tablet (25 mg) in the morning and 1 tablet (50 mg) in the evening)     Allergies:   Albuterol, Morphine, Alendronate, Augmentin [amoxicillin-pot clavulanate], Ciprofloxacin, Fish allergy, Gabapentin , Methotrexate, Prednisone , Risedronate sodium, Cyclobenzaprine , and Ibandronate sodium   Social History   Socioeconomic History   Marital status: Married    Spouse name: Kayla   Number of children: Not on file   Years of education: Not on file   Highest education level: Bachelor's degree (e.g., BA, AB, BS)  Occupational History   Occupation: Retired  Tobacco Use   Smoking status: Former    Current packs/day: 0.00    Average packs/day: 0.1 packs/day for 6.0 years (0.6 ttl pk-yrs)    Types: Cigarettes    Start date: 12/19/1964    Quit date: 12/19/1970    Years since quitting: 53.9    Passive exposure: Never   Smokeless tobacco: Never   Tobacco comments:    1968-1972, up to 3  cigarettes / day  Vaping Use   Vaping status: Never Used  Substance and Sexual Activity   Alcohol use: No   Drug use: No   Sexual activity: Not on file  Other Topics Concern   Not on file  Social History Narrative   Lives with husband   Social Drivers of Health   Financial Resource Strain: Low Risk  (10/08/2024)   Overall Financial Resource Strain (CARDIA)    Difficulty of Paying Living Expenses: Not hard at all  Food Insecurity: No Food Insecurity (11/04/2024)   Hunger Vital Sign    Worried About Running Out of Food in the Last Year: Never true    Ran Out of Food in the Last Year: Never true  Transportation Needs: No  Transportation Needs (10/08/2024)   PRAPARE - Administrator, Civil Service (Medical): No    Lack of Transportation (Non-Medical): No  Physical Activity: Sufficiently Active (10/08/2024)   Exercise Vital Sign    Days of Exercise per Week: 5 days    Minutes of Exercise per Session: 30 min  Stress: No Stress Concern Present (10/08/2024)   [employer] of Occupational Health - Occupational Stress Questionnaire    Feeling of Stress: Only a little  Social Connections: Socially Integrated (10/08/2024)   Social Connection and Isolation Panel    Frequency of Communication with Friends and Family: More than three times a week    Frequency of Social Gatherings with Friends and Family: Three times a week    Attends Religious Services: More than 4 times per year    Active Member of Clubs or Organizations: Yes    Attends Engineer, Structural: More than 4 times per year    Marital Status: Married     Family History: The patient's family history includes Atrial fibrillation in her brother and mother; Dementia in her mother; Diabetes in her brother; Healthy in her son; Heart attack in her paternal aunt, paternal grandfather, paternal grandmother, and paternal uncle; Heart attack (age of onset: 70) in her father; Hypertension in her brother and mother;  Other in her brother; Peptic Ulcer in her brother; Thyroid  disease in her daughter; Ulcers in her mother. There is no history of Stroke or Breast cancer.  ROS:   Please see the history of present illness.     All other systems reviewed and are negative.  EKGs/Labs/Other Studies Reviewed:   Cardiac Studies & Procedures   ______________________________________________________________________________________________     ECHOCARDIOGRAM  ECHOCARDIOGRAM COMPLETE 07/15/2024  Narrative ECHOCARDIOGRAM REPORT    Patient Name:   [name] Date of Exam: 07/15/2024 Medical Rec #:  [id number]          Height:       64.5 in Accession #:    [id number]         Weight:       138.0 lb Date of Birth:  11-Jul-1946           BSA:          1.680 m Patient Age:    78 years           BP:           117/76 mmHg Patient Gender: F                  HR:           68 bpm. Exam Location:  Outpatient  Procedure: 2D Echo, 3D Echo, Cardiac Doppler, Color Doppler and Strain Analysis (Both Spectral and Color Flow Doppler were utilized during procedure).  Indications:    R06.9 DOE; R60.0 Lower extremity edema  History:        Patient has no prior history of Echocardiogram examinations. Signs/Symptoms:Dyspnea and Edema; Risk Factors:Hypertension, Dyslipidemia and Former Smoker. Patient denies chest pain. She does have DOE with bilateral leg edema. She has family history of ASD-brother. She has history of right breast cancer with radiation only.  Sonographer:    Annabella Cater RVT, RDCS (AE), RDMS Referring Phys: [phone] [facility]    Sonographer Comments: Dense breasts. IMPRESSIONS   1. Left ventricular ejection fraction, by estimation, is 60 to 65%. Left ventricular ejection fraction by 3D volume is 68 %. The left ventricle has normal function. The left ventricle has  no regional wall motion abnormalities. Left ventricular diastolic parameters were normal. The average left ventricular global  longitudinal strain is -16.9 %. The global longitudinal strain is normal. 2. Right ventricular systolic function is normal. The right ventricular size is normal. There is normal pulmonary artery systolic pressure. The estimated right ventricular systolic pressure is 26.4 mmHg. 3. The mitral valve is normal in structure. Trivial mitral valve regurgitation. No evidence of mitral stenosis. 4. The aortic valve is tricuspid. Aortic valve regurgitation is trivial. No aortic stenosis is present. 5. The inferior vena cava is normal in size with greater than 50% respiratory variability, suggesting right atrial pressure of 3 mmHg.  Comparison(s): No prior Echocardiogram.  FINDINGS Left Ventricle: Left ventricular ejection fraction, by estimation, is 60 to 65%. Left ventricular ejection fraction by 3D volume is 68 %. The left ventricle has normal function. The left ventricle has no regional wall motion abnormalities. The average left ventricular global longitudinal strain is -16.9 %. Strain was performed and the global longitudinal strain is normal. The left ventricular internal cavity size was small. There is no left ventricular hypertrophy. Left ventricular diastolic parameters were normal.  Right Ventricle: The right ventricular size is normal. No increase in right ventricular wall thickness. Right ventricular systolic function is normal. There is normal pulmonary artery systolic pressure. The tricuspid regurgitant velocity is 2.42 m/s, and with an assumed right atrial pressure of 3 mmHg, the estimated right ventricular systolic pressure is 26.4 mmHg.  Left Atrium: Left atrial size was normal in size.  Right Atrium: Right atrial size was normal in size.  Pericardium: There is no evidence of pericardial effusion. Presence of epicardial fat layer.  Mitral Valve: The mitral valve is normal in structure. Trivial mitral valve regurgitation. No evidence of mitral valve stenosis.  Tricuspid Valve: The  tricuspid valve is normal in structure. Tricuspid valve regurgitation is mild . No evidence of tricuspid stenosis.  Aortic Valve: The aortic valve is tricuspid. Aortic valve regurgitation is trivial. No aortic stenosis is present. Aortic valve mean gradient measures 3.0 mmHg. Aortic valve peak gradient measures 7.4 mmHg. Aortic valve area, by VTI measures 1.95 cm.  Pulmonic Valve: The pulmonic valve was normal in structure. Pulmonic valve regurgitation is mild. No evidence of pulmonic stenosis.  Aorta: The aortic root and ascending aorta are structurally normal, with no evidence of dilitation.  Venous: The inferior vena cava is normal in size with greater than 50% respiratory variability, suggesting right atrial pressure of 3 mmHg.  IAS/Shunts: The atrial septum is grossly normal.  Additional Comments: 3D was performed not requiring image post processing on an independent workstation and was normal.   LEFT VENTRICLE PLAX 2D LVIDd:         3.54 cm         Diastology LVIDs:         2.06 cm         LV e' medial:    6.58 cm/s LV PW:         0.97 cm         LV E/e' medial:  12.7 LV IVS:        0.76 cm         LV e' lateral:   6.47 cm/s LVOT diam:     1.75 cm         LV E/e' lateral: 12.9 LV SV:         54 LV SV Index:   32  2D Longitudinal LVOT Area:     2.41 cm        Strain 2D Strain GLS   -16.9 % Avg:  3D Volume EF LV 3D EF:    Left ventricul ar ejection fraction by 3D volume is 68 %.  3D Volume EF: 3D EF:        68 % LV EDV:       65 ml LV ESV:       21 ml LV SV:        44 ml  RIGHT VENTRICLE RV S prime:     14.10 cm/s TAPSE (M-mode): 2.6 cm  LEFT ATRIUM             Index        RIGHT ATRIUM           Index LA diam:        3.50 cm 2.08 cm/m   RA Area:     13.10 cm LA Vol (A2C):   45.9 ml 27.32 ml/m  RA Volume:   32.10 ml  19.11 ml/m LA Vol (A4C):   44.5 ml 26.49 ml/m LA Biplane Vol: 45.8 ml 27.26 ml/m AORTIC VALVE                    PULMONIC  VALVE AV Area (Vmax):    1.98 cm     PV Vmax:          0.73 m/s AV Area (Vmean):   1.97 cm     PV Peak grad:     2.1 mmHg AV Area (VTI):     1.95 cm     PR End Diast Vel: 4.93 msec AV Vmax:           136.00 cm/s AV Vmean:          81.300 cm/s AV VTI:            0.277 m AV Peak Grad:      7.4 mmHg AV Mean Grad:      3.0 mmHg LVOT Vmax:         112.00 cm/s LVOT Vmean:        66.600 cm/s LVOT VTI:          0.224 m LVOT/AV VTI ratio: 0.81 AR Vena Contracta: 0.23 cm  AORTA Ao Root diam: 3.40 cm Ao Asc diam:  3.20 cm Ao Arch diam: 2.9 cm  MITRAL VALVE                TRICUSPID VALVE MV Area (PHT): 4.15 cm     TR Peak grad:   23.4 mmHg MV Decel Time: 183 msec     TR Vmax:        242.00 cm/s MV E velocity: 83.60 cm/s MV A velocity: 102.00 cm/s  SHUNTS MV E/A ratio:  0.82         Systemic VTI:  0.22 m Systemic Diam: 1.75 cm  Sunit Tolia Electronically signed by Madonna Large Signature Date/Time: 07/15/2024/3:43:25 PM    Final      CT SCANS  CT CARDIAC SCORING (SELF PAY ONLY) 07/08/2022  Addendum 07/11/2022 11:46 AM ADDENDUM REPORT: 07/11/2022 11:43  ADDENDUM: The following report is an over-read performed by radiologist Dr. Reyes Holder of [facility] on July 11, 2022. This over-read does not include interpretation of cardiac or coronary anatomy or pathology. The coronary calcium  score interpretation by the cardiologist is attached.  COMPARISON:  None.  FINDINGS: Vascular: Aortic atherosclerosis.  No acute non-cardiac vascular finding.  Mediastinum/Nodes: In the visualized portions of the thorax no pathologically enlarged mediastinal, or hilar lymph nodes, noting limited sensitivity for the detection of hilar adenopathy on this noncontrast study. Distal esophagus is grossly unremarkable.  Lungs/Pleura: Within the visualized portions of the thorax there are no suspicious appearing pulmonary nodules or masses, there is no acute consolidative airspace  disease, no pleural effusions and no pneumothorax  Upper Abdomen: Visualized portions of the upper abdomen are unremarkable.  Musculoskeletal: Thoracic spondylosis.  IMPRESSION: No significant incidental noncardiac finding noted.  Aortic Atherosclerosis (ICD10-I70.0).   Electronically Signed By: Reyes Holder M.D. On: 07/11/2022 11:43  Narrative CLINICAL DATA:  Cardiovascular Disease Risk stratification  EXAM: Coronary Calcium  Score  TECHNIQUE: A gated, non-contrast computed tomography scan of the heart was performed using 3mm slice thickness. Axial images were analyzed on a dedicated workstation. Calcium  scoring of the coronary arteries was performed using the Agatston method.  FINDINGS: Coronary arteries: Normal origins.  Coronary Calcium  Score:  Left main: 0  Left anterior descending artery: 0  Left circumflex artery: 0  Right coronary artery: 0  Total: 0  Percentile: 0  Pericardium: Normal.  Aorta: Normal caliber of ascending aorta. Aortic atherosclerosis noted.  Non-cardiac: See separate report from [facility].  IMPRESSION: Coronary calcium  score of 0. This was 0 percentile for age-, race-, and sex-matched controls. Aortic atherosclerosis.  RECOMMENDATIONS: Coronary artery calcium  (CAC) score is a strong predictor of incident coronary heart disease (CHD) and provides predictive information beyond traditional risk factors. CAC scoring is reasonable to use in the decision to withhold, postpone, or initiate statin therapy in intermediate-risk or selected borderline-risk asymptomatic adults (age 33-75 years and LDL-C >=70 to <190 mg/dL) who do not have diabetes or established atherosclerotic cardiovascular disease (ASCVD).* In intermediate-risk (10-year ASCVD risk >=7.5% to <20%) adults or selected borderline-risk (10-year ASCVD risk >=5% to <7.5%) adults in whom a CAC score is measured for the purpose of making a treatment decision the  following recommendations have been made:  If CAC=0, it is reasonable to withhold statin therapy and reassess in 5 to 10 years, as long as higher risk conditions are absent (diabetes mellitus, family history of premature CHD in first degree relatives (males <55 years; females <65 years), cigarette smoking, or LDL >=190 mg/dL).  If CAC is 1 to 99, it is reasonable to initiate statin therapy for patients >=23 years of age.  If CAC is >=100 or >=75th percentile, it is reasonable to initiate statin therapy at any age.  Cardiology referral should be considered for patients with CAC scores >=400 or >=75th percentile.  *2018 AHA/ACC/AACVPR/AAPA/ABC/ACPM/ADA/AGS/APhA/ASPC/NLA/PCNA Guideline on the Management of Blood Cholesterol: A Report of the American College of Cardiology/American Heart Association Task Force on Clinical Practice Guidelines. J Am Coll Cardiol. 2019;73(24):3168-3209.  Shelda Bruckner, MD  Electronically Signed: By: Shelda Bruckner M.D. On: 07/10/2022 21:38     ______________________________________________________________________________________________           Recent Labs: 10/02/2024: Hemoglobin 12.2; Platelets 261 10/21/2024: ALT 11; BUN 23; Creat 1.21; Potassium 4.6; Sodium 141   Recent Lipid Panel    Component Value Date/Time   CHOL 225 (H) 03/10/2023 1352   TRIG 110.0 03/10/2023 1352   HDL 76.90 03/10/2023 1352   CHOLHDL 3 03/10/2023 1352   VLDL 22.0 03/10/2023 1352   LDLCALC 126 (H) 03/10/2023 1352   LDLDIRECT 122.6 06/03/2013 1054    Physical Exam:   VS:  BP 120/76 (BP Location: Left Arm, Patient Position: Sitting,  Cuff Size: Normal)   Pulse 68   Ht 5' 4.5 (1.638 m)   SpO2 98%   BMI 23.32 kg/m  , BMI Body mass index is 23.32 kg/m.  BP Readings from Last 3 Encounters:  11/04/24 120/76  10/08/24 116/74  09/02/24 105/69     GENERAL:  Well appearing HEENT: Pupils equal round and reactive, fundi not visualized, oral  mucosa unremarkable NECK:  No jugular venous distention, waveform within normal limits, carotid upstroke brisk and symmetric, no bruits, no thyromegaly LYMPHATICS:  No cervical adenopathy LUNGS:  Clear to auscultation bilaterally HEART:  RRR.  PMI not displaced or sustained,S1 and S2 within normal limits, no S3, no S4, no clicks, no rubs, no murmurs ABD:  Flat, positive bowel sounds normal in frequency in pitch, no bruits, no rebound, no guarding, no midline pulsatile mass, no hepatomegaly, no splenomegaly EXT:  2 plus pulses throughout, no edema, no cyanosis no clubbing SKIN:  No rashes no nodules NEURO:  Cranial nerves II through XII grossly intact, motor grossly intact throughout PSYCH:  Cognitively intact, oriented to person place and time   ASSESSMENT/PLAN:    HTN, goal <130/80 - BP at goal by in office reading today and prior clinic visits per chart review. Home systolic BP average 135 by record provided today, on losartan  25mg  AM and losartan  50mg  HS + amlodipine  5mg  HS. 10/21/24 labs: creat 1.21, GFR 46, K+ 4.6 With bilateral LE edema (improved) and to simplify medication regimen will increase losartan  to 50mg  BID and discontinue amlodipine  Consider initiation of hydrochlorothiazide  if recurrent bilateral LE edema and/or BP not at goal by home readings, after medication change Repeat BMET in 1-2 weeks to follow renal function  Atypical chest pain - CAC score = 0 in July 2023. Echo from July 2025 with normal LVEF and no WMA. Less likely to suspect cardiac etiology in absence of other anginal symptoms and with prior reassuring diagnostic studies. Isolated episode of chest pain while walking which has not recurred. She has returned to her regular exercise regimen. Will continue to monitor for recurrence and consider CCTA if recurrent.   Autoimmune disease (FM, Raynaud's, inflammatory arthritis)  - Follows with rheumatology team.  CKDIIIA -  BMET completed 10/21/24 with creatinine 1.21, GFR  46. Has upcoming appointment with Dr. Tobie of [facility] in December. Repeat BMET in 1-2 weeks after increase in losartan  dose. Reassured that ARB is a renal protective medication.   Screening for Secondary Hypertension:     08/18/2023    3:03 PM  Causes  Drugs/Herbals Screened     - Comments No NSAIDS.  Minimal caffeine.  No EtOH.  No supplements.  Renovascular HTN Screened     - Comments check renal Dopplers  Sleep Apnea Screened     - Comments No snoring.  Thyroid  Disease Screened  Hyperaldosteronism N/A  Pheochromocytoma Screened     - Comments check catecholamin\es/metanephrines  Cushing's Syndrome N/A  Hyperparathyroidism Screened  Coarctation of the Aorta Screened     - Comments BP symmetric    Relevant Labs/Studies:    Latest Ref Rng & Units 10/21/2024   10:44 AM 10/02/2024    1:53 PM 08/01/2024    1:43 PM  Basic Labs  Sodium 135 - 146 mmol/L 141  139  139   Potassium 3.5 - 5.3 mmol/L 4.6  4.6  5.1   Creatinine 0.60 - 1.00 mg/dL 8.78  8.81  8.90        Latest Ref Rng & Units 03/10/2023  1:52 PM 04/11/2022   11:45 AM  Thyroid    TSH 0.35 - 5.50 uIU/mL 2.29  3.67           Latest Ref Rng & Units 08/23/2023   10:29 AM  Metanephrines/Catecholamines   Epinephrine 0 - 62 pg/mL <15   Norepinephrine 0 - 874 pg/mL 651   Dopamine 0 - 48 pg/mL <30   Metanephrines 0.0 - 88.0 pg/mL <25.0   Normetanephrines  0.0 - 285.2 pg/mL 89.5             Disposition:    FU with MD/APP/PharmD in 6 months om [facility]   Medication Adjustments/Labs and Tests Ordered: Current medicines are reviewed at length with the patient today.  Concerns regarding medicines are outlined above.  No orders of the defined types were placed in this encounter.  Meds ordered this encounter  Medications   losartan  (COZAAR ) 50 MG tablet    Sig: Take 1 tablet (50 mg total) by mouth 2 (two) times daily.    Dispense:  180 tablet    Refill:  1    Supervising Provider:    LONNI SLAIN [[id number]]     Signed, Reche GORMAN Finder, NP  11/04/2024 4:31 PM    [facility]

## 2024-11-04 NOTE — Patient Instructions (Addendum)
 Medication Instructions:  STOP Amlodipine   CHANGE Losartan  to 50mg  (whole tablet) twice per day  This will help to improve your blood pressure and protect your kidney function  We will hold off on Hydrochlorothiazide  at this time. This is a gentle diuretic that could be considered if your blood pressure remains high or if your swelling returns.    Labwork: Your physician recommends that you return for lab work in 2 weeks for BMET. You do not need to fast for this lab.  Please have this collected at [facility] at [place]. The lab is open 8:00 am - 4:30 pm. Please avoid 12:00p - 1:00p for lunch hour. You do not need an appointment. Please go to [address] [place], [place] [address]. This is in the Primary Care office on the 3rd floor, let them know you are there for blood work and they will direct you to the lab.    Follow-Up: Please follow up in 6 months in [facility] with Dr. Raford, Reche Finder, NP or Allean Mink PharmD    Special Instructions:   To prevent or reduce lower extremity swelling: Eat a low salt diet. Salt makes the body hold onto extra fluid which causes swelling. Sit with legs elevated. For example, in the recliner or on an ottoman.  Wear knee-high compression stockings during the daytime. Ones labeled 15-20 mmHg provide good compression.

## 2024-11-18 NOTE — Progress Notes (Signed)
 Office Visit Note  Patient: [name]             Date of Birth: [birth year]           MRN: [id number]             PCP: Geofm Glade PARAS, MD Referring: Geofm Glade PARAS, MD Visit Date: 12/02/2024 Occupation: Data Unavailable  Subjective:  Chronic arthralgias  History of Present Illness: EMMELYN SCHMALE is a 78 y.o. female with history of autoimmune disease.  Patient remains on - Plaquenil  200 mg 1 tablet BID Monday to Friday.  She is tolerating Plaquenil  without any side effects and has not had any gaps in therapy.  Patient continues to have chronic pain and stiffness affecting multiple joints.  Patient states for the past few weeks she has been experiencing increased pain in the right shoulder.  She continues to have ongoing pain and stiffness affecting both hands especially the right hand.  She has underwent discomfort in both knee joints as well as chronic pain affecting both feet.  She remains under the care of Dr. Kit and had a recent MRI of the left foot obtained.  Patient states that she will be restarting physical therapy for Achilles tendinitis and is considering dry needling. She continues to have chronic sicca symptoms.  She uses Restasis twice daily for dry eyes.  She has upcoming appointment with ophthalmology in January 2026. She takes Tylenol and tramadol  as needed for pain relief.  She is on avoid any use of NSAIDs.  She is scheduled to see nephrology tomorrow. She denies any recent or recurrent infections.  She denies any new medical conditions.   Activities of Daily Living:  Patient reports morning stiffness for 2-3 hours.   Patient Reports nocturnal pain.  Difficulty dressing/grooming: Reports Difficulty climbing stairs: Reports Difficulty getting out of chair: Reports Difficulty using hands for taps, buttons, cutlery, and/or writing: Reports  Review of Systems  Constitutional:  Positive for fatigue.  HENT:  Positive for mouth dryness. Negative for mouth  sores.   Eyes:  Positive for dryness.  Respiratory:  Negative for shortness of breath.   Cardiovascular:  Negative for chest pain and palpitations.  Gastrointestinal:  Negative for blood in stool, constipation and diarrhea.  Endocrine: Negative for increased urination.  Genitourinary:  Negative for involuntary urination.  Musculoskeletal:  Positive for joint pain, joint pain, joint swelling, myalgias, muscle weakness, morning stiffness, muscle tenderness and myalgias. Negative for gait problem.  Skin:  Negative for color change, rash, hair loss and sensitivity to sunlight.  Allergic/Immunologic: Negative for susceptible to infections.  Neurological:  Negative for dizziness and headaches.  Hematological:  Negative for swollen glands.  Psychiatric/Behavioral:  Negative for depressed mood and sleep disturbance. The patient is nervous/anxious.     PMFS History:  Patient Active Problem List   Diagnosis Date Noted   Subacute cough 10/08/2024   Fatigue 10/08/2024   Achilles tendinitis of both lower extremities 08/09/2024   Hyperglycemia 07/30/2024   Chronic kidney disease, stage 3b (HCC) 07/30/2024   Exertional dyspnea 06/11/2024   Heel pain, bilateral 05/28/2024   Sleep difficulties 09/10/2023   Chronic right shoulder pain 03/29/2023   Lumbar radiculopathy 09/09/2022   Agatston coronary artery calcium  score of 0,  2023 07/28/2022   Hypertension 07/05/2022   Renal lesion 04/14/2022   B12 deficiency 04/15/2021   COVID-19 04/14/2021   Autoimmune disorder 04/13/2021   At risk for long QT syndrome 01/21/2021   Spinal stenosis, 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/05/2020   Other chest pain 03/10/2020   Shingles 11/19/2019   Pain in both knees 07/09/2019   Right arm pain 02/26/2019   Acute pain of left knee 08/27/2018   Hand arthritis 06/13/2018   Trigger middle finger of right hand 06/13/2018   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   Intermittent palpitations 10/11/2017    Migraine 12/30/2015   Upper airway cough syndrome 10/29/2015   Osteoporosis 06/06/2011   Vitamin D  deficiency 03/22/2010   BREAST CANCER, HX OF 03/22/2010   HYPERLIPIDEMIA 09/28/2007   Fibromyalgia 05/24/2007    Past Medical History:  Diagnosis Date   Arthritis    Breast cancer (HCC)    R lumpectomy ; radiation; no chemotherapy   Bronchitis    recurrent   Bulging lumbar disc    Cataract    multifocal lens implant   Chronic low back pain    bulging disc & spinal stenosis   Coccygeal fracture (HCC) 05/15/2021   COVID-19    COVID-19 02/2023   Cystitis    Exertional dyspnea 06/11/2024   Fibromyalgia    Hyperlipemia    Mitral valve prolapse    Osteoporosis    Personal history of radiation therapy    Pneumonia    as OP X 2; no Pneumovax   Ruptured disc, thoracic    Stage 3 chronic kidney disease (HCC)    Tendinitis of both ankles 05/29/2024   Tendinitis of both feet 05/29/2024   Vitamin D  deficiency    19 in  01/2009    Family History  Problem Relation Age of Onset   Atrial fibrillation Mother        S/P cardioversion   Ulcers Mother    Hypertension Mother    Dementia Mother    Heart attack Father 54       died @ 35   Hypertension Brother    Other Brother        Atrial septum defect   Diabetes Brother    Atrial fibrillation Brother    Peptic Ulcer Brother    Heart attack Paternal Grandmother        < 65   Heart attack Paternal Grandfather        > 55   Thyroid  disease Daughter        on Synthroid since high school   Healthy Son    Heart attack Paternal Aunt        < 65   Heart attack Paternal Uncle        > 55   Stroke Neg Hx    Breast cancer Neg Hx    Past Surgical History:  Procedure Laterality Date   ABDOMINAL HYSTERECTOMY  1990   USO for fibroid , Dr Freya   BREAST LUMPECTOMY Right    BREAST SURGERY  2010   R side lumpectomy; Dr Merrilyn   CATARACT EXTRACTION, BILATERAL  2017   CESAREAN SECTION      G 2 P 2   CYSTECTOMY  2010   EYE SURGERY      eye lid surgery   FOOT SURGERY Right    no colonoscopy     I just don't want to do it. SOC reviewed   Social History   Tobacco Use   Smoking status: Former    Current packs/day: 0.00    Average packs/day: 0.1 packs/day for 6.0 years (0.6 ttl pk-yrs)    Types: Cigarettes    Start date: 12/19/1964    Quit date: 12/19/1970  Years since quitting: 53.9    Passive exposure: Never   Smokeless tobacco: Never   Tobacco comments:    [phone], up to 3 cigarettes / day  Vaping Use   Vaping status: Never Used  Substance Use Topics   Alcohol use: No   Drug use: No   Social History   Social History Narrative   Lives with husband     Immunization History  Administered Date(s) Administered   Fluad Quad(high Dose 65+) 10/02/2019, 09/09/2022   Fluad Trivalent(High Dose 65+) 09/11/2023   PFIZER(Purple Top)SARS-COV-2 Vaccination 01/08/2020, 01/27/2020, 10/17/2020   Td 12/20/1999     Objective: Vital Signs: BP 128/71   Pulse 66   Temp (!) 95.4 F (35.2 C)   Resp 17   Ht 5' 4.5 (1.638 m)   Wt 137 lb (62.1 kg) Comment: patient refused in office, but provided it verbally  BMI 23.15 kg/m    Physical Exam Vitals and nursing note reviewed.  Constitutional:      Appearance: She is well-developed.  HENT:     Head: Normocephalic and atraumatic.  Eyes:     Conjunctiva/sclera: Conjunctivae normal.  Cardiovascular:     Rate and Rhythm: Normal rate and regular rhythm.     Heart sounds: Normal heart sounds.  Pulmonary:     Effort: Pulmonary effort is normal.     Breath sounds: Normal breath sounds.  Abdominal:     General: Bowel sounds are normal.     Palpations: Abdomen is soft.  Musculoskeletal:     Cervical back: Normal range of motion.  Lymphadenopathy:     Cervical: No cervical adenopathy.  Skin:    General: Skin is warm and dry.     Capillary Refill: Capillary refill takes less than 2 seconds.  Neurological:     Mental Status: She is alert and oriented to person,  place, and time.  Psychiatric:        Behavior: Behavior normal.      Musculoskeletal Exam: C-spine, thoracic spine, lumbar spine have good range of motion.  No midline spinal tenderness.  No SI joint tenderness.  Right shoulder has discomfort with ROM and tenderness of the right subacromial bursa.  Left shoulder joint, elbow joints, wrist joints, MCPs, PIPs, DIPs have good range of motion. Tenderness of the right 1st and 2nd MCP joints.  PIP and DIP thickening noted.  Right middle trigger finger.  Complete fist formation bilaterally.  Hip joints have good range of motion with no groin pain.  Tenderness over the trochanteric bursa bilaterally, right greater than left.  Knee joints have good range of motion no warmth or effusion.  Ankle joints have good range of motion no tenderness or joint swelling.  Tenderness and inflammation at the base of the Achilles tendon bilaterally.  Tenderness of the right first MTP and PIP joint. PIP and DIP thickening.    CDAI Exam: CDAI Score: -- Patient Global: --; Provider Global: -- Swollen: --; Tender: -- Joint Exam 12/02/2024   No joint exam has been documented for this visit   There is currently no information documented on the homunculus. Go to the Rheumatology activity and complete the homunculus joint exam.  Investigation: No additional findings.  Imaging: No results found.  Recent Labs: Lab Results  Component Value Date   WBC 4.4 10/02/2024   HGB 12.2 10/02/2024   PLT 261 10/02/2024   NA 141 10/21/2024   K 4.6 10/21/2024   CL 111 (H) 10/21/2024   CO2 23 10/21/2024  GLUCOSE 99 10/21/2024   BUN 23 10/21/2024   CREATININE 1.21 (H) 10/21/2024   BILITOT 0.4 10/21/2024   ALKPHOS 65 03/10/2023   AST 13 10/21/2024   ALT 11 10/21/2024   PROT 6.7 10/21/2024   ALBUMIN 4.7 03/10/2023   CALCIUM  9.0 10/21/2024   GFRAA 58 (L) 05/24/2021   QFTBGOLDPLUS NEGATIVE 06/27/2024    Speciality Comments: PLQ Eye Exam: 05/30/2024 WNL @ [facility] Follow up in 6 months.  Procedures:  No procedures performed Allergies: Albuterol, Morphine, Alendronate, Augmentin [amoxicillin-pot clavulanate], Ciprofloxacin, Fish allergy, Gabapentin , Methotrexate, Prednisone , Risedronate sodium, Cyclobenzaprine , and Ibandronate sodium   Assessment / Plan:     Visit Diagnoses: Autoimmune disease -Positive ANA, dry eyes, hair loss, Raynaud's, inflammatory arthritis: Patient remains on Plaquenil  200 mg 1 tablet by mouth twice daily Monday through Friday.  She is tolerating Plaquenil  without any side effects and has not had any gaps in therapy.  She previously had an intolerance to Imuran , Arava , and methotrexate.  Patient continues to experience chronic pain and stiffness involving multiple joints.  She experiences intermittent inflammation affecting the right hand, both knees, and both feet.  She has tenderness and inflammation of the right second MCP joint.  Synovial thickening of the left knee but no warmth noted today.  She has tenderness and inflammation along the Achilles tendon of both feet and tenderness of the right first MTP and PIP joint.  She has been taking Tylenol and tramadol  as needed for pain relief. Discussed the option of applying for Orencia to help alleviate her symptoms of chronic inflammatory arthritis.  She would like to hold off at this time and would like to remain on Plaquenil  as monotherapy for now. She plans on returning for updated lab work in [facility] orders were placed today.  No medication changes will be made at this time.  She was advised to notify us  if she develops any signs or symptoms of a flare.  She will follow-up in the office in 3 months or sooner if needed.  - Plan: Sedimentation rate, C3 and C4, Comprehensive metabolic panel with GFR, Protein / creatinine ratio, urine, CBC with Differential/Platelet, ANA, Anti-DNA antibody, double-stranded  High risk medication use - Plaquenil  200 mg 1 tablet BID Monday to  Friday.  Intolerance of imuran -nausea and anemia.  Previous treatment: Methotrexate, arava , and imuran .   Could consider the use of Orencia in the future if needed. CBC and CMP updated on 10/02/24.   CMP updated on 10/21/24.   PLQ Eye Exam: 05/30/2024 WNL @ [facility]  - Plan: Comprehensive metabolic panel with GFR, CBC with Differential/Platelet  Chronic right shoulder pain: Patient is experiencing an acute exacerbation of pain affecting the right shoulder.  She was recently performing an exercise activity with her trainer which exacerbated the symptoms.  She has tenderness over the right subacromial bursa on exam.  She will notify us  if the symptoms persist or worsen.  Encouraged the patient to perform shoulder exercises.  Trigger middle finger of right hand: Patient was advised to notify us  if she would like to return for an ultrasound-guided cortisone injection in the future.  Primary osteoarthritis of both hands: She has PIP and DIP thickening consistent with osteoarthritis of both hands.  Tenderness and inflammation noted in the right second MCP joint.  Right middle trigger finger noted.  Primary osteoarthritis of right hip: She has good range of motion on examination today with mild stiffness.  She has tenderness of the right trochanteric bursa  on exam.  Effusion, left knee: She has synovial thickening of the left knee with mild inflammation on examination today.  Could consider the use of Orencia in the future.  Primary osteoarthritis of both feet: Under the care of Dr. Kit.  Recent MRI of the left foot.  Tenderness and inflammation along the Achilles tendon bilaterally.  Patient will be restarting physical therapy.  She is considering dry needling.  She has been experiencing intermittent pain and swelling affecting the right great toe.  She has mild tenderness of the right first MTP joint and PIP joint.  Discussed the importance of wearing proper fitting shoes.  DDD  (degenerative disc disease), cervical - X-rays from March 29, 2023 showed degenerative changes and posterior osteophytes at C6- C7.  Evaluated by Dr. Beuford in the past. Chronic pain.  Lumbar spondylosis - Degenerative changes seen most pronounced at L4-L5 L5-S1 from MRI of the lumbar on March 2023. Manageable.  Age-related osteoporosis without current pathological fracture - DEXA updated on 09/12/2023: LFN T-score -2.4, RFN T-score -2.3, lumbarT-score -1.6.  Previous DEXA 04/23/21: LFN T score -2.7.  Intolerance to Fosamax,Boniva. DEXA due in September 2026.  Vitamin D  deficiency: She is taking vitamin D  1000 units daily.  Closed fracture of coccyx with routine healing, subsequent encounter  Fibromyalgia: She continues to experience intermittent myalgias and muscle tenderness due to fibromyalgia.  She has been trying to remain active.  Other fatigue: Chronic, stable.  Other medical conditions are listed as follows:  Primary hypertension: Blood pressure was 128/71 today in the office.  History of shingles  History of breast cancer  Vitamin B12 deficiency  Hx of migraines  Orders: Orders Placed This Encounter  Procedures   Sedimentation rate   C3 and C4   Comprehensive metabolic panel with GFR   Protein / creatinine ratio, urine   CBC with Differential/Platelet   ANA   Anti-DNA antibody, double-stranded   No orders of the defined types were placed in this encounter.    Follow-Up Instructions: Return in about 3 months (around 03/02/2025) for Autoimmune Disease.   Waddell CHRISTELLA Craze, PA-C  Note - This record has been created using Dragon software.  Chart creation errors have been sought, but may not always  have been located. Such creation errors do not reflect on  the standard of medical care.

## 2024-12-01 ENCOUNTER — Other Ambulatory Visit: Payer: Self-pay | Admitting: Physician Assistant

## 2024-12-02 ENCOUNTER — Encounter: Payer: Self-pay | Admitting: Physician Assistant

## 2024-12-02 ENCOUNTER — Ambulatory Visit: Attending: Physician Assistant | Admitting: Physician Assistant

## 2024-12-02 VITALS — BP 128/71 | HR 66 | Temp 95.4°F | Resp 17 | Ht 64.5 in | Wt 137.0 lb

## 2024-12-02 DIAGNOSIS — M5030 Other cervical disc degeneration, unspecified cervical region: Secondary | ICD-10-CM

## 2024-12-02 DIAGNOSIS — M19071 Primary osteoarthritis, right ankle and foot: Secondary | ICD-10-CM

## 2024-12-02 DIAGNOSIS — M19072 Primary osteoarthritis, left ankle and foot: Secondary | ICD-10-CM

## 2024-12-02 DIAGNOSIS — Z8619 Personal history of other infectious and parasitic diseases: Secondary | ICD-10-CM

## 2024-12-02 DIAGNOSIS — M47816 Spondylosis without myelopathy or radiculopathy, lumbar region: Secondary | ICD-10-CM | POA: Diagnosis not present

## 2024-12-02 DIAGNOSIS — M1611 Unilateral primary osteoarthritis, right hip: Secondary | ICD-10-CM | POA: Diagnosis not present

## 2024-12-02 DIAGNOSIS — M19041 Primary osteoarthritis, right hand: Secondary | ICD-10-CM | POA: Diagnosis present

## 2024-12-02 DIAGNOSIS — Z79899 Other long term (current) drug therapy: Secondary | ICD-10-CM

## 2024-12-02 DIAGNOSIS — M19042 Primary osteoarthritis, left hand: Secondary | ICD-10-CM | POA: Insufficient documentation

## 2024-12-02 DIAGNOSIS — G8929 Other chronic pain: Secondary | ICD-10-CM

## 2024-12-02 DIAGNOSIS — R5383 Other fatigue: Secondary | ICD-10-CM

## 2024-12-02 DIAGNOSIS — M25462 Effusion, left knee: Secondary | ICD-10-CM | POA: Diagnosis not present

## 2024-12-02 DIAGNOSIS — M810 Age-related osteoporosis without current pathological fracture: Secondary | ICD-10-CM | POA: Diagnosis present

## 2024-12-02 DIAGNOSIS — M797 Fibromyalgia: Secondary | ICD-10-CM | POA: Insufficient documentation

## 2024-12-02 DIAGNOSIS — M359 Systemic involvement of connective tissue, unspecified: Secondary | ICD-10-CM

## 2024-12-02 DIAGNOSIS — M25511 Pain in right shoulder: Secondary | ICD-10-CM | POA: Diagnosis not present

## 2024-12-02 DIAGNOSIS — Z8669 Personal history of other diseases of the nervous system and sense organs: Secondary | ICD-10-CM | POA: Diagnosis present

## 2024-12-02 DIAGNOSIS — E559 Vitamin D deficiency, unspecified: Secondary | ICD-10-CM

## 2024-12-02 DIAGNOSIS — E538 Deficiency of other specified B group vitamins: Secondary | ICD-10-CM | POA: Insufficient documentation

## 2024-12-02 DIAGNOSIS — M65331 Trigger finger, right middle finger: Secondary | ICD-10-CM | POA: Diagnosis not present

## 2024-12-02 DIAGNOSIS — I10 Essential (primary) hypertension: Secondary | ICD-10-CM | POA: Diagnosis present

## 2024-12-02 DIAGNOSIS — Z853 Personal history of malignant neoplasm of breast: Secondary | ICD-10-CM

## 2024-12-02 DIAGNOSIS — S322XXD Fracture of coccyx, subsequent encounter for fracture with routine healing: Secondary | ICD-10-CM | POA: Diagnosis present

## 2024-12-02 NOTE — Telephone Encounter (Signed)
 Last Fill: 09/09/2024  Eye exam: 05/30/2024 WNL   Labs: 10/21/2024 LFTs have returned to [facility] news!  Creatinine remains elevated and continue to trend up-1.21 and GFR is low and continues to trend down-46  10/02/2024 CBC WNL  Next Visit: 12/02/2024  Last Visit: 09/02/2024  IK:Jlunpfflwz disease    Current Dose per office note 09/02/2024: Plaquenil  200 mg 1 tablet BID Monday to Friday.   Okay to refill Plaquenil ?

## 2024-12-02 NOTE — Patient Instructions (Signed)
 Standing Labs We placed an order today for your standing lab work.   Please have your standing labs drawn in January and every 3 months   Please have your labs drawn 2 weeks prior to your appointment so that the provider can discuss your lab results at your appointment, if possible.  Please note that you may see your imaging and lab results in MyChart before we have reviewed them. We will contact you once all results are reviewed. Please allow our office up to 72 hours to thoroughly review all of the results before contacting the office for clarification of your results.  WALK-IN LAB HOURS  Monday through Thursday from 8:00 am - 4:30 pm and Friday from 8:00 am-12:00 pm.  Patients with office visits requiring labs will be seen before walk-in labs.  You may encounter longer than normal wait times. Please allow additional time. Wait times may be shorter on  Monday and Thursday afternoons.  We do not book appointments for walk-in labs. We appreciate your patience and understanding with our staff.   Labs are drawn by [facility]. Please bring your co-pay at the time of your lab draw.  You may receive a bill from [facility] for your lab work.  Please note if you are on Hydroxychloroquine  and and an order has been placed for a Hydroxychloroquine  level,  you will need to have it drawn 4 hours or more after your last dose.  If you wish to have your labs drawn at another location, please call the office 24 hours in advance so we can fax the orders.  The office is located at [address], [place], [place] [address]   If you have any questions regarding directions or hours of operation,  please call [phone].   As a reminder, please drink plenty of water  prior to coming for your lab work. Thanks!

## 2024-12-23 ENCOUNTER — Telehealth (HOSPITAL_BASED_OUTPATIENT_CLINIC_OR_DEPARTMENT_OTHER): Payer: Self-pay | Admitting: Cardiovascular Disease

## 2024-12-23 NOTE — Telephone Encounter (Signed)
" °*  STAT* If patient is at the pharmacy, call can be transferred to refill team.   1. Which medications need to be refilled? (please list name of each medication and dose if known)   losartan  (COZAAR ) 50 MG tablet   2. Would you like to learn more about the convenience, safety, & potential cost savings by using the [facility]?   3. Are you open to using the [facility] (Type [facility]. ).  4. Which pharmacy/location (including street and city if local pharmacy) is medication to be sent to?  [id number] - [place], [place] - [address] AT [facility] [address]   5. Do they need a 30 day or 90 day supply?   90 day  Patient stated she will be out of this medication tomorrow (1/6).  Patient stated her medication was increased to 1 tablet in the morning and 1 tablet in the evening and will need new prescription sent to her pharmacy.  "

## 2024-12-25 LAB — OPHTHALMOLOGY REPORT-SCANNED: A Comment: NORMAL

## 2024-12-26 ENCOUNTER — Other Ambulatory Visit: Payer: Self-pay

## 2024-12-26 DIAGNOSIS — I10 Essential (primary) hypertension: Secondary | ICD-10-CM

## 2024-12-26 MED ORDER — LOSARTAN POTASSIUM 50 MG PO TABS: 50.0000 mg | ORAL_TABLET | Freq: Two times a day (BID) | ORAL | 3 refills | Status: AC

## 2024-12-26 NOTE — Telephone Encounter (Signed)
 Pt requested refill on losartan  50 mg BID. Refill sent to preferred pharmacy.

## 2025-01-01 ENCOUNTER — Ambulatory Visit

## 2025-01-01 DIAGNOSIS — Z23 Encounter for immunization: Secondary | ICD-10-CM

## 2025-01-01 NOTE — Progress Notes (Signed)
 Pt was given HD flu vaccine w/o any complications at this time.

## 2025-01-07 ENCOUNTER — Ambulatory Visit

## 2025-03-03 ENCOUNTER — Ambulatory Visit: Admitting: Physician Assistant

## 2025-06-24 ENCOUNTER — Ambulatory Visit
